# Patient Record
Sex: Female | Born: 1945
Health system: Southern US, Community
[De-identification: ages and names within clinical notes are randomized; demographics above are authoritative.]

## PROBLEM LIST (undated history)

## (undated) DIAGNOSIS — I10 Essential (primary) hypertension: Secondary | ICD-10-CM

## (undated) DIAGNOSIS — K219 Gastro-esophageal reflux disease without esophagitis: Secondary | ICD-10-CM

## (undated) DIAGNOSIS — N289 Disorder of kidney and ureter, unspecified: Secondary | ICD-10-CM

## (undated) DIAGNOSIS — H16229 Keratoconjunctivitis sicca, 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's, unspecified eye: Secondary | ICD-10-CM

## (undated) DIAGNOSIS — M899 Disorder of bone, unspecified: Secondary | ICD-10-CM

## (undated) DIAGNOSIS — Z8719 Personal history of other diseases of the digestive system: Secondary | ICD-10-CM

## (undated) DIAGNOSIS — M542 Cervicalgia: Secondary | ICD-10-CM

## (undated) DIAGNOSIS — E785 Hyperlipidemia, unspecified: Secondary | ICD-10-CM

## (undated) DIAGNOSIS — H409 Unspecified glaucoma: Secondary | ICD-10-CM

## (undated) DIAGNOSIS — Z9289 Personal history of other medical treatment: Secondary | ICD-10-CM

## (undated) DIAGNOSIS — J329 Chronic sinusitis, unspecified: Secondary | ICD-10-CM

## (undated) DIAGNOSIS — Z0000 Encounter for general adult medical examination without abnormal findings: Secondary | ICD-10-CM

## (undated) DIAGNOSIS — M719 Bursopathy, unspecified: Secondary | ICD-10-CM

## (undated) DIAGNOSIS — R739 Hyperglycemia, unspecified: Secondary | ICD-10-CM

## (undated) DIAGNOSIS — R7303 Prediabetes: Secondary | ICD-10-CM

## (undated) DIAGNOSIS — K5730 Diverticulosis of large intestine without perforation or abscess without bleeding: Secondary | ICD-10-CM

## (undated) DIAGNOSIS — J301 Allergic rhinitis due to pollen: Secondary | ICD-10-CM

## (undated) DIAGNOSIS — Z9889 Other specified postprocedural states: Secondary | ICD-10-CM

## (undated) DIAGNOSIS — K449 Diaphragmatic hernia without obstruction or gangrene: Secondary | ICD-10-CM

## (undated) DIAGNOSIS — J069 Acute upper respiratory infection, unspecified: Secondary | ICD-10-CM

## (undated) DIAGNOSIS — Z79899 Other long term (current) drug therapy: Secondary | ICD-10-CM

## (undated) DIAGNOSIS — M2550 Pain in unspecified joint: Secondary | ICD-10-CM

## (undated) DIAGNOSIS — M3500 Sicca syndrome, unspecified: Secondary | ICD-10-CM

## (undated) DIAGNOSIS — M47812 Spondylosis without myelopathy or radiculopathy, cervical region: Secondary | ICD-10-CM

## (undated) DIAGNOSIS — M949 Disorder of cartilage, unspecified: Secondary | ICD-10-CM

## (undated) DIAGNOSIS — M1990 Unspecified osteoarthritis, unspecified site: Secondary | ICD-10-CM

## (undated) DIAGNOSIS — H04129 Dry eye syndrome of unspecified lacrimal gland: Secondary | ICD-10-CM

## (undated) HISTORY — DX: Encounter for general adult medical examination without abnormal findings: Z00.00

## (undated) HISTORY — DX: Other specified postprocedural states: Z98.890

## (undated) HISTORY — DX: Other long term (current) drug therapy: Z79.899

## (undated) HISTORY — DX: Unspecified glaucoma: H40.9

## (undated) HISTORY — DX: Sicca syndrome, unspecified: M35.00

## (undated) HISTORY — PX: BELPHAROPTOSIS REPAIR: SHX369

## (undated) HISTORY — PX: DILATION AND CURETTAGE OF UTERUS: SHX78

## (undated) HISTORY — DX: Hyperglycemia, unspecified: R73.9

## (undated) HISTORY — DX: Personal history of other medical treatment: Z92.89

## (undated) HISTORY — DX: Hyperlipidemia, unspecified: E78.5

## (undated) HISTORY — DX: Bursopathy, unspecified: M71.9

## (undated) HISTORY — PX: BACK SURGERY: SHX140

## (undated) HISTORY — PX: GALLBLADDER SURGERY: SHX652

## (undated) HISTORY — DX: Disorder of bone, unspecified: M94.9

## (undated) HISTORY — DX: Dry eye syndrome of unspecified lacrimal gland: H04.129

## (undated) HISTORY — DX: Keratoconjunctivitis sicca, 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's, unspecified eye: H16.229

## (undated) HISTORY — DX: Disorder of bone, unspecified: M89.9

## (undated) HISTORY — DX: Essential (primary) hypertension: I10

## (undated) HISTORY — DX: Diaphragmatic hernia without obstruction or gangrene: K44.9

## (undated) HISTORY — DX: Chronic sinusitis, unspecified: J32.9

## (undated) HISTORY — DX: Diverticulosis of large intestine without perforation or abscess without bleeding: K57.30

## (undated) HISTORY — DX: Unspecified osteoarthritis, unspecified site: M19.90

## (undated) HISTORY — DX: Acute upper respiratory infection, unspecified: J06.9

## (undated) HISTORY — DX: Cervicalgia: M54.2

## (undated) HISTORY — DX: Spondylosis without myelopathy or radiculopathy, cervical region: M47.812

## (undated) HISTORY — DX: Allergic rhinitis due to pollen: J30.1

## (undated) HISTORY — DX: Disorder of kidney and ureter, unspecified: N28.9

## (undated) HISTORY — DX: Pain in unspecified joint: M25.50

## (undated) HISTORY — PX: NASAL SEPTUM SURGERY: SHX37

---

## 1991-04-13 HISTORY — PX: CHOLECYSTECTOMY: SHX55

## 1997-08-29 ENCOUNTER — Other Ambulatory Visit: Admission: RE | Admit: 1997-08-29 | Discharge: 1997-08-29 | Payer: Self-pay | Admitting: Obstetrics and Gynecology

## 1998-09-01 ENCOUNTER — Other Ambulatory Visit: Admission: RE | Admit: 1998-09-01 | Discharge: 1998-09-01 | Payer: Self-pay | Admitting: Obstetrics and Gynecology

## 1998-09-11 ENCOUNTER — Other Ambulatory Visit: Admission: RE | Admit: 1998-09-11 | Discharge: 1998-09-11 | Payer: Self-pay | Admitting: Obstetrics & Gynecology

## 2000-08-19 ENCOUNTER — Encounter: Admission: RE | Admit: 2000-08-19 | Discharge: 2000-08-19 | Payer: Self-pay | Admitting: Obstetrics & Gynecology

## 2000-08-19 ENCOUNTER — Encounter: Payer: Self-pay | Admitting: Obstetrics & Gynecology

## 2001-09-05 ENCOUNTER — Other Ambulatory Visit: Admission: RE | Admit: 2001-09-05 | Discharge: 2001-09-05 | Payer: Self-pay | Admitting: Obstetrics and Gynecology

## 2002-09-12 ENCOUNTER — Other Ambulatory Visit: Admission: RE | Admit: 2002-09-12 | Discharge: 2002-09-12 | Payer: Self-pay | Admitting: Obstetrics and Gynecology

## 2003-02-26 ENCOUNTER — Encounter: Admission: RE | Admit: 2003-02-26 | Discharge: 2003-02-26 | Payer: Self-pay | Admitting: Obstetrics and Gynecology

## 2004-01-29 ENCOUNTER — Encounter: Admission: RE | Admit: 2004-01-29 | Discharge: 2004-01-29 | Payer: Self-pay | Admitting: Obstetrics & Gynecology

## 2006-07-11 ENCOUNTER — Ambulatory Visit: Payer: Self-pay | Admitting: Gastroenterology

## 2006-07-25 ENCOUNTER — Ambulatory Visit: Payer: Self-pay | Admitting: Gastroenterology

## 2007-05-11 ENCOUNTER — Encounter: Admission: RE | Admit: 2007-05-11 | Discharge: 2007-05-11 | Payer: Self-pay | Admitting: *Deleted

## 2008-10-06 ENCOUNTER — Encounter: Admission: RE | Admit: 2008-10-06 | Discharge: 2008-10-06 | Payer: Self-pay | Admitting: Internal Medicine

## 2010-05-02 ENCOUNTER — Encounter: Payer: Self-pay | Admitting: Obstetrics and Gynecology

## 2011-02-01 ENCOUNTER — Encounter (INDEPENDENT_AMBULATORY_CARE_PROVIDER_SITE_OTHER): Payer: Medicare Other | Admitting: Ophthalmology

## 2011-02-01 DIAGNOSIS — H43819 Vitreous degeneration, unspecified eye: Secondary | ICD-10-CM

## 2011-02-01 DIAGNOSIS — H4420 Degenerative myopia, unspecified eye: Secondary | ICD-10-CM

## 2011-02-01 DIAGNOSIS — H47219 Primary optic atrophy, unspecified eye: Secondary | ICD-10-CM

## 2011-02-01 DIAGNOSIS — Z09 Encounter for follow-up examination after completed treatment for conditions other than malignant neoplasm: Secondary | ICD-10-CM

## 2011-06-15 ENCOUNTER — Encounter: Payer: Self-pay | Admitting: Gastroenterology

## 2012-06-20 ENCOUNTER — Other Ambulatory Visit (HOSPITAL_COMMUNITY): Payer: Self-pay | Admitting: Obstetrics and Gynecology

## 2012-06-20 DIAGNOSIS — Z780 Asymptomatic menopausal state: Secondary | ICD-10-CM

## 2012-06-26 ENCOUNTER — Other Ambulatory Visit (HOSPITAL_COMMUNITY): Payer: Self-pay | Admitting: Obstetrics and Gynecology

## 2012-06-26 ENCOUNTER — Ambulatory Visit (HOSPITAL_COMMUNITY)
Admission: RE | Admit: 2012-06-26 | Discharge: 2012-06-26 | Disposition: A | Discharge status: Home / Self Care | Payer: Medicare Other | Source: Ambulatory Visit | Attending: Obstetrics and Gynecology | Admitting: Obstetrics and Gynecology

## 2012-06-26 DIAGNOSIS — Z13820 Encounter for screening for osteoporosis: Secondary | ICD-10-CM | POA: Insufficient documentation

## 2012-06-26 DIAGNOSIS — Z780 Asymptomatic menopausal state: Secondary | ICD-10-CM | POA: Insufficient documentation

## 2012-07-04 ENCOUNTER — Other Ambulatory Visit: Payer: Self-pay | Admitting: *Deleted

## 2012-07-04 DIAGNOSIS — I10 Essential (primary) hypertension: Secondary | ICD-10-CM

## 2012-07-04 DIAGNOSIS — E785 Hyperlipidemia, unspecified: Secondary | ICD-10-CM

## 2012-07-04 DIAGNOSIS — H04129 Dry eye syndrome of unspecified lacrimal gland: Secondary | ICD-10-CM

## 2012-07-17 ENCOUNTER — Encounter: Payer: Self-pay | Admitting: Gastroenterology

## 2012-07-18 ENCOUNTER — Other Ambulatory Visit: Payer: Self-pay

## 2012-07-18 DIAGNOSIS — I10 Essential (primary) hypertension: Secondary | ICD-10-CM

## 2012-07-18 DIAGNOSIS — E785 Hyperlipidemia, unspecified: Secondary | ICD-10-CM

## 2012-07-18 DIAGNOSIS — H04129 Dry eye syndrome of unspecified lacrimal gland: Secondary | ICD-10-CM

## 2012-07-19 LAB — BASIC METABOLIC PANEL
BUN/Creatinine Ratio: 13 (ref 11–26)
BUN: 11 mg/dL (ref 8–27)
CO2: 25 mmol/L (ref 19–28)
Calcium: 9.8 mg/dL (ref 8.6–10.2)
Chloride: 103 mmol/L (ref 97–108)
Creatinine, Ser: 0.87 mg/dL (ref 0.57–1.00)
GFR calc Af Amer: 80 mL/min/{1.73_m2} (ref >59)
GFR calc non Af Amer: 70 mL/min/{1.73_m2} (ref >59)
Glucose: 89 mg/dL (ref 65–99)
Potassium: 4.2 mmol/L (ref 3.5–5.2)
Sodium: 142 mmol/L (ref 134–144)

## 2012-07-19 LAB — CBC WITH DIFFERENTIAL/PLATELET
Basophils Absolute: 0.1 10*3/uL (ref 0.0–0.2)
Basos: 2 % (ref 0–3)
Eos: 7 % — ABNORMAL HIGH (ref 0–5)
Eosinophils Absolute: 0.5 10*3/uL — ABNORMAL HIGH (ref 0.0–0.4)
HCT: 43.1 % (ref 34.0–46.6)
Hemoglobin: 14.4 g/dL (ref 11.1–15.9)
Immature Grans (Abs): 0 10*3/uL (ref 0.0–0.1)
Immature Granulocytes: 0 % (ref 0–2)
Lymphocytes Absolute: 2 10*3/uL (ref 0.7–3.1)
Lymphs: 28 % (ref 14–46)
MCH: 30.6 pg (ref 26.6–33.0)
MCHC: 33.4 g/dL (ref 31.5–35.7)
MCV: 92 fL (ref 79–97)
Monocytes Absolute: 0.5 10*3/uL (ref 0.1–0.9)
Monocytes: 7 % (ref 4–12)
Neutrophils Absolute: 4.2 10*3/uL (ref 1.4–7.0)
Neutrophils Relative %: 56 % (ref 40–74)
RBC: 4.7 x10E6/uL (ref 3.77–5.28)
RDW: 13.2 % (ref 12.3–15.4)
WBC: 7.4 10*3/uL (ref 3.4–10.8)

## 2012-07-19 LAB — LIPID PANEL
Chol/HDL Ratio: 5.4 ratio units — ABNORMAL HIGH (ref 0.0–4.4)
Cholesterol, Total: 237 mg/dL — ABNORMAL HIGH (ref 100–199)
HDL: 44 mg/dL (ref >39)
LDL Calculated: 158 mg/dL — ABNORMAL HIGH (ref 0–99)
Triglycerides: 177 mg/dL — ABNORMAL HIGH (ref 0–149)
VLDL Cholesterol Cal: 35 mg/dL (ref 5–40)

## 2012-07-20 ENCOUNTER — Encounter: Payer: Self-pay | Admitting: Geriatric Medicine

## 2012-07-20 ENCOUNTER — Encounter (INDEPENDENT_AMBULATORY_CARE_PROVIDER_SITE_OTHER): Payer: Medicare Other | Admitting: Internal Medicine

## 2012-07-20 ENCOUNTER — Encounter: Payer: Self-pay | Admitting: Internal Medicine

## 2012-07-24 ENCOUNTER — Encounter: Payer: Self-pay | Admitting: Internal Medicine

## 2012-07-24 ENCOUNTER — Ambulatory Visit (INDEPENDENT_AMBULATORY_CARE_PROVIDER_SITE_OTHER): Payer: Medicare Other | Admitting: Internal Medicine

## 2012-07-24 VITALS — BP 172/88 | HR 81 | Temp 97.8°F | Resp 16 | Ht 67.0 in | Wt 181.4 lb

## 2012-07-24 DIAGNOSIS — M629 Disorder of muscle, unspecified: Secondary | ICD-10-CM

## 2012-07-24 DIAGNOSIS — M899 Disorder of bone, unspecified: Secondary | ICD-10-CM

## 2012-07-24 DIAGNOSIS — H16223 Keratoconjunctivitis sicca, 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's, bilateral: Secondary | ICD-10-CM

## 2012-07-24 DIAGNOSIS — H409 Unspecified glaucoma: Secondary | ICD-10-CM

## 2012-07-24 DIAGNOSIS — M8580 Other specified disorders of bone density and structure, unspecified site: Secondary | ICD-10-CM

## 2012-07-24 DIAGNOSIS — Z79899 Other long term (current) drug therapy: Secondary | ICD-10-CM

## 2012-07-24 DIAGNOSIS — M949 Disorder of cartilage, unspecified: Secondary | ICD-10-CM

## 2012-07-24 DIAGNOSIS — M7632 Iliotibial band syndrome, left leg: Secondary | ICD-10-CM

## 2012-07-24 DIAGNOSIS — E785 Hyperlipidemia, unspecified: Secondary | ICD-10-CM

## 2012-07-24 DIAGNOSIS — H16229 Keratoconjunctivitis sicca, 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's, unspecified eye: Secondary | ICD-10-CM

## 2012-07-24 DIAGNOSIS — M47812 Spondylosis without myelopathy or radiculopathy, cervical region: Secondary | ICD-10-CM

## 2012-07-24 DIAGNOSIS — J301 Allergic rhinitis due to pollen: Secondary | ICD-10-CM

## 2012-07-24 DIAGNOSIS — M3500 Sicca syndrome, unspecified: Secondary | ICD-10-CM

## 2012-07-24 DIAGNOSIS — I10 Essential (primary) hypertension: Secondary | ICD-10-CM

## 2012-07-24 MED ORDER — LOSARTAN POTASSIUM 50 MG PO TABS: 50.0000 mg | ORAL_TABLET | Freq: Every day | ORAL | Status: DC

## 2012-07-24 NOTE — Patient Instructions (Addendum)
Please write down twice weekly blood pressures after taking medications and bring with you to next visit.    Encourage exercise and healthy diet.    Continue glucosamine.

## 2012-07-24 NOTE — Progress Notes (Signed)
Patient ID: Felicia Hall, female   DOB: Jun 19, 1945, 67 y.o.   MRN: [id number] Code Status: need to discuss next visit  Allergies  Allergen Reactions  . Darvocet (Propoxyphene-Acetaminophen)   . Statins     Chief Complaint  Patient presents with  . Medical Managment of Chronic Issues    HPI: Patient is a 67 y.o. Caucasian female seen in the office today for follow up of high cholesterol.    Stopped statin and fenofibrate because of weakness about 1 month ago.  Also interacted with plaquenil.  Taking cholestoff (sp?) for about a month.  Hips and legs painful.  Dr. Corliss Skains said may be bursitis.  Has been going to neuromuscular massage therapist.  Hip flexors have shortened and working on that has helped so easier and less painful to walk.    Says diet could be better.  Tries to avoid really high risk foods like burgers, fatty, sweet, cholesterol items.  Is officially retired.  Had a very inactive winter.    Does use heat and ice for joints.  Also used a topical gel (suspect voltaren) w/ benefit but irritated skin.  Cannot take steroids with glaucoma.    Review of Systems:  Review of Systems  Constitutional: Positive for malaise/fatigue. Negative for fever and chills.  HENT: Negative for congestion.   Eyes:       Glaucoma  Respiratory: Negative for shortness of breath.   Cardiovascular: Negative for chest pain.  Gastrointestinal: Negative for heartburn and abdominal pain.  Genitourinary: Negative for dysuria.  Musculoskeletal: Positive for myalgias and joint pain. Negative for falls.  Skin: Negative for rash.  Neurological: Positive for weakness. Negative for dizziness and headaches.  Endo/Heme/Allergies: Negative for polydipsia.  Psychiatric/Behavioral: Positive for depression. Negative for memory loss.       Denies, but mood seems worse since retirement    Past Medical History  Diagnosis Date  . 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 Encounter for  long-term (current) use of other medications   . Routine general medical examination at a health care facility   . Tear film insufficiency, unspecified   . Unspecified disorder of kidney and ureter   . Cervical spondylosis without myelopathy   . Cervicalgia   . Disorder of bone and cartilage, unspecified   . Acute upper respiratory infections of unspecified site   . Allergic rhinitis due to pollen   . Other and unspecified hyperlipidemia   . 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 Benign essential hypertension   . Diaphragmatic hernia without mention of obstruction or gangrene   . Diverticulosis of colon (without mention of hemorrhage)   . Pain in joint, site unspecified   . Hyperlipidemia LDL goal < 100    Past Surgical History  Procedure Laterality Date  . Cholecystectomy  1993   Social History:   reports that she has never smoked. She does not have any smokeless tobacco history on file. She reports that she does not drink alcohol or use illicit drugs.  Family History  Problem Relation Age of Onset  . Heart attack Mother   . Pancreatitis Father     Medications: Patient's Medications  New Prescriptions   No medications on file  Previous Medications   ASPIRIN 81 MG TABLET    Take one tablet once daily   CALCIUM-VITAMIN D (OSCAL WITH D) 500-200 MG-UNIT PER TABLET    Take one tablet once daily for bones   CHOLECALCIFEROL (VITAMIN D) 1000 UNITS TABLET    Take  one tablet once daily   CHOLINE FENOFIBRATE (FENOFIBRIC ACID) 135 MG CPDR       CO-ENZYME Q-10 100 MG CAPS    Take by mouth. Take one tablet once daily   HYDROXYCHLOROQUINE (PLAQUENIL) 200 MG TABLET    Take by mouth daily. Take one tablet twice daily   MISC NATURAL PRODUCTS (OSTEO BI-FLEX JOINT SHIELD PO)    Take by mouth. Take one tablet twice daily   MULTIPLE VITAMINS-MINERALS (CENTRUM SILVER PO)    Take by mouth. Take one tablet once daily   PLANT STEROLS AND STANOLS (CHOLEST OFF PO)    Take by mouth. Take one tablet four  times daily for cholesterol   TIMOLOL (BETIMOL) 0.5 % OPHTHALMIC SOLUTION    One drop each eye once daily for glaucoma   TIMOLOL (TIMOPTIC-XR) 0.5 % OPHTHALMIC GEL-FORMING      Modified Medications   Modified Medication Previous Medication   LOSARTAN (COZAAR) 50 MG TABLET losartan (COZAAR) 50 MG tablet      Take 1 tablet (50 mg total) by mouth daily.    Take 50 mg by mouth daily.  Discontinued Medications   No medications on file   Physical Exam: usually bp 120s/60s except 133/78 at home Filed Vitals:   07/24/12 1405  BP: 172/88  Pulse: 81  Temp: 97.8 F (36.6 C)  TempSrc: Oral  Resp: 16  Height: 5\' 7"  (1.702 m)  Weight: 181 lb 6.4 oz (82.283 kg)  Physical Exam  Constitutional: She is oriented to person, place, and time. She appears well-developed and well-nourished. No distress.  Caucasian female  HENT:  Head: Normocephalic and atraumatic.  Eyes: Pupils are equal, round, and reactive to light.  Cardiovascular: Normal rate, regular rhythm, normal heart sounds and intact distal pulses.   Pulmonary/Chest: Effort normal and breath sounds normal. No respiratory distress.  Abdominal: Soft. Bowel sounds are normal. She exhibits no distension and no mass. There is no tenderness.  Musculoskeletal: Normal range of motion. She exhibits no tenderness.  Neurological: She is alert and oriented to person, place, and time.  Skin: Skin is warm and dry.  Psychiatric:  Somewhat flat affect   Labs reviewed: 02/10/2010  CBC: wbc 6.9, rbc 4.42, Hemoglobin 13.5 CMP: glucose 90, BUN 18, Creatinine 1.00 LIpid: Cholesterol 192, Triglycerides 150, HDL 40, LDL 122 07/19/2011 CMP Glucose 87 Bun 16 Creatinine 0.98  Vitamin D 25 Hydroxy 37.3  01/18/2012  BMP: glucose 81, BUN 12, Creatinine 1.00 Lipid: Cholesterol 189, Triglycerides 128, HDL 44, LDL 119 Basic Metabolic Panel:  Recent Labs  91/47/82 0826  NA 142  K 4.2  CL 103  CO2 25  GLUCOSE 89  BUN 11  CREATININE 0.87  CALCIUM 9.8   CBC:  Recent Labs  07/18/12 0826  WBC 7.4  NEUTROABS 4.2  HGB 14.4  HCT 43.1  MCV 92   Lipid Panel:  Recent Labs  07/18/12 0826  HDL 44  LDLCALC 158*  TRIG 177*  CHOLHDL 5.4*   Past Procedures: 2008 Left Breast Ultrasound Normal  03/14/2007 Mammogram Normal  2008 Pap Smear Normal done @ [facility]  05/11/2007 Bone Density Normal  10/06/2008 MRI Cervical spine-degenerative cervical spondylosis with disc disease and facet disease; shallow central disc protrusion and C3-4; disc disease at C4-5 and C5-6 with spinal stenosis, disc protrusion at T1-2 2008 colonoscope[+ve family h/o colon polyps ]--nl 10/2010 Mammogram Normal   colonoscopy   Dr Claudette Head every 5 years- due to family history of colon polyps   04/2011  Pap smear Normal   by  Debbora Dus NP   Assessment/Plan Benign essential hypertension BP high.  Always upset about waiting.  Is not adherent with diet and has been unable to exercise much due to statin-induced myopathy which she also experienced with fenofibrate so this has been stopped.  Advised to monitor bp outpatient, work on diet and exercise.  Cont losartan and baby asa.  Hyperlipidemia LDL goal < 100 LDL very high, TG also very high.  Did not tolerate statin or fenofibrate.  Supplement she is taking seems to be ineffective so she will stop it, as well.  Encouraged diet and exercise.  I can't imagine she will tolerate niacin and she is hesitant to try anything right now.  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  Limits her medication options for arthritis treatment.    Cervical spondylosis without myelopathy Has chronic neck pain.  On plaquenil and gets regular labs through rheumatology, as well.  Due for labs before her next visit with me.  Allergic rhinitis due to pollen Has been avoiding exposure as much as possible.  Stable at this point  Disorder of bone and cartilage, unspecified Gets her bone densities and mammograms through  her gyn.  Last was 3/14.  Is taking calcium with vitamin D  Keratoconjunctivitis sicca, 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's Drinks a lot of water.  Has tried biotene mouthwashes and gels, but they do not offer much help to her dry mouth.  Eyes chronically dry.  Also follows with rheumatology for this.   Labs/tests ordered:  Flp, bmp, vit D, cbc before f/u appt in 3 mos

## 2012-08-13 ENCOUNTER — Encounter: Payer: Self-pay | Admitting: Internal Medicine

## 2012-08-13 DIAGNOSIS — I10 Essential (primary) hypertension: Secondary | ICD-10-CM | POA: Insufficient documentation

## 2012-08-13 DIAGNOSIS — E782 Mixed hyperlipidemia: Secondary | ICD-10-CM | POA: Insufficient documentation

## 2012-08-13 DIAGNOSIS — H16229 Keratoconjunctivitis sicca, 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's, unspecified eye: Secondary | ICD-10-CM | POA: Insufficient documentation

## 2012-08-13 DIAGNOSIS — M899 Disorder of bone, unspecified: Secondary | ICD-10-CM | POA: Insufficient documentation

## 2012-08-13 DIAGNOSIS — H409 Unspecified glaucoma: Secondary | ICD-10-CM | POA: Insufficient documentation

## 2012-08-13 DIAGNOSIS — M949 Disorder of cartilage, unspecified: Secondary | ICD-10-CM | POA: Insufficient documentation

## 2012-08-13 DIAGNOSIS — J301 Allergic rhinitis due to pollen: Secondary | ICD-10-CM | POA: Insufficient documentation

## 2012-08-13 DIAGNOSIS — Z79899 Other long term (current) drug therapy: Secondary | ICD-10-CM | POA: Insufficient documentation

## 2012-08-13 DIAGNOSIS — M47812 Spondylosis without myelopathy or radiculopathy, cervical region: Secondary | ICD-10-CM | POA: Insufficient documentation

## 2012-08-13 NOTE — Assessment & Plan Note (Signed)
Drinks a lot of water.  Has tried biotene mouthwashes and gels, but they do not offer much help to her dry mouth.  Eyes chronically dry.  Also follows with rheumatology for this.

## 2012-08-13 NOTE — Assessment & Plan Note (Signed)
Has chronic neck pain.  On plaquenil and gets regular labs through rheumatology, as well.  Due for labs before her next visit with me.

## 2012-08-13 NOTE — Progress Notes (Signed)
Pt left before being seen

## 2012-08-13 NOTE — Assessment & Plan Note (Signed)
Follows with ophthalmology.  Limits her medication options for arthritis treatment.

## 2012-08-13 NOTE — Assessment & Plan Note (Signed)
LDL very high, TG also very high.  Did not tolerate statin or fenofibrate.  Supplement she is taking seems to be ineffective so she will stop it, as well.  Encouraged diet and exercise.  I can't imagine she will tolerate niacin and she is hesitant to try anything right now.

## 2012-08-13 NOTE — Assessment & Plan Note (Signed)
Gets her bone densities and mammograms through her gyn.  Last was 3/14.  Is taking calcium with vitamin D

## 2012-08-13 NOTE — Assessment & Plan Note (Addendum)
BP high.  Always upset about waiting.  Is not adherent with diet and has been unable to exercise much due to statin-induced myopathy which she also experienced with fenofibrate so this has been stopped.  Advised to monitor bp outpatient, work on diet and exercise.  Cont losartan and baby asa.

## 2012-08-13 NOTE — Assessment & Plan Note (Signed)
Has been avoiding exposure as much as possible.  Stable at this point

## 2012-10-10 ENCOUNTER — Emergency Department (HOSPITAL_COMMUNITY)
Admission: EM | Admit: 2012-10-10 | Discharge: 2012-10-10 | Disposition: A | Discharge status: Home / Self Care | Payer: Medicare Other | Attending: Emergency Medicine | Admitting: Emergency Medicine

## 2012-10-10 ENCOUNTER — Emergency Department (HOSPITAL_COMMUNITY): Payer: Medicare Other

## 2012-10-10 ENCOUNTER — Encounter (HOSPITAL_COMMUNITY): Payer: Self-pay | Admitting: Emergency Medicine

## 2012-10-10 DIAGNOSIS — H409 Unspecified glaucoma: Secondary | ICD-10-CM | POA: Insufficient documentation

## 2012-10-10 DIAGNOSIS — I10 Essential (primary) hypertension: Secondary | ICD-10-CM | POA: Insufficient documentation

## 2012-10-10 DIAGNOSIS — R112 Nausea with vomiting, unspecified: Secondary | ICD-10-CM

## 2012-10-10 DIAGNOSIS — Z8719 Personal history of other diseases of the digestive system: Secondary | ICD-10-CM | POA: Insufficient documentation

## 2012-10-10 DIAGNOSIS — Z8669 Personal history of other diseases of the nervous system and sense organs: Secondary | ICD-10-CM | POA: Insufficient documentation

## 2012-10-10 DIAGNOSIS — M47812 Spondylosis without myelopathy or radiculopathy, cervical region: Secondary | ICD-10-CM | POA: Insufficient documentation

## 2012-10-10 DIAGNOSIS — Z87448 Personal history of other diseases of urinary system: Secondary | ICD-10-CM | POA: Insufficient documentation

## 2012-10-10 DIAGNOSIS — Z79899 Other long term (current) drug therapy: Secondary | ICD-10-CM | POA: Insufficient documentation

## 2012-10-10 DIAGNOSIS — Z8639 Personal history of other endocrine, nutritional and metabolic disease: Secondary | ICD-10-CM | POA: Insufficient documentation

## 2012-10-10 DIAGNOSIS — Z8739 Personal history of other diseases of the musculoskeletal system and connective tissue: Secondary | ICD-10-CM | POA: Insufficient documentation

## 2012-10-10 DIAGNOSIS — R1031 Right lower quadrant pain: Secondary | ICD-10-CM | POA: Insufficient documentation

## 2012-10-10 DIAGNOSIS — Z8709 Personal history of other diseases of the respiratory system: Secondary | ICD-10-CM | POA: Insufficient documentation

## 2012-10-10 DIAGNOSIS — Z862 Personal history of diseases of the blood and blood-forming organs and certain disorders involving the immune mechanism: Secondary | ICD-10-CM | POA: Insufficient documentation

## 2012-10-10 DIAGNOSIS — Z7982 Long term (current) use of aspirin: Secondary | ICD-10-CM | POA: Insufficient documentation

## 2012-10-10 LAB — CBC WITH DIFFERENTIAL/PLATELET
Basophils Absolute: 0 10*3/uL (ref 0.0–0.1)
Basophils Relative: 0 % (ref 0–1)
Eosinophils Absolute: 0 10*3/uL (ref 0.0–0.7)
Eosinophils Relative: 0 % (ref 0–5)
HCT: 45.9 % (ref 36.0–46.0)
Hemoglobin: 16.3 g/dL — ABNORMAL HIGH (ref 12.0–15.0)
Lymphocytes Relative: 2 % — ABNORMAL LOW (ref 12–46)
Lymphs Abs: 0.3 10*3/uL — ABNORMAL LOW (ref 0.7–4.0)
MCH: 31.5 pg (ref 26.0–34.0)
MCHC: 35.5 g/dL (ref 30.0–36.0)
MCV: 88.6 fL (ref 78.0–100.0)
Monocytes Absolute: 0.4 10*3/uL (ref 0.1–1.0)
Monocytes Relative: 3 % (ref 3–12)
Neutro Abs: 13.5 10*3/uL — ABNORMAL HIGH (ref 1.7–7.7)
Neutrophils Relative %: 95 % — ABNORMAL HIGH (ref 43–77)
Platelets: 248 10*3/uL (ref 150–400)
RBC: 5.18 MIL/uL — ABNORMAL HIGH (ref 3.87–5.11)
RDW: 12.6 % (ref 11.5–15.5)
WBC: 14.1 10*3/uL — ABNORMAL HIGH (ref 4.0–10.5)

## 2012-10-10 LAB — COMPREHENSIVE METABOLIC PANEL
ALT: 23 U/L (ref 0–35)
AST: 24 U/L (ref 0–37)
Albumin: 4 g/dL (ref 3.5–5.2)
Alkaline Phosphatase: 63 U/L (ref 39–117)
BUN: 15 mg/dL (ref 6–23)
CO2: 22 mEq/L (ref 19–32)
Calcium: 9.2 mg/dL (ref 8.4–10.5)
Chloride: 100 mEq/L (ref 96–112)
Creatinine, Ser: 0.7 mg/dL (ref 0.50–1.10)
GFR calc Af Amer: >90 mL/min (ref >90)
GFR calc non Af Amer: 88 mL/min — ABNORMAL LOW (ref >90)
Glucose, Bld: 142 mg/dL — ABNORMAL HIGH (ref 70–99)
Potassium: 4 mEq/L (ref 3.5–5.1)
Sodium: 134 mEq/L — ABNORMAL LOW (ref 135–145)
Total Bilirubin: 0.9 mg/dL (ref 0.3–1.2)
Total Protein: 7.3 g/dL (ref 6.0–8.3)

## 2012-10-10 LAB — LIPASE, BLOOD: Lipase: 16 U/L (ref 11–59)

## 2012-10-10 MED ORDER — IOHEXOL 300 MG/ML  SOLN
100.0000 mL | Freq: Once | INTRAMUSCULAR | Status: AC | PRN
  Administered 2012-10-10: 100 mL via INTRAVENOUS

## 2012-10-10 MED ORDER — ONDANSETRON HCL 4 MG/2ML IJ SOLN
4.0000 mg | Freq: Once | INTRAMUSCULAR | Status: AC
  Administered 2012-10-10: 4 mg via INTRAVENOUS
  Filled 2012-10-10: qty 2

## 2012-10-10 MED ORDER — SODIUM CHLORIDE 0.9 % IV BOLUS (SEPSIS)
1000.0000 mL | Freq: Once | INTRAVENOUS | Status: AC
  Administered 2012-10-10: 1000 mL via INTRAVENOUS

## 2012-10-10 MED ORDER — ONDANSETRON HCL 4 MG PO TABS
4.0000 mg | ORAL_TABLET | Freq: Four times a day (QID) | ORAL | Status: DC
Start: 2012-10-10 — End: 2012-10-27

## 2012-10-10 NOTE — ED Notes (Addendum)
Per EMS: pt c/o n/v x12h. RLQ pain earlier this morning. No pain at this time.  Still has appendix. No gall bladder. ems gave 4mg  zofran with complete relief of nausea. Vomiting 7x in last 24 hours. Denies diarrhea, headache. Did not take anything at home to treat sx. Had chills last night. No chills at this time.

## 2012-10-10 NOTE — ED Provider Notes (Signed)
History    CSN: [id number] Arrival date & time 10/10/12  1114  First MD Initiated Contact with Patient 10/10/12 1124     Chief Complaint  Patient presents with  . Nausea  . Emesis    HPI  Per EMS: pt c/o n/v x12h. RLQ pain earlier this morning. No pain at this time. Still has appendix. No gall bladder. ems gave 4mg  zofran with complete relief of nausea. Vomiting 7x in last 24 hours. Denies diarrhea, headache. Did not take anything at home to treat sx. Had chills last night. No chills at this time  Past Medical History  Diagnosis Date  . 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 Encounter for long-term (current) use of other medications   . Routine general medical examination at a health care facility   . Tear film insufficiency, unspecified   . Unspecified disorder of kidney and ureter   . Cervical spondylosis without myelopathy   . Cervicalgia   . Disorder of bone and cartilage, unspecified   . Acute upper respiratory infections of unspecified site   . Allergic rhinitis due to pollen   . Other and unspecified hyperlipidemia   . 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 Benign essential hypertension   . Diaphragmatic hernia without mention of obstruction or gangrene   . Diverticulosis of colon (without mention of hemorrhage)   . Pain in joint, site unspecified   . Hyperlipidemia LDL goal < 100    Past Surgical History  Procedure Laterality Date  . Cholecystectomy  1993   Family History  Problem Relation Age of Onset  . Heart attack Mother   . Pancreatitis Father    History  Substance Use Topics  . Smoking status: Never Smoker   . Smokeless tobacco: Not on file  . Alcohol Use: No   OB History   Grav Para Term Preterm Abortions TAB SAB Ect Mult Living                 Review of Systems All other systems reviewed and are negative Allergies  Statins and Darvocet  Home Medications   Current Outpatient Rx  Name  Route  Sig  Dispense  Refill  . aspirin  81 MG tablet   Oral   Take 81 mg by mouth daily. Take one tablet once daily         . calcium citrate-vitamin D (CITRACAL+D) 315-200 MG-UNIT per tablet   Oral   Take 1 tablet by mouth 4 (four) times daily.         . cholecalciferol (VITAMIN D) 1000 UNITS tablet   Oral   Take 1,000 Units by mouth daily. Take one tablet once daily         . Co-Enzyme Q-10 100 MG CAPS   Oral   Take by mouth. Take one tablet once daily         . hydroxychloroquine (PLAQUENIL) 200 MG tablet   Oral   Take 200 mg by mouth 2 (two) times daily. Take one tablet twice daily         . losartan (COZAAR) 50 MG tablet   Oral   Take 1 tablet (50 mg total) by mouth daily.   30 tablet   3   . Misc Natural Products (OSTEO BI-FLEX JOINT SHIELD PO)   Oral   Take 1 tablet by mouth daily.          . Misc Natural Products (TART CHERRY ADVANCED PO)   Oral  Take 1 tablet by mouth daily.         . Multiple Vitamins-Minerals (CENTRUM SILVER PO)   Oral   Take 0.5 tablets by mouth daily.          Marland Kitchen omeprazole (PRILOSEC) 20 MG capsule   Oral   Take 20 mg by mouth daily.         . Plant Sterols and Stanols (CHOLEST OFF PO)   Oral   Take 1 tablet by mouth 4 (four) times daily.          . timolol (TIMOPTIC-XR) 0.5 % ophthalmic gel-forming   Both Eyes   Place 1 drop into both eyes daily.          . ondansetron (ZOFRAN) 4 MG tablet   Oral   Take 1 tablet (4 mg total) by mouth every 6 (six) hours.   12 tablet   0    BP 138/57  Pulse 95  Temp(Src) 98.4 F (36.9 C) (Oral)  SpO2 93% Physical Exam  Nursing note and vitals reviewed. Constitutional: She is oriented to person, place, and time. She appears well-developed and well-nourished. No distress.  HENT:  Head: Normocephalic and atraumatic.  Eyes: Pupils are equal, round, and reactive to light.  Neck: Normal range of motion.  Cardiovascular: Normal rate and intact distal pulses.   Pulmonary/Chest: No respiratory distress.    Abdominal: Normal appearance. She exhibits no distension. There is tenderness.    Pain to palpation where indicated  Musculoskeletal: Normal range of motion.  Neurological: She is alert and oriented to person, place, and time. She has normal strength. No cranial nerve deficit. GCS eye subscore is 4. GCS verbal subscore is 5. GCS motor subscore is 6.  Skin: Skin is warm and dry. No rash noted.  Psychiatric: She has a normal mood and affect. Her behavior is normal.    ED Course  Procedures (including critical care time)  Medications  ondansetron (ZOFRAN) injection 4 mg (4 mg Intravenous Given 10/10/12 1404)  iohexol (OMNIPAQUE) 300 MG/ML solution 100 mL (100 mLs Intravenous Contrast Given 10/10/12 1413)  sodium chloride 0.9 % bolus 1,000 mL (1,000 mLs Intravenous New Bag/Given 10/10/12 1411)   After treatment in the ED the patient feels back to baseline and wants to go home. Labs Reviewed  COMPREHENSIVE METABOLIC PANEL - Abnormal; Notable for the following:    Sodium 134 (*)    Glucose, Bld 142 (*)    GFR calc non Af Amer 88 (*)    All other components within normal limits  CBC WITH DIFFERENTIAL - Abnormal; Notable for the following:    WBC 14.1 (*)    RBC 5.18 (*)    Hemoglobin 16.3 (*)    Neutrophils Relative % 95 (*)    Neutro Abs 13.5 (*)    Lymphocytes Relative 2 (*)    Lymphs Abs 0.3 (*)    All other components within normal limits  LIPASE, BLOOD   Ct Abdomen Pelvis W Contrast  10/10/2012   *RADIOLOGY REPORT*  Clinical Data: Nausea and vomiting.  Right lower quadrant abdominal pain.  CT ABDOMEN AND PELVIS WITH CONTRAST  Technique:  Multidetector CT imaging of the abdomen and pelvis was performed following the standard protocol during bolus administration of intravenous contrast.  Contrast: OMNIPAQUE IOHEXOL 300 MG/ML  SOLN  Comparison: None.  Findings: Gallbladder has been removed.  Diffuse hepatic steatosis without focal lesions.  Biliary tree is normal.  Spleen, pancreas,  right adrenal gland and kidneys are  normal except for a 9 mm cyst in the lateral aspect of the midportion of the left kidney.  2.6 cm cyst on the left ovary. 11 mm low density lesion in the left adrenal gland consistent with a small benign adenoma.  Normal right ovary uterus.  No free air or free fluid.  The bowel appears normal including the terminal ileum and small appendix.  No acute osseous abnormality.  IMPRESSION: Hepatic steatosis. Tiny left adrenal adenoma.  No acute abnormalities.   Original Report Authenticated By: Francene Boyers, M.D.   1. Nausea & vomiting     MDM    Nelia Shi, MD 10/10/12 1459

## 2012-10-25 ENCOUNTER — Ambulatory Visit: Payer: Medicare Other

## 2012-10-25 DIAGNOSIS — I10 Essential (primary) hypertension: Secondary | ICD-10-CM

## 2012-10-25 DIAGNOSIS — E785 Hyperlipidemia, unspecified: Secondary | ICD-10-CM

## 2012-10-25 DIAGNOSIS — M8580 Other specified disorders of bone density and structure, unspecified site: Secondary | ICD-10-CM

## 2012-10-26 LAB — CBC WITH DIFFERENTIAL/PLATELET
Basophils Absolute: 0.1 10*3/uL (ref 0.0–0.2)
Basos: 2 % (ref 0–3)
Eos: 7 % — ABNORMAL HIGH (ref 0–5)
Eosinophils Absolute: 0.5 10*3/uL — ABNORMAL HIGH (ref 0.0–0.4)
HCT: 45.6 % (ref 34.0–46.6)
Hemoglobin: 14.7 g/dL (ref 11.1–15.9)
Immature Grans (Abs): 0 10*3/uL (ref 0.0–0.1)
Immature Granulocytes: 0 % (ref 0–2)
Lymphocytes Absolute: 1.7 10*3/uL (ref 0.7–3.1)
Lymphs: 23 % (ref 14–46)
MCH: 29.8 pg (ref 26.6–33.0)
MCHC: 32.2 g/dL (ref 31.5–35.7)
MCV: 93 fL (ref 79–97)
Monocytes Absolute: 0.7 10*3/uL (ref 0.1–0.9)
Monocytes: 9 % (ref 4–12)
Neutrophils Absolute: 4.4 10*3/uL (ref 1.4–7.0)
Neutrophils Relative %: 59 % (ref 40–74)
RBC: 4.93 x10E6/uL (ref 3.77–5.28)
RDW: 13 % (ref 12.3–15.4)
WBC: 7.4 10*3/uL (ref 3.4–10.8)

## 2012-10-26 LAB — BASIC METABOLIC PANEL
BUN/Creatinine Ratio: 11 (ref 11–26)
BUN: 10 mg/dL (ref 8–27)
CO2: 22 mmol/L (ref 18–29)
Calcium: 10.3 mg/dL — ABNORMAL HIGH (ref 8.6–10.2)
Chloride: 102 mmol/L (ref 97–108)
Creatinine, Ser: 0.89 mg/dL (ref 0.57–1.00)
GFR calc Af Amer: 78 mL/min/{1.73_m2} (ref >59)
GFR calc non Af Amer: 67 mL/min/{1.73_m2} (ref >59)
Glucose: 94 mg/dL (ref 65–99)
Potassium: 4.2 mmol/L (ref 3.5–5.2)
Sodium: 139 mmol/L (ref 134–144)

## 2012-10-26 LAB — LIPID PANEL
Chol/HDL Ratio: 5.1 ratio units — ABNORMAL HIGH (ref 0.0–4.4)
Cholesterol, Total: 192 mg/dL (ref 100–199)
HDL: 38 mg/dL — ABNORMAL LOW (ref >39)
LDL Calculated: 116 mg/dL — ABNORMAL HIGH (ref 0–99)
Triglycerides: 191 mg/dL — ABNORMAL HIGH (ref 0–149)
VLDL Cholesterol Cal: 38 mg/dL (ref 5–40)

## 2012-10-26 LAB — VITAMIN D 25 HYDROXY (VIT D DEFICIENCY, FRACTURES): Vit D, 25-Hydroxy: 37.2 ng/mL (ref 30.0–100.0)

## 2012-10-27 ENCOUNTER — Encounter: Payer: Self-pay | Admitting: Internal Medicine

## 2012-10-27 ENCOUNTER — Ambulatory Visit (INDEPENDENT_AMBULATORY_CARE_PROVIDER_SITE_OTHER): Payer: Medicare Other | Admitting: Internal Medicine

## 2012-10-27 VITALS — BP 148/88 | HR 78 | Temp 97.7°F | Resp 14 | Ht 67.0 in | Wt 179.4 lb

## 2012-10-27 DIAGNOSIS — E785 Hyperlipidemia, unspecified: Secondary | ICD-10-CM

## 2012-10-27 DIAGNOSIS — I10 Essential (primary) hypertension: Secondary | ICD-10-CM

## 2012-10-27 DIAGNOSIS — M3500 Sicca syndrome, unspecified: Secondary | ICD-10-CM | POA: Insufficient documentation

## 2012-10-27 DIAGNOSIS — H409 Unspecified glaucoma: Secondary | ICD-10-CM

## 2012-10-27 HISTORY — DX: Sjogren syndrome, unspecified: M35.00

## 2012-10-27 NOTE — Assessment & Plan Note (Signed)
Pt has refused medications for her cholesterol.  Did not tolerate statins.  Is working hard to improve her diet especially starchy foods due to elevated triglycerides.  Is trying to exercise more.

## 2012-10-27 NOTE — Assessment & Plan Note (Addendum)
Well controlled at home in the 110s-130s systolic, typically 120s.  Elevated here with white coat syndrome.  Cont cozaar.

## 2012-10-27 NOTE — Progress Notes (Signed)
Patient ID: Felicia Hall, female   DOB: April 15, 1945, 67 y.o.   MRN: [id number] Location:  [facility]  Code Status: need to address, full code at this point  Allergies  Allergen Reactions  . Statins Other (See Comments)    Muscle problems  . Darvocet (Propoxyphene-Acetaminophen) Rash    Chief Complaint  Patient presents with  . Medical Managment of Chronic Issues    HPI: Patient is a 67 y.o. white female seen in the office today for med mgt of chronic diseases.    7/1 in ED with GI bug.  Had vomited so much that she was dehydrated.  Gets easily dehydrated with her Sjogren's.  CT abdomen showed she has a fatty liver.    Going to PT twice a week via rheumatology for left hip and shoulder.  Doing some light workouts for that.  Some days worse and some days better after therapy.  Fluctuates quite a bit.    Taking some extra supplements as natural antiinflammatories (tart cherry, turmeric and eventually ginger).    Has been having more visual problems.  Sees ophtho in 2 wks.  Worried it's related to the plaquenil.    Is doing more exercise on stationary bike since taking osteobiflex.    Starches are some of her favorite foods so she has difficulty cutting back on them and they are the easiest to eat.    Review of Systems:  Review of Systems  Constitutional: Positive for malaise/fatigue. Negative for fever, chills, weight loss and diaphoresis.  HENT: Negative for congestion.        Dry mouth  Eyes: Negative for blurred vision.       Dry eyes, glaucoma, worsening vision lately  Respiratory: Negative for shortness of breath.   Cardiovascular: Negative for chest pain.  Gastrointestinal: Negative for abdominal pain.  Genitourinary: Negative for dysuria.  Musculoskeletal: Positive for myalgias and joint pain. Negative for falls.  Skin: Negative for rash.  Neurological: Positive for weakness. Negative for dizziness and headaches.   Psychiatric/Behavioral: Negative for memory loss.     Past Medical History  Diagnosis Date  . 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 Encounter for long-term (current) use of other medications   . Routine general medical examination at a health care facility   . Tear film insufficiency, unspecified   . Unspecified disorder of kidney and ureter   . Cervical spondylosis without myelopathy   . Cervicalgia   . Disorder of bone and cartilage, unspecified   . Acute upper respiratory infections of unspecified site   . Allergic rhinitis due to pollen   . Other and unspecified hyperlipidemia   . 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 Benign essential hypertension   . Diaphragmatic hernia without mention of obstruction or gangrene   . Diverticulosis of colon (without mention of hemorrhage)   . Pain in joint, site unspecified   . Hyperlipidemia LDL goal < 100   . Sjogren's syndrome 10/27/2012    Past Surgical History  Procedure Laterality Date  . Cholecystectomy  1993    Social History:   reports that she has never smoked. She does not have any smokeless tobacco history on file. She reports that she does not drink alcohol or use illicit drugs.  Family History  Problem Relation Age of Onset  . Heart attack Mother   . Pancreatitis Father     Medications: Patient's Medications  New Prescriptions   No medications on file  Previous  Medications   ASPIRIN 81 MG TABLET    Take 81 mg by mouth daily. Take one tablet once daily   CALCIUM CITRATE-VITAMIN D (CITRACAL+D) 315-200 MG-UNIT PER TABLET    Take 1 tablet by mouth 4 (four) times daily.   CHOLECALCIFEROL (VITAMIN D) 1000 UNITS TABLET    Take 1,000 Units by mouth daily. Take one tablet once daily   CO-ENZYME Q-10 100 MG CAPS    Take by mouth. Take one tablet once daily   HYDROXYCHLOROQUINE (PLAQUENIL) 200 MG TABLET    Take 200 mg by mouth 2 (two) times daily. Take one tablet twice daily   LOSARTAN (COZAAR) 50 MG TABLET     Take 1 tablet (50 mg total) by mouth daily.   MISC NATURAL PRODUCTS (OSTEO BI-FLEX JOINT SHIELD PO)    Take 1 tablet by mouth daily.    MISC NATURAL PRODUCTS (TART CHERRY ADVANCED PO)    Take 1 tablet by mouth daily.   MULTIPLE VITAMINS-MINERALS (CENTRUM SILVER PO)    Take 0.5 tablets by mouth daily.    OMEPRAZOLE (PRILOSEC) 20 MG CAPSULE    Take 20 mg by mouth daily.   PLANT STEROLS AND STANOLS (CHOLEST OFF PO)    Take 1 tablet by mouth 4 (four) times daily.    TIMOLOL (TIMOPTIC-XR) 0.5 % OPHTHALMIC GEL-FORMING    Place 1 drop into both eyes daily.    TURMERIC PO    Take by mouth once.  Modified Medications   No medications on file  Discontinued Medications   ONDANSETRON (ZOFRAN) 4 MG TABLET    Take 1 tablet (4 mg total) by mouth every 6 (six) hours.     Physical Exam: Filed Vitals:   10/27/12 0828  BP: 148/88  Pulse: 78  Temp: 97.7 F (36.5 C)  TempSrc: Oral  Resp: 14  Height: 5\' 7"  (1.702 m)  Weight: 179 lb 6.4 oz (81.375 kg)  Physical Exam  Constitutional: She is oriented to person, place, and time. She appears well-developed and well-nourished.  Central obesity  HENT:  Head: Normocephalic and atraumatic.  Cardiovascular: Normal rate, regular rhythm, normal heart sounds and intact distal pulses.   Pulmonary/Chest: Effort normal and breath sounds normal.  Abdominal: Soft. Bowel sounds are normal. She exhibits no distension. There is no tenderness.  Musculoskeletal: Normal range of motion.  Neurological: She is alert and oriented to person, place, and time.  Skin: Skin is warm and dry. There is pallor.   Labs reviewed: Basic Metabolic Panel:  Recent Labs  47/82/95 0826 10/10/12 1143 10/25/12 0819  NA 142 134* 139  K 4.2 4.0 4.2  CL 103 100 102  CO2 25 22 22   GLUCOSE 89 142* 94  BUN 11 15 10   CREATININE 0.87 0.70 0.89  CALCIUM 9.8 9.2 10.3*   Liver Function Tests:  Recent Labs  10/10/12 1143  AST 24  ALT 23  ALKPHOS 63  BILITOT 0.9  PROT 7.3  ALBUMIN  4.0    Recent Labs  10/10/12 1143  LIPASE 16  CBC:  Recent Labs  07/18/12 0826 10/10/12 1143 10/25/12 0819  WBC 7.4 14.1* 7.4  NEUTROABS 4.2 13.5* 4.4  HGB 14.4 16.3* 14.7  HCT 43.1 45.9 45.6  MCV 92 88.6 93  PLT  --  248  --    Lipid Panel:  Recent Labs  07/18/12 0826 10/25/12 0819  HDL 44 38*  LDLCALC 158* 116*  TRIG 177* 191*  CHOLHDL 5.4* 5.1*   Assessment/Plan Benign essential hypertension Well  controlled at home in the 110s-130s systolic, typically 120s.  Elevated here with white coat syndrome.  Cont cozaar.  Hyperlipidemia LDL goal < 100 Pt has refused medications for her cholesterol.  Did not tolerate statins.  Is working hard to improve her diet especially starchy foods due to elevated triglycerides.  Is trying to exercise more.    Sjogren's syndrome Remains on plaquenil.  Vision is declining especially left eye.  She has been reading more recently, but she is concerned it is a medication effect.  Has a f/u in two weeks with ophthalmology.    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  Advised to keep her appt with her ophthalmologist for 2 wks.  She does not have pain or erythema of her eyes.   Labs/tests ordered:  Cbc, cmp, flp Next appt: 6 mos

## 2012-10-27 NOTE — Assessment & Plan Note (Signed)
Remains on plaquenil.  Vision is declining especially left eye.  She has been reading more recently, but she is concerned it is a medication effect.  Has a f/u in two weeks with ophthalmology.

## 2012-10-27 NOTE — Assessment & Plan Note (Addendum)
May also be progressing as cause of decreased vision in left eye.  Advised to keep her appt with her ophthalmologist for 2 wks.  She does not have pain or erythema of her eyes.

## 2012-11-09 NOTE — Progress Notes (Signed)
This encounter was created in error - please disregard.

## 2012-11-15 ENCOUNTER — Other Ambulatory Visit: Payer: Self-pay

## 2012-11-28 ENCOUNTER — Encounter: Payer: Self-pay | Admitting: Internal Medicine

## 2012-12-27 ENCOUNTER — Other Ambulatory Visit: Payer: Self-pay | Admitting: Internal Medicine

## 2013-02-15 ENCOUNTER — Other Ambulatory Visit: Payer: Self-pay

## 2013-02-26 ENCOUNTER — Other Ambulatory Visit: Payer: Self-pay | Admitting: *Deleted

## 2013-02-26 MED ORDER — LOSARTAN POTASSIUM 50 MG PO TABS: ORAL_TABLET | ORAL | Status: DC

## 2013-03-05 ENCOUNTER — Telehealth: Payer: Self-pay

## 2013-03-05 ENCOUNTER — Other Ambulatory Visit: Payer: Self-pay | Admitting: Internal Medicine

## 2013-03-05 DIAGNOSIS — M62830 Muscle spasm of back: Secondary | ICD-10-CM

## 2013-03-05 MED ORDER — METAXALONE 800 MG PO TABS: 800.0000 mg | ORAL_TABLET | Freq: Three times a day (TID) | ORAL | Status: DC

## 2013-03-05 NOTE — Telephone Encounter (Signed)
Patient with back injury @ Gym, patient is taking aleve OTC and its not helping. Patient seen Dr.Spell (Chiropractor)this am. Patient would like to know if Dr.Reed would rx her something for back pain since there is no available appointments today.   Dr.Reed please advise  Pharmacy- [facility]

## 2013-03-05 NOTE — Telephone Encounter (Signed)
Will send a prescription for a muscle relaxant.  She should not drive when taking it.

## 2013-03-05 NOTE — Telephone Encounter (Signed)
Patient aware rx sent in. Patient to schedule office visit if no better

## 2013-03-07 ENCOUNTER — Ambulatory Visit: Payer: Medicare Other | Admitting: Nurse Practitioner

## 2013-04-26 ENCOUNTER — Other Ambulatory Visit: Payer: Medicare Other

## 2013-04-26 ENCOUNTER — Other Ambulatory Visit: Payer: Self-pay | Admitting: *Deleted

## 2013-04-26 DIAGNOSIS — G894 Chronic pain syndrome: Secondary | ICD-10-CM

## 2013-04-26 DIAGNOSIS — M2550 Pain in unspecified joint: Secondary | ICD-10-CM

## 2013-04-26 DIAGNOSIS — R5381 Other malaise: Secondary | ICD-10-CM

## 2013-04-26 DIAGNOSIS — R5383 Other fatigue: Principal | ICD-10-CM

## 2013-04-26 DIAGNOSIS — M3500 Sicca syndrome, unspecified: Secondary | ICD-10-CM

## 2013-04-26 DIAGNOSIS — R300 Dysuria: Secondary | ICD-10-CM

## 2013-04-26 DIAGNOSIS — I10 Essential (primary) hypertension: Secondary | ICD-10-CM

## 2013-04-26 DIAGNOSIS — E785 Hyperlipidemia, unspecified: Secondary | ICD-10-CM

## 2013-04-27 LAB — URINALYSIS
Bilirubin, UA: NEGATIVE
Glucose, UA: NEGATIVE
Ketones, UA: NEGATIVE
Leukocytes, UA: NEGATIVE
Nitrite, UA: NEGATIVE
Protein, UA: NEGATIVE
RBC, UA: NEGATIVE
Specific Gravity, UA: 1.013 (ref 1.005–1.030)
Urobilinogen, Ur: 0.2 mg/dL (ref 0.0–1.9)
pH, UA: 7.5 (ref 5.0–7.5)

## 2013-04-27 LAB — CBC WITH DIFFERENTIAL/PLATELET
Basophils Absolute: 0.1 10*3/uL (ref 0.0–0.2)
Basos: 2 %
Eos: 7 %
Eosinophils Absolute: 0.6 10*3/uL — ABNORMAL HIGH (ref 0.0–0.4)
HCT: 45.5 % (ref 34.0–46.6)
Hemoglobin: 15.3 g/dL (ref 11.1–15.9)
Immature Grans (Abs): 0 10*3/uL (ref 0.0–0.1)
Immature Granulocytes: 0 %
Lymphocytes Absolute: 1.8 10*3/uL (ref 0.7–3.1)
Lymphs: 23 %
MCH: 30.5 pg (ref 26.6–33.0)
MCHC: 33.6 g/dL (ref 31.5–35.7)
MCV: 91 fL (ref 79–97)
Monocytes Absolute: 0.6 10*3/uL (ref 0.1–0.9)
Monocytes: 8 %
Neutrophils Absolute: 4.7 10*3/uL (ref 1.4–7.0)
Neutrophils Relative %: 60 %
RBC: 5.01 x10E6/uL (ref 3.77–5.28)
RDW: 13.4 % (ref 12.3–15.4)
WBC: 7.8 10*3/uL (ref 3.4–10.8)

## 2013-04-27 LAB — LIPID PANEL
Chol/HDL Ratio: 6.1 ratio units — ABNORMAL HIGH (ref 0.0–4.4)
Cholesterol, Total: 218 mg/dL — ABNORMAL HIGH (ref 100–199)
HDL: 36 mg/dL — ABNORMAL LOW (ref >39)
LDL Calculated: 125 mg/dL — ABNORMAL HIGH (ref 0–99)
Triglycerides: 283 mg/dL — ABNORMAL HIGH (ref 0–149)
VLDL Cholesterol Cal: 57 mg/dL — ABNORMAL HIGH (ref 5–40)

## 2013-04-27 LAB — COMPREHENSIVE METABOLIC PANEL
ALT: 23 IU/L (ref 0–32)
AST: 19 IU/L (ref 0–40)
Albumin/Globulin Ratio: 1.8 (ref 1.1–2.5)
Albumin: 4.3 g/dL (ref 3.6–4.8)
Alkaline Phosphatase: 69 IU/L (ref 39–117)
BUN/Creatinine Ratio: 16 (ref 11–26)
BUN: 12 mg/dL (ref 8–27)
CO2: 23 mmol/L (ref 18–29)
Calcium: 9.7 mg/dL (ref 8.6–10.2)
Chloride: 101 mmol/L (ref 97–108)
Creatinine, Ser: 0.77 mg/dL (ref 0.57–1.00)
GFR calc Af Amer: 92 mL/min/{1.73_m2} (ref >59)
GFR calc non Af Amer: 80 mL/min/{1.73_m2} (ref >59)
Globulin, Total: 2.4 g/dL (ref 1.5–4.5)
Glucose: 104 mg/dL — ABNORMAL HIGH (ref 65–99)
Potassium: 4.4 mmol/L (ref 3.5–5.2)
Sodium: 142 mmol/L (ref 134–144)
Total Bilirubin: 0.6 mg/dL (ref 0.0–1.2)
Total Protein: 6.7 g/dL (ref 6.0–8.5)

## 2013-04-27 LAB — ANA: Anti Nuclear Antibody(ANA): POSITIVE — AB

## 2013-04-27 LAB — C3 AND C4
C3 Complement: 135 mg/dL Adult (ref 90–180)
C4 Complement: 36 mg/dL Adult (ref 9–36)

## 2013-04-27 LAB — VITAMIN D 25 HYDROXY (VIT D DEFICIENCY, FRACTURES): Vit D, 25-Hydroxy: 36.2 ng/mL (ref 30.0–100.0)

## 2013-04-30 ENCOUNTER — Encounter: Payer: Self-pay | Admitting: Internal Medicine

## 2013-04-30 ENCOUNTER — Ambulatory Visit (INDEPENDENT_AMBULATORY_CARE_PROVIDER_SITE_OTHER): Payer: Medicare Other | Admitting: Internal Medicine

## 2013-04-30 VITALS — BP 148/86 | HR 95 | Temp 97.6°F | Wt 178.4 lb

## 2013-04-30 DIAGNOSIS — R739 Hyperglycemia, unspecified: Secondary | ICD-10-CM | POA: Insufficient documentation

## 2013-04-30 DIAGNOSIS — R7309 Other abnormal glucose: Secondary | ICD-10-CM

## 2013-04-30 DIAGNOSIS — Z83719 Family history of colon polyps, unspecified: Secondary | ICD-10-CM

## 2013-04-30 DIAGNOSIS — E785 Hyperlipidemia, unspecified: Secondary | ICD-10-CM

## 2013-04-30 DIAGNOSIS — Z8371 Family history of colonic polyps: Secondary | ICD-10-CM

## 2013-04-30 DIAGNOSIS — M949 Disorder of cartilage, unspecified: Secondary | ICD-10-CM

## 2013-04-30 DIAGNOSIS — M8580 Other specified disorders of bone density and structure, unspecified site: Secondary | ICD-10-CM

## 2013-04-30 DIAGNOSIS — M899 Disorder of bone, unspecified: Secondary | ICD-10-CM

## 2013-04-30 DIAGNOSIS — I10 Essential (primary) hypertension: Secondary | ICD-10-CM

## 2013-04-30 DIAGNOSIS — M5441 Lumbago with sciatica, right side: Secondary | ICD-10-CM

## 2013-04-30 DIAGNOSIS — M5430 Sciatica, unspecified side: Secondary | ICD-10-CM

## 2013-04-30 DIAGNOSIS — G894 Chronic pain syndrome: Secondary | ICD-10-CM | POA: Insufficient documentation

## 2013-04-30 NOTE — Progress Notes (Signed)
Patient ID: Felicia Hall, female   DOB: 1946/01/24, 68 y.o.   MRN: [id number]   Location:  [facility]   Allergies  Allergen Reactions  . Statins Other (See Comments)    Muscle problems  . Darvocet [Propoxyphene N-Acetaminophen] Rash    Chief Complaint  Patient presents with  . Medical Managment of Chronic Issues    6 month follow-up, discuss labs (copy of labs printed)  . Hip Pain    Right hip pain, onset from injury 1 week prior to Thanksgiving 2014     HPI: Patient is a 68 y.o. white female seen in the office today for medical mgt of chronic diseases and ongoing right hip pain since before thanksgiving of last year.  Says she overdid it on a treadmill--15 mins and made twinge she had beforehand worse.  Had sciatica down right leg.  Saw chiropractor.  Did not respond to drugs, therapy.  Is better now.  Had to temporarily use cane.  Now feels like muscle strain in her hip.  At least is now going back to gym.  Had started in July and felt like she was making progress.  Then the sciatica happened and was terrible for a whole month.  So now it is like starting fresh again.  Found a place where she does not feel like she doesn't belong.  Using recumbent bike and will go back to walking when hip feels better.    Otherwise, things unchanged.  Aches and sinus stuff and dry eyes and mouth.    Vision is about the same.  Glaucoma specialist retired.  Now seeing general ophthalmologist.   Review of Systems:  ROS  Past Medical History  Diagnosis Date  . 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 Encounter for long-term (current) use of other medications   . Routine general medical examination at a health care facility   . Tear film insufficiency, unspecified   . Unspecified disorder of kidney and ureter   . Cervical spondylosis without myelopathy   . Cervicalgia   . Disorder of bone and cartilage, unspecified   . Acute upper  respiratory infections of unspecified site   . Allergic rhinitis due to pollen   . Other and unspecified hyperlipidemia   . Unspecified glaucoma   . Benign essential hypertension   . Diaphragmatic hernia without mention of obstruction or gangrene   . Diverticulosis of colon (without mention of hemorrhage)   . Pain in joint, site unspecified   . Hyperlipidemia LDL goal < 100   . Sjogren's syndrome 10/27/2012    Past Surgical History  Procedure Laterality Date  . Cholecystectomy  1993    Dr.Lindsey     Social History:   reports that she has never smoked. She does not have any smokeless tobacco history on file. She reports that she does not drink alcohol or use illicit drugs.  Family History  Problem Relation Age of Onset  . Heart attack Mother   . Pancreatitis Father   . Breast cancer Mother   . Lupus Cousin     Medications: Patient's Medications  New Prescriptions   No medications on file  Previous Medications   ASPIRIN 81 MG TABLET    Take 81 mg by mouth daily. Take one tablet once daily   CALCIUM CITRATE-VITAMIN D (CITRACAL+D) 315-200 MG-UNIT PER TABLET    Take 1 tablet by mouth 4 (four) times daily.   CHOLECALCIFEROL (VITAMIN D) 1000 UNITS TABLET  Take 1,000 Units by mouth daily. Take one tablet once daily   CO-ENZYME Q-10 100 MG CAPS    Take by mouth. Take one tablet once daily   LOSARTAN (COZAAR) 50 MG TABLET    Take one tablet by mouth once daily   MULTIPLE VITAMINS-MINERALS (CENTRUM SILVER PO)    Take 0.5 tablets by mouth daily.    OMEPRAZOLE (PRILOSEC) 20 MG CAPSULE    Take 20 mg by mouth daily.   PLANT STEROLS AND STANOLS (CHOLEST OFF PO)    Take 1 tablet by mouth 4 (four) times daily.    TIMOLOL (TIMOPTIC-XR) 0.5 % OPHTHALMIC GEL-FORMING    Place 1 drop into both eyes daily.   Modified Medications   No medications on file  Discontinued Medications   HYDROXYCHLOROQUINE (PLAQUENIL) 200 MG TABLET    Take 200 mg by mouth 2 (two) times daily. Take one tablet twice  daily   METAXALONE (SKELAXIN) 800 MG TABLET    Take 1 tablet (800 mg total) by mouth 3 (three) times daily.   MISC NATURAL PRODUCTS (OSTEO BI-FLEX JOINT SHIELD PO)    Take 1 tablet by mouth daily.    MISC NATURAL PRODUCTS (TART CHERRY ADVANCED PO)    Take 1 tablet by mouth daily.   TURMERIC PO    Take by mouth once.     Physical Exam: Filed Vitals:   04/30/13 0831  BP: 148/86  Pulse: 95  Temp: 97.6 F (36.4 C)  TempSrc: Oral  Weight: 178 lb 6.4 oz (80.922 kg)  SpO2: 98%  Physical Exam  Labs reviewed: Basic Metabolic Panel:  Recent Labs  10/10/12 1143 10/25/12 0819 04/26/13 0839  NA 134* 139 142  K 4.0 4.2 4.4  CL 100 102 101  CO2 22 22 23   GLUCOSE 142* 94 104*  BUN 15 10 12   CREATININE 0.70 0.89 0.77  CALCIUM 9.2 10.3* 9.7   Liver Function Tests:  Recent Labs  10/10/12 1143 04/26/13 0839  AST 24 19  ALT 23 23  ALKPHOS 63 69  BILITOT 0.9 0.6  PROT 7.3 6.7  ALBUMIN 4.0  --     Recent Labs  10/10/12 1143  LIPASE 16  CBC:  Recent Labs  07/18/12 0826 10/10/12 1143 10/25/12 0819 04/26/13 0839  WBC 7.4 14.1* 7.4 7.8  NEUTROABS 4.2 13.5* 4.4 4.7  HGB 14.4 16.3* 14.7 15.3  HCT 43.1 45.9 45.6 45.5  MCV 92 88.6 93 91  PLT  --  248  --   --    Lipid Panel:  Recent Labs  07/18/12 0826 10/25/12 0819 04/26/13 0839  HDL 44 38* 36*  LDLCALC 158* 116* 125*  TRIG 177* 191* 283*  CHOLHDL 5.4* 5.1* 6.1*   Assessment/Plan 1. Chronic pain syndrome -unchanged lately--continues with chronic neck and back pain  2. Hyperlipidemia LDL goal < 100 -remains elevated and has actually gotten worse -cannot tolerate statins -had not been able to exercise due to her sciatica episode and is just getting back to her routine at the gym--encouraged this -also encouraged cutting back on starchy foods  3. Hyperglycemia -check hba1c before next visit -encouraged diet and exercise as above  4. Low back pain with right-sided sciatica -is getting better  now -gradually returning to exercise  5. Essential hypertension, benign -bp slightly above goal  6. Osteopenia, senile -advised to increase vitamin D capsule to 2000 units daily from 1000 units daily and continue ca with D  7.  Fh/o colon polyps:  Will return to  [facility] for colonoscopy--says it has been 8 years  Preventative care: Up to date on mammogram and bone density both 2014 Had pneumococcal at 84 Had flu shot Will be seeing gyn in Mar  Labs/tests ordered: Orders Placed This Encounter  Procedures  . CBC with Differential    Standing Status: Future     Number of Occurrences:      Standing Expiration Date: 04/30/2014  . Comprehensive metabolic panel    Standing Status: Future     Number of Occurrences:      Standing Expiration Date: 04/30/2014  . Hemoglobin A1c    Standing Status: Future     Number of Occurrences:      Standing Expiration Date: 04/30/2014  . Lipid panel    Standing Status: Future     Number of Occurrences:      Standing Expiration Date: 04/30/2014   Next appt:  6 mos labs before

## 2013-08-25 ENCOUNTER — Other Ambulatory Visit: Payer: Self-pay | Admitting: Adult Health

## 2013-10-30 ENCOUNTER — Other Ambulatory Visit: Payer: Medicare Other

## 2013-10-30 DIAGNOSIS — E785 Hyperlipidemia, unspecified: Secondary | ICD-10-CM

## 2013-10-30 DIAGNOSIS — R739 Hyperglycemia, unspecified: Secondary | ICD-10-CM

## 2013-10-30 DIAGNOSIS — G894 Chronic pain syndrome: Secondary | ICD-10-CM

## 2013-10-31 LAB — COMPREHENSIVE METABOLIC PANEL
ALT: 23 IU/L (ref 0–32)
AST: 21 IU/L (ref 0–40)
Albumin/Globulin Ratio: 1.7 (ref 1.1–2.5)
Albumin: 4.2 g/dL (ref 3.6–4.8)
Alkaline Phosphatase: 74 IU/L (ref 39–117)
BUN/Creatinine Ratio: 12 (ref 11–26)
BUN: 10 mg/dL (ref 8–27)
CO2: 23 mmol/L (ref 18–29)
Calcium: 9.7 mg/dL (ref 8.7–10.3)
Chloride: 100 mmol/L (ref 97–108)
Creatinine, Ser: 0.85 mg/dL (ref 0.57–1.00)
GFR calc Af Amer: 81 mL/min/{1.73_m2} (ref >59)
GFR calc non Af Amer: 71 mL/min/{1.73_m2} (ref >59)
Globulin, Total: 2.5 g/dL (ref 1.5–4.5)
Glucose: 104 mg/dL — ABNORMAL HIGH (ref 65–99)
Potassium: 4.3 mmol/L (ref 3.5–5.2)
Sodium: 139 mmol/L (ref 134–144)
Total Bilirubin: 0.6 mg/dL (ref 0.0–1.2)
Total Protein: 6.7 g/dL (ref 6.0–8.5)

## 2013-10-31 LAB — CBC WITH DIFFERENTIAL/PLATELET
Basophils Absolute: 0.1 10*3/uL (ref 0.0–0.2)
Basos: 2 %
Eos: 8 %
Eosinophils Absolute: 0.7 10*3/uL — ABNORMAL HIGH (ref 0.0–0.4)
HCT: 43.5 % (ref 34.0–46.6)
Hemoglobin: 14.5 g/dL (ref 11.1–15.9)
Immature Grans (Abs): 0 10*3/uL (ref 0.0–0.1)
Immature Granulocytes: 0 %
Lymphocytes Absolute: 2.2 10*3/uL (ref 0.7–3.1)
Lymphs: 26 %
MCH: 29.6 pg (ref 26.6–33.0)
MCHC: 33.3 g/dL (ref 31.5–35.7)
MCV: 89 fL (ref 79–97)
Monocytes Absolute: 0.8 10*3/uL (ref 0.1–0.9)
Monocytes: 9 %
Neutrophils Absolute: 4.8 10*3/uL (ref 1.4–7.0)
Neutrophils Relative %: 55 %
RBC: 4.9 x10E6/uL (ref 3.77–5.28)
RDW: 13.9 % (ref 12.3–15.4)
WBC: 8.7 10*3/uL (ref 3.4–10.8)

## 2013-10-31 LAB — LIPID PANEL
Chol/HDL Ratio: 5.9 ratio units — ABNORMAL HIGH (ref 0.0–4.4)
Cholesterol, Total: 205 mg/dL — ABNORMAL HIGH (ref 100–199)
HDL: 35 mg/dL — ABNORMAL LOW (ref >39)
LDL Calculated: 120 mg/dL — ABNORMAL HIGH (ref 0–99)
Triglycerides: 252 mg/dL — ABNORMAL HIGH (ref 0–149)
VLDL Cholesterol Cal: 50 mg/dL — ABNORMAL HIGH (ref 5–40)

## 2013-10-31 LAB — HEMOGLOBIN A1C
Est. average glucose Bld gHb Est-mCnc: 137 mg/dL
Hgb A1c MFr Bld: 6.4 % — ABNORMAL HIGH (ref 4.8–5.6)

## 2013-11-01 ENCOUNTER — Ambulatory Visit (INDEPENDENT_AMBULATORY_CARE_PROVIDER_SITE_OTHER): Payer: Medicare Other | Admitting: Internal Medicine

## 2013-11-01 ENCOUNTER — Encounter: Payer: Self-pay | Admitting: Internal Medicine

## 2013-11-01 VITALS — BP 130/88 | HR 86 | Temp 98.2°F | Resp 10 | Ht 66.5 in | Wt 180.0 lb

## 2013-11-01 DIAGNOSIS — Z1211 Encounter for screening for malignant neoplasm of colon: Secondary | ICD-10-CM

## 2013-11-01 DIAGNOSIS — M5441 Lumbago with sciatica, right side: Secondary | ICD-10-CM

## 2013-11-01 DIAGNOSIS — M3500 Sicca syndrome, unspecified: Secondary | ICD-10-CM

## 2013-11-01 DIAGNOSIS — E782 Mixed hyperlipidemia: Secondary | ICD-10-CM

## 2013-11-01 DIAGNOSIS — M5430 Sciatica, unspecified side: Secondary | ICD-10-CM

## 2013-11-01 DIAGNOSIS — R7309 Other abnormal glucose: Secondary | ICD-10-CM

## 2013-11-01 DIAGNOSIS — I10 Essential (primary) hypertension: Secondary | ICD-10-CM

## 2013-11-01 DIAGNOSIS — G894 Chronic pain syndrome: Secondary | ICD-10-CM

## 2013-11-01 DIAGNOSIS — Z23 Encounter for immunization: Secondary | ICD-10-CM

## 2013-11-01 DIAGNOSIS — R739 Hyperglycemia, unspecified: Secondary | ICD-10-CM

## 2013-11-01 MED ORDER — LOSARTAN POTASSIUM 50 MG PO TABS: ORAL_TABLET | ORAL | Status: DC

## 2013-11-01 MED ORDER — EZETIMIBE 10 MG PO TABS: 10.0000 mg | ORAL_TABLET | Freq: Every day | ORAL | Status: DC

## 2013-11-01 NOTE — Progress Notes (Signed)
Patient ID: Felicia Hall, female   DOB: [birth year], 68 y.o.   MRN: [id number]   Location:  [facility]   Allergies  Allergen Reactions  . Statins Other (See Comments)    Muscle problems  . Darvocet [Propoxyphene N-Acetaminophen] Rash    Chief Complaint  Patient presents with  . Medical Management of Chronic Issues    6 month follow-up and discuss labs (copy printed)  . Screening Test    Discuss Cologaurd   . Results    Discuss genetic test results     HPI: Patient is a 68 y.o. white female seen in the office today for med mgt of chronic diseases  Overdue for cscope, has been 7 years.  Discontinuing liquids is really hard for her during the prep.    Things are about the same.  Feels like she is getting stiffer and dryer and needs to get back on some medication via rheumatology.  Continues to go to the gym.  Using recumbent bike and doing some weights.  Cannot walk a lot.  Takes her three days to recover from each workout.  Going 2x per week.  Continues to do too much eating.  Says she eats a lot of chicken, but has cut back on fried and starchy foods.  Admits to bad days where she eats wrong.      Still has occasional bouts of pain down her hamstring on the right.  Is very slowly getting better.  Less painful to sit and to walk now.  Had pap in June which was ok.  Has mammogram scheduled next month.  Review of Systems:  Review of Systems  Constitutional: Positive for malaise/fatigue. Negative for fever.  HENT: Negative for congestion and hearing loss.   Eyes: Positive for blurred vision.  Respiratory: Negative for shortness of breath.   Cardiovascular: Negative for chest pain and leg swelling.  Gastrointestinal: Negative for abdominal pain, constipation, blood in stool and melena.  Genitourinary: Negative for dysuria.  Musculoskeletal: Positive for myalgias, back pain and joint pain. Negative for falls.  Skin: Negative for rash.    Neurological: Negative for dizziness, weakness and headaches.  Psychiatric/Behavioral: Negative for depression and memory loss.       Always denies depression, but seems depressed    Past Medical History  Diagnosis Date  . 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 Encounter for long-term (current) use of other medications   . Routine general medical examination at a health care facility   . Tear film insufficiency, unspecified   . Unspecified disorder of kidney and ureter   . Cervical spondylosis without myelopathy   . Cervicalgia   . Disorder of bone and cartilage, unspecified   . Acute upper respiratory infections of unspecified site   . Allergic rhinitis due to pollen   . Other and unspecified hyperlipidemia   . Unspecified glaucoma   . Benign essential hypertension   . Diaphragmatic hernia without mention of obstruction or gangrene   . Diverticulosis of colon (without mention of hemorrhage)   . Pain in joint, site unspecified   . Hyperlipidemia LDL goal < 100   . Sjogren's syndrome 10/27/2012    Past Surgical History  Procedure Laterality Date  . Cholecystectomy  1993    Dr.Lindsey     Social History:   reports that she has never smoked. She does not have any smokeless tobacco history on file. She reports that she does not drink alcohol  or use illicit drugs.  Family History  Problem Relation Age of Onset  . Heart attack Mother   . Pancreatitis Father   . Breast cancer Mother   . Lupus Cousin     Medications: Patient's Medications  New Prescriptions   No medications on file  Previous Medications   ASPIRIN 81 MG TABLET    Take 81 mg by mouth daily. Take one tablet once daily   CALCIUM CITRATE-VITAMIN D (CITRACAL+D) 315-200 MG-UNIT PER TABLET    Take 1 tablet by mouth 4 (four) times daily.   CHOLECALCIFEROL (VITAMIN D) 1000 UNITS TABLET    Take 1,000 Units by mouth daily. Take one tablet once daily   MAGNESIUM 250 MG TABS    Take by mouth daily.    MULTIPLE VITAMINS-MINERALS (CENTRUM SILVER PO)    Take 0.5 tablets by mouth daily.    OMEPRAZOLE (PRILOSEC) 20 MG CAPSULE    Take 20 mg by mouth daily. For acid reflux   PLANT STEROLS AND STANOLS (CHOLEST OFF PO)    Take 1 tablet by mouth 4 (four) times daily.    TIMOLOL (TIMOPTIC-XR) 0.5 % OPHTHALMIC GEL-FORMING    Place 1 drop into both eyes daily.   Modified Medications   Modified Medication Previous Medication   LOSARTAN (COZAAR) 50 MG TABLET losartan (COZAAR) 50 MG tablet      TAKE 1 TABLET BY MOUTH EVERY DAY for blood pressure    TAKE 1 TABLET BY MOUTH EVERY DAY  Discontinued Medications   CO-ENZYME Q-10 100 MG CAPS    Take by mouth. Take one tablet once daily     Physical Exam: Filed Vitals:   11/01/13 1007  BP: 130/88  Pulse: 86  Temp: 98.2 F (36.8 C)  TempSrc: Oral  Resp: 10  Height: 5' 6.5" (1.689 m)  Weight: 180 lb (81.647 kg)  SpO2: 96%  Physical Exam  Constitutional: She is oriented to person, place, and time. She appears well-developed and well-nourished. No distress.  Cardiovascular: Normal rate, regular rhythm, normal heart sounds and intact distal pulses.   Pulmonary/Chest: Effort normal and breath sounds normal. No respiratory distress.  Abdominal: Soft. Bowel sounds are normal. She exhibits no distension and no mass. There is no tenderness.  Musculoskeletal: Normal range of motion.  Neurological: She is alert and oriented to person, place, and time.  Skin: Skin is warm and dry. There is pallor.  Psychiatric:  Flat affect    Labs reviewed: Basic Metabolic Panel:  Recent Labs  04/26/13 0839 10/30/13 0805  NA 142 139  K 4.4 4.3  CL 101 100  CO2 23 23  GLUCOSE 104* 104*  BUN 12 10  CREATININE 0.77 0.85  CALCIUM 9.7 9.7   Liver Function Tests:  Recent Labs  04/26/13 0839 10/30/13 0805  AST 19 21  ALT 23 23  ALKPHOS 69 74  BILITOT 0.6 0.6  PROT 6.7 6.7   No results found for this basename: LIPASE, AMYLASE,  in the last 8760 hours No  results found for this basename: AMMONIA,  in the last 8760 hours CBC:  Recent Labs  04/26/13 0839 10/30/13 0805  WBC 7.8 8.7  NEUTROABS 4.7 4.8  HGB 15.3 14.5  HCT 45.5 43.5  MCV 91 89   Lipid Panel:  Recent Labs  04/26/13 0839 10/30/13 0805  HDL 36* 35*  LDLCALC 125* 120*  TRIG 283* 252*  CHOLHDL 6.1* 5.9*   Lab Results  Component Value Date   HGBA1C 6.4* 10/30/2013  Assessment/Plan 1. Mixed hyperlipidemia - cont her cholest off that she is taking, f/u lipids--may consider zetia and if that's ineffective, consider praluent  - Lipid panel; Future  2. Hyperglycemia - discussed dietary changes--she loves starchy snacks - Lipid panel; Future  3. Chronic pain syndrome -with RA, back pain, myalgias, not currently on a pain regimen  4. Sjogren's syndrome -cont to follow with rheum  5. Essential hypertension, benign -bp at goal, tolerating losartan though genetic testing did not recommend it   6. Right-sided low back pain with right-sided sciatica -ongoing; has had another episode that is gradually improving  7. Special screening for malignant neoplasms, colon - agrees to cologuard testing  8. Need for vaccination with 13-polyvalent pneumococcal conjugate vaccine - Pneumococcal conjugate vaccine 13-valent given  Labs/tests ordered:   Orders Placed This Encounter  Procedures  . Pneumococcal conjugate vaccine 13-valent  . Lipid panel    Standing Status: Future     Number of Occurrences: 1     Standing Expiration Date: 05/04/2014    Order Specific Question:  Has the patient fasted?    Answer:  Yes    Next appt:  3 mos with labs before

## 2013-11-24 ENCOUNTER — Other Ambulatory Visit: Payer: Self-pay | Admitting: Internal Medicine

## 2013-11-28 LAB — COLOGUARD: Cologuard: NEGATIVE

## 2013-12-04 LAB — HM MAMMOGRAPHY: HM Mammogram: NEGATIVE

## 2013-12-05 ENCOUNTER — Encounter: Payer: Self-pay | Admitting: *Deleted

## 2013-12-06 ENCOUNTER — Telehealth: Payer: Self-pay

## 2013-12-06 NOTE — Telephone Encounter (Signed)
Spoke with patient, patient verbalized understanding of results  

## 2013-12-06 NOTE — Telephone Encounter (Signed)
Left message on voicemail for patient to return call when available   Reason for call- Per Dr.Reed cologuard ( colorectal cancer screening test) was Negative.

## 2014-01-29 ENCOUNTER — Other Ambulatory Visit: Payer: Medicare Other

## 2014-01-29 DIAGNOSIS — E782 Mixed hyperlipidemia: Secondary | ICD-10-CM

## 2014-01-29 DIAGNOSIS — R739 Hyperglycemia, unspecified: Secondary | ICD-10-CM

## 2014-01-30 LAB — LIPID PANEL
Chol/HDL Ratio: 4.9 ratio units — ABNORMAL HIGH (ref 0.0–4.4)
Cholesterol, Total: 190 mg/dL (ref 100–199)
HDL: 39 mg/dL — ABNORMAL LOW (ref >39)
LDL Calculated: 110 mg/dL — ABNORMAL HIGH (ref 0–99)
Triglycerides: 204 mg/dL — ABNORMAL HIGH (ref 0–149)
VLDL Cholesterol Cal: 41 mg/dL — ABNORMAL HIGH (ref 5–40)

## 2014-01-31 ENCOUNTER — Ambulatory Visit (INDEPENDENT_AMBULATORY_CARE_PROVIDER_SITE_OTHER): Payer: Medicare Other | Admitting: Internal Medicine

## 2014-01-31 ENCOUNTER — Encounter: Payer: Self-pay | Admitting: Internal Medicine

## 2014-01-31 VITALS — BP 156/100 | HR 84 | Temp 98.0°F | Resp 18 | Ht 66.5 in | Wt 180.2 lb

## 2014-01-31 DIAGNOSIS — E782 Mixed hyperlipidemia: Secondary | ICD-10-CM

## 2014-01-31 DIAGNOSIS — Z1211 Encounter for screening for malignant neoplasm of colon: Secondary | ICD-10-CM

## 2014-01-31 DIAGNOSIS — I10 Essential (primary) hypertension: Secondary | ICD-10-CM

## 2014-01-31 DIAGNOSIS — M5441 Lumbago with sciatica, right side: Secondary | ICD-10-CM

## 2014-01-31 DIAGNOSIS — R739 Hyperglycemia, unspecified: Secondary | ICD-10-CM

## 2014-01-31 MED ORDER — EZETIMIBE 10 MG PO TABS: 10.0000 mg | ORAL_TABLET | Freq: Every day | ORAL | Status: DC

## 2014-01-31 NOTE — Progress Notes (Signed)
Patient ID: Felicia Hall, female   DOB: 02-Apr-1946, 68 y.o.   MRN: [id number]   Location:  [facility]  Code Status: previously given advance directive information, has not yet completed  Allergies  Allergen Reactions  . Statins Other (See Comments)    Muscle problems  . Darvocet [Propoxyphene N-Acetaminophen] Rash    Chief Complaint  Patient presents with  . Medical Management of Chronic Issues    HPI: Patient is a 68 y.o. white female seen in the office today for med mgt chronic diseases.    Gives her gas and makes her stomach feel bad in the mornings.  Thinks it is causing muscle aches also.  Says if it gets worse, she'll stop, but cholesterol has improved.    Has had flare up of her sciatica also.  Is finally improving.  Hasn't been exercising in past few weeks as a result.  Chiropractor has been helping with it.    No changes from specialists she can remember.  Saw ophtho in August and was stable.  Dermatology unchanged, too--sees again in December.    Had mammo in August which was normal.  Had colon screening test at home.  It was normal.    Has been cutting down on sugars.    BP up but has white coat and gets annoyed waiting.  120s/70s at home.  Review of Systems:  Review of Systems  Constitutional: Negative for fever.  Eyes: Positive for blurred vision.  Respiratory: Negative for shortness of breath.   Cardiovascular: Negative for chest pain.  Gastrointestinal: Negative for abdominal pain, constipation, blood in stool and melena.  Genitourinary: Negative for dysuria, urgency and frequency.  Musculoskeletal: Positive for back pain and myalgias. Negative for falls.       Right sciatica  Skin: Negative for rash.  Neurological: Negative for dizziness and headaches.  Psychiatric/Behavioral: Positive for depression. Negative for memory loss.    Past Medical History  Diagnosis Date  . Keratoconjunctivitis sicca, not  specified as Sjogren's   . Encounter for long-term (current) use of other medications   . Routine general medical examination at a health care facility   . Tear film insufficiency, unspecified   . Unspecified disorder of kidney and ureter   . Cervical spondylosis without myelopathy   . Cervicalgia   . Disorder of bone and cartilage, unspecified   . Acute upper respiratory infections of unspecified site   . Allergic rhinitis due to pollen   . Other and unspecified hyperlipidemia   . Unspecified glaucoma   . Benign essential hypertension   . Diaphragmatic hernia without mention of obstruction or gangrene   . Diverticulosis of colon (without mention of hemorrhage)   . Pain in joint, site unspecified   . Hyperlipidemia LDL goal < 100   . Sjogren's syndrome 10/27/2012    Past Surgical History  Procedure Laterality Date  . Cholecystectomy  1993    Dr.Lindsey     Social History:   reports that she has never smoked. She does not have any smokeless tobacco history on file. She reports that she does not drink alcohol or use illicit drugs.  Family History  Problem Relation Age of Onset  . Heart attack Mother   . Pancreatitis Father   . Breast cancer Mother   . Lupus Cousin     Medications: Patient's Medications  New Prescriptions   No medications on file  Previous Medications   ASPIRIN 81 MG TABLET  Take 81 mg by mouth daily. Take one tablet once daily   CALCIUM CITRATE-VITAMIN D (CITRACAL+D) 315-200 MG-UNIT PER TABLET    Take 1 tablet by mouth 4 (four) times daily.   CHOLECALCIFEROL (VITAMIN D) 1000 UNITS TABLET    Take 1,000 Units by mouth daily. Take one tablet once daily   EZETIMIBE (ZETIA) 10 MG TABLET    Take 1 tablet (10 mg total) by mouth daily.   LOSARTAN (COZAAR) 50 MG TABLET    TAKE 1 TABLET BY MOUTH EVERY DAY for blood pressure   MULTIPLE VITAMINS-MINERALS (CENTRUM SILVER PO)    Take 0.5 tablets by mouth daily.    OMEPRAZOLE (PRILOSEC) 20 MG CAPSULE    Take 20 mg by  mouth daily. For acid reflux   PLANT STEROLS AND STANOLS (CHOLEST OFF PO)    Take 1 tablet by mouth 4 (four) times daily.    TIMOLOL (TIMOPTIC-XR) 0.5 % OPHTHALMIC GEL-FORMING    Place 1 drop into both eyes daily.   Modified Medications   No medications on file  Discontinued Medications   LOSARTAN (COZAAR) 50 MG TABLET    TAKE 1 TABLET BY MOUTH EVERY DAY   MAGNESIUM 250 MG TABS    Take by mouth daily.     Physical Exam: Filed Vitals:   01/31/14 1048  BP: 156/100  Pulse: 84  Temp: 98 F (36.7 C)  TempSrc: Oral  Resp: 18  Height: 5' 6.5" (1.689 m)  Weight: 180 lb 3.2 oz (81.738 kg)  SpO2: 98%  Physical Exam  Constitutional: She is oriented to person, place, and time. She appears well-developed and well-nourished.  Cardiovascular: Normal rate, regular rhythm, normal heart sounds and intact distal pulses.   Pulmonary/Chest: Effort normal and breath sounds normal. No respiratory distress.  Abdominal: Bowel sounds are normal.  Musculoskeletal: Normal range of motion. She exhibits tenderness.  Over right sacroiliac joint  Neurological: She is alert and oriented to person, place, and time.  Skin: Skin is warm and dry. There is pallor.  Psychiatric: She has a normal mood and affect.    Labs reviewed: Basic Metabolic Panel:  Recent Labs  04/26/13 0839 10/30/13 0805  NA 142 139  K 4.4 4.3  CL 101 100  CO2 23 23  GLUCOSE 104* 104*  BUN 12 10  CREATININE 0.77 0.85  CALCIUM 9.7 9.7   Liver Function Tests:  Recent Labs  04/26/13 0839 10/30/13 0805  AST 19 21  ALT 23 23  ALKPHOS 69 74  BILITOT 0.6 0.6  PROT 6.7 6.7   No results found for this basename: LIPASE, AMYLASE,  in the last 8760 hours No results found for this basename: AMMONIA,  in the last 8760 hours CBC:  Recent Labs  04/26/13 0839 10/30/13 0805  WBC 7.8 8.7  NEUTROABS 4.7 4.8  HGB 15.3 14.5  HCT 45.5 43.5  MCV 91 89   Lipid Panel:  Recent Labs  04/26/13 0839 10/30/13 0805 01/29/14 0829    HDL 36* 35* 39*  LDLCALC 125* 120* 110*  TRIG 283* 252* 204*  CHOLHDL 6.1* 5.9* 4.9*   Lab Results  Component Value Date   HGBA1C 6.4* 10/30/2013   Assessment/Plan 1. Mixed hyperlipidemia - will continue with zetia, but to let me know if she decides to stop it;  Has had an improvement -I don't think her muscle cramps are coming from this -will read about praluent also as an alternative - ezetimibe (ZETIA) 10 MG tablet; Take 1 tablet (10 mg total)  by mouth daily.  Dispense: 30 tablet; Refill: 3 - Hemoglobin A1c; Future - Lipid panel; Future - Basic metabolic panel; Future  2. Special screening for malignant neoplasms, colon -cologuard was negative this year (2015)  3. Right-sided low back pain with right-sided sciatica -is seeing her chiropractor about this  4. Essential hypertension, benign -bp high here, but is well controlled at home -gets upset waiting  5. Hyperglycemia - cont to watch sweets, starches that cause this and elevated her triglycerides - Hemoglobin A1c; Future - Basic metabolic panel; Future  Labs/tests ordered:   Orders Placed This Encounter  Procedures  . Hemoglobin A1c    Standing Status: Future     Number of Occurrences:      Standing Expiration Date: 10/02/2014  . Lipid panel    Standing Status: Future     Number of Occurrences:      Standing Expiration Date: 10/02/2014    Order Specific Question:  Has the patient fasted?    Answer:  Yes  . Basic metabolic panel    Standing Status: Future     Number of Occurrences:      Standing Expiration Date: 10/02/2014    Order Specific Question:  Has the patient fasted?    Answer:  Yes    Next appt:  4 mos  Tyus Kallam L. Nicosha Struve, D.O. [facility] [address], [place] [address] Cell Phone (Mon-Fri 8am-5pm):  [phone] On Call:  [phone] & follow prompts after 5pm & weekends Office Phone:  [phone] Office Fax:  [phone]

## 2014-01-31 NOTE — Patient Instructions (Signed)
Praluent is injectable cholesterol medicine.

## 2014-02-23 ENCOUNTER — Other Ambulatory Visit: Payer: Self-pay | Admitting: Internal Medicine

## 2014-05-23 ENCOUNTER — Other Ambulatory Visit: Payer: Medicare Other

## 2014-05-23 DIAGNOSIS — R739 Hyperglycemia, unspecified: Secondary | ICD-10-CM

## 2014-05-23 DIAGNOSIS — E782 Mixed hyperlipidemia: Secondary | ICD-10-CM

## 2014-05-24 LAB — BASIC METABOLIC PANEL
BUN/Creatinine Ratio: 12 (ref 11–26)
BUN: 10 mg/dL (ref 8–27)
CO2: 22 mmol/L (ref 18–29)
Calcium: 9.6 mg/dL (ref 8.7–10.3)
Chloride: 102 mmol/L (ref 97–108)
Creatinine, Ser: 0.81 mg/dL (ref 0.57–1.00)
GFR calc Af Amer: 86 mL/min/{1.73_m2} (ref >59)
GFR calc non Af Amer: 75 mL/min/{1.73_m2} (ref >59)
Glucose: 115 mg/dL — ABNORMAL HIGH (ref 65–99)
Potassium: 4.2 mmol/L (ref 3.5–5.2)
Sodium: 142 mmol/L (ref 134–144)

## 2014-05-24 LAB — HEMOGLOBIN A1C
Est. average glucose Bld gHb Est-mCnc: 131 mg/dL
Hgb A1c MFr Bld: 6.2 % — ABNORMAL HIGH (ref 4.8–5.6)

## 2014-05-24 LAB — LIPID PANEL
Chol/HDL Ratio: 5.4 ratio units — ABNORMAL HIGH (ref 0.0–4.4)
Cholesterol, Total: 190 mg/dL (ref 100–199)
HDL: 35 mg/dL — ABNORMAL LOW (ref >39)
LDL Calculated: 112 mg/dL — ABNORMAL HIGH (ref 0–99)
Triglycerides: 217 mg/dL — ABNORMAL HIGH (ref 0–149)
VLDL Cholesterol Cal: 43 mg/dL — ABNORMAL HIGH (ref 5–40)

## 2014-05-27 ENCOUNTER — Ambulatory Visit (INDEPENDENT_AMBULATORY_CARE_PROVIDER_SITE_OTHER): Payer: Medicare Other | Admitting: Internal Medicine

## 2014-05-27 ENCOUNTER — Encounter: Payer: Self-pay | Admitting: Internal Medicine

## 2014-05-27 VITALS — BP 132/82 | HR 92 | Temp 98.1°F | Resp 18 | Ht 65.6 in | Wt 180.2 lb

## 2014-05-27 DIAGNOSIS — M3500 Sicca syndrome, unspecified: Secondary | ICD-10-CM

## 2014-05-27 DIAGNOSIS — G894 Chronic pain syndrome: Secondary | ICD-10-CM

## 2014-05-27 DIAGNOSIS — M5441 Lumbago with sciatica, right side: Secondary | ICD-10-CM

## 2014-05-27 DIAGNOSIS — I10 Essential (primary) hypertension: Secondary | ICD-10-CM

## 2014-05-27 DIAGNOSIS — E782 Mixed hyperlipidemia: Secondary | ICD-10-CM

## 2014-05-27 DIAGNOSIS — R739 Hyperglycemia, unspecified: Secondary | ICD-10-CM

## 2014-05-27 MED ORDER — LOSARTAN POTASSIUM 50 MG PO TABS: 50.0000 mg | ORAL_TABLET | Freq: Every day | ORAL | Status: DC

## 2014-05-27 NOTE — Progress Notes (Signed)
Patient ID: Felicia Hall, female   DOB: 09-06-1945, 69 y.o.   MRN: [id number]   Location:  [facility]  Allergies  Allergen Reactions  . Statins Other (See Comments)    Muscle problems  . Darvocet [Propoxyphene N-Acetaminophen] Rash    Chief Complaint  Patient presents with  . Medical Management of Chronic Issues    sinus headaches    HPI: Patient is a 69 y.o. seen in the office today for med mgt chronic diseases.    Sinus headaches are killing her.  Dry air makes it bad.  Lots of postnasal drip.  No fever, chills.  Discussed warm humidity.    Sciatica not bad right now.  Taking the tart cherry supplement--shoulders and hips are not as sore and achey.  Stopped going to gym, but attending senior exercise programs--step aerobics, stretching, weights.  Says it's not bad.    Sjogren's also worse these days.    Vision about the same.  Saw ophtho last week.  No worsening of glaucoma.  May need cataracts done after next visit in August--says world is getting fuzzier.  Looked up the injection praluent--zetia has not adequately brought down lipids and has not tolerated statins or fibrates.  Michela Pitcher giving it to herself would be difficult.     Review of Systems:  Review of Systems  Constitutional: Negative for fever and chills.  HENT: Positive for congestion.   Eyes: Positive for blurred vision.  Respiratory: Negative for shortness of breath.   Cardiovascular: Negative for chest pain.  Gastrointestinal: Negative for abdominal pain.  Genitourinary: Negative for dysuria.  Musculoskeletal: Positive for myalgias, back pain, joint pain and neck pain. Negative for falls.  Skin: Negative for rash.  Neurological: Positive for headaches. Negative for dizziness.  Psychiatric/Behavioral: Negative for memory loss.     Past Medical History  Diagnosis Date  . 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 Encounter for long-term (current)  use of other medications   . Routine general medical examination at a health care facility   . Tear film insufficiency, unspecified   . Unspecified disorder of kidney and ureter   . Cervical spondylosis without myelopathy   . Cervicalgia   . Disorder of bone and cartilage, unspecified   . Acute upper respiratory infections of unspecified site   . Allergic rhinitis due to pollen   . Other and unspecified hyperlipidemia   . Unspecified glaucoma   . Benign essential hypertension   . Diaphragmatic hernia without mention of obstruction or gangrene   . Diverticulosis of colon (without mention of hemorrhage)   . Pain in joint, site unspecified   . Hyperlipidemia LDL goal < 100   . Sjogren's syndrome 10/27/2012    Past Surgical History  Procedure Laterality Date  . Cholecystectomy  1993    Dr.Lindsey     Social History:   reports that she has never smoked. She does not have any smokeless tobacco history on file. She reports that she does not drink alcohol or use illicit drugs.  Family History  Problem Relation Age of Onset  . Heart attack Mother   . Pancreatitis Father   . Breast cancer Mother   . Lupus Cousin     Medications: Patient's Medications  New Prescriptions   No medications on file  Previous Medications   ASPIRIN 81 MG TABLET    Take 81 mg by mouth daily. Take one tablet once daily   CALCIUM CITRATE-VITAMIN D (CITRACAL+D)  315-200 MG-UNIT PER TABLET    Take 1 tablet by mouth 4 (four) times daily.   CHOLECALCIFEROL (VITAMIN D) 1000 UNITS TABLET    Take 1,000 Units by mouth daily. Take one tablet once daily   EZETIMIBE (ZETIA) 10 MG TABLET    Take 1 tablet (10 mg total) by mouth daily.   LOSARTAN (COZAAR) 50 MG TABLET    TAKE 1 TABLET BY MOUTH EVERY DAY for blood pressure   MISC NATURAL PRODUCTS (TART CHERRY ADVANCED) CAPS    Take 1,200 capsules by mouth daily.   MULTIPLE VITAMINS-MINERALS (CENTRUM SILVER PO)    Take 0.5 tablets by mouth daily.    OMEPRAZOLE (PRILOSEC)  20 MG CAPSULE    Take 20 mg by mouth daily. For acid reflux   PLANT STEROLS AND STANOLS (CHOLEST OFF PO)    Take 1 tablet by mouth 4 (four) times daily.    TIMOLOL (TIMOPTIC-XR) 0.5 % OPHTHALMIC GEL-FORMING    Place 1 drop into both eyes daily.   Modified Medications   No medications on file  Discontinued Medications   LOSARTAN (COZAAR) 50 MG TABLET    TAKE 1 TABLET BY MOUTH EVERY DAY     Physical Exam: Filed Vitals:   05/27/14 1216  BP: 132/82  Pulse: 92  Temp: 98.1 F (36.7 C)  TempSrc: Oral  Resp: 18  Height: 5' 5.6" (1.666 m)  Weight: 180 lb 3.2 oz (81.738 kg)  SpO2: 96%  Physical Exam  Constitutional: She is oriented to person, place, and time. She appears well-developed and well-nourished. No distress.  Cardiovascular: Normal rate, regular rhythm, normal heart sounds and intact distal pulses.   Pulmonary/Chest: Effort normal and breath sounds normal. No respiratory distress.  Abdominal: Soft. Bowel sounds are normal. She exhibits no distension and no mass. There is no tenderness.  Musculoskeletal: Normal range of motion.  Neurological: She is alert and oriented to person, place, and time. No cranial nerve deficit.  Skin: Skin is warm and dry. There is pallor.  Psychiatric: She has a normal mood and affect.     Labs reviewed: Basic Metabolic Panel:  Recent Labs  10/30/13 0805 05/23/14 0813  NA 139 142  K 4.3 4.2  CL 100 102  CO2 23 22  GLUCOSE 104* 115*  BUN 10 10  CREATININE 0.85 0.81  CALCIUM 9.7 9.6   Liver Function Tests:  Recent Labs  10/30/13 0805  AST 21  ALT 23  ALKPHOS 74  BILITOT 0.6  PROT 6.7   No results for input(s): LIPASE, AMYLASE in the last 8760 hours. No results for input(s): AMMONIA in the last 8760 hours. CBC:  Recent Labs  10/30/13 0805  WBC 8.7  NEUTROABS 4.8  HGB 14.5  HCT 43.5  MCV 89   Lipid Panel:  Recent Labs  10/30/13 0805 01/29/14 0829 05/23/14 0813  HDL 35* 39* 35*  LDLCALC 120* 110* 112*  TRIG 252*  204* 217*  CHOLHDL 5.9* 4.9* 5.4*   Lab Results  Component Value Date   HGBA1C 6.2* 05/23/2014    Assessment/Plan 1. Mixed hyperlipidemia -cont zetia until we an get form completed for praluent -cont diet and exercise -has tried fenofibrate, pravachol and zetia plus another statin--did not adequately low lipids and also developed myalgias with fibrate and statins -also taking cholest off herbal without benefit 2. Sjogren's syndrome -cont to follow with rheumatology -worse this season -encouraged use of humidifier  3. Chronic pain syndrome -cont tart cherry that she feels is helping, tylenol not  helpful, does not like narcotics   4. Essential hypertension, benign -bp at goal here with losartan--says high at home, but thinks she needs to change the battery in her machine - losartan (COZAAR) 50 MG tablet; Take 1 tablet (50 mg total) by mouth daily. TAKE 1 TABLET BY MOUTH EVERY DAY for blood pressure  Dispense: 90 tablet; Refill: 1  5. Right-sided low back pain with right-sided sciatica -better recently -cont exercise regimen   6. Hyperglycemia -cont diet and exercise  Labs/tests ordered:   Orders Placed This Encounter  Procedures  . Comprehensive metabolic panel    Standing Status: Future     Number of Occurrences:      Standing Expiration Date: 11/25/2014    Order Specific Question:  Has the patient fasted?    Answer:  Yes  . Hemoglobin A1c    Standing Status: Future     Number of Occurrences:      Standing Expiration Date: 11/25/2014  . Lipid panel    Standing Status: Future     Number of Occurrences:      Standing Expiration Date: 11/25/2014    Order Specific Question:  Has the patient fasted?    Answer:  Yes    Next appt:  3 mos with labs before  Cherokee Boccio L. Stasia Somero, D.O. [facility] [address], [place] [address] Cell Phone (Mon-Fri 8am-5pm):  [phone] On Call:  [phone] & follow prompts after 5pm &  weekends Office Phone:  [phone] Office Fax:  [phone]

## 2014-05-28 ENCOUNTER — Encounter: Payer: Self-pay | Admitting: Internal Medicine

## 2014-06-04 ENCOUNTER — Other Ambulatory Visit: Payer: Self-pay | Admitting: Internal Medicine

## 2014-06-05 ENCOUNTER — Encounter (HOSPITAL_COMMUNITY): Payer: Self-pay | Admitting: Emergency Medicine

## 2014-06-05 ENCOUNTER — Emergency Department (HOSPITAL_COMMUNITY)
Admission: EM | Admit: 2014-06-05 | Discharge: 2014-06-05 | Disposition: A | Discharge status: Home / Self Care | Payer: Medicare Other | Attending: Emergency Medicine | Admitting: Emergency Medicine

## 2014-06-05 ENCOUNTER — Emergency Department (HOSPITAL_COMMUNITY): Payer: Medicare Other

## 2014-06-05 DIAGNOSIS — I10 Essential (primary) hypertension: Secondary | ICD-10-CM | POA: Diagnosis not present

## 2014-06-05 DIAGNOSIS — Z87448 Personal history of other diseases of urinary system: Secondary | ICD-10-CM | POA: Diagnosis not present

## 2014-06-05 DIAGNOSIS — Z8709 Personal history of other diseases of the respiratory system: Secondary | ICD-10-CM | POA: Insufficient documentation

## 2014-06-05 DIAGNOSIS — H409 Unspecified glaucoma: Secondary | ICD-10-CM | POA: Diagnosis not present

## 2014-06-05 DIAGNOSIS — Z7982 Long term (current) use of aspirin: Secondary | ICD-10-CM | POA: Insufficient documentation

## 2014-06-05 DIAGNOSIS — Z9049 Acquired absence of other specified parts of digestive tract: Secondary | ICD-10-CM | POA: Diagnosis not present

## 2014-06-05 DIAGNOSIS — Z8639 Personal history of other endocrine, nutritional and metabolic disease: Secondary | ICD-10-CM | POA: Diagnosis not present

## 2014-06-05 DIAGNOSIS — R079 Chest pain, unspecified: Secondary | ICD-10-CM

## 2014-06-05 DIAGNOSIS — Z8739 Personal history of other diseases of the musculoskeletal system and connective tissue: Secondary | ICD-10-CM | POA: Insufficient documentation

## 2014-06-05 DIAGNOSIS — Z79899 Other long term (current) drug therapy: Secondary | ICD-10-CM | POA: Insufficient documentation

## 2014-06-05 DIAGNOSIS — K2970 Gastritis, unspecified, without bleeding: Secondary | ICD-10-CM

## 2014-06-05 DIAGNOSIS — R1013 Epigastric pain: Secondary | ICD-10-CM | POA: Diagnosis present

## 2014-06-05 LAB — URINALYSIS, ROUTINE W REFLEX MICROSCOPIC
Bilirubin Urine: NEGATIVE
Glucose, UA: NEGATIVE mg/dL
Ketones, ur: NEGATIVE mg/dL
Leukocytes, UA: NEGATIVE
Nitrite: NEGATIVE
Protein, ur: NEGATIVE mg/dL
Specific Gravity, Urine: 1.01 (ref 1.005–1.030)
Urobilinogen, UA: 0.2 mg/dL (ref 0.0–1.0)
pH: 7 (ref 5.0–8.0)

## 2014-06-05 LAB — COMPREHENSIVE METABOLIC PANEL
ALT: 28 U/L (ref 0–35)
AST: 27 U/L (ref 0–37)
Albumin: 4.1 g/dL (ref 3.5–5.2)
Alkaline Phosphatase: 68 U/L (ref 39–117)
Anion gap: 9 (ref 5–15)
BUN: 12 mg/dL (ref 6–23)
CO2: 22 mmol/L (ref 19–32)
Calcium: 8.9 mg/dL (ref 8.4–10.5)
Chloride: 106 mmol/L (ref 96–112)
Creatinine, Ser: 0.82 mg/dL (ref 0.50–1.10)
GFR calc Af Amer: 83 mL/min — ABNORMAL LOW (ref >90)
GFR calc non Af Amer: 72 mL/min — ABNORMAL LOW (ref >90)
Glucose, Bld: 172 mg/dL — ABNORMAL HIGH (ref 70–99)
Potassium: 3.5 mmol/L (ref 3.5–5.1)
Sodium: 137 mmol/L (ref 135–145)
Total Bilirubin: 1 mg/dL (ref 0.3–1.2)
Total Protein: 7.1 g/dL (ref 6.0–8.3)

## 2014-06-05 LAB — CBC WITH DIFFERENTIAL/PLATELET
Basophils Absolute: 0.1 10*3/uL (ref 0.0–0.1)
Basophils Relative: 1 % (ref 0–1)
Eosinophils Absolute: 0.2 10*3/uL (ref 0.0–0.7)
Eosinophils Relative: 2 % (ref 0–5)
HCT: 44.8 % (ref 36.0–46.0)
Hemoglobin: 14.8 g/dL (ref 12.0–15.0)
Lymphocytes Relative: 8 % — ABNORMAL LOW (ref 12–46)
Lymphs Abs: 0.8 10*3/uL (ref 0.7–4.0)
MCH: 30.2 pg (ref 26.0–34.0)
MCHC: 33 g/dL (ref 30.0–36.0)
MCV: 91.4 fL (ref 78.0–100.0)
Monocytes Absolute: 0.6 10*3/uL (ref 0.1–1.0)
Monocytes Relative: 6 % (ref 3–12)
Neutro Abs: 7.9 10*3/uL — ABNORMAL HIGH (ref 1.7–7.7)
Neutrophils Relative %: 83 % — ABNORMAL HIGH (ref 43–77)
Platelets: 232 10*3/uL (ref 150–400)
RBC: 4.9 MIL/uL (ref 3.87–5.11)
RDW: 12.9 % (ref 11.5–15.5)
WBC: 9.5 10*3/uL (ref 4.0–10.5)

## 2014-06-05 LAB — URINE MICROSCOPIC-ADD ON

## 2014-06-05 LAB — LIPASE, BLOOD: Lipase: 18 U/L (ref 11–59)

## 2014-06-05 LAB — I-STAT TROPONIN, ED: Troponin i, poc: 0 ng/mL (ref 0.00–0.08)

## 2014-06-05 MED ORDER — GI COCKTAIL ~~LOC~~
30.0000 mL | Freq: Once | ORAL | Status: AC
  Administered 2014-06-05: 30 mL via ORAL
  Filled 2014-06-05: qty 30

## 2014-06-05 MED ORDER — SUCRALFATE 1 G PO TABS: 1.0000 g | ORAL_TABLET | Freq: Three times a day (TID) | ORAL | Status: DC

## 2014-06-05 MED ORDER — IOHEXOL 300 MG/ML  SOLN
100.0000 mL | Freq: Once | INTRAMUSCULAR | Status: AC | PRN
  Administered 2014-06-05: 100 mL via INTRAVENOUS

## 2014-06-05 NOTE — ED Notes (Signed)
Bed: WA05 Expected date:  Expected time:  Means of arrival:  Comments: EMS 

## 2014-06-05 NOTE — Discharge Instructions (Signed)

## 2014-06-05 NOTE — ED Provider Notes (Signed)
CSN: [id number]     Arrival date & time 06/05/14  2038 History   First MD Initiated Contact with Patient 06/05/14 2044     Chief Complaint  Patient presents with  . Abdominal Pain     (Consider location/radiation/quality/duration/timing/severity/associated sxs/prior Treatment) Patient is a 69 y.o. female presenting with abdominal pain. The history is provided by the patient.  Abdominal Pain Pain location:  Epigastric Pain quality: burning and pressure   Relieved by:  Nothing Worsened by:  Nothing tried Associated symptoms: chills and fever (subjective)   Associated symptoms: no chest pain, no cough, no nausea, no shortness of breath and no vomiting     Past Medical History  Diagnosis Date  . 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 Encounter for long-term (current) use of other medications   . Routine general medical examination at a health care facility   . Tear film insufficiency, unspecified   . Unspecified disorder of kidney and ureter   . Cervical spondylosis without myelopathy   . Cervicalgia   . Disorder of bone and cartilage, unspecified   . Acute upper respiratory infections of unspecified site   . Allergic rhinitis due to pollen   . Other and unspecified hyperlipidemia   . Unspecified glaucoma   . Benign essential hypertension   . Diaphragmatic hernia without mention of obstruction or gangrene   . Diverticulosis of colon (without mention of hemorrhage)   . Pain in joint, site unspecified   . Hyperlipidemia LDL goal < 100   . Sjogren's syndrome 10/27/2012   Past Surgical History  Procedure Laterality Date  . Cholecystectomy  1993    Dr.Lindsey    Family History  Problem Relation Age of Onset  . Heart attack Mother   . Pancreatitis Father   . Breast cancer Mother   . Lupus Cousin    History  Substance Use Topics  . Smoking status: Never Smoker   . Smokeless tobacco: Not on file  . Alcohol Use: No   OB History    No data available      Review of Systems  Constitutional: Positive for fever (subjective) and chills.  Respiratory: Negative for cough and shortness of breath.   Cardiovascular: Negative for chest pain and leg swelling.  Gastrointestinal: Positive for abdominal pain. Negative for nausea and vomiting.  All other systems reviewed and are negative.     Allergies  Statins and Darvocet  Home Medications   Prior to Admission medications   Medication Sig Start Date End Date Taking? Authorizing Provider  aspirin 81 MG tablet Take 81 mg by mouth daily. Take one tablet once daily   Yes Historical Provider, MD  calcium citrate-vitamin D (CITRACAL+D) 315-200 MG-UNIT per tablet Take 1 tablet by mouth 4 (four) times daily.   Yes Historical Provider, MD  cholecalciferol (VITAMIN D) 1000 UNITS tablet Take 1,000 Units by mouth daily. Take one tablet once daily   Yes Historical Provider, MD  losartan (COZAAR) 50 MG tablet Take 1 tablet (50 mg total) by mouth daily. TAKE 1 TABLET BY MOUTH EVERY DAY for blood pressure 05/27/14  Yes Tiffany L Reed, DO  Misc Natural Products (TART CHERRY ADVANCED) CAPS Take 1,200 capsules by mouth daily.   Yes Historical Provider, MD  Multiple Vitamins-Minerals (CENTRUM SILVER PO) Take 0.5 tablets by mouth daily.    Yes Historical Provider, MD  omeprazole (PRILOSEC) 20 MG capsule Take 20 mg by mouth daily. For acid reflux   Yes Historical Provider, MD  Plant  Sterols and Stanols (CHOLEST OFF PO) Take 1 tablet by mouth 4 (four) times daily.    Yes Historical Provider, MD  timolol (TIMOPTIC-XR) 0.5 % ophthalmic gel-forming Place 1 drop into both eyes daily.  06/08/12  Yes Historical Provider, MD  ZETIA 10 MG tablet TAKE 1 TABLET (10 MG TOTAL) BY MOUTH DAILY. 06/04/14  Yes Tiffany L Reed, DO   BP 150/65 mmHg  Pulse 98  Temp(Src) 98.6 F (37 C) (Oral)  Resp 16  Ht 5' 6.5" (1.689 m)  Wt 180 lb (81.647 kg)  BMI 28.62 kg/m2  SpO2 95% Physical Exam  Constitutional: She is oriented to person,  place, and time. She appears well-developed and well-nourished. No distress.  HENT:  Head: Normocephalic and atraumatic.  Mouth/Throat: Oropharynx is clear and moist.  Eyes: EOM are normal. Pupils are equal, round, and reactive to light.  Neck: Normal range of motion. Neck supple.  Cardiovascular: Normal rate and regular rhythm.  Exam reveals no friction rub.   No murmur heard. Pulmonary/Chest: Effort normal and breath sounds normal. No respiratory distress. She has no wheezes. She has no rales.  Abdominal: Soft. She exhibits no distension. There is tenderness (mild, epigastric, RUQ). There is no rebound.  Musculoskeletal: Normal range of motion. She exhibits no edema.  Neurological: She is alert and oriented to person, place, and time.  Skin: No rash noted. She is not diaphoretic.  Nursing note and vitals reviewed.   ED Course  Procedures (including critical care time) Labs Review Labs Reviewed  CBC WITH DIFFERENTIAL/PLATELET - Abnormal; Notable for the following:    Neutrophils Relative % 83 (*)    Neutro Abs 7.9 (*)    Lymphocytes Relative 8 (*)    All other components within normal limits  COMPREHENSIVE METABOLIC PANEL  LIPASE, BLOOD  URINALYSIS, ROUTINE W REFLEX MICROSCOPIC  I-STAT TROPOININ, ED    Imaging Review Dg Chest 2 View  06/05/2014   CLINICAL DATA:  Chest pain and upper abdominal pain for 24 hr. Fever and chills this afternoon. Nausea last night.  EXAM: CHEST  2 VIEW  COMPARISON:  None.  FINDINGS: The heart size and mediastinal contours are within normal limits. Both lungs are clear. The visualized skeletal structures are unremarkable.  IMPRESSION: No active cardiopulmonary disease.   Electronically Signed   By: Lucienne Capers M.D.   On: 06/05/2014 22:07   Ct Abdomen Pelvis W Contrast  06/05/2014   CLINICAL DATA:  Chest and upper abdominal pain. Fever and chills. Nausea.  EXAM: CT ABDOMEN AND PELVIS WITH CONTRAST  TECHNIQUE: Multidetector CT imaging of the abdomen  and pelvis was performed using the standard protocol following bolus administration of intravenous contrast.  CONTRAST:  136mL OMNIPAQUE IOHEXOL 300 MG/ML  SOLN  COMPARISON:  CT 10/10/2012  FINDINGS: The included lung bases are clear.  Small hiatal hernia.  The liver demonstrates diffusely decreased density consistent with hepatic steatosis. No focal hepatic lesion. Clips in the gallbladder fossa from cholecystectomy. There is no biliary dilatation.  The spleen, pancreas, and right adrenal gland are normal. Unchanged 11 mm left adrenal nodule.  The kidneys demonstrate symmetric enhancement and excretion. There is no hydronephrosis or perinephric stranding. Unchanged 9 mm cyst in the interpolar left kidney.  The stomach is nondistended. Equivocal gastric thickening versus nondistention. Duodenum is normal. There are no dilated or thickened bowel loops. The colon is redundant with small to moderate volume of stool. A single diverticula seen in the sigmoid colon. The appendix is normal. No free air,  free fluid, or intra-abdominal fluid collection.  The abdominal aorta is normal in caliber with moderate atherosclerosis. There is no retroperitoneal adenopathy. Small fat containing umbilical hernia.  Within the pelvis the bladder is physiologically distended. The uterus is normal for age. Left ovarian cyst measures 2.8 cm, previously 2.6 cm. No pelvic free fluid.  Multilevel degenerative disc disease and facet arthropathy throughout the lumbar spine, no acute or suspicious osseous abnormality.  IMPRESSION: 1. Equivocal gastric wall thickening, may reflect gastritis, versus nondistention. Small hiatal hernia. 2. Stable chronic findings include hepatic steatosis, left adrenal adenoma, small fat containing umbilical hernia, and left ovarian cyst.   Electronically Signed   By: Jeb Levering M.D.   On: 06/05/2014 23:26     EKG Interpretation   Date/Time:  Wednesday June 05 2014 23:17:31 EST Ventricular Rate:   88 PR Interval:  235 QRS Duration: 98 QT Interval:  413 QTC Calculation: 500 R Axis:   -5 Text Interpretation:  Sinus rhythm Prolonged PR interval Low voltage,  precordial leads Borderline prolonged QT interval No prior for comparison  Confirmed by Mingo Amber  MD, Bland ([id number]) on 06/05/2014 11:23:30 PM      MDM   Final diagnoses:  Chest pain  Gastritis    62F here with abdominal pain. Epigastric, described as pressure and burning. No alleviating/exacerbating factors. Subjective fevers and chills. AFVSS here. On exam, mild epigastric and RUQ tenderness. Hx of cholecystectomy. Hx of similar pain 1 year ago with normal workup. Will give Gi cocktail and check labs and EKG. Initial troponin from triage normal, since constant, at this point doubt ACS, plus located in epigastrum not substernal.  CT shows thickened stomach c/w gastritis. Pain relieved with GI cocktail. Will give carafate, already on PPI. Stable for discharge.  Evelina Bucy, MD 06/05/14 [phone]

## 2014-06-05 NOTE — ED Notes (Signed)
Pt out of room for CT 

## 2014-06-05 NOTE — ED Notes (Signed)
Per EMS: Pt c/o upper abdominal pain x 24 hours, reports fever and chills this afternoon. Some nausea last night, none at this time. Denies diarrhea.

## 2014-06-21 ENCOUNTER — Telehealth: Payer: Self-pay

## 2014-06-21 NOTE — Telephone Encounter (Signed)
Letter received indication Felicia Hall has submitted a prior authorization request to patients insurance company and are waiting for determination for Praluent 75 mg (injectable cholesterol medication)

## 2014-06-23 NOTE — Telephone Encounter (Signed)
Ok thanks 

## 2014-07-09 ENCOUNTER — Telehealth: Payer: Self-pay

## 2014-07-09 NOTE — Telephone Encounter (Signed)
Fax received from [employer], [employer] has denied Praluent. Letter explaining denial was mailed to our office.  Per Dr.Reed "I have not gotten letter yet."  I called [facility] @ [phone] to request that denial letter be re-sent to the office.

## 2014-08-26 ENCOUNTER — Ambulatory Visit: Payer: Medicare Other | Admitting: Internal Medicine

## 2014-09-23 ENCOUNTER — Encounter: Payer: Self-pay | Admitting: Internal Medicine

## 2014-09-23 ENCOUNTER — Ambulatory Visit (INDEPENDENT_AMBULATORY_CARE_PROVIDER_SITE_OTHER): Payer: Medicare Other | Admitting: Internal Medicine

## 2014-09-23 VITALS — BP 128/88 | HR 84 | Temp 97.8°F | Resp 20 | Ht 67.0 in | Wt 181.8 lb

## 2014-09-23 DIAGNOSIS — E782 Mixed hyperlipidemia: Secondary | ICD-10-CM

## 2014-09-23 DIAGNOSIS — G8929 Other chronic pain: Secondary | ICD-10-CM | POA: Diagnosis not present

## 2014-09-23 DIAGNOSIS — G894 Chronic pain syndrome: Secondary | ICD-10-CM | POA: Diagnosis not present

## 2014-09-23 DIAGNOSIS — R739 Hyperglycemia, unspecified: Secondary | ICD-10-CM

## 2014-09-23 DIAGNOSIS — M542 Cervicalgia: Secondary | ICD-10-CM

## 2014-09-23 DIAGNOSIS — M4802 Spinal stenosis, cervical region: Secondary | ICD-10-CM

## 2014-09-23 DIAGNOSIS — I10 Essential (primary) hypertension: Secondary | ICD-10-CM

## 2014-09-23 NOTE — Progress Notes (Signed)
Felicia Hall ID: Felicia Hall, Hall   DOB: November 10, 1945, 69 y.o.   MRN: [id number]   Location:  [facility]  Goals of Care: Advanced Directive information Does Felicia Hall have an advance directive?: No, Would Felicia Hall like information on creating an advanced directive?: Yes - Educational materials given   Chief Complaint  Felicia Hall presents with  . Medical Management of Chronic Issues    3 month follow-up  . OTHER    Discuss pain in back of neck    HPI: Felicia Hall is a 69 y.o. white Hall seen in the office today for med mgt of chronic diseases.  In Feb, went to ED--had $6000 worth of testing and it was "gastritis".    Had an MRI of Felicia Hall neck in 2010.  Back then Felicia Hall couldn't turn Felicia Hall head due to severe pain.  Has had stiffness and pain on right side of Felicia Hall neck.  Feels like Felicia Hall has hypermobility in that area.  Cracks, pops, crunches and very uncomfortable.  Didn't bother Felicia Hall much until the end of 2015.  Not bothered by pain as much as the movement in the neck.  Cannot side bend or rotate well to the left.  Bothers Felicia Hall most when driving.    Lower back pain comes and goes.  Can tell if Felicia Hall does something to it.  Had a near fall on Felicia Hall laminate floor.  Someone achy since.  Takes aleve on bad days, but does not want anything stronger.  Has appt in August about whether Felicia Hall should have cataract surgery--vision is worsening.    Review of Systems:  Review of Systems  Constitutional: Negative for fever and chills.  HENT: Negative for congestion and hearing loss.   Eyes: Positive for blurred vision.  Cardiovascular: Negative for chest pain.  Gastrointestinal: Negative for abdominal pain, constipation, blood in stool and melena.  Genitourinary: Negative for dysuria, urgency and frequency.  Musculoskeletal: Positive for myalgias, back pain, joint pain and neck pain. Negative for falls.  Neurological: Negative for dizziness.  Psychiatric/Behavioral: Negative for  depression and memory loss.    Past Medical History  Diagnosis Date  . 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 Encounter for long-term (current) use of other medications   . Routine general medical examination at a health care facility   . Tear film insufficiency, unspecified   . Unspecified disorder of kidney and ureter   . Cervical spondylosis without myelopathy   . Cervicalgia   . Disorder of bone and cartilage, unspecified   . Acute upper respiratory infections of unspecified site   . Allergic rhinitis due to pollen   . Other and unspecified hyperlipidemia   . Unspecified glaucoma   . Benign essential hypertension   . Diaphragmatic hernia without mention of obstruction or gangrene   . Diverticulosis of colon (without mention of hemorrhage)   . Pain in joint, site unspecified   . Hyperlipidemia LDL goal < 100   . Sjogren's syndrome 10/27/2012    Past Surgical History  Procedure Laterality Date  . Cholecystectomy  1993    Dr.Lindsey     Allergies  Allergen Reactions  . Statins Other (See Comments)    Muscle problems  . Darvocet [Propoxyphene N-Acetaminophen] Rash   Medications: Felicia Hall's Medications  New Prescriptions   No medications on file  Previous Medications   ASPIRIN 81 MG TABLET    Take 81 mg by mouth daily. Take one tablet once daily   CALCIUM CITRATE-VITAMIN D (  CITRACAL+D) 315-200 MG-UNIT PER TABLET    Take 1 tablet by mouth 4 (four) times daily.   CHOLECALCIFEROL (VITAMIN D) 1000 UNITS TABLET    Take 1,000 Units by mouth daily. Take one tablet once daily   LOSARTAN (COZAAR) 50 MG TABLET    Take 1 tablet (50 mg total) by mouth daily. TAKE 1 TABLET BY MOUTH EVERY DAY for blood pressure   MISC NATURAL PRODUCTS (TART CHERRY ADVANCED) CAPS    Take 1,200 capsules by mouth daily.   MULTIPLE VITAMINS-MINERALS (CENTRUM SILVER PO)    Take 0.5 tablets by mouth daily.    OMEPRAZOLE (PRILOSEC) 20 MG CAPSULE    Take 20 mg by mouth daily. For acid  reflux   PLANT STEROLS AND STANOLS (CHOLEST OFF PO)    Take 1 tablet by mouth 4 (four) times daily.    TIMOLOL (TIMOPTIC-XR) 0.5 % OPHTHALMIC GEL-FORMING    Place 1 drop into both eyes daily.    ZETIA 10 MG TABLET    TAKE 1 TABLET (10 MG TOTAL) BY MOUTH DAILY.  Modified Medications   No medications on file  Discontinued Medications   SUCRALFATE (CARAFATE) 1 G TABLET    Take 1 tablet (1 g total) by mouth 4 (four) times daily -  with meals and at bedtime.    Physical Exam: Filed Vitals:   09/23/14 1453  BP: 128/88  Pulse: 84  Temp: 97.8 F (36.6 C)  TempSrc: Oral  Resp: 20  Height: 5\' 7"  (1.702 m)  Weight: 181 lb 12.8 oz (82.464 kg)  SpO2: 97%   Physical Exam  Constitutional: Felicia Hall is oriented to person, place, and time. Felicia Hall appears well-developed and well-nourished. No distress.  HENT:  Head: Normocephalic and atraumatic.  Cardiovascular: Normal rate, regular rhythm, normal heart sounds and intact distal pulses.   Pulmonary/Chest: Effort normal and breath sounds normal. No respiratory distress.  Abdominal: Soft. Bowel sounds are normal. Felicia Hall exhibits no distension. There is no tenderness.  Musculoskeletal: Felicia Hall exhibits tenderness.  Of right C3-5 area; decreased sidebending left and rotation left  Neurological: Felicia Hall is alert and oriented to person, place, and time.  Skin: Skin is warm and dry.  Psychiatric: Felicia Hall has a normal mood and affect.    Labs reviewed: Basic Metabolic Panel:  Recent Labs  10/30/13 0805 05/23/14 0813 06/05/14 2053  NA 139 142 137  K 4.3 4.2 3.5  CL 100 102 106  CO2 23 22 22   GLUCOSE 104* 115* 172*  BUN 10 10 12   CREATININE 0.85 0.81 0.82  CALCIUM 9.7 9.6 8.9   Liver Function Tests:  Recent Labs  10/30/13 0805 06/05/14 2053  AST 21 27  ALT 23 28  ALKPHOS 74 68  BILITOT 0.6 1.0  PROT 6.7 7.1  ALBUMIN  --  4.1    Recent Labs  06/05/14 2053  LIPASE 18   No results for input(s): AMMONIA in the last 8760 hours. CBC:  Recent Labs   10/30/13 0805 06/05/14 2053  WBC 8.7 9.5  NEUTROABS 4.8 7.9*  HGB 14.5 14.8  HCT 43.5 44.8  MCV 89 91.4  PLT  --  232   Lipid Panel:  Recent Labs  10/30/13 0805 01/29/14 0829 05/23/14 0813  CHOL 205* 190 190  HDL 35* Felicia* 35*  LDLCALC 120* 110* 112*  TRIG 252* 204* 217*  CHOLHDL 5.9* 4.9* 5.4*   Lab Results  Component Value Date   HGBA1C 6.2* 05/23/2014    Procedures reviewed: 10/06/2008:  MRI cervical spine w/o  contrast:  1. Degenerative cervical spondylosis with disc disease and facet disease. 2. Shallow central disc protrusion and C3-4. 3. Advanced disc disease at C4-5 and C5-6 with spinal and foraminal stenosis as described above. 4. Broad-based central and left paracentral disc protrusion at T1-2.  Assessment/Plan 1. Neck pain, chronic - is not very painful, but Felicia Hall's more bothered lack of ROM to left and hypermobility to right with a lot of crepitus -uses aleve occasionally for pain and has been seeing a chiropractor regularly for years and no longer getting relief -MRI from 2010 was reviewed and shows #2 - Ambulatory referral to Neurosurgery  2. Spinal stenosis in cervical region - requests neurosurgical opinion by Dr. Ellene Route - Ambulatory referral to Neurosurgery  3. Mixed hyperlipidemia -cont zetia and diet and exercise  4. Chronic pain syndrome -takes aleve occasionally; is not a big "pill taker"  5. Essential hypertension, benign -bp at goal today with losartan therapy  6. Hyperglycemia -watching diet and trying to exercise--goes to gym  Labs/tests ordered:  Will check labs at next appt Next appt:  3 mos  Everley Evora L. Jillyn Stacey, D.O. [facility] [address], [place] [address] Cell Phone (Mon-Fri 8am-5pm):  [phone] On Call:  [phone] & follow prompts after 5pm & weekends Office Phone:  [phone] Office Fax:  [phone]

## 2014-11-17 ENCOUNTER — Other Ambulatory Visit: Payer: Self-pay | Admitting: Internal Medicine

## 2014-12-12 HISTORY — PX: CATARACT EXTRACTION W/ INTRAOCULAR LENS  IMPLANT, BILATERAL: SHX1307

## 2014-12-24 ENCOUNTER — Other Ambulatory Visit: Payer: Medicare Other

## 2014-12-24 DIAGNOSIS — E782 Mixed hyperlipidemia: Secondary | ICD-10-CM

## 2014-12-24 DIAGNOSIS — R739 Hyperglycemia, unspecified: Secondary | ICD-10-CM

## 2014-12-25 LAB — LIPID PANEL
Chol/HDL Ratio: 5.4 ratio units — ABNORMAL HIGH (ref 0.0–4.4)
Cholesterol, Total: 182 mg/dL (ref 100–199)
HDL: 34 mg/dL — ABNORMAL LOW (ref >39)
LDL Calculated: 101 mg/dL — ABNORMAL HIGH (ref 0–99)
Triglycerides: 233 mg/dL — ABNORMAL HIGH (ref 0–149)
VLDL Cholesterol Cal: 47 mg/dL — ABNORMAL HIGH (ref 5–40)

## 2014-12-25 LAB — COMPREHENSIVE METABOLIC PANEL
ALT: 30 IU/L (ref 0–32)
AST: 26 IU/L (ref 0–40)
Albumin/Globulin Ratio: 1.7 (ref 1.1–2.5)
Albumin: 4.3 g/dL (ref 3.6–4.8)
Alkaline Phosphatase: 71 IU/L (ref 39–117)
BUN/Creatinine Ratio: 16 (ref 11–26)
BUN: 12 mg/dL (ref 8–27)
Bilirubin Total: 0.6 mg/dL (ref 0.0–1.2)
CO2: 23 mmol/L (ref 18–29)
Calcium: 9.6 mg/dL (ref 8.7–10.3)
Chloride: 100 mmol/L (ref 97–108)
Creatinine, Ser: 0.75 mg/dL (ref 0.57–1.00)
GFR calc Af Amer: 94 mL/min/{1.73_m2} (ref >59)
GFR calc non Af Amer: 82 mL/min/{1.73_m2} (ref >59)
Globulin, Total: 2.5 g/dL (ref 1.5–4.5)
Glucose: 105 mg/dL — ABNORMAL HIGH (ref 65–99)
Potassium: 4.3 mmol/L (ref 3.5–5.2)
Sodium: 140 mmol/L (ref 134–144)
Total Protein: 6.8 g/dL (ref 6.0–8.5)

## 2014-12-25 LAB — HEMOGLOBIN A1C
Est. average glucose Bld gHb Est-mCnc: 140 mg/dL
Hgb A1c MFr Bld: 6.5 % — ABNORMAL HIGH (ref 4.8–5.6)

## 2014-12-27 ENCOUNTER — Encounter: Payer: Self-pay | Admitting: Internal Medicine

## 2014-12-27 ENCOUNTER — Ambulatory Visit (INDEPENDENT_AMBULATORY_CARE_PROVIDER_SITE_OTHER): Payer: Medicare Other | Admitting: Internal Medicine

## 2014-12-27 VITALS — BP 130/98 | HR 82 | Temp 98.3°F | Resp 20 | Ht 67.0 in | Wt 182.2 lb

## 2014-12-27 DIAGNOSIS — M8580 Other specified disorders of bone density and structure, unspecified site: Secondary | ICD-10-CM

## 2014-12-27 DIAGNOSIS — E782 Mixed hyperlipidemia: Secondary | ICD-10-CM | POA: Diagnosis not present

## 2014-12-27 DIAGNOSIS — M899 Disorder of bone, unspecified: Secondary | ICD-10-CM | POA: Diagnosis not present

## 2014-12-27 DIAGNOSIS — R739 Hyperglycemia, unspecified: Secondary | ICD-10-CM

## 2014-12-27 DIAGNOSIS — M542 Cervicalgia: Secondary | ICD-10-CM

## 2014-12-27 DIAGNOSIS — G894 Chronic pain syndrome: Secondary | ICD-10-CM

## 2014-12-27 DIAGNOSIS — M3500 Sicca syndrome, unspecified: Secondary | ICD-10-CM | POA: Diagnosis not present

## 2014-12-27 DIAGNOSIS — Z23 Encounter for immunization: Secondary | ICD-10-CM

## 2014-12-27 DIAGNOSIS — G8929 Other chronic pain: Secondary | ICD-10-CM | POA: Diagnosis not present

## 2014-12-27 DIAGNOSIS — R195 Other fecal abnormalities: Secondary | ICD-10-CM

## 2014-12-27 DIAGNOSIS — I10 Essential (primary) hypertension: Secondary | ICD-10-CM

## 2014-12-27 MED ORDER — EZETIMIBE 10 MG PO TABS: ORAL_TABLET | ORAL | Status: DC

## 2014-12-27 NOTE — Patient Instructions (Addendum)
Please call [facility] to add bone density to your mammogram and ask them to send me both reports.    Try citrucel or benefiber to bulk up your stools daily.  Continue to drink plenty of water.

## 2014-12-27 NOTE — Progress Notes (Signed)
Patient ID: Felicia Hall, female   DOB: May 17, 1945, 69 y.o.   MRN: [id number]   Location:  [facility] / [facility]  Goals of Care: Advanced Directive information Does patient have an advance directive?: No, Would patient like information on creating an advanced directive?: Yes - Educational materials given   Chief Complaint  Patient presents with  . Medical Management of Chronic Issues    HPI: Patient is a 69 y.o. white female seen in the office today for med mgt of chronic diseases.   Same problems, different day.    Stomach has been irregular.  Some days diarrhea, some days constipation.  Tried probiotics w/o benefit.  Drinks plenty of water.  Saw Dr. Ellene Route about her neck and spine and he felt she did not need surgery for her problem. Has not been back to Dr. Estanislado Pandy b/c stopped plaquenil from rheumatology.  Had a full workup of her arthritis when she first went there.    Has been much better with eating less cheese.  Is taking zetia.  LDL is better at 101 from 112.  Says she may eat too many crackers.  Discussed changes in diet to lower triglycerides and glucose.  Quit going to the gym again.  Is doing weights and stretches and using exercise bike at home though.  Bone density 06/26/12 showed osteopenia by pt report.  Says she will get the bone density added to her mammogram  Has cataract surgery on her right eye upcoming next week.  Expects left is coming soon.  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  Is putting in toric lense to help with astigmatism.  Things are fuzzy so does not like to drive and hasn't driven at night in years.  Review of Systems:  Review of Systems  Constitutional: Positive for malaise/fatigue. Negative for fever and chills.  HENT: Negative for congestion and hearing loss.   Eyes: Positive for blurred vision.       Cataracts  Respiratory: Negative for cough and shortness of breath.   Cardiovascular: Negative  for chest pain and palpitations.  Gastrointestinal: Positive for diarrhea. Negative for heartburn, nausea, vomiting, abdominal pain, constipation, blood in stool and melena.  Genitourinary: Negative for dysuria, urgency and frequency.  Musculoskeletal: Positive for myalgias, back pain, joint pain and neck pain. Negative for falls.  Skin: Negative for itching and rash.  Neurological: Negative for dizziness, loss of consciousness and weakness.  Endo/Heme/Allergies: Does not bruise/bleed easily.  Psychiatric/Behavioral: Negative for depression and memory loss. The patient is not nervous/anxious and does not have insomnia.     Past Medical History  Diagnosis Date  . 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 Encounter for long-term (current) use of other medications   . Routine general medical examination at a health care facility   . Tear film insufficiency, unspecified   . Unspecified disorder of kidney and ureter   . Cervical spondylosis without myelopathy   . Cervicalgia   . Disorder of bone and cartilage, unspecified   . Acute upper respiratory infections of unspecified site   . Allergic rhinitis due to pollen   . Other and unspecified hyperlipidemia   . Unspecified glaucoma   . Benign essential hypertension   . Diaphragmatic hernia without mention of obstruction or gangrene   . Diverticulosis of colon (without mention of hemorrhage)   . Pain in joint, site unspecified   . Hyperlipidemia LDL goal < 100   . Sjogren's  syndrome 10/27/2012    Past Surgical History  Procedure Laterality Date  . Cholecystectomy  1993    Dr.Lindsey     Allergies  Allergen Reactions  . Statins Other (See Comments)    Muscle problems  . Darvocet [Propoxyphene N-Acetaminophen] Rash   Medications: Patient's Medications  New Prescriptions   No medications on file  Previous Medications   ASPIRIN 81 MG TABLET    Take 81 mg by mouth daily. Take one tablet once daily   CALCIUM  CITRATE-VITAMIN D (CITRACAL+D) 315-200 MG-UNIT PER TABLET    Take 1 tablet by mouth 4 (four) times daily.   CHOLECALCIFEROL (VITAMIN D) 1000 UNITS TABLET    Take 1,000 Units by mouth daily. Take one tablet once daily   LOSARTAN (COZAAR) 50 MG TABLET    TAKE 1 TABLET BY MOUTH EVERY DAY FOR BLOOD PRESSURE   MULTIPLE VITAMINS-MINERALS (CENTRUM SILVER PO)    Take 0.5 tablets by mouth daily.    OMEPRAZOLE (PRILOSEC) 20 MG CAPSULE    Take 20 mg by mouth daily. For acid reflux   PLANT STEROLS AND STANOLS (CHOLEST OFF PO)    Take 1 tablet by mouth 4 (four) times daily.    TIMOLOL (TIMOPTIC-XR) 0.5 % OPHTHALMIC GEL-FORMING    Place 1 drop into both eyes daily.   Modified Medications   Modified Medication Previous Medication   EZETIMIBE (ZETIA) 10 MG TABLET ZETIA 10 MG tablet      TAKE 1 TABLET (10 MG TOTAL) BY MOUTH DAILY.    TAKE 1 TABLET (10 MG TOTAL) BY MOUTH DAILY.  Discontinued Medications   MISC NATURAL PRODUCTS (TART CHERRY ADVANCED) CAPS    Take 1,200 capsules by mouth daily.    Physical Exam: Filed Vitals:   12/27/14 1033  BP: 130/98  Pulse: 82  Temp: 98.3 F (36.8 C)  TempSrc: Oral  Resp: 20  Height: 5\' 7"  (1.702 m)  Weight: 182 lb 3.2 oz (82.645 kg)  SpO2: 97%   Physical Exam  Constitutional: She is oriented to person, place, and time. She appears well-developed and well-nourished. No distress.  Cardiovascular: Normal rate, regular rhythm, normal heart sounds and intact distal pulses.   Pulmonary/Chest: Effort normal and breath sounds normal.  Abdominal: Soft. Bowel sounds are normal. She exhibits no distension and no mass. There is no tenderness.  Musculoskeletal: Normal range of motion. She exhibits tenderness.  Of neck muscles  Neurological: She is alert and oriented to person, place, and time.  Skin: Skin is warm and dry. There is pallor.  Psychiatric:  Flat affect    Labs reviewed: Basic Metabolic Panel:  Recent Labs  05/23/14 0813 06/05/14 2053 12/24/14 0808    NA 142 137 140  K 4.2 3.5 4.3  CL 102 106 100  CO2 22 22 23   GLUCOSE 115* 172* 105*  BUN 10 12 12   CREATININE 0.81 0.82 0.75  CALCIUM 9.6 8.9 9.6   Liver Function Tests:  Recent Labs  06/05/14 2053 12/24/14 0808  AST 27 26  ALT 28 30  ALKPHOS 68 71  BILITOT 1.0 0.6  PROT 7.1 6.8  ALBUMIN 4.1  --     Recent Labs  06/05/14 2053  LIPASE 18   No results for input(s): AMMONIA in the last 8760 hours. CBC:  Recent Labs  06/05/14 2053  WBC 9.5  NEUTROABS 7.9*  HGB 14.8  HCT 44.8  MCV 91.4  PLT 232   Lipid Panel:  Recent Labs  01/29/14 0829 05/23/14 0813 12/24/14 4196  CHOL 190 190 182  HDL 39* 35* 34*  LDLCALC 110* 112* 101*  TRIG 204* 217* 233*  CHOLHDL 4.9* 5.4* 5.4*   Lab Results  Component Value Date   HGBA1C 6.5* 12/24/2014   Assessment/Plan 1. Loose stools -suspect irritable bowel syndrome due to her recent anxiety about cataract surgery -she agrees this may be the cause b/c she knows she's a worrier -advised to try using a fiber supplement to bulk stools and continue good hydration -if this is ineffective once her cataract surgery is done, would consider viberzi for her  2. Neck pain, chronic -ongoing, continue f/u with chiropractor, did not have a surgical neck problem when seen by Dr. Ellene Route -cont tylenol for pain and exercises  3. Mixed hyperlipidemia - cont zetia--did make some LDL improvements, but TG remain high due to her love of carb snacks - Lipid panel; Future  4. 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 Essential hypertension, benign -cont cozaar  6. Hyperglycemia - counseled on diet and exercise and then reassess for progress - Hemoglobin A1c; Future  7. Sjogren's syndrome -cont to drink plenty of fluids, use biotene as needed, lemon lozenges  8. Osteopenia, senile -will f/u bone density when she has her mammo coming up  9. Need for influenza vaccination - Flu  Vaccine QUAD 36+ mos PF IM (Fluarix & Fluzone Quad PF) was given today (took some significant encouragement to get her to accept it)  Labs/tests ordered:  Orders Placed This Encounter  Procedures  . Flu Vaccine QUAD 36+ mos PF IM (Fluarix & Fluzone Quad PF)  . Hemoglobin A1c    Standing Status: Future     Number of Occurrences:      Standing Expiration Date: 06/26/2015  . Lipid panel    Standing Status: Future     Number of Occurrences:      Standing Expiration Date: 06/26/2015    Order Specific Question:  Has the patient fasted?    Answer:  Yes    Next appt:  3 mos for annual exam with labs before  [place]. Stephonie Wilcoxen, D.O. [facility] [address], [place] [address] Cell Phone (Mon-Fri 8am-5pm):  [phone] On Call:  [phone] & follow prompts after 5pm & weekends Office Phone:  [phone] Office Fax:  [phone]

## 2015-01-01 ENCOUNTER — Other Ambulatory Visit: Payer: Self-pay | Admitting: *Deleted

## 2015-01-01 DIAGNOSIS — M810 Age-related osteoporosis without current pathological fracture: Secondary | ICD-10-CM

## 2015-01-23 LAB — HM MAMMOGRAPHY

## 2015-01-23 LAB — HM DEXA SCAN

## 2015-01-24 ENCOUNTER — Encounter: Payer: Self-pay | Admitting: *Deleted

## 2015-01-27 ENCOUNTER — Encounter: Payer: Self-pay | Admitting: *Deleted

## 2015-02-27 ENCOUNTER — Telehealth: Payer: Self-pay | Admitting: *Deleted

## 2015-02-27 NOTE — Telephone Encounter (Signed)
Patient called and stated that she is going to have to have her Zeti changed to something different. Her copay went from $40 to $300 on this medication and she cannot afford this. Would like it changed.Patient wants to know if she can go back on the Fenofibrate instead.  Please Advise.

## 2015-02-28 NOTE — Telephone Encounter (Signed)
When does this take effect?  We could consider welchol which works similarly to zetia if it is covered, but no point in changing unless we know what is on the formulary.

## 2015-03-14 NOTE — Telephone Encounter (Signed)
Changing insurance companies in January so she will let us know then.

## 2015-04-17 DIAGNOSIS — M9905 Segmental and somatic dysfunction of pelvic region: Secondary | ICD-10-CM | POA: Diagnosis not present

## 2015-04-17 DIAGNOSIS — M9904 Segmental and somatic dysfunction of sacral region: Secondary | ICD-10-CM | POA: Diagnosis not present

## 2015-04-17 DIAGNOSIS — M5136 Other intervertebral disc degeneration, lumbar region: Secondary | ICD-10-CM | POA: Diagnosis not present

## 2015-04-17 DIAGNOSIS — M9903 Segmental and somatic dysfunction of lumbar region: Secondary | ICD-10-CM | POA: Diagnosis not present

## 2015-04-29 DIAGNOSIS — M9905 Segmental and somatic dysfunction of pelvic region: Secondary | ICD-10-CM | POA: Diagnosis not present

## 2015-04-29 DIAGNOSIS — M9903 Segmental and somatic dysfunction of lumbar region: Secondary | ICD-10-CM | POA: Diagnosis not present

## 2015-04-29 DIAGNOSIS — M9904 Segmental and somatic dysfunction of sacral region: Secondary | ICD-10-CM | POA: Diagnosis not present

## 2015-04-29 DIAGNOSIS — M5136 Other intervertebral disc degeneration, lumbar region: Secondary | ICD-10-CM | POA: Diagnosis not present

## 2015-05-05 ENCOUNTER — Other Ambulatory Visit: Payer: Medicare Other

## 2015-05-05 DIAGNOSIS — R739 Hyperglycemia, unspecified: Secondary | ICD-10-CM

## 2015-05-05 DIAGNOSIS — E782 Mixed hyperlipidemia: Secondary | ICD-10-CM

## 2015-05-06 LAB — HEMOGLOBIN A1C
Est. average glucose Bld gHb Est-mCnc: 143 mg/dL
Hgb A1c MFr Bld: 6.6 % — ABNORMAL HIGH (ref 4.8–5.6)

## 2015-05-06 LAB — LIPID PANEL
Chol/HDL Ratio: 6.3 ratio units — ABNORMAL HIGH (ref 0.0–4.4)
Cholesterol, Total: 203 mg/dL — ABNORMAL HIGH (ref 100–199)
HDL: 32 mg/dL — ABNORMAL LOW (ref >39)
LDL Calculated: 117 mg/dL — ABNORMAL HIGH (ref 0–99)
Triglycerides: 269 mg/dL — ABNORMAL HIGH (ref 0–149)
VLDL Cholesterol Cal: 54 mg/dL — ABNORMAL HIGH (ref 5–40)

## 2015-05-09 ENCOUNTER — Encounter: Payer: Self-pay | Admitting: Internal Medicine

## 2015-05-09 ENCOUNTER — Ambulatory Visit (INDEPENDENT_AMBULATORY_CARE_PROVIDER_SITE_OTHER): Payer: Medicare Other | Admitting: Internal Medicine

## 2015-05-09 VITALS — BP 158/90 | HR 79 | Temp 98.0°F | Ht 66.5 in | Wt 182.0 lb

## 2015-05-09 DIAGNOSIS — Z0000 Encounter for general adult medical examination without abnormal findings: Secondary | ICD-10-CM | POA: Diagnosis not present

## 2015-05-09 DIAGNOSIS — M9903 Segmental and somatic dysfunction of lumbar region: Secondary | ICD-10-CM | POA: Diagnosis not present

## 2015-05-09 DIAGNOSIS — E782 Mixed hyperlipidemia: Secondary | ICD-10-CM

## 2015-05-09 DIAGNOSIS — M4802 Spinal stenosis, cervical region: Secondary | ICD-10-CM

## 2015-05-09 DIAGNOSIS — M9904 Segmental and somatic dysfunction of sacral region: Secondary | ICD-10-CM | POA: Diagnosis not present

## 2015-05-09 DIAGNOSIS — M9905 Segmental and somatic dysfunction of pelvic region: Secondary | ICD-10-CM | POA: Diagnosis not present

## 2015-05-09 DIAGNOSIS — J321 Chronic frontal sinusitis: Secondary | ICD-10-CM

## 2015-05-09 DIAGNOSIS — G894 Chronic pain syndrome: Secondary | ICD-10-CM | POA: Diagnosis not present

## 2015-05-09 DIAGNOSIS — R739 Hyperglycemia, unspecified: Secondary | ICD-10-CM | POA: Diagnosis not present

## 2015-05-09 DIAGNOSIS — M5136 Other intervertebral disc degeneration, lumbar region: Secondary | ICD-10-CM | POA: Diagnosis not present

## 2015-05-09 DIAGNOSIS — M5441 Lumbago with sciatica, right side: Secondary | ICD-10-CM

## 2015-05-09 DIAGNOSIS — I10 Essential (primary) hypertension: Secondary | ICD-10-CM | POA: Diagnosis not present

## 2015-05-09 MED ORDER — LOSARTAN POTASSIUM 50 MG PO TABS: ORAL_TABLET | ORAL | Status: DC

## 2015-05-09 MED ORDER — FENOFIBRATE 145 MG PO TABS: 145.0000 mg | ORAL_TABLET | Freq: Every day | ORAL | Status: DC

## 2015-05-09 NOTE — Progress Notes (Signed)
Patient ID: Felicia Hall, female   DOB: 28-Sep-1945, 70 y.o.   MRN: [id number]   Location: [facility]  Provider: Rexene Edison. Mariea Clonts, D.O., C.M.D.  Goals of Care: Advanced Directive information Does patient have an advance directive?: No  Chief Complaint  Patient presents with  . Annual Exam    Wellness exam  . Medical Management of Chronic Issues    blood pressure, cholesterol, lab  . MMSE    30/30 passed clock drawing    HPI: Patient is a 70 y.o. female seen in the office today for an annual wellness exam and med mgt of her chronic diseases.    Depression screen [facility] 2/9 05/09/2015 05/27/2014 01/31/2014 11/01/2013 07/20/2012  Decreased Interest 0 0 1 0 1  Down, Depressed, Hopeless 0 0 1 0 0  PHQ - 2 Score 0 0 2 0 1  Altered sleeping - - 0 - -  Tired, decreased energy - - 2 - -  Change in appetite - - 0 - -  Feeling bad or failure about yourself  - - 0 - -  Trouble concentrating - - 0 - -  Moving slowly or fidgety/restless - - 0 - -  Suicidal thoughts - - 0 - -  PHQ-9 Score - - 4 - -    Fall Risk  05/11/2015 05/09/2015 09/23/2014 05/27/2014 01/31/2014  Falls in the past year? Yes No No No No  Number falls in past yr: 1 - - - -  Injury with Fall? No - - - -  Risk for fall due to : History of fall(s) - - - -  Follow up Falls evaluation completed;Falls prevention discussed;Education provided - - - -   MMSE - Mini Mental State Exam 05/09/2015  Orientation to time 5  Orientation to Place 5  Registration 3  Attention/ Calculation 5  Recall 3  Language- name 2 objects 2  Language- repeat 1  Language- follow 3 step command 3  Language- read & follow direction 1  Write a sentence 1  Copy design 1  Total score 30  passed clock   Health Maintenance  Topic Date Due  . Hepatitis C Screening  Oct 31, 1945  . TETANUS/TDAP  04/13/2015  . PNA vac Low Risk Adult (2 of 2 - PPSV23) 09/14/2015  . INFLUENZA VACCINE  11/11/2015  . MAMMOGRAM  01/22/2017  . COLONOSCOPY  12/07/2023    . DEXA SCAN  Completed  . ZOSTAVAX  Completed   Urinary incontinence?  Goes frequently, a little bit of leakage with sneezing if bladder is full.  Has been longstanding.  No urge incontinence.  Functional status?  Independent. Exercise?  Has continued her stretches, weights 2-3x per week and stationary bike daily Diet?  Trying to cut back on starchy snacks, but very hard Vision:  Did not get results from her cataract surgery she'd hoped for.  Had lens in right eye but still blurry.  Sees Dr. Prudencio Burly in March.  Right is causing some double vision.  Left is very clear.   Hearing:  No difficulty here. Dentition:  No dental problems or difficulty with chewing or swalling. Pain:  Pain is about the same.  Neck and back with degenereative discs painful.  Continues to see chiropractor, do stretches and takes aleve if really bad.  Takes sinus headaches.  Wonders about balloon sinuplasty.    Her brother wound up having quadruple bypass and he's been generally healthy.  He'd been feeling poorly when exercising and got sent directly  to hospital.  He also has diabetes and had side effects.    Stopped her zetia due to cost up to $217 this year even.  Tried the fenofibrate at the same time as her plaquenil and she had stopped b/c of the mix of the two increasing the statin effect.  She is now off plaquenil and wants to try fenofibrate again.    Chronic sinusitis and frontal headaches worse in the winter.  Wonders about balloon sinuplasty and who does it around here.  Review of Systems:  Review of Systems  Constitutional: Positive for malaise/fatigue. Negative for fever and chills.  HENT: Positive for congestion and tinnitus. Negative for ear pain, hearing loss and sore throat.   Eyes: Positive for blurred vision.       Dry eyes, glaucoma  Respiratory: Negative for cough and shortness of breath.   Cardiovascular: Negative for chest pain, palpitations and leg swelling.  Gastrointestinal: Negative for  heartburn, abdominal pain, constipation, blood in stool and melena.  Genitourinary: Negative for dysuria, urgency and frequency.       Some stress incontinence only  Musculoskeletal: Positive for back pain and joint pain. Negative for falls.  Skin: Negative for rash.  Neurological: Positive for headaches. Negative for dizziness, loss of consciousness and weakness.  Endo/Heme/Allergies: Bruises/bleeds easily.       Hyperglycemia  Psychiatric/Behavioral: Negative for depression and memory loss. The patient is not nervous/anxious and does not have insomnia.     Past Medical History  Diagnosis Date  . 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 Encounter for long-term (current) use of other medications   . Routine general medical examination at a health care facility   . Tear film insufficiency, unspecified   . Unspecified disorder of kidney and ureter   . Cervical spondylosis without myelopathy   . Cervicalgia   . Disorder of bone and cartilage, unspecified   . Acute upper respiratory infections of unspecified site   . Allergic rhinitis due to pollen   . Other and unspecified hyperlipidemia   . Unspecified glaucoma   . Benign essential hypertension   . Diaphragmatic hernia without mention of obstruction or gangrene   . Diverticulosis of colon (without mention of hemorrhage)   . Pain in joint, site unspecified   . Hyperlipidemia LDL goal < 100   . Sjogren's syndrome ([place]) 10/27/2012    Past Surgical History  Procedure Laterality Date  . Cholecystectomy  1993    Dr.Lindsey   . Cataract extraction w/ intraocular lens  implant, bilateral Bilateral 12/2014    Dr. Prudencio Burly     Allergies  Allergen Reactions  . Statins Other (See Comments)    Muscle problems  . Darvocet [Propoxyphene N-Acetaminophen] Rash      Medication List       This list is accurate as of: 05/09/15 11:59 PM.  Always use your most recent med list.               aspirin 81 MG tablet  Take  81 mg by mouth daily. Take one tablet once daily     calcium citrate-vitamin D 315-200 MG-UNIT tablet  Commonly known as:  CITRACAL+D  Take 1 tablet by mouth 4 (four) times daily.     CENTRUM SILVER PO  Take 0.5 tablets by mouth daily.     cholecalciferol 1000 units tablet  Commonly known as:  VITAMIN D  Take 1,000 Units by mouth daily. Take one tablet once daily     CHOLEST  OFF PO  Take 1 tablet by mouth 4 (four) times daily.     fenofibrate 145 MG tablet  Commonly known as:  TRICOR  Take 1 tablet (145 mg total) by mouth daily.     losartan 50 MG tablet  Commonly known as:  COZAAR  TAKE 1 TABLET BY MOUTH EVERY DAY FOR BLOOD PRESSURE     omeprazole 20 MG capsule  Commonly known as:  PRILOSEC  Take 20 mg by mouth daily. For acid reflux     timolol 0.5 % ophthalmic gel-forming  Commonly known as:  TIMOPTIC-XR  Place 1 drop into both eyes daily.        Physical Exam: Filed Vitals:   05/09/15 0917  BP: 158/90  Pulse: 79  Temp: 98 F (36.7 C)  TempSrc: Oral  Height: 5' 6.5" (1.689 m)  Weight: 182 lb (82.555 kg)  SpO2: 97%   Body mass index is 28.94 kg/(m^2). Physical Exam  Constitutional: She is oriented to person, place, and time. She appears well-developed and well-nourished. No distress.  HENT:  Head: Normocephalic and atraumatic.  Right Ear: External ear normal.  Left Ear: External ear normal.  Nose: Nose normal.  Mouth/Throat: Oropharynx is clear and moist. No oropharyngeal exudate.  Eyes: Conjunctivae and EOM are normal. Pupils are equal, round, and reactive to light. Right eye exhibits no discharge. Left eye exhibits no discharge. No scleral icterus.  Neck: Normal range of motion. Neck supple. No JVD present. No thyromegaly present.  Cardiovascular: Normal rate, regular rhythm, normal heart sounds and intact distal pulses.   Pulmonary/Chest: Effort normal and breath sounds normal. No respiratory distress.  Abdominal: Soft. Bowel sounds are normal. She  exhibits distension. She exhibits no mass. There is no tenderness. There is no rebound and no guarding.  Genitourinary:  Just had pap, pelvic and breast exam at gyn  Musculoskeletal: Normal range of motion. She exhibits no edema or tenderness.  Lymphadenopathy:    She has no cervical adenopathy.  Neurological: She is alert and oriented to person, place, and time. She has normal reflexes. No cranial nerve deficit. She exhibits normal muscle tone.  Skin: Skin is warm and dry.  Psychiatric: Her behavior is normal. Judgment and thought content normal.    Labs reviewed: Basic Metabolic Panel:  Recent Labs  05/23/14 0813 06/05/14 2053 12/24/14 0808  NA 142 137 140  K 4.2 3.5 4.3  CL 102 106 100  CO2 22 22 23   GLUCOSE 115* 172* 105*  BUN 10 12 12   CREATININE 0.81 0.82 0.75  CALCIUM 9.6 8.9 9.6   Liver Function Tests:  Recent Labs  06/05/14 2053 12/24/14 0808  AST 27 26  ALT 28 30  ALKPHOS 68 71  BILITOT 1.0 0.6  PROT 7.1 6.8  ALBUMIN 4.1 4.3    Recent Labs  06/05/14 2053  LIPASE 18   No results for input(s): AMMONIA in the last 8760 hours. CBC:  Recent Labs  06/05/14 2053  WBC 9.5  NEUTROABS 7.9*  HGB 14.8  HCT 44.8  MCV 91.4  PLT 232   Lipid Panel:  Recent Labs  05/23/14 0813 12/24/14 0808 05/05/15 0841  CHOL 190 182 203*  HDL 35* 34* 32*  LDLCALC 112* 101* 117*  TRIG 217* 233* 269*  CHOLHDL 5.4* 5.4* 6.3*   Lab Results  Component Value Date   HGBA1C 6.6* 05/05/2015    Assessment/Plan Annual exam: -just had pap, breast exam, mammogram through gyn -see hpi for other details  1. Essential  hypertension, benign - bp at goal with cozaar - EKG [id number] - Basic metabolic panel; Future  2. Mixed hyperlipidemia - panel has trended upward off all lipid-lowering meds -cost of zetia prohibitive, has not tolerated any of the statins due to myalgias -is willing to try fenofibrate again when not on plaquenil  -monitor lipids and liver and for s/s  of myalgias -if she cannot tolerate the fenofibrate, would refer to cardiology's lipid clinic due to her family history for possible injectable agent  - fenofibrate (TRICOR) 145 MG tablet; Take 1 tablet (145 mg total) by mouth daily.  Dispense: 30 tablet; Refill: 3 - Lipid panel; Future  3. Hyperglycemia -counseled on her changing diet--decreased starchy snacks and continuing her exercise regimen -may be helped a bit by fenofibrate - Basic metabolic panel; Future - Hemoglobin A1c; Future  4. Chronic frontal sinusitis -checked with [facility] ([facility]) and seems balloon sinuplasty is new procedure and should not be used in place of the old procedure unless there is true obstructive disease seen on CT -will ask CMAs to call her to find out if she desires a referral for further eval  5. Chronic pain syndrome -continues occasional nsaids for this, exercise program, chiropractic visits  6. Spinal stenosis in cervical region -is one of the sources of her pain and I suspect a contributor to her headaches, as well -continues to follow with chiropractor  7. Right-sided low back pain with right-sided sciatica -also has spurts of increased pain in her back relieved with short courses of manipulation, conservative treatments and some tramadol (but causes constipation)  Labs/tests ordered:   Orders Placed This Encounter  Procedures  . Basic metabolic panel    Standing Status: Future     Number of Occurrences:      Standing Expiration Date: 01/07/2016    Order Specific Question:  Has the patient fasted?    Answer:  Yes  . Lipid panel    Standing Status: Future     Number of Occurrences:      Standing Expiration Date: 01/07/2016    Order Specific Question:  Has the patient fasted?    Answer:  Yes  . Hemoglobin A1c    Standing Status: Future     Number of Occurrences:      Standing Expiration Date: 01/07/2016  . Hepatic Function Panel    Standing Status: Future     Number of  Occurrences:      Standing Expiration Date: 05/10/2016  . EKG 12-Lead    Next appt:  09/12/2015 for med mgt with labs before   [place]. Hendryx Ricke, D.O. [facility] [address], [place] [address] Cell Phone (Mon-Fri 8am-5pm):  [phone] On Call:  [phone] & follow prompts after 5pm & weekends Office Phone:  [phone] Office Fax:  [phone]

## 2015-05-12 ENCOUNTER — Telehealth: Payer: Self-pay

## 2015-05-12 ENCOUNTER — Telehealth: Payer: Self-pay | Admitting: *Deleted

## 2015-05-12 NOTE — Telephone Encounter (Signed)
Called pharmacy and Fenofibrate is the generic and stated that patient probably has a medication deductible she hasn't met yet. Called patient back and informed her and she stated that she is going to call insurance company.

## 2015-05-12 NOTE — Telephone Encounter (Signed)
-----   Message from Gayland Curry, DO sent at 05/11/2015  9:50 AM EST ----- Please call patient and let her know that I spoke with Dr. Erik Obey at [facility].  One of his colleagues does the balloon sinuplasty she asked about, but his impression is that it is a new procedure and it is getting overused in cases that really don't require surgical intervention.  There needs to be clear obstruction on a CT of the sinuses for it to be indicated.  Does she want a referral to ENT?

## 2015-05-12 NOTE — Telephone Encounter (Signed)
Patient called and stated that the Finofibrate is too expensive and it needs to be changed to generic Finofibrate. Please Advise.

## 2015-05-12 NOTE — Addendum Note (Signed)
Addended by: Ripley Fraise on: 05/12/2015 01:38 PM   Modules accepted: Orders

## 2015-05-12 NOTE — Telephone Encounter (Signed)
Called spoke with patient about Dr. Magdalene Molly notes about her seeing an ENT doctor she said if she wanted to see a ENT doctor  she did not need a referral and she would make the appointment herself.

## 2015-05-12 NOTE — Telephone Encounter (Signed)
Wasn't it ordered as generic?  If not, please reorder that way.  Thanks.

## 2015-05-20 DIAGNOSIS — M9901 Segmental and somatic dysfunction of cervical region: Secondary | ICD-10-CM | POA: Diagnosis not present

## 2015-05-20 DIAGNOSIS — M5033 Other cervical disc degeneration, cervicothoracic region: Secondary | ICD-10-CM | POA: Diagnosis not present

## 2015-05-20 DIAGNOSIS — M9902 Segmental and somatic dysfunction of thoracic region: Secondary | ICD-10-CM | POA: Diagnosis not present

## 2015-06-03 DIAGNOSIS — M5033 Other cervical disc degeneration, cervicothoracic region: Secondary | ICD-10-CM | POA: Diagnosis not present

## 2015-06-03 DIAGNOSIS — M9901 Segmental and somatic dysfunction of cervical region: Secondary | ICD-10-CM | POA: Diagnosis not present

## 2015-06-03 DIAGNOSIS — M9902 Segmental and somatic dysfunction of thoracic region: Secondary | ICD-10-CM | POA: Diagnosis not present

## 2015-06-10 DIAGNOSIS — M9901 Segmental and somatic dysfunction of cervical region: Secondary | ICD-10-CM | POA: Diagnosis not present

## 2015-06-10 DIAGNOSIS — M9902 Segmental and somatic dysfunction of thoracic region: Secondary | ICD-10-CM | POA: Diagnosis not present

## 2015-06-10 DIAGNOSIS — M5033 Other cervical disc degeneration, cervicothoracic region: Secondary | ICD-10-CM | POA: Diagnosis not present

## 2015-06-24 DIAGNOSIS — M9904 Segmental and somatic dysfunction of sacral region: Secondary | ICD-10-CM | POA: Diagnosis not present

## 2015-06-24 DIAGNOSIS — M5136 Other intervertebral disc degeneration, lumbar region: Secondary | ICD-10-CM | POA: Diagnosis not present

## 2015-06-24 DIAGNOSIS — M9903 Segmental and somatic dysfunction of lumbar region: Secondary | ICD-10-CM | POA: Diagnosis not present

## 2015-06-24 DIAGNOSIS — M9905 Segmental and somatic dysfunction of pelvic region: Secondary | ICD-10-CM | POA: Diagnosis not present

## 2015-07-03 DIAGNOSIS — H401122 Primary open-angle glaucoma, left eye, moderate stage: Secondary | ICD-10-CM | POA: Diagnosis not present

## 2015-07-03 DIAGNOSIS — H401112 Primary open-angle glaucoma, right eye, moderate stage: Secondary | ICD-10-CM | POA: Diagnosis not present

## 2015-07-03 DIAGNOSIS — M9903 Segmental and somatic dysfunction of lumbar region: Secondary | ICD-10-CM | POA: Diagnosis not present

## 2015-07-03 DIAGNOSIS — M9905 Segmental and somatic dysfunction of pelvic region: Secondary | ICD-10-CM | POA: Diagnosis not present

## 2015-07-03 DIAGNOSIS — M9904 Segmental and somatic dysfunction of sacral region: Secondary | ICD-10-CM | POA: Diagnosis not present

## 2015-07-03 DIAGNOSIS — M5136 Other intervertebral disc degeneration, lumbar region: Secondary | ICD-10-CM | POA: Diagnosis not present

## 2015-07-16 DIAGNOSIS — M9904 Segmental and somatic dysfunction of sacral region: Secondary | ICD-10-CM | POA: Diagnosis not present

## 2015-07-16 DIAGNOSIS — M5136 Other intervertebral disc degeneration, lumbar region: Secondary | ICD-10-CM | POA: Diagnosis not present

## 2015-07-16 DIAGNOSIS — M9903 Segmental and somatic dysfunction of lumbar region: Secondary | ICD-10-CM | POA: Diagnosis not present

## 2015-07-16 DIAGNOSIS — M9905 Segmental and somatic dysfunction of pelvic region: Secondary | ICD-10-CM | POA: Diagnosis not present

## 2015-07-30 DIAGNOSIS — M5136 Other intervertebral disc degeneration, lumbar region: Secondary | ICD-10-CM | POA: Diagnosis not present

## 2015-07-30 DIAGNOSIS — M9904 Segmental and somatic dysfunction of sacral region: Secondary | ICD-10-CM | POA: Diagnosis not present

## 2015-07-30 DIAGNOSIS — M9903 Segmental and somatic dysfunction of lumbar region: Secondary | ICD-10-CM | POA: Diagnosis not present

## 2015-07-30 DIAGNOSIS — M9905 Segmental and somatic dysfunction of pelvic region: Secondary | ICD-10-CM | POA: Diagnosis not present

## 2015-08-12 DIAGNOSIS — M9902 Segmental and somatic dysfunction of thoracic region: Secondary | ICD-10-CM | POA: Diagnosis not present

## 2015-08-12 DIAGNOSIS — M5134 Other intervertebral disc degeneration, thoracic region: Secondary | ICD-10-CM | POA: Diagnosis not present

## 2015-08-12 DIAGNOSIS — M546 Pain in thoracic spine: Secondary | ICD-10-CM | POA: Diagnosis not present

## 2015-08-28 DIAGNOSIS — M9904 Segmental and somatic dysfunction of sacral region: Secondary | ICD-10-CM | POA: Diagnosis not present

## 2015-08-28 DIAGNOSIS — M9903 Segmental and somatic dysfunction of lumbar region: Secondary | ICD-10-CM | POA: Diagnosis not present

## 2015-08-28 DIAGNOSIS — M9905 Segmental and somatic dysfunction of pelvic region: Secondary | ICD-10-CM | POA: Diagnosis not present

## 2015-08-28 DIAGNOSIS — M5136 Other intervertebral disc degeneration, lumbar region: Secondary | ICD-10-CM | POA: Diagnosis not present

## 2015-09-10 ENCOUNTER — Other Ambulatory Visit: Payer: Medicare Other

## 2015-09-10 DIAGNOSIS — E782 Mixed hyperlipidemia: Secondary | ICD-10-CM | POA: Diagnosis not present

## 2015-09-10 DIAGNOSIS — M9904 Segmental and somatic dysfunction of sacral region: Secondary | ICD-10-CM | POA: Diagnosis not present

## 2015-09-10 DIAGNOSIS — R739 Hyperglycemia, unspecified: Secondary | ICD-10-CM

## 2015-09-10 DIAGNOSIS — I10 Essential (primary) hypertension: Secondary | ICD-10-CM | POA: Diagnosis not present

## 2015-09-10 DIAGNOSIS — M9903 Segmental and somatic dysfunction of lumbar region: Secondary | ICD-10-CM | POA: Diagnosis not present

## 2015-09-10 DIAGNOSIS — M9905 Segmental and somatic dysfunction of pelvic region: Secondary | ICD-10-CM | POA: Diagnosis not present

## 2015-09-10 DIAGNOSIS — M5136 Other intervertebral disc degeneration, lumbar region: Secondary | ICD-10-CM | POA: Diagnosis not present

## 2015-09-11 LAB — HEPATIC FUNCTION PANEL
ALT: 25 IU/L (ref 0–32)
AST: 23 IU/L (ref 0–40)
Albumin: 4.4 g/dL (ref 3.6–4.8)
Alkaline Phosphatase: 53 IU/L (ref 39–117)
Bilirubin Total: 0.4 mg/dL (ref 0.0–1.2)
Bilirubin, Direct: 0.14 mg/dL (ref 0.00–0.40)
Total Protein: 6.9 g/dL (ref 6.0–8.5)

## 2015-09-11 LAB — BASIC METABOLIC PANEL
BUN/Creatinine Ratio: 16 (ref 12–28)
BUN: 16 mg/dL (ref 8–27)
CO2: 25 mmol/L (ref 18–29)
Calcium: 9.9 mg/dL (ref 8.7–10.3)
Chloride: 101 mmol/L (ref 96–106)
Creatinine, Ser: 0.99 mg/dL (ref 0.57–1.00)
GFR calc Af Amer: 67 mL/min/{1.73_m2} (ref >59)
GFR calc non Af Amer: 58 mL/min/{1.73_m2} — ABNORMAL LOW (ref >59)
Glucose: 110 mg/dL — ABNORMAL HIGH (ref 65–99)
Potassium: 4.4 mmol/L (ref 3.5–5.2)
Sodium: 142 mmol/L (ref 134–144)

## 2015-09-11 LAB — LIPID PANEL
Chol/HDL Ratio: 4.8 ratio units — ABNORMAL HIGH (ref 0.0–4.4)
Cholesterol, Total: 189 mg/dL (ref 100–199)
HDL: 39 mg/dL — ABNORMAL LOW (ref >39)
LDL Calculated: 123 mg/dL — ABNORMAL HIGH (ref 0–99)
Triglycerides: 134 mg/dL (ref 0–149)
VLDL Cholesterol Cal: 27 mg/dL (ref 5–40)

## 2015-09-11 LAB — HEMOGLOBIN A1C
Est. average glucose Bld gHb Est-mCnc: 137 mg/dL
Hgb A1c MFr Bld: 6.4 % — ABNORMAL HIGH (ref 4.8–5.6)

## 2015-09-12 ENCOUNTER — Ambulatory Visit (INDEPENDENT_AMBULATORY_CARE_PROVIDER_SITE_OTHER): Payer: Medicare Other | Admitting: Internal Medicine

## 2015-09-12 ENCOUNTER — Encounter: Payer: Self-pay | Admitting: Internal Medicine

## 2015-09-12 VITALS — BP 138/80 | HR 79 | Temp 98.2°F | Ht 67.0 in | Wt 178.0 lb

## 2015-09-12 DIAGNOSIS — G894 Chronic pain syndrome: Secondary | ICD-10-CM | POA: Diagnosis not present

## 2015-09-12 DIAGNOSIS — J320 Chronic maxillary sinusitis: Secondary | ICD-10-CM

## 2015-09-12 DIAGNOSIS — E782 Mixed hyperlipidemia: Secondary | ICD-10-CM | POA: Diagnosis not present

## 2015-09-12 DIAGNOSIS — R739 Hyperglycemia, unspecified: Secondary | ICD-10-CM | POA: Diagnosis not present

## 2015-09-12 DIAGNOSIS — M4802 Spinal stenosis, cervical region: Secondary | ICD-10-CM

## 2015-09-12 DIAGNOSIS — M8580 Other specified disorders of bone density and structure, unspecified site: Secondary | ICD-10-CM

## 2015-09-12 DIAGNOSIS — I10 Essential (primary) hypertension: Secondary | ICD-10-CM | POA: Diagnosis not present

## 2015-09-12 DIAGNOSIS — M899 Disorder of bone, unspecified: Secondary | ICD-10-CM

## 2015-09-12 NOTE — Progress Notes (Signed)
Location:  [facility] clinic Provider:  Auryn Paige L. Mariea Clonts, D.O., C.M.D.  Code Status: DNR Goals of Care:  Advanced Directives 09/12/2015  Does patient have an advance directive? No  Would patient like information on creating an advanced directive? -  educational materials were given last September, not yet done   Chief Complaint  Patient presents with  . Medical Management of Chronic Issues    91mth follow-up    HPI: Patient is a 70 y.o. female seen today for medical management of chronic diseases.    Has cut down on carbs and sweets--lost a few lbs and hasn't lost more since.  TG and hba1c improved.  LDL a little worse for some reason.   Fenofibrate makes her a little nauseated--has experimented not taking it and does not feel as nauseated the next day.  Has a lot of gas which may or may not be related.    Is doing more exercise on the bike and lifting weights more often.  Does not like the gym.    Aches and pains are about the same with neck a little better recently.  Feet hurt more and thinks arthritis in feet is getting worse.    BP was good.    Sjogren's and recent cataract surgery--still with a little after effects from that but can take a year to clear.  Sinus congestion is a persistent problem.  Previously CT did not show any obstructive disease, but problems worse and persistent so wants reassessment..    Asks about moving mammograms to every other year.  Does have family history in her mother of breast cancer--she had a mastectomy and no other necessary treatment.  I recommended she continue with annual due to family hx.  Past Medical History  Diagnosis Date  . 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 Encounter for long-term (current) use of other medications   . Routine general medical examination at a health care facility   . Tear film insufficiency, unspecified   . Unspecified disorder of kidney and ureter   . Cervical spondylosis without myelopathy     . Cervicalgia   . Disorder of bone and cartilage, unspecified   . Acute upper respiratory infections of unspecified site   . Allergic rhinitis due to pollen   . Other and unspecified hyperlipidemia   . Unspecified glaucoma   . Benign essential hypertension   . Diaphragmatic hernia without mention of obstruction or gangrene   . Diverticulosis of colon (without mention of hemorrhage)   . Pain in joint, site unspecified   . Hyperlipidemia LDL goal < 100   . Sjogren's syndrome ([place]) 10/27/2012    Past Surgical History  Procedure Laterality Date  . Cholecystectomy  1993    Dr.Lindsey   . Cataract extraction w/ intraocular lens  implant, bilateral Bilateral 12/2014    Dr. Prudencio Burly    Allergies  Allergen Reactions  . Statins Other (See Comments)    Muscle problems  . Darvocet [Propoxyphene N-Acetaminophen] Rash      Medication List       This list is accurate as of: 09/12/15 10:28 AM.  Always use your most recent med list.               aspirin 81 MG tablet  Take 81 mg by mouth daily. Take one tablet once daily     calcium citrate-vitamin D 315-200 MG-UNIT tablet  Commonly known as:  CITRACAL+D  Take 1 tablet by mouth 4 (four) times daily.  CENTRUM SILVER PO  Take 0.5 tablets by mouth daily.     cholecalciferol 1000 units tablet  Commonly known as:  VITAMIN D  Take 1,000 Units by mouth daily. Take one tablet once daily     CHOLEST OFF PO  Take 1 tablet by mouth 4 (four) times daily.     fenofibrate 145 MG tablet  Commonly known as:  TRICOR  Take 1 tablet (145 mg total) by mouth daily.     losartan 50 MG tablet  Commonly known as:  COZAAR  TAKE 1 TABLET BY MOUTH EVERY DAY FOR BLOOD PRESSURE     omeprazole 20 MG capsule  Commonly known as:  PRILOSEC  Take 20 mg by mouth daily. For acid reflux     timolol 0.5 % ophthalmic gel-forming  Commonly known as:  TIMOPTIC-XR  Place 1 drop into both eyes daily.        Review of Systems:  Review of Systems   Constitutional: Negative for fever, chills and malaise/fatigue.  HENT: Positive for congestion. Negative for hearing loss.        Dry mouth  Eyes: Positive for blurred vision.       Dry eyes and glaucoma  Cardiovascular: Negative for chest pain, palpitations and leg swelling.  Gastrointestinal: Positive for heartburn. Negative for abdominal pain, constipation, blood in stool and melena.  Genitourinary: Negative for dysuria, urgency and frequency.  Musculoskeletal: Positive for myalgias, joint pain and neck pain.  Skin: Negative for rash.  Neurological: Negative for dizziness, loss of consciousness, weakness and headaches.  Endo/Heme/Allergies: Does not bruise/bleed easily.  Psychiatric/Behavioral: Negative for depression and memory loss.    Health Maintenance  Topic Date Due  . Hepatitis C Screening  May 30, 1945  . TETANUS/TDAP  04/13/2015  . PNA vac Low Risk Adult (2 of 2 - PPSV23) 09/14/2015  . INFLUENZA VACCINE  11/11/2015  . MAMMOGRAM  01/22/2017  . COLONOSCOPY  12/07/2023  . DEXA SCAN  Completed  . ZOSTAVAX  Completed    Physical Exam: Filed Vitals:   09/12/15 1010  BP: 138/80  Pulse: 79  Temp: 98.2 F (36.8 C)  TempSrc: Oral  Height: 5\' 7"  (1.702 m)  Weight: 178 lb (80.74 kg)  SpO2: 96%   Body mass index is 27.87 kg/(m^2). Physical Exam  Constitutional: She is oriented to person, place, and time. She appears well-developed and well-nourished. No distress.  HENT:  Head: Normocephalic and atraumatic.  Right Ear: External ear normal.  Left Ear: External ear normal.  Mouth/Throat: Oropharynx is clear and moist.  Nasal congestion,sinus tenderness over maxillary sinuses  Eyes: Conjunctivae are normal.  Neck: Neck supple. No JVD present.  Cardiovascular: Normal rate, regular rhythm, normal heart sounds and intact distal pulses.   Pulmonary/Chest: Effort normal and breath sounds normal. No respiratory distress.  Abdominal: Soft. Bowel sounds are normal. She exhibits  no distension and no mass. There is no tenderness.  Musculoskeletal: Normal range of motion. She exhibits no edema or tenderness.  Lymphadenopathy:    She has no cervical adenopathy.  Neurological: She is alert and oriented to person, place, and time.  Skin: Skin is warm and dry.  Psychiatric: She has a normal mood and affect.    Labs reviewed: Basic Metabolic Panel:  Recent Labs  12/24/14 0808 09/10/15 0848  NA 140 142  K 4.3 4.4  CL 100 101  CO2 23 25  GLUCOSE 105* 110*  BUN 12 16  CREATININE 0.75 0.99  CALCIUM 9.6 9.9   Liver Function  Tests:  Recent Labs  12/24/14 0808 09/10/15 0848  AST 26 23  ALT 30 25  ALKPHOS 71 53  BILITOT 0.6 0.4  PROT 6.8 6.9  ALBUMIN 4.3 4.4   No results for input(s): LIPASE, AMYLASE in the last 8760 hours. No results for input(s): AMMONIA in the last 8760 hours. CBC: No results for input(s): WBC, NEUTROABS, HGB, HCT, MCV, PLT in the last 8760 hours. Lipid Panel:  Recent Labs  12/24/14 0808 05/05/15 0841 09/10/15 0848  CHOL 182 203* 189  HDL 34* 32* 39*  LDLCALC 101* 117* 123*  TRIG 233* 269* 134  CHOLHDL 5.4* 6.3* 4.8*   Lab Results  Component Value Date   HGBA1C 6.4* 09/10/2015    Assessment/Plan 1. Chronic maxillary sinusitis - requests a referral due to ongoing congestion and irritation, wonders if she needs a procedure to help with this chronic problem - Ambulatory referral to ENT  2. Mixed hyperlipidemia - cont fenofibrate which has helped her triglycerides along with some dietary changes and attempts at more exercise - Lipid panel; Future  3. Essential hypertension, benign - bp is controlled with cozaar - Basic metabolic panel; Future  4. Hyperglycemia - f/u labs, cont to work on avoiding too many starchy snacks - Hemoglobin A1c; Future  5. Chronic pain syndrome -unchanged, continues to use exercise for some benefit, off rheumatic drugs, has baby asa daily for heart protection  6. Spinal stenosis in  cervical region -has chronic neck pain and saw neurosurgery who did not feel she required intervention, follows with chiropractor also  7. Osteopenia, senile - cont ca with D and additional D daily, weightbearing exercises, q 2 yr bone densities  Labs/tests ordered:   Orders Placed This Encounter  Procedures  . Lipid panel    Standing Status: Future     Number of Occurrences:      Standing Expiration Date: 05/14/2016    Order Specific Question:  Has the patient fasted?    Answer:  Yes  . Hemoglobin A1c    Standing Status: Future     Number of Occurrences:      Standing Expiration Date: 05/14/2016  . Basic metabolic panel    Standing Status: Future     Number of Occurrences:      Standing Expiration Date: 05/14/2016    Order Specific Question:  Has the patient fasted?    Answer:  Yes  . Ambulatory referral to ENT    Referral Priority:  Routine    Referral Type:  Consultation    Referral Reason:  Specialty Services Required    Requested Specialty:  Otolaryngology    Number of Visits Requested:  1   Next appt:  01/15/2016    Laken Rog L. Tysheem Accardo, D.O. [facility] [address], [place] [address] Cell Phone (Mon-Fri 8am-5pm):  [phone] On Call:  [phone] & follow prompts after 5pm & weekends Office Phone:  [phone] Office Fax:  [phone]

## 2015-09-20 ENCOUNTER — Other Ambulatory Visit: Payer: Self-pay | Admitting: Internal Medicine

## 2015-09-23 DIAGNOSIS — M9904 Segmental and somatic dysfunction of sacral region: Secondary | ICD-10-CM | POA: Diagnosis not present

## 2015-09-23 DIAGNOSIS — M9903 Segmental and somatic dysfunction of lumbar region: Secondary | ICD-10-CM | POA: Diagnosis not present

## 2015-09-23 DIAGNOSIS — M5136 Other intervertebral disc degeneration, lumbar region: Secondary | ICD-10-CM | POA: Diagnosis not present

## 2015-09-23 DIAGNOSIS — M9905 Segmental and somatic dysfunction of pelvic region: Secondary | ICD-10-CM | POA: Diagnosis not present

## 2015-09-26 DIAGNOSIS — M9903 Segmental and somatic dysfunction of lumbar region: Secondary | ICD-10-CM | POA: Diagnosis not present

## 2015-09-26 DIAGNOSIS — M5136 Other intervertebral disc degeneration, lumbar region: Secondary | ICD-10-CM | POA: Diagnosis not present

## 2015-09-26 DIAGNOSIS — M9904 Segmental and somatic dysfunction of sacral region: Secondary | ICD-10-CM | POA: Diagnosis not present

## 2015-09-26 DIAGNOSIS — M9905 Segmental and somatic dysfunction of pelvic region: Secondary | ICD-10-CM | POA: Diagnosis not present

## 2015-10-06 DIAGNOSIS — M9905 Segmental and somatic dysfunction of pelvic region: Secondary | ICD-10-CM | POA: Diagnosis not present

## 2015-10-06 DIAGNOSIS — M5136 Other intervertebral disc degeneration, lumbar region: Secondary | ICD-10-CM | POA: Diagnosis not present

## 2015-10-06 DIAGNOSIS — M9904 Segmental and somatic dysfunction of sacral region: Secondary | ICD-10-CM | POA: Diagnosis not present

## 2015-10-06 DIAGNOSIS — M9903 Segmental and somatic dysfunction of lumbar region: Secondary | ICD-10-CM | POA: Diagnosis not present

## 2015-10-09 DIAGNOSIS — M9905 Segmental and somatic dysfunction of pelvic region: Secondary | ICD-10-CM | POA: Diagnosis not present

## 2015-10-09 DIAGNOSIS — M9904 Segmental and somatic dysfunction of sacral region: Secondary | ICD-10-CM | POA: Diagnosis not present

## 2015-10-09 DIAGNOSIS — M5136 Other intervertebral disc degeneration, lumbar region: Secondary | ICD-10-CM | POA: Diagnosis not present

## 2015-10-09 DIAGNOSIS — M9903 Segmental and somatic dysfunction of lumbar region: Secondary | ICD-10-CM | POA: Diagnosis not present

## 2015-10-17 DIAGNOSIS — J324 Chronic pansinusitis: Secondary | ICD-10-CM | POA: Diagnosis not present

## 2015-10-17 DIAGNOSIS — J300 Vasomotor rhinitis: Secondary | ICD-10-CM | POA: Diagnosis not present

## 2015-10-17 DIAGNOSIS — J3089 Other allergic rhinitis: Secondary | ICD-10-CM | POA: Insufficient documentation

## 2015-10-17 DIAGNOSIS — J309 Allergic rhinitis, unspecified: Secondary | ICD-10-CM | POA: Diagnosis not present

## 2015-10-22 DIAGNOSIS — M5136 Other intervertebral disc degeneration, lumbar region: Secondary | ICD-10-CM | POA: Diagnosis not present

## 2015-10-22 DIAGNOSIS — M9903 Segmental and somatic dysfunction of lumbar region: Secondary | ICD-10-CM | POA: Diagnosis not present

## 2015-10-22 DIAGNOSIS — M9905 Segmental and somatic dysfunction of pelvic region: Secondary | ICD-10-CM | POA: Diagnosis not present

## 2015-10-22 DIAGNOSIS — M9904 Segmental and somatic dysfunction of sacral region: Secondary | ICD-10-CM | POA: Diagnosis not present

## 2015-10-27 DIAGNOSIS — L820 Inflamed seborrheic keratosis: Secondary | ICD-10-CM | POA: Diagnosis not present

## 2015-10-27 DIAGNOSIS — X32XXXD Exposure to sunlight, subsequent encounter: Secondary | ICD-10-CM | POA: Diagnosis not present

## 2015-10-27 DIAGNOSIS — L821 Other seborrheic keratosis: Secondary | ICD-10-CM | POA: Diagnosis not present

## 2015-10-27 DIAGNOSIS — L570 Actinic keratosis: Secondary | ICD-10-CM | POA: Diagnosis not present

## 2015-10-27 DIAGNOSIS — D225 Melanocytic nevi of trunk: Secondary | ICD-10-CM | POA: Diagnosis not present

## 2015-11-05 DIAGNOSIS — M9904 Segmental and somatic dysfunction of sacral region: Secondary | ICD-10-CM | POA: Diagnosis not present

## 2015-11-05 DIAGNOSIS — M9905 Segmental and somatic dysfunction of pelvic region: Secondary | ICD-10-CM | POA: Diagnosis not present

## 2015-11-05 DIAGNOSIS — M9903 Segmental and somatic dysfunction of lumbar region: Secondary | ICD-10-CM | POA: Diagnosis not present

## 2015-11-05 DIAGNOSIS — M5136 Other intervertebral disc degeneration, lumbar region: Secondary | ICD-10-CM | POA: Diagnosis not present

## 2015-11-19 DIAGNOSIS — M9903 Segmental and somatic dysfunction of lumbar region: Secondary | ICD-10-CM | POA: Diagnosis not present

## 2015-11-19 DIAGNOSIS — M5136 Other intervertebral disc degeneration, lumbar region: Secondary | ICD-10-CM | POA: Diagnosis not present

## 2015-11-19 DIAGNOSIS — M9904 Segmental and somatic dysfunction of sacral region: Secondary | ICD-10-CM | POA: Diagnosis not present

## 2015-11-19 DIAGNOSIS — M9905 Segmental and somatic dysfunction of pelvic region: Secondary | ICD-10-CM | POA: Diagnosis not present

## 2015-11-28 NOTE — Addendum Note (Signed)
Addended by: Moshe Cipro MESHELL A on: 11/28/2015 04:11 PM   Modules accepted: Orders

## 2015-11-28 NOTE — Addendum Note (Signed)
Addended by: Moshe Cipro Josslynn Mentzer A on: 11/28/2015 04:11 PM   Modules accepted: Orders

## 2015-11-28 NOTE — Addendum Note (Signed)
Addended by: Moshe Cipro Lashonta Pilling A on: 11/28/2015 04:11 PM   Modules accepted: Orders

## 2015-12-02 DIAGNOSIS — M5136 Other intervertebral disc degeneration, lumbar region: Secondary | ICD-10-CM | POA: Diagnosis not present

## 2015-12-02 DIAGNOSIS — M9904 Segmental and somatic dysfunction of sacral region: Secondary | ICD-10-CM | POA: Diagnosis not present

## 2015-12-02 DIAGNOSIS — M9903 Segmental and somatic dysfunction of lumbar region: Secondary | ICD-10-CM | POA: Diagnosis not present

## 2015-12-02 DIAGNOSIS — M9905 Segmental and somatic dysfunction of pelvic region: Secondary | ICD-10-CM | POA: Diagnosis not present

## 2015-12-04 DIAGNOSIS — Z961 Presence of intraocular lens: Secondary | ICD-10-CM | POA: Diagnosis not present

## 2015-12-04 DIAGNOSIS — H04123 Dry eye syndrome of bilateral lacrimal glands: Secondary | ICD-10-CM | POA: Diagnosis not present

## 2015-12-04 DIAGNOSIS — H401113 Primary open-angle glaucoma, right eye, severe stage: Secondary | ICD-10-CM | POA: Diagnosis not present

## 2015-12-04 DIAGNOSIS — H401123 Primary open-angle glaucoma, left eye, severe stage: Secondary | ICD-10-CM | POA: Diagnosis not present

## 2015-12-11 DIAGNOSIS — M9902 Segmental and somatic dysfunction of thoracic region: Secondary | ICD-10-CM | POA: Diagnosis not present

## 2015-12-11 DIAGNOSIS — M9901 Segmental and somatic dysfunction of cervical region: Secondary | ICD-10-CM | POA: Diagnosis not present

## 2015-12-11 DIAGNOSIS — M5031 Other cervical disc degeneration,  high cervical region: Secondary | ICD-10-CM | POA: Diagnosis not present

## 2015-12-11 DIAGNOSIS — M5134 Other intervertebral disc degeneration, thoracic region: Secondary | ICD-10-CM | POA: Diagnosis not present

## 2015-12-18 DIAGNOSIS — H04123 Dry eye syndrome of bilateral lacrimal glands: Secondary | ICD-10-CM | POA: Diagnosis not present

## 2015-12-25 DIAGNOSIS — M5134 Other intervertebral disc degeneration, thoracic region: Secondary | ICD-10-CM | POA: Diagnosis not present

## 2015-12-25 DIAGNOSIS — M9902 Segmental and somatic dysfunction of thoracic region: Secondary | ICD-10-CM | POA: Diagnosis not present

## 2015-12-25 DIAGNOSIS — M9901 Segmental and somatic dysfunction of cervical region: Secondary | ICD-10-CM | POA: Diagnosis not present

## 2015-12-25 DIAGNOSIS — M5031 Other cervical disc degeneration,  high cervical region: Secondary | ICD-10-CM | POA: Diagnosis not present

## 2016-01-06 DIAGNOSIS — M5134 Other intervertebral disc degeneration, thoracic region: Secondary | ICD-10-CM | POA: Diagnosis not present

## 2016-01-06 DIAGNOSIS — M9902 Segmental and somatic dysfunction of thoracic region: Secondary | ICD-10-CM | POA: Diagnosis not present

## 2016-01-06 DIAGNOSIS — M9901 Segmental and somatic dysfunction of cervical region: Secondary | ICD-10-CM | POA: Diagnosis not present

## 2016-01-12 ENCOUNTER — Other Ambulatory Visit: Payer: Medicare Other

## 2016-01-12 DIAGNOSIS — I10 Essential (primary) hypertension: Secondary | ICD-10-CM | POA: Diagnosis not present

## 2016-01-12 DIAGNOSIS — E782 Mixed hyperlipidemia: Secondary | ICD-10-CM | POA: Diagnosis not present

## 2016-01-12 DIAGNOSIS — R739 Hyperglycemia, unspecified: Secondary | ICD-10-CM | POA: Diagnosis not present

## 2016-01-12 LAB — BASIC METABOLIC PANEL
BUN: 12 mg/dL (ref 7–25)
CO2: 26 mmol/L (ref 20–31)
Calcium: 9.6 mg/dL (ref 8.6–10.4)
Chloride: 103 mmol/L (ref 98–110)
Creat: 0.86 mg/dL (ref 0.60–0.93)
Glucose, Bld: 105 mg/dL — ABNORMAL HIGH (ref 65–99)
Potassium: 4.7 mmol/L (ref 3.5–5.3)
Sodium: 139 mmol/L (ref 135–146)

## 2016-01-12 LAB — LIPID PANEL
Cholesterol: 168 mg/dL (ref 125–200)
HDL: 31 mg/dL — ABNORMAL LOW (ref >=46)
LDL Cholesterol: 104 mg/dL (ref <130)
Total CHOL/HDL Ratio: 5.4 Ratio — ABNORMAL HIGH (ref <=5.0)
Triglycerides: 164 mg/dL — ABNORMAL HIGH (ref <150)
VLDL: 33 mg/dL — ABNORMAL HIGH (ref <30)

## 2016-01-13 LAB — HEMOGLOBIN A1C
Hgb A1c MFr Bld: 6.3 % — ABNORMAL HIGH (ref <5.7)
Mean Plasma Glucose: 134 mg/dL

## 2016-01-15 ENCOUNTER — Encounter: Payer: Self-pay | Admitting: Internal Medicine

## 2016-01-15 ENCOUNTER — Ambulatory Visit (INDEPENDENT_AMBULATORY_CARE_PROVIDER_SITE_OTHER): Payer: Medicare Other | Admitting: Internal Medicine

## 2016-01-15 VITALS — BP 140/90 | HR 83 | Temp 98.2°F | Wt 175.0 lb

## 2016-01-15 DIAGNOSIS — S0300XA Dislocation of jaw, unspecified side, initial encounter: Secondary | ICD-10-CM | POA: Diagnosis not present

## 2016-01-15 DIAGNOSIS — J320 Chronic maxillary sinusitis: Secondary | ICD-10-CM | POA: Diagnosis not present

## 2016-01-15 DIAGNOSIS — Z23 Encounter for immunization: Secondary | ICD-10-CM | POA: Diagnosis not present

## 2016-01-15 DIAGNOSIS — M4802 Spinal stenosis, cervical region: Secondary | ICD-10-CM

## 2016-01-15 DIAGNOSIS — E782 Mixed hyperlipidemia: Secondary | ICD-10-CM

## 2016-01-15 DIAGNOSIS — I10 Essential (primary) hypertension: Secondary | ICD-10-CM | POA: Diagnosis not present

## 2016-01-15 DIAGNOSIS — R739 Hyperglycemia, unspecified: Secondary | ICD-10-CM | POA: Diagnosis not present

## 2016-01-15 NOTE — Patient Instructions (Signed)
Keep up the good work on your cholesterol and sugar.

## 2016-01-15 NOTE — Progress Notes (Signed)
Location:  [facility] clinic Provider:  Jamin Humphries L. Mariea Clonts, D.O., C.M.D.  Code Status: DNR Goals of Care:  Advanced Directives 01/15/2016  Does patient have an advance directive? Yes  Copy of advanced directive(s) in chart? No - copy requested  Would patient like information on creating an advanced directive? -     Chief Complaint  Patient presents with  . Medical Management of Chronic Issues    4 mth follow-up    HPI: Patient is a 70 y.o. female seen today for medical management of chronic diseases.    Same old same old.    She is now having facial pain in her TMJ and jaw itself.  Bite guard recommended.  Thinks she bites her tongue and lips at times--will notice swelling in there.  Getting worse lately.  Jaw pops.  Feels her jaw is tilted when she opens her mouth.  Ears may hurt also when she has the pain.   Went to ENT.  Has gotten nasal spray, but it irritated her throat and made it very sore so she opted to keep with a runny nose.  CT of sinuses was recommended next.    Reviewed labs with her.    Flu shot ok.  Her neck is doing a little better with the exercises since she saw the neurosurgeon last year.   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    Past Medical History:  Diagnosis Date  . Acute upper respiratory infections of unspecified site   . Allergic rhinitis due to pollen   . Benign essential hypertension   . Cervical spondylosis without myelopathy   . Cervicalgia   . Diaphragmatic hernia without mention of obstruction or gangrene   . Disorder of bone and cartilage, unspecified   . Diverticulosis of colon (without mention of hemorrhage)   . Encounter for long-term (current) use of other medications   . Hyperlipidemia LDL goal < 100   . 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 Other and unspecified hyperlipidemia   . Pain in joint, site unspecified   . Routine general medical examination at a health care facility   . Sjogren's syndrome ([place])  10/27/2012  . Tear film insufficiency, unspecified   . Unspecified disorder of kidney and ureter   . Unspecified glaucoma(365.9)     Past Surgical History:  Procedure Laterality Date  . CATARACT EXTRACTION W/ INTRAOCULAR LENS  IMPLANT, BILATERAL Bilateral 12/2014   Dr. Prudencio Burly  . CHOLECYSTECTOMY  1993   Dr.Lindsey     Allergies  Allergen Reactions  . Statins Other (See Comments)    Muscle problems  . Darvocet [Propoxyphene N-Acetaminophen] Rash      Medication List       Accurate as of 01/15/16 10:15 AM. Always use your most recent med list.          aspirin 81 MG tablet Take 81 mg by mouth daily. Take one tablet once daily   calcium citrate-vitamin D 315-200 MG-UNIT tablet Commonly known as:  CITRACAL+D Take 1 tablet by mouth 4 (four) times daily.   CENTRUM SILVER PO Take 0.5 tablets by mouth daily.   cholecalciferol 1000 units tablet Commonly known as:  VITAMIN D Take 1,000 Units by mouth daily. Take one tablet once daily   CHOLEST OFF PO Take 1 tablet by mouth 4 (four) times daily.   fenofibrate 145 MG tablet Commonly known as:  TRICOR TAKE 1 TABLET (145 MG TOTAL) BY MOUTH DAILY.   losartan 50 MG  tablet Commonly known as:  COZAAR TAKE 1 TABLET BY MOUTH EVERY DAY FOR BLOOD PRESSURE   omeprazole 20 MG capsule Commonly known as:  PRILOSEC Take 20 mg by mouth daily. For acid reflux   RESTASIS MULTIDOSE 0.05 % ophthalmic emulsion Generic drug:  cycloSPORINE Place 1 drop into both eyes 2 (two) times daily.   timolol 0.5 % ophthalmic gel-forming Commonly known as:  TIMOPTIC-XR Place 1 drop into both eyes daily.       Review of Systems:  Review of Systems  Constitutional: Negative for chills, fever and malaise/fatigue.  HENT: Positive for congestion. Negative for hearing loss.        Dry eyes, dry mouth  Eyes: Positive for blurred vision.  Respiratory: Negative for cough, shortness of breath and wheezing.   Cardiovascular: Negative for chest pain,  palpitations and leg swelling.  Gastrointestinal: Negative for abdominal pain, blood in stool, constipation and melena.  Genitourinary: Negative for dysuria.  Musculoskeletal: Positive for back pain, joint pain, myalgias and neck pain. Negative for falls.  Skin: Negative for itching and rash.  Neurological: Positive for headaches. Negative for dizziness, loss of consciousness and weakness.  Psychiatric/Behavioral: Negative for depression and memory loss.    Health Maintenance  Topic Date Due  . Hepatitis C Screening  1945/05/13  . TETANUS/TDAP  04/13/2015  . PNA vac Low Risk Adult (2 of 2 - PPSV23) 09/14/2015  . INFLUENZA VACCINE  11/11/2015  . MAMMOGRAM  01/22/2017  . COLONOSCOPY  12/07/2023  . DEXA SCAN  Completed  . ZOSTAVAX  Completed    Physical Exam: Vitals:   01/15/16 1003  BP: 140/90  Pulse: 83  Temp: 98.2 F (36.8 C)  TempSrc: Oral  SpO2: 97%  Weight: 175 lb (79.4 kg)   Body mass index is 27.41 kg/m. Physical Exam  Constitutional: She is oriented to person, place, and time. She appears well-developed and well-nourished. No distress.  Cardiovascular: Normal rate, regular rhythm, normal heart sounds and intact distal pulses.   Pulmonary/Chest: Effort normal and breath sounds normal.  Abdominal: Soft. Bowel sounds are normal.  Musculoskeletal: Normal range of motion. She exhibits tenderness.  Bilateral TMJs  Neurological: She is alert and oriented to person, place, and time.  Skin: Skin is warm and dry. There is pallor.    Labs reviewed: Basic Metabolic Panel:  Recent Labs  09/10/15 0848 01/12/16 1017  NA 142 139  K 4.4 4.7  CL 101 103  CO2 25 26  GLUCOSE 110* 105*  BUN 16 12  CREATININE 0.99 0.86  CALCIUM 9.9 9.6   Liver Function Tests:  Recent Labs  09/10/15 0848  AST 23  ALT 25  ALKPHOS 53  BILITOT 0.4  PROT 6.9  ALBUMIN 4.4   No results for input(s): LIPASE, AMYLASE in the last 8760 hours. No results for input(s): AMMONIA in the last  8760 hours. CBC: No results for input(s): WBC, NEUTROABS, HGB, HCT, MCV, PLT in the last 8760 hours. Lipid Panel:  Recent Labs  05/05/15 0841 09/10/15 0848 01/12/16 1017  CHOL 203* 189 168  HDL 32* 39* 31*  LDLCALC 117* 123* 104  TRIG 269* 134 164*  CHOLHDL 6.3* 4.8* 5.4*   Lab Results  Component Value Date   HGBA1C 6.3 (H) 01/12/2016   Assessment/Plan 1. Benign essential hypertension -bp slightly up, but has white coat htn so continue same regimen--controlled outside of here - Basic metabolic panel; Future  2. TMJ (dislocation of temporomandibular joint), initial encounter -newly worse -agree with bite  guard as next step to try to help with pain and prevent misalignment overnight  3. Chronic maxillary sinusitis -is ongoing, did use nasal spray short term when saw Dr. Janace Hoard at ENT, but it made her throat burn so she stopped it -has not yet followed up about CT of sinuses (she's concerned that they won't be able to help her anyway)  4. Mixed hyperlipidemia - cont tricor and cholest-off which have helped to lower her values - Lipid panel; Future  5. Hyperglycemia - cont to work on decreasing snacks that brought this up, plus use tricor - Hemoglobin A1c; Future  6. Spinal stenosis in cervical region -is better recently when doing exercises from PT after neurosurgery appt  7. Need for prophylactic vaccination and inoculation against influenza -flu shot given  Labs/tests ordered:   Orders Placed This Encounter  Procedures  . Lipid panel    Standing Status:   Future    Standing Expiration Date:   01/14/2017  . Hemoglobin A1c    Standing Status:   Future    Standing Expiration Date:   01/14/2017  . Basic metabolic panel    Standing Status:   Future    Standing Expiration Date:   01/14/2017   Next appt:  4 mos for AWV/CPE, labs before   Kimari Coudriet L. Gracia Saggese, D.O. [facility] [address], [place] [address] Cell  Phone (Mon-Fri 8am-5pm):  [phone] On Call:  [phone] & follow prompts after 5pm & weekends Office Phone:  [phone] Office Fax:  [phone]

## 2016-01-20 DIAGNOSIS — M9904 Segmental and somatic dysfunction of sacral region: Secondary | ICD-10-CM | POA: Diagnosis not present

## 2016-01-20 DIAGNOSIS — M9903 Segmental and somatic dysfunction of lumbar region: Secondary | ICD-10-CM | POA: Diagnosis not present

## 2016-01-20 DIAGNOSIS — M9902 Segmental and somatic dysfunction of thoracic region: Secondary | ICD-10-CM | POA: Diagnosis not present

## 2016-01-20 DIAGNOSIS — M5136 Other intervertebral disc degeneration, lumbar region: Secondary | ICD-10-CM | POA: Diagnosis not present

## 2016-01-27 ENCOUNTER — Other Ambulatory Visit: Payer: Self-pay | Admitting: *Deleted

## 2016-01-27 DIAGNOSIS — M9902 Segmental and somatic dysfunction of thoracic region: Secondary | ICD-10-CM | POA: Diagnosis not present

## 2016-01-27 DIAGNOSIS — M9904 Segmental and somatic dysfunction of sacral region: Secondary | ICD-10-CM | POA: Diagnosis not present

## 2016-01-27 DIAGNOSIS — M5136 Other intervertebral disc degeneration, lumbar region: Secondary | ICD-10-CM | POA: Diagnosis not present

## 2016-01-27 DIAGNOSIS — M9903 Segmental and somatic dysfunction of lumbar region: Secondary | ICD-10-CM | POA: Diagnosis not present

## 2016-01-27 MED ORDER — LOSARTAN POTASSIUM 50 MG PO TABS: ORAL_TABLET | ORAL | 3 refills | Status: DC

## 2016-01-27 NOTE — Telephone Encounter (Signed)
[facility] 

## 2016-02-10 DIAGNOSIS — M9902 Segmental and somatic dysfunction of thoracic region: Secondary | ICD-10-CM | POA: Diagnosis not present

## 2016-02-10 DIAGNOSIS — M5134 Other intervertebral disc degeneration, thoracic region: Secondary | ICD-10-CM | POA: Diagnosis not present

## 2016-02-10 DIAGNOSIS — J3089 Other allergic rhinitis: Secondary | ICD-10-CM | POA: Diagnosis not present

## 2016-02-10 DIAGNOSIS — J329 Chronic sinusitis, unspecified: Secondary | ICD-10-CM | POA: Diagnosis not present

## 2016-02-18 DIAGNOSIS — M5134 Other intervertebral disc degeneration, thoracic region: Secondary | ICD-10-CM | POA: Diagnosis not present

## 2016-02-18 DIAGNOSIS — M9902 Segmental and somatic dysfunction of thoracic region: Secondary | ICD-10-CM | POA: Diagnosis not present

## 2016-03-02 DIAGNOSIS — M9902 Segmental and somatic dysfunction of thoracic region: Secondary | ICD-10-CM | POA: Diagnosis not present

## 2016-03-02 DIAGNOSIS — M5134 Other intervertebral disc degeneration, thoracic region: Secondary | ICD-10-CM | POA: Diagnosis not present

## 2016-03-17 DIAGNOSIS — M5134 Other intervertebral disc degeneration, thoracic region: Secondary | ICD-10-CM | POA: Diagnosis not present

## 2016-03-17 DIAGNOSIS — M9902 Segmental and somatic dysfunction of thoracic region: Secondary | ICD-10-CM | POA: Diagnosis not present

## 2016-03-17 DIAGNOSIS — H401123 Primary open-angle glaucoma, left eye, severe stage: Secondary | ICD-10-CM | POA: Diagnosis not present

## 2016-03-17 DIAGNOSIS — H401113 Primary open-angle glaucoma, right eye, severe stage: Secondary | ICD-10-CM | POA: Diagnosis not present

## 2016-03-23 ENCOUNTER — Other Ambulatory Visit: Payer: Self-pay | Admitting: Internal Medicine

## 2016-03-24 DIAGNOSIS — M9902 Segmental and somatic dysfunction of thoracic region: Secondary | ICD-10-CM | POA: Diagnosis not present

## 2016-03-24 DIAGNOSIS — M5134 Other intervertebral disc degeneration, thoracic region: Secondary | ICD-10-CM | POA: Diagnosis not present

## 2016-03-31 DIAGNOSIS — M13171 Monoarthritis, not elsewhere classified, right ankle and foot: Secondary | ICD-10-CM | POA: Diagnosis not present

## 2016-03-31 DIAGNOSIS — J029 Acute pharyngitis, unspecified: Secondary | ICD-10-CM | POA: Diagnosis not present

## 2016-04-01 DIAGNOSIS — M9905 Segmental and somatic dysfunction of pelvic region: Secondary | ICD-10-CM | POA: Diagnosis not present

## 2016-04-01 DIAGNOSIS — M13171 Monoarthritis, not elsewhere classified, right ankle and foot: Secondary | ICD-10-CM | POA: Diagnosis not present

## 2016-04-01 DIAGNOSIS — M5136 Other intervertebral disc degeneration, lumbar region: Secondary | ICD-10-CM | POA: Diagnosis not present

## 2016-04-01 DIAGNOSIS — M9903 Segmental and somatic dysfunction of lumbar region: Secondary | ICD-10-CM | POA: Diagnosis not present

## 2016-04-01 DIAGNOSIS — R55 Syncope and collapse: Secondary | ICD-10-CM | POA: Diagnosis not present

## 2016-04-01 DIAGNOSIS — M9904 Segmental and somatic dysfunction of sacral region: Secondary | ICD-10-CM | POA: Diagnosis not present

## 2016-04-07 DIAGNOSIS — M9905 Segmental and somatic dysfunction of pelvic region: Secondary | ICD-10-CM | POA: Diagnosis not present

## 2016-04-07 DIAGNOSIS — M9903 Segmental and somatic dysfunction of lumbar region: Secondary | ICD-10-CM | POA: Diagnosis not present

## 2016-04-07 DIAGNOSIS — M5136 Other intervertebral disc degeneration, lumbar region: Secondary | ICD-10-CM | POA: Diagnosis not present

## 2016-04-07 DIAGNOSIS — M9904 Segmental and somatic dysfunction of sacral region: Secondary | ICD-10-CM | POA: Diagnosis not present

## 2016-04-09 ENCOUNTER — Ambulatory Visit (INDEPENDENT_AMBULATORY_CARE_PROVIDER_SITE_OTHER): Payer: Medicare Other | Admitting: Internal Medicine

## 2016-04-09 ENCOUNTER — Encounter: Payer: Self-pay | Admitting: Internal Medicine

## 2016-04-09 VITALS — BP 128/80 | HR 74 | Temp 97.5°F | Wt 176.0 lb

## 2016-04-09 DIAGNOSIS — M9903 Segmental and somatic dysfunction of lumbar region: Secondary | ICD-10-CM | POA: Diagnosis not present

## 2016-04-09 DIAGNOSIS — R55 Syncope and collapse: Secondary | ICD-10-CM

## 2016-04-09 DIAGNOSIS — M5134 Other intervertebral disc degeneration, thoracic region: Secondary | ICD-10-CM | POA: Diagnosis not present

## 2016-04-09 DIAGNOSIS — M9902 Segmental and somatic dysfunction of thoracic region: Secondary | ICD-10-CM | POA: Diagnosis not present

## 2016-04-09 DIAGNOSIS — J069 Acute upper respiratory infection, unspecified: Secondary | ICD-10-CM

## 2016-04-09 DIAGNOSIS — M5136 Other intervertebral disc degeneration, lumbar region: Secondary | ICD-10-CM | POA: Diagnosis not present

## 2016-04-09 DIAGNOSIS — M10072 Idiopathic gout, left ankle and foot: Secondary | ICD-10-CM

## 2016-04-09 NOTE — Progress Notes (Signed)
Location:  [facility] clinic Provider: Angelamarie Avakian L. Mariea Clonts, D.O., C.M.D.  Code Status: DNR Goals of Care:  Advanced Directives 01/15/2016  Does Patient Have a Medical Advance Directive? Yes  Copy of [place] in Chart? No - copy requested  Would patient like information on creating a medical advance directive? -     Chief Complaint  Patient presents with  . Acute Visit    sinus infection, right big toe swollen, red    HPI: Patient is a 70 y.o. female seen today for an acute visit for three concerns:  Right great toe, sinuses, syncope.  All started with her left great toe.  Mildly red over MTP, last week was swollen tender and could hardly walk--went to urgent care and has been taking indomethacin 4x per day but it upsets her stomach, but now she's only taking it three times per day.  Still swollen and painful just above her toes.  Has had some discomofort in the great toe for a few months, but no redness, warmth swelling and had resolved with ice.  sometimes will hurt like nail is pushing on the toe.    She had a bad cold last week when she went to the urgent care--was advised to take chlortrimetron--took that night before bed and took another indomethacin--felt faint when she was out in the kitchen planning to eat something--was lightheaded, then passed out on the floor.  She suspected it was blood pressure issue.  She had just taken her bp meds at dinner also.   She then went back that following morning and bp was 104/68 which is quite low.  When she passed out, she landed on her butt, hit her left elbow and grazed her head on something in her left temple area.  Woke up and still felt faint-feeling so sat there a while.  Thinks she only out a short time.  She had also taken her timolol.  Appetite had not been affected so eating and drinking ok.  Stomach was empty at that time.      Past Medical History:  Diagnosis Date  . Acute upper respiratory infections of unspecified site    . Allergic rhinitis due to pollen   . Benign essential hypertension   . Cervical spondylosis without myelopathy   . Cervicalgia   . Diaphragmatic hernia without mention of obstruction or gangrene   . Disorder of bone and cartilage, unspecified   . Diverticulosis of colon (without mention of hemorrhage)   . Encounter for long-term (current) use of other medications   . Hyperlipidemia LDL goal < 100   . 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 Other and unspecified hyperlipidemia   . Pain in joint, site unspecified   . Routine general medical examination at a health care facility   . Sjogren's syndrome ([place]) 10/27/2012  . Tear film insufficiency, unspecified   . Unspecified disorder of kidney and ureter   . Unspecified glaucoma(365.9)     Past Surgical History:  Procedure Laterality Date  . CATARACT EXTRACTION W/ INTRAOCULAR LENS  IMPLANT, BILATERAL Bilateral 12/2014   Dr. Prudencio Burly  . CHOLECYSTECTOMY  1993   Dr.Lindsey     Allergies  Allergen Reactions  . Statins Other (See Comments)    Muscle problems  . Darvocet [Propoxyphene N-Acetaminophen] Rash    Allergies as of 04/09/2016      Reactions   Statins Other (See Comments)   Muscle problems   Darvocet [propoxyphene N-acetaminophen] Rash  Medication List       Accurate as of 04/09/16  9:10 AM. Always use your most recent med list.          aspirin 81 MG tablet Take 81 mg by mouth daily. Take one tablet once daily   calcium citrate-vitamin D 315-200 MG-UNIT tablet Commonly known as:  CITRACAL+D Take 1 tablet by mouth 4 (four) times daily.   CENTRUM SILVER PO Take 0.5 tablets by mouth daily.   cholecalciferol 1000 units tablet Commonly known as:  VITAMIN D Take 1,000 Units by mouth daily. Take one tablet once daily   CHOLEST OFF PO Take 1 tablet by mouth 4 (four) times daily.   fenofibrate 145 MG tablet Commonly known as:  TRICOR TAKE 1 TABLET (145 MG TOTAL) BY MOUTH DAILY.     losartan 50 MG tablet Commonly known as:  COZAAR Take one tablet by mouth once daily for blood pressure   omeprazole 20 MG capsule Commonly known as:  PRILOSEC Take 20 mg by mouth daily. For acid reflux   RESTASIS MULTIDOSE 0.05 % ophthalmic emulsion Generic drug:  cycloSPORINE Place 1 drop into both eyes 2 (two) times daily.   timolol 0.5 % ophthalmic gel-forming Commonly known as:  TIMOPTIC-XR Place 1 drop into both eyes daily.       Review of Systems:  Review of Systems  Constitutional: Negative for chills, fever and malaise/fatigue.  Eyes:       Glaucoma  Respiratory: Negative for shortness of breath.   Cardiovascular: Negative for chest pain, palpitations and leg swelling.  Musculoskeletal: Positive for joint pain.       Left great toe metatarsal joint erythema and mild swelling of metatarsal region of entire foot, very mild warmth of first MTP only  Neurological: Positive for dizziness and loss of consciousness. Negative for tingling, tremors, sensory change, speech change, focal weakness, seizures, weakness and headaches.  Psychiatric/Behavioral: Negative for depression and memory loss.    Health Maintenance  Topic Date Due  . Hepatitis C Screening  1945/06/02  . TETANUS/TDAP  04/13/2015  . PNA vac Low Risk Adult (2 of 2 - PPSV23) 09/14/2015  . MAMMOGRAM  01/22/2017  . COLONOSCOPY  12/07/2023  . INFLUENZA VACCINE  Completed  . DEXA SCAN  Completed  . ZOSTAVAX  Completed    Physical Exam: Vitals:   04/09/16 0842  BP: 128/80  Pulse: 74  Temp: 97.5 F (36.4 C)  TempSrc: Oral  SpO2: 98%  Weight: 176 lb (79.8 kg)   Body mass index is 27.57 kg/m. Physical Exam  Constitutional: She is oriented to person, place, and time. She appears well-developed and well-nourished. No distress.  HENT:  Sinus congestion  Cardiovascular: Normal rate, regular rhythm, normal heart sounds and intact distal pulses.   Pulmonary/Chest: Effort normal and breath sounds normal.  No respiratory distress.  Musculoskeletal: Normal range of motion. She exhibits no tenderness.  Neurological: She is alert and oriented to person, place, and time.  Skin: Skin is warm and dry. Capillary refill takes less than 2 seconds. There is erythema. There is pallor.  Psychiatric: She has a normal mood and affect.    Labs reviewed: Basic Metabolic Panel:  Recent Labs  09/10/15 0848 01/12/16 1017  NA 142 139  K 4.4 4.7  CL 101 103  CO2 25 26  GLUCOSE 110* 105*  BUN 16 12  CREATININE 0.99 0.86  CALCIUM 9.9 9.6   Liver Function Tests:  Recent Labs  09/10/15 0848  AST 23  ALT 25  ALKPHOS 53  BILITOT 0.4  PROT 6.9  ALBUMIN 4.4   No results for input(s): LIPASE, AMYLASE in the last 8760 hours. No results for input(s): AMMONIA in the last 8760 hours. CBC: No results for input(s): WBC, NEUTROABS, HGB, HCT, MCV, PLT in the last 8760 hours. Lipid Panel:  Recent Labs  05/05/15 0841 09/10/15 0848 01/12/16 1017  CHOL 203* 189 168  HDL 32* 39* 31*  LDLCALC 117* 123* 104  TRIG 269* 134 164*  CHOLHDL 6.3* 4.8* 5.4*   Lab Results  Component Value Date   HGBA1C 6.3 (H) 01/12/2016   EKG today:    Assessment/Plan 1. Acute idiopathic gout involving toe of left foot -we discussed acute treatment options -she has been taking nsaid but has gi upset from this including nausea and diarrhea -she cannot take prednisone due to her glaucoma -we discussed colcrys, but since that may also cause diarrhea, we opted to cont the indomethacin for a few more days by which point I expect her flare to resolve -we also discussed chronic therapy to prevent flares, but she wants to hold off on that to see if she gets another flare in the future -she is not on any culprit meds, but does have other autoimmune disease  2. Acute upper respiratory infection -ongoing, did not tolerate chlortimetron -reviewed other options including nettipot, warm humidity -does not have much cough, mostly  sinus/head congestion  3. Vasovagal syncope - suspect this is cause due to not having eaten or drunk anything and taking several new meds together interacting with her bp meds and glaucoma drops (hypotension) - EKG 12-Lead was unremarkable and unchanged from last--NSR with first degree AV block, rate 77bpm  Labs/tests ordered:   Orders Placed This Encounter  Procedures  . EKG 12-Lead    Order Specific Question:   Where should this test be performed    Answer:   Other   Next appt:  05/25/2016  Staley Lunz L. Deasia Chiu, D.O. [facility] [address], [place] [address] Cell Phone (Mon-Fri 8am-5pm):  [phone] On Call:  [phone] & follow prompts after 5pm & weekends Office Phone:  [phone] Office Fax:  [phone]

## 2016-04-16 DIAGNOSIS — Z01419 Encounter for gynecological examination (general) (routine) without abnormal findings: Secondary | ICD-10-CM | POA: Diagnosis not present

## 2016-04-16 DIAGNOSIS — M8580 Other specified disorders of bone density and structure, unspecified site: Secondary | ICD-10-CM | POA: Diagnosis not present

## 2016-04-16 DIAGNOSIS — Z1231 Encounter for screening mammogram for malignant neoplasm of breast: Secondary | ICD-10-CM | POA: Diagnosis not present

## 2016-04-16 LAB — HM MAMMOGRAPHY: HM Mammogram: NORMAL (ref 0–4)

## 2016-04-20 DIAGNOSIS — M9905 Segmental and somatic dysfunction of pelvic region: Secondary | ICD-10-CM | POA: Diagnosis not present

## 2016-04-20 DIAGNOSIS — M9904 Segmental and somatic dysfunction of sacral region: Secondary | ICD-10-CM | POA: Diagnosis not present

## 2016-04-20 DIAGNOSIS — M9903 Segmental and somatic dysfunction of lumbar region: Secondary | ICD-10-CM | POA: Diagnosis not present

## 2016-04-20 DIAGNOSIS — M5136 Other intervertebral disc degeneration, lumbar region: Secondary | ICD-10-CM | POA: Diagnosis not present

## 2016-04-21 ENCOUNTER — Telehealth: Payer: Self-pay | Admitting: *Deleted

## 2016-04-21 NOTE — Telephone Encounter (Signed)
Patient called and stated that she saw you last week regarding gout. Stated she took the medication and it cleared up but when she woke this morning her Right Big Toe was red, Painful and swollen again. Wants a Rx before it gets worse. Please Advise.

## 2016-04-21 NOTE — Telephone Encounter (Signed)
Let's start her on colcrys 0.6mg --take two tabs when she gets it, then one daily until resolved. After the flare is over, start allopurinol 100mg  po daily to prevent flares. When she is seen next, I'll check her uric acid.

## 2016-04-22 MED ORDER — COLCHICINE 0.6 MG PO TABS: ORAL_TABLET | ORAL | 0 refills | Status: DC

## 2016-04-22 MED ORDER — ALLOPURINOL 100 MG PO TABS: ORAL_TABLET | ORAL | 5 refills | Status: DC

## 2016-04-22 NOTE — Telephone Encounter (Signed)
Patient notified and agreed. Faxed Rx's to pharmacy.

## 2016-05-06 DIAGNOSIS — M5136 Other intervertebral disc degeneration, lumbar region: Secondary | ICD-10-CM | POA: Diagnosis not present

## 2016-05-06 DIAGNOSIS — M9905 Segmental and somatic dysfunction of pelvic region: Secondary | ICD-10-CM | POA: Diagnosis not present

## 2016-05-06 DIAGNOSIS — M9903 Segmental and somatic dysfunction of lumbar region: Secondary | ICD-10-CM | POA: Diagnosis not present

## 2016-05-06 DIAGNOSIS — M9904 Segmental and somatic dysfunction of sacral region: Secondary | ICD-10-CM | POA: Diagnosis not present

## 2016-05-18 DIAGNOSIS — M5136 Other intervertebral disc degeneration, lumbar region: Secondary | ICD-10-CM | POA: Diagnosis not present

## 2016-05-18 DIAGNOSIS — M9905 Segmental and somatic dysfunction of pelvic region: Secondary | ICD-10-CM | POA: Diagnosis not present

## 2016-05-18 DIAGNOSIS — M9904 Segmental and somatic dysfunction of sacral region: Secondary | ICD-10-CM | POA: Diagnosis not present

## 2016-05-18 DIAGNOSIS — M9903 Segmental and somatic dysfunction of lumbar region: Secondary | ICD-10-CM | POA: Diagnosis not present

## 2016-05-23 ENCOUNTER — Encounter (HOSPITAL_COMMUNITY): Payer: Self-pay | Admitting: *Deleted

## 2016-05-23 ENCOUNTER — Emergency Department (HOSPITAL_COMMUNITY)
Admission: EM | Admit: 2016-05-23 | Discharge: 2016-05-23 | Disposition: A | Discharge status: Home / Self Care | Payer: Medicare Other | Attending: Emergency Medicine | Admitting: Emergency Medicine

## 2016-05-23 ENCOUNTER — Emergency Department (HOSPITAL_COMMUNITY): Payer: Medicare Other

## 2016-05-23 DIAGNOSIS — R112 Nausea with vomiting, unspecified: Secondary | ICD-10-CM | POA: Insufficient documentation

## 2016-05-23 DIAGNOSIS — R1010 Upper abdominal pain, unspecified: Secondary | ICD-10-CM | POA: Diagnosis not present

## 2016-05-23 DIAGNOSIS — K2970 Gastritis, unspecified, without bleeding: Secondary | ICD-10-CM | POA: Diagnosis not present

## 2016-05-23 DIAGNOSIS — R1111 Vomiting without nausea: Secondary | ICD-10-CM | POA: Diagnosis not present

## 2016-05-23 DIAGNOSIS — Z79899 Other long term (current) drug therapy: Secondary | ICD-10-CM | POA: Diagnosis not present

## 2016-05-23 DIAGNOSIS — Z7982 Long term (current) use of aspirin: Secondary | ICD-10-CM | POA: Diagnosis not present

## 2016-05-23 DIAGNOSIS — R1110 Vomiting, unspecified: Secondary | ICD-10-CM | POA: Diagnosis not present

## 2016-05-23 DIAGNOSIS — R52 Pain, unspecified: Secondary | ICD-10-CM

## 2016-05-23 LAB — COMPREHENSIVE METABOLIC PANEL
ALT: 26 U/L (ref 14–54)
AST: 29 U/L (ref 15–41)
Albumin: 4.5 g/dL (ref 3.5–5.0)
Alkaline Phosphatase: 48 U/L (ref 38–126)
Anion gap: 9 (ref 5–15)
BUN: 16 mg/dL (ref 6–20)
CO2: 22 mmol/L (ref 22–32)
Calcium: 9.3 mg/dL (ref 8.9–10.3)
Chloride: 104 mmol/L (ref 101–111)
Creatinine, Ser: 0.97 mg/dL (ref 0.44–1.00)
GFR calc Af Amer: >60 mL/min (ref >60)
GFR calc non Af Amer: 58 mL/min — ABNORMAL LOW (ref >60)
Glucose, Bld: 169 mg/dL — ABNORMAL HIGH (ref 65–99)
Potassium: 3.6 mmol/L (ref 3.5–5.1)
Sodium: 135 mmol/L (ref 135–145)
Total Bilirubin: 0.8 mg/dL (ref 0.3–1.2)
Total Protein: 7.6 g/dL (ref 6.5–8.1)

## 2016-05-23 LAB — CBC
HCT: 43.7 % (ref 36.0–46.0)
Hemoglobin: 14.6 g/dL (ref 12.0–15.0)
MCH: 30.4 pg (ref 26.0–34.0)
MCHC: 33.4 g/dL (ref 30.0–36.0)
MCV: 91 fL (ref 78.0–100.0)
Platelets: 277 10*3/uL (ref 150–400)
RBC: 4.8 MIL/uL (ref 3.87–5.11)
RDW: 13.3 % (ref 11.5–15.5)
WBC: 16.3 10*3/uL — ABNORMAL HIGH (ref 4.0–10.5)

## 2016-05-23 LAB — URINALYSIS, ROUTINE W REFLEX MICROSCOPIC
Bilirubin Urine: NEGATIVE
Glucose, UA: NEGATIVE mg/dL
Hgb urine dipstick: NEGATIVE
Ketones, ur: NEGATIVE mg/dL
Leukocytes, UA: NEGATIVE
Nitrite: NEGATIVE
Protein, ur: NEGATIVE mg/dL
Specific Gravity, Urine: 1.014 (ref 1.005–1.030)
pH: 8 (ref 5.0–8.0)

## 2016-05-23 LAB — I-STAT TROPONIN, ED: Troponin i, poc: 0 ng/mL (ref 0.00–0.08)

## 2016-05-23 LAB — LIPASE, BLOOD: Lipase: 18 U/L (ref 11–51)

## 2016-05-23 MED ORDER — DICYCLOMINE HCL 10 MG/ML IM SOLN
20.0000 mg | Freq: Once | INTRAMUSCULAR | Status: AC
  Administered 2016-05-23: 20 mg via INTRAMUSCULAR
  Filled 2016-05-23: qty 2

## 2016-05-23 MED ORDER — ACETAMINOPHEN 500 MG PO TABS
1000.0000 mg | ORAL_TABLET | Freq: Once | ORAL | Status: AC
  Administered 2016-05-23: 1000 mg via ORAL
  Filled 2016-05-23: qty 2

## 2016-05-23 MED ORDER — SUCRALFATE 1 GM/10ML PO SUSP: 1.0000 g | Freq: Three times a day (TID) | ORAL | 0 refills | Status: DC

## 2016-05-23 MED ORDER — ONDANSETRON 8 MG PO TBDP
8.0000 mg | ORAL_TABLET | Freq: Once | ORAL | Status: AC
  Administered 2016-05-23: 8 mg via ORAL
  Filled 2016-05-23: qty 1

## 2016-05-23 MED ORDER — ONDANSETRON 8 MG PO TBDP: ORAL_TABLET | ORAL | 0 refills | Status: DC

## 2016-05-23 NOTE — ED Notes (Signed)
Pt states that her nausea has resolved at this time. Resting comfortably.

## 2016-05-23 NOTE — ED Notes (Signed)
No vomiting or nausea with PO trial.

## 2016-05-23 NOTE — ED Triage Notes (Signed)
Pt arrives from home via EMS. Pt c/o upper abdominal pain, n/v onset around 2030. Hx of GERD and gastritis. cbg 169, 12 lead WNL, 148/90, hr 90.

## 2016-05-23 NOTE — ED Notes (Signed)
Pt requesting not to have an IV unless unavoidable.

## 2016-05-23 NOTE — ED Notes (Signed)
Pt ambulated to the restroom with a steady gait with standby assistance.

## 2016-05-23 NOTE — ED Notes (Signed)
Pt given water for PO challenge 

## 2016-05-23 NOTE — ED Notes (Signed)
Pt transported to CT ?

## 2016-05-23 NOTE — ED Provider Notes (Signed)
[facility] DEPT Provider Note   CSN: [id number] Arrival date & time: 05/23/16  0108 By signing my name below, I, Dyke Brackett, attest that this documentation has been prepared under the direction and in the presence of Shaeleigh Graw, MD . Electronically Signed: Dyke Brackett, Scribe. 05/23/2016. 3:26 AM.   History   Chief Complaint Chief Complaint  Patient presents with  . Abdominal Pain    HPI Felicia Hall is a 71 y.o. female, brought in by ambulance, who presents to the Emergency Department complaining of gradually improving, intermittent, moderate nausea which began about three hours ago. She notes associated upper abdominal pain which has since resolved, chills, vomiting which began today at midnight. Pt describes her abdominal pain as pressure-like, mild pain which is exacerbated by vomiting. No medications taken PTA; per pt, she denied intervention en route to the ED because she did not want an IV. No alleviating factors noted. No recent sick contact. She denies diarrhea, dysuria, malodorous urine, or hematuria. Pt is afebrile during exam. Pt has no other complaints or symptoms at this time.    The history is provided by the patient. No language interpreter was used.  Emesis   This is a new problem. The current episode started less than 1 hour ago. The problem occurs 2 to 4 times per day. The problem has not changed since onset.The emesis has an appearance of stomach contents. There has been no fever. Associated symptoms include chills and myalgias. Pertinent negatives include no fever and no URI.   Past Medical History:  Diagnosis Date  . Acute upper respiratory infections of unspecified site   . Allergic rhinitis due to pollen   . Benign essential hypertension   . Cervical spondylosis without myelopathy   . Cervicalgia   . Diaphragmatic hernia without mention of obstruction or gangrene   . Disorder of bone and cartilage, unspecified   . Diverticulosis of colon (without  mention of hemorrhage)   . Encounter for long-term (current) use of other medications   . Hyperlipidemia LDL goal < 100   . 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 Other and unspecified hyperlipidemia   . Pain in joint, site unspecified   . Routine general medical examination at a health care facility   . Sjogren's syndrome ([place]) 10/27/2012  . Tear film insufficiency, unspecified   . Unspecified disorder of kidney and ureter   . Unspecified glaucoma(365.9)     Patient Active Problem List   Diagnosis Date Noted  . 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 Allergic rhinitis 10/17/2015  . Chronic pain syndrome 04/30/2013  . Hyperglycemia 04/30/2013  . Low back pain with right-sided sciatica 04/30/2013  . 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 Family history of colonic polyps 04/30/2013  . Sjogren's syndrome ([place]) 10/27/2012  . 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 Encounter for long-term (current) use of other medications   . Disorder of bone and cartilage, unspecified   . Allergic rhinitis due to pollen   . Cervical spondylosis without myelopathy   . 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 Benign essential hypertension   . Hyperlipidemia LDL goal < 100     Past Surgical History:  Procedure Laterality Date  . CATARACT EXTRACTION W/ INTRAOCULAR LENS  IMPLANT, BILATERAL Bilateral 12/2014   Dr. Prudencio Burly  . CHOLECYSTECTOMY  1993   Dr.Lindsey     OB History    No data available      Home Medications  Prior to Admission medications   Medication Sig Start Date End Date Taking? Authorizing Provider  allopurinol (ZYLOPRIM) 100 MG tablet Take one tablet by mouth daily to prevent flares 04/22/16   Tiffany L Reed, DO  aspirin 81 MG tablet Take 81 mg by mouth daily. Take one tablet once daily    Historical Provider, MD  calcium citrate-vitamin D (CITRACAL+D) 315-200 MG-UNIT per tablet Take 1 tablet by mouth 4 (four) times  daily.    Historical Provider, MD  cholecalciferol (VITAMIN D) 1000 UNITS tablet Take 1,000 Units by mouth daily. Take one tablet once daily    Historical Provider, MD  colchicine 0.6 MG tablet Take two tablets by mouth today, then one daily until flare resolved. After Flare is over start Allopuinol 04/22/16   Tiffany L Reed, DO  fenofibrate (TRICOR) 145 MG tablet TAKE 1 TABLET (145 MG TOTAL) BY MOUTH DAILY. 03/23/16   Tiffany L Reed, DO  losartan (COZAAR) 50 MG tablet Take one tablet by mouth once daily for blood pressure 01/27/16   Tiffany L Reed, DO  Multiple Vitamins-Minerals (CENTRUM SILVER PO) Take 0.5 tablets by mouth daily.     Historical Provider, MD  omeprazole (PRILOSEC) 20 MG capsule Take 20 mg by mouth daily. For acid reflux    Historical Provider, MD  Plant Sterols and Stanols (CHOLEST OFF PO) Take 1 tablet by mouth 4 (four) times daily.     Historical Provider, MD  RESTASIS MULTIDOSE 0.05 % ophthalmic emulsion Place 1 drop into both eyes 2 (two) times daily.  01/12/16   Historical Provider, MD  timolol (TIMOPTIC-XR) 0.5 % ophthalmic gel-forming Place 1 drop into both eyes daily.  06/08/12   Historical Provider, MD    Family History Family History  Problem Relation Age of Onset  . Heart attack Mother   . Breast cancer Mother   . Pancreatitis Father   . Heart disease Brother     By-pass surgery  . Lupus Cousin     Social History Social History  Substance Use Topics  . Smoking status: Never Smoker  . Smokeless tobacco: Never Used  . Alcohol use No     Allergies   Statins and Darvocet [propoxyphene n-acetaminophen]   Review of Systems Review of Systems  Constitutional: Positive for chills. Negative for fever.  Gastrointestinal: Positive for nausea and vomiting.  Musculoskeletal: Positive for myalgias.  All other systems reviewed and are negative.  Physical Exam Updated Vital Signs BP 151/81 (BP Location: Left Arm)   Pulse 92   Temp 97.8 F (36.6 C) (Oral)    Resp 18   SpO2 98%   Physical Exam  Constitutional: She is oriented to person, place, and time. She appears well-developed and well-nourished. No distress.  HENT:  Head: Normocephalic and atraumatic. Head is without raccoon's eyes and without Battle's sign.  Right Ear: No hemotympanum.  Left Ear: No hemotympanum.  Mouth/Throat: Oropharynx is clear and moist. No oropharyngeal exudate.  Moist mucous membranes   Eyes: Conjunctivae are normal. Pupils are equal, round, and reactive to light.  Neck: Normal range of motion. Neck supple. No JVD present.  Trachea midline No bruit  Cardiovascular: Normal rate, regular rhythm and normal heart sounds.   Pulmonary/Chest: Effort normal and breath sounds normal. No stridor. No respiratory distress. She has no wheezes. She has no rales.  Abdominal: Soft. She exhibits no distension and no mass. There is no tenderness. There is no guarding.  Hyperactive bowel sounds  Musculoskeletal: Normal range of motion.  Neurological:  She is alert and oriented to person, place, and time. She has normal reflexes. She displays normal reflexes. She exhibits normal muscle tone. Coordination normal.  Skin: Skin is warm and dry. Capillary refill takes less than 2 seconds.  Psychiatric: She has a normal mood and affect. Her behavior is normal.  Nursing note and vitals reviewed.   ED Treatments / Results   Vitals:   05/23/16 0117 05/23/16 0538  BP: 151/81 135/62  Pulse: 92 93  Resp: 18 16  Temp: 97.8 F (36.6 C)     DIAGNOSTIC STUDIES: Oxygen Saturation is 98% on RA, normal by my interpretation.    COORDINATION OF CARE:  3:21 AM Discussed treatment plan with pt at bedside and pt agreed to plan.   Results for orders placed or performed during the hospital encounter of 05/23/16  Lipase, blood  Result Value Ref Range   Lipase 18 11 - 51 U/L  Comprehensive metabolic panel  Result Value Ref Range   Sodium 135 135 - 145 mmol/L   Potassium 3.6 3.5 - 5.1 mmol/L     Chloride 104 101 - 111 mmol/L   CO2 22 22 - 32 mmol/L   Glucose, Bld 169 (H) 65 - 99 mg/dL   BUN 16 6 - 20 mg/dL   Creatinine, Ser 0.97 0.44 - 1.00 mg/dL   Calcium 9.3 8.9 - 10.3 mg/dL   Total Protein 7.6 6.5 - 8.1 g/dL   Albumin 4.5 3.5 - 5.0 g/dL   AST 29 15 - 41 U/L   ALT 26 14 - 54 U/L   Alkaline Phosphatase 48 38 - 126 U/L   Total Bilirubin 0.8 0.3 - 1.2 mg/dL   GFR calc non Af Amer 58 (L) >60 mL/min   GFR calc Af Amer >60 >60 mL/min   Anion gap 9 5 - 15  CBC  Result Value Ref Range   WBC 16.3 (H) 4.0 - 10.5 K/uL   RBC 4.80 3.87 - 5.11 MIL/uL   Hemoglobin 14.6 12.0 - 15.0 g/dL   HCT 43.7 36.0 - 46.0 %   MCV 91.0 78.0 - 100.0 fL   MCH 30.4 26.0 - 34.0 pg   MCHC 33.4 30.0 - 36.0 g/dL   RDW 13.3 11.5 - 15.5 %   Platelets 277 150 - 400 K/uL  Urinalysis, Routine w reflex microscopic  Result Value Ref Range   Color, Urine YELLOW YELLOW   APPearance CLOUDY (A) CLEAR   Specific Gravity, Urine 1.014 1.005 - 1.030   pH 8.0 5.0 - 8.0   Glucose, UA NEGATIVE NEGATIVE mg/dL   Hgb urine dipstick NEGATIVE NEGATIVE   Bilirubin Urine NEGATIVE NEGATIVE   Ketones, ur NEGATIVE NEGATIVE mg/dL   Protein, ur NEGATIVE NEGATIVE mg/dL   Nitrite NEGATIVE NEGATIVE   Leukocytes, UA NEGATIVE NEGATIVE  I-Stat Troponin, ED (not at [facility])  Result Value Ref Range   Troponin i, poc 0.00 0.00 - 0.08 ng/mL   Comment 3           Ct Renal Stone Study  Result Date: 05/23/2016 CLINICAL DATA:  Upper abdominal pain, nausea, and vomiting since around 2030 hours. History of gastroesophageal reflux disease and gastritis. EXAM: CT ABDOMEN AND PELVIS WITHOUT CONTRAST TECHNIQUE: Multidetector CT imaging of the abdomen and pelvis was performed following the standard protocol without IV contrast. COMPARISON:  06/05/2014 FINDINGS: Lower chest: The lung bases are clear. Small esophageal hiatal hernia. Hepatobiliary: Mild diffuse fatty infiltration of the liver. No focal lesions demonstrated. Surgical absence of the  gallbladder. No bile duct dilatation. Pancreas: Unremarkable. No pancreatic ductal dilatation or surrounding inflammatory changes. Spleen: Normal in size without focal abnormality. Adrenals/Urinary Tract: Left adrenal gland nodule measuring 1.7 cm diameter. 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 No change since prior study. Kidneys are symmetrical. No hydronephrosis or hydroureter. No renal, ureteral, or bladder stones. Bladder wall is not thickened and no filling defects are demonstrated. Stomach/Bowel: Stomach, small bowel, and colon are mostly decompressed. No wall thickening or inflammatory changes are appreciated. Appendix is not identified. Vascular/Lymphatic: Aortic atherosclerosis. No enlarged abdominal or pelvic lymph nodes. Reproductive: Surgical clips consistent with tubal ligation. Cyst on the left ovary measuring 3.4 cm, likely functional. Minimal enlargement since previous study. Uterus and right ovary are unremarkable otherwise. Other: No free air or free fluid in the abdomen. Small umbilical hernia containing fat. No change since prior study. Musculoskeletal: Degenerative changes in the spine and hips. No destructive bone lesions. IMPRESSION: No acute process demonstrated in the abdomen or pelvis. No evidence of bowel obstruction or inflammation. Diffuse fatty infiltration of the liver. Benign-appearing left adrenal gland nodule. Small umbilical hernia containing fat. Left ovarian cyst. Electronically Signed   By: Lucienne Capers M.D.   On: 05/23/2016 04:39    EKG  EKG Interpretation  Date/Time:  Sunday May 23 2016 01:14:56 EST Ventricular Rate:  85 PR Interval:    QRS Duration: 78 QT Interval:  354 QTC Calculation: 421 R Axis:   -17 Text Interpretation:  Sinus rhythm Prolonged PR interval Borderline left axis deviation Confirmed by [facility]  MD, Karmelo Bass ([id number]) on 05/23/2016 3:15:39 AM      Procedures Procedures (including critical care time)  Medications  Ordered in ED  Medications  ondansetron (ZOFRAN-ODT) disintegrating tablet 8 mg (8 mg Oral Given 05/23/16 0431)      Final Clinical Impressions(s) / ED Diagnoses  Nausea and vomiting and chills, likely viral.  Refused IV.  All questions answered to patient's satisfaction. Based on history and exam patient has been appropriately medically screened and emergency conditions excluded. Patient is stable for discharge at this time. Strict return precautions given for any further episodes, persistent fever, weakness or any concerns. New Prescriptions New Prescriptions   No medications on file   I personally performed the services described in this documentation, which was scribed in my presence. The recorded information has been reviewed and is accurate.      Veatrice Kells, MD 05/23/16 [phone]

## 2016-05-25 ENCOUNTER — Other Ambulatory Visit: Payer: Medicare Other

## 2016-05-27 ENCOUNTER — Ambulatory Visit: Payer: Medicare Other

## 2016-05-27 ENCOUNTER — Encounter: Payer: Medicare Other | Admitting: Internal Medicine

## 2016-06-04 DIAGNOSIS — M5136 Other intervertebral disc degeneration, lumbar region: Secondary | ICD-10-CM | POA: Diagnosis not present

## 2016-06-04 DIAGNOSIS — M9904 Segmental and somatic dysfunction of sacral region: Secondary | ICD-10-CM | POA: Diagnosis not present

## 2016-06-04 DIAGNOSIS — M9905 Segmental and somatic dysfunction of pelvic region: Secondary | ICD-10-CM | POA: Diagnosis not present

## 2016-06-04 DIAGNOSIS — M9903 Segmental and somatic dysfunction of lumbar region: Secondary | ICD-10-CM | POA: Diagnosis not present

## 2016-06-12 ENCOUNTER — Other Ambulatory Visit: Payer: Self-pay | Admitting: Internal Medicine

## 2016-06-15 DIAGNOSIS — H02831 Dermatochalasis of right upper eyelid: Secondary | ICD-10-CM | POA: Diagnosis not present

## 2016-06-15 DIAGNOSIS — H0279 Other degenerative disorders of eyelid and periocular area: Secondary | ICD-10-CM | POA: Diagnosis not present

## 2016-06-15 DIAGNOSIS — H02413 Mechanical ptosis of bilateral eyelids: Secondary | ICD-10-CM | POA: Diagnosis not present

## 2016-06-15 DIAGNOSIS — H02834 Dermatochalasis of left upper eyelid: Secondary | ICD-10-CM | POA: Diagnosis not present

## 2016-06-15 DIAGNOSIS — H02423 Myogenic ptosis of bilateral eyelids: Secondary | ICD-10-CM | POA: Diagnosis not present

## 2016-06-17 DIAGNOSIS — M5136 Other intervertebral disc degeneration, lumbar region: Secondary | ICD-10-CM | POA: Diagnosis not present

## 2016-06-17 DIAGNOSIS — M9904 Segmental and somatic dysfunction of sacral region: Secondary | ICD-10-CM | POA: Diagnosis not present

## 2016-06-17 DIAGNOSIS — M9905 Segmental and somatic dysfunction of pelvic region: Secondary | ICD-10-CM | POA: Diagnosis not present

## 2016-06-17 DIAGNOSIS — M9903 Segmental and somatic dysfunction of lumbar region: Secondary | ICD-10-CM | POA: Diagnosis not present

## 2016-06-22 DIAGNOSIS — H53489 Generalized contraction of visual field, unspecified eye: Secondary | ICD-10-CM | POA: Insufficient documentation

## 2016-06-22 DIAGNOSIS — H53483 Generalized contraction of visual field, bilateral: Secondary | ICD-10-CM | POA: Diagnosis not present

## 2016-06-29 DIAGNOSIS — M5136 Other intervertebral disc degeneration, lumbar region: Secondary | ICD-10-CM | POA: Diagnosis not present

## 2016-06-29 DIAGNOSIS — M9904 Segmental and somatic dysfunction of sacral region: Secondary | ICD-10-CM | POA: Diagnosis not present

## 2016-06-29 DIAGNOSIS — M9905 Segmental and somatic dysfunction of pelvic region: Secondary | ICD-10-CM | POA: Diagnosis not present

## 2016-06-29 DIAGNOSIS — M9903 Segmental and somatic dysfunction of lumbar region: Secondary | ICD-10-CM | POA: Diagnosis not present

## 2016-07-15 DIAGNOSIS — M9903 Segmental and somatic dysfunction of lumbar region: Secondary | ICD-10-CM | POA: Diagnosis not present

## 2016-07-15 DIAGNOSIS — M9905 Segmental and somatic dysfunction of pelvic region: Secondary | ICD-10-CM | POA: Diagnosis not present

## 2016-07-15 DIAGNOSIS — M9904 Segmental and somatic dysfunction of sacral region: Secondary | ICD-10-CM | POA: Diagnosis not present

## 2016-07-15 DIAGNOSIS — M5136 Other intervertebral disc degeneration, lumbar region: Secondary | ICD-10-CM | POA: Diagnosis not present

## 2016-07-27 DIAGNOSIS — M9904 Segmental and somatic dysfunction of sacral region: Secondary | ICD-10-CM | POA: Diagnosis not present

## 2016-07-27 DIAGNOSIS — M9903 Segmental and somatic dysfunction of lumbar region: Secondary | ICD-10-CM | POA: Diagnosis not present

## 2016-07-27 DIAGNOSIS — M5136 Other intervertebral disc degeneration, lumbar region: Secondary | ICD-10-CM | POA: Diagnosis not present

## 2016-07-27 DIAGNOSIS — M9905 Segmental and somatic dysfunction of pelvic region: Secondary | ICD-10-CM | POA: Diagnosis not present

## 2016-08-05 ENCOUNTER — Other Ambulatory Visit: Payer: Medicare Other

## 2016-08-06 ENCOUNTER — Ambulatory Visit (INDEPENDENT_AMBULATORY_CARE_PROVIDER_SITE_OTHER): Payer: Medicare Other

## 2016-08-06 ENCOUNTER — Other Ambulatory Visit: Payer: Medicare Other

## 2016-08-06 VITALS — BP 124/78 | HR 84 | Temp 97.9°F | Ht 67.0 in | Wt 176.6 lb

## 2016-08-06 DIAGNOSIS — Z0000 Encounter for general adult medical examination without abnormal findings: Secondary | ICD-10-CM

## 2016-08-06 DIAGNOSIS — M9905 Segmental and somatic dysfunction of pelvic region: Secondary | ICD-10-CM | POA: Diagnosis not present

## 2016-08-06 DIAGNOSIS — I10 Essential (primary) hypertension: Secondary | ICD-10-CM

## 2016-08-06 DIAGNOSIS — R739 Hyperglycemia, unspecified: Secondary | ICD-10-CM | POA: Diagnosis not present

## 2016-08-06 DIAGNOSIS — M9904 Segmental and somatic dysfunction of sacral region: Secondary | ICD-10-CM | POA: Diagnosis not present

## 2016-08-06 DIAGNOSIS — E782 Mixed hyperlipidemia: Secondary | ICD-10-CM | POA: Diagnosis not present

## 2016-08-06 DIAGNOSIS — M5136 Other intervertebral disc degeneration, lumbar region: Secondary | ICD-10-CM | POA: Diagnosis not present

## 2016-08-06 DIAGNOSIS — M9903 Segmental and somatic dysfunction of lumbar region: Secondary | ICD-10-CM | POA: Diagnosis not present

## 2016-08-06 LAB — BASIC METABOLIC PANEL
BUN: 15 mg/dL (ref 7–25)
CO2: 27 mmol/L (ref 20–31)
Calcium: 9.7 mg/dL (ref 8.6–10.4)
Chloride: 106 mmol/L (ref 98–110)
Creat: 1.02 mg/dL — ABNORMAL HIGH (ref 0.60–0.93)
Glucose, Bld: 127 mg/dL — ABNORMAL HIGH (ref 65–99)
Potassium: 4.5 mmol/L (ref 3.5–5.3)
Sodium: 140 mmol/L (ref 135–146)

## 2016-08-06 LAB — LIPID PANEL
Cholesterol: 177 mg/dL (ref <200)
HDL: 30 mg/dL — ABNORMAL LOW (ref >50)
LDL Cholesterol: 111 mg/dL — ABNORMAL HIGH (ref <100)
Total CHOL/HDL Ratio: 5.9 Ratio — ABNORMAL HIGH (ref <5.0)
Triglycerides: 178 mg/dL — ABNORMAL HIGH (ref <150)
VLDL: 36 mg/dL — ABNORMAL HIGH (ref <30)

## 2016-08-06 NOTE — Progress Notes (Signed)
   I reviewed health advisor's note, was available for consultation and agree with the assessment and plan as written.  Previously discussed hep c and pt was low risk and did not want to incur costs.  Will attempt tdap at next visit.  Marajade Lei L. Marsi Turvey, D.O. [facility] [address], [place] [address] Cell Phone (Mon-Fri 8am-5pm):  [phone] On Call:  [phone] & follow prompts after 5pm & weekends Office Phone:  [phone] Office Fax:  [phone]   Quick Notes   Health Maintenance: TDAP due. Hep C due.     Abnormal Screen: MMSE 29/30. Passed clock drawing. PHQ-2:3. PHQ-9:12     Patient Concerns: None     Nurse Concerns: None

## 2016-08-06 NOTE — Patient Instructions (Signed)
[name] , Thank you for taking time to come for your Medicare Wellness Visit. I appreciate your ongoing commitment to your health goals. Please review the following plan we discussed and let me know if I can assist you in the future.   Screening recommendations/referrals: Colonoscopy up to date.  Mammogram up to date. Due 01/22/2017 Bone Density up to date Recommended yearly ophthalmology/optometry visit for glaucoma screening and checkup Recommended yearly dental visit for hygiene and checkup  Vaccinations: Influenza vaccine up to date Pneumococcal vaccine up to date. Tdap vaccine due. Shingles vaccine up to date. If you want the new one let us know and we will order it for you.  Advanced directives: please bring [place] in a copy of the one you have at home.  Conditions/risks identified: None  Next appointment: Dr. Mariea Clonts 4/30 @ 9am   Preventive Care 65 Years and Older, Female Preventive care refers to lifestyle choices and visits with your health care provider that can promote health and wellness. What does preventive care include?  A yearly physical exam. This is also called an annual well check.  Dental exams once or twice a year.  Routine eye exams. Ask your health care provider how often you should have your eyes checked.  Personal lifestyle choices, including:  Daily care of your teeth and gums.  Regular physical activity.  Eating a healthy diet.  Avoiding tobacco and drug use.  Limiting alcohol use.  Practicing safe sex.  Taking low-dose aspirin every day.  Taking vitamin and mineral supplements as recommended by your health care provider. What happens during an annual well check? The services and screenings done by your health care provider during your annual well check will depend on your age, overall health, lifestyle risk factors, and family history of disease. Counseling  Your health care provider may ask you questions about your:  Alcohol  use.  Tobacco use.  Drug use.  Emotional well-being.  Home and relationship well-being.  Sexual activity.  Eating habits.  History of falls.  Memory and ability to understand (cognition).  Work and work Statistician.  Reproductive health. Screening  You may have the following tests or measurements:  Height, weight, and BMI.  Blood pressure.  Lipid and cholesterol levels. These may be checked every 5 years, or more frequently if you are over 22 years old.  Skin check.  Lung cancer screening. You may have this screening every year starting at age 85 if you have a 30-pack-year history of smoking and currently smoke or have quit within the past 15 years.  Fecal occult blood test (FOBT) of the stool. You may have this test every year starting at age 71.  Flexible sigmoidoscopy or colonoscopy. You may have a sigmoidoscopy every 5 years or a colonoscopy every 10 years starting at age 37.  Hepatitis C blood test.  Hepatitis B blood test.  Sexually transmitted disease (STD) testing.  Diabetes screening. This is done by checking your blood sugar (glucose) after you have not eaten for a while (fasting). You may have this done every 1-3 years.  Bone density scan. This is done to screen for osteoporosis. You may have this done starting at age 74.  Mammogram. This may be done every 1-2 years. Talk to your health care provider about how often you should have regular mammograms. Talk with your health care provider about your test results, treatment options, and if necessary, the need for more tests. Vaccines  Your health care provider may recommend certain vaccines,  such as:  Influenza vaccine. This is recommended every year.  Tetanus, diphtheria, and acellular pertussis (Tdap, Td) vaccine. You may need a Td booster every 10 years.  Zoster vaccine. You may need this after age 54.  Pneumococcal 13-valent conjugate (PCV13) vaccine. One dose is recommended after age  58.  Pneumococcal polysaccharide (PPSV23) vaccine. One dose is recommended after age 57. Talk to your health care provider about which screenings and vaccines you need and how often you need them. This information is not intended to replace advice given to you by your health care provider. Make sure you discuss any questions you have with your health care provider. Document Released: 04/25/2015 Document Revised: 12/17/2015 Document Reviewed: 01/28/2015 Elsevier Interactive Patient Education  2017 [employer] Prevention in the Home Falls can cause injuries. They can happen to people of all ages. There are many things you can do to make your home safe and to help prevent falls. What can I do on the outside of my home?  Regularly fix the edges of walkways and driveways and fix any cracks.  Remove anything that might make you trip as you walk through a door, such as a raised step or threshold.  Trim any bushes or trees on the path to your home.  Use bright outdoor lighting.  Clear any walking paths of anything that might make someone trip, such as rocks or tools.  Regularly check to see if handrails are loose or broken. Make sure that both sides of any steps have handrails.  Any raised decks and porches should have guardrails on the edges.  Have any leaves, snow, or ice cleared regularly.  Use sand or salt on walking paths during winter.  Clean up any spills in your garage right away. This includes oil or grease spills. What can I do in the bathroom?  Use night lights.  Install grab bars by the toilet and in the tub and shower. Do not use towel bars as grab bars.  Use non-skid mats or decals in the tub or shower.  If you need to sit down in the shower, use a plastic, non-slip stool.  Keep the floor dry. Clean up any water that spills on the floor as soon as it happens.  Remove soap buildup in the tub or shower regularly.  Attach bath mats securely with double-sided  non-slip rug tape.  Do not have throw rugs and other things on the floor that can make you trip. What can I do in the bedroom?  Use night lights.  Make sure that you have a light by your bed that is easy to reach.  Do not use any sheets or blankets that are too big for your bed. They should not hang down onto the floor.  Have a firm chair that has side arms. You can use this for support while you get dressed.  Do not have throw rugs and other things on the floor that can make you trip. What can I do in the kitchen?  Clean up any spills right away.  Avoid walking on wet floors.  Keep items that you use a lot in easy-to-reach places.  If you need to reach something above you, use a strong step stool that has a grab bar.  Keep electrical cords out of the way.  Do not use floor polish or wax that makes floors slippery. If you must use wax, use non-skid floor wax.  Do not have throw rugs and other things on  the floor that can make you trip. What can I do with my stairs?  Do not leave any items on the stairs.  Make sure that there are handrails on both sides of the stairs and use them. Fix handrails that are broken or loose. Make sure that handrails are as long as the stairways.  Check any carpeting to make sure that it is firmly attached to the stairs. Fix any carpet that is loose or worn.  Avoid having throw rugs at the top or bottom of the stairs. If you do have throw rugs, attach them to the floor with carpet tape.  Make sure that you have a light switch at the top of the stairs and the bottom of the stairs. If you do not have them, ask someone to add them for you. What else can I do to help prevent falls?  Wear shoes that:  Do not have high heels.  Have rubber bottoms.  Are comfortable and fit you well.  Are closed at the toe. Do not wear sandals.  If you use a stepladder:  Make sure that it is fully opened. Do not climb a closed stepladder.  Make sure that both  sides of the stepladder are locked into place.  Ask someone to hold it for you, if possible.  Clearly mark and make sure that you can see:  Any grab bars or handrails.  First and last steps.  Where the edge of each step is.  Use tools that help you move around (mobility aids) if they are needed. These include:  Canes.  Walkers.  Scooters.  Crutches.  Turn on the lights when you go into a dark area. Replace any light bulbs as soon as they burn out.  Set up your furniture so you have a clear path. Avoid moving your furniture around.  If any of your floors are uneven, fix them.  If there are any pets around you, be aware of where they are.  Review your medicines with your doctor. Some medicines can make you feel dizzy. This can increase your chance of falling. Ask your doctor what other things that you can do to help prevent falls. This information is not intended to replace advice given to you by your health care provider. Make sure you discuss any questions you have with your health care provider. Document Released: 01/23/2009 Document Revised: 09/04/2015 Document Reviewed: 05/03/2014 Elsevier Interactive Patient Education  2017 [employer].

## 2016-08-06 NOTE — Progress Notes (Signed)
Subjective:   Felicia Hall is a 71 y.o. female who presents for an Initial Medicare Annual Wellness Visit. Cardiac Risk Factors include: dyslipidemia;advanced age (>38men, >52 women);hypertension;family history of premature cardiovascular disease     Objective:    Today's Vitals   08/06/16 0919  BP: 124/78  Pulse: 84  Temp: 97.9 F (36.6 C)  TempSrc: Oral  Weight: 176 lb 9.6 oz (80.1 kg)  Height: 5\' 7"  (1.702 m)  PainSc: 2    Body mass index is 27.66 kg/m.   Current Medications (verified) Outpatient Encounter Prescriptions as of 08/06/2016  Medication Sig  . aspirin 81 MG tablet Take 81 mg by mouth daily. Take one tablet once daily  . calcium citrate-vitamin D (CITRACAL+D) 315-200 MG-UNIT per tablet Take 1 tablet by mouth 4 (four) times daily.  . cholecalciferol (VITAMIN D) 1000 UNITS tablet Take 1,000 Units by mouth daily. Take one tablet once daily  . fenofibrate (TRICOR) 145 MG tablet TAKE 1 TABLET (145 MG TOTAL) BY MOUTH DAILY.  Marland Kitchen latanoprost (XALATAN) 0.005 % ophthalmic solution Place 1 drop into both eyes at bedtime.  Marland Kitchen losartan (COZAAR) 50 MG tablet Take one tablet by mouth once daily for blood pressure  . Multiple Vitamins-Minerals (CENTRUM SILVER PO) Take 0.5 tablets by mouth daily.   Marland Kitchen omeprazole (PRILOSEC) 20 MG capsule Take 20 mg by mouth daily. For acid reflux  . Plant Sterols and Stanols (CHOLEST OFF PO) Take 1 tablet by mouth 4 (four) times daily.   . RESTASIS MULTIDOSE 0.05 % ophthalmic emulsion Place 1 drop into both eyes 2 (two) times daily.   . sucralfate (CARAFATE) 1 GM/10ML suspension Take 10 mLs (1 g total) by mouth 4 (four) times daily -  with meals and at bedtime.  . timolol (TIMOPTIC-XR) 0.5 % ophthalmic gel-forming Place 1 drop into both eyes daily.   . ondansetron (ZOFRAN ODT) 8 MG disintegrating tablet 8mg  ODT q8 hours prn nausea (Patient not taking: Reported on 08/06/2016)  . [DISCONTINUED] allopurinol (ZYLOPRIM) 100 MG tablet Take one tablet by  mouth daily to prevent flares  . [DISCONTINUED] colchicine 0.6 MG tablet Take two tablets by mouth today, then one daily until flare resolved. After Flare is over start Allopuinol   No facility-administered encounter medications on file as of 08/06/2016.     Allergies (verified) Statins and Darvocet [propoxyphene n-acetaminophen]   History: Past Medical History:  Diagnosis Date  . Acute upper respiratory infections of unspecified site   . Allergic rhinitis due to pollen   . Benign essential hypertension   . Cervical spondylosis without myelopathy   . Cervicalgia   . Diaphragmatic hernia without mention of obstruction or gangrene   . Disorder of bone and cartilage, unspecified   . Diverticulosis of colon (without mention of hemorrhage)   . Encounter for long-term (current) use of other medications   . Hyperlipidemia LDL goal < 100   . 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 Other and unspecified hyperlipidemia   . Pain in joint, site unspecified   . Routine general medical examination at a health care facility   . Sjogren's syndrome ([place]) 10/27/2012  . Tear film insufficiency, unspecified   . Unspecified disorder of kidney and ureter   . Unspecified glaucoma(365.9)    Past Surgical History:  Procedure Laterality Date  . CATARACT EXTRACTION W/ INTRAOCULAR LENS  IMPLANT, BILATERAL Bilateral 12/2014   Dr. Prudencio Burly  . [place]    Family History  Problem  Relation Age of Onset  . Heart attack Mother   . Breast cancer Mother   . Pancreatitis Father   . Heart disease Brother     By-pass surgery  . Lupus Cousin    Social History   Occupational History  . Legal assistant    Social History Main Topics  . Smoking status: Never Smoker  . Smokeless tobacco: Never Used  . Alcohol use No  . Drug use: No  . Sexual activity: Not on file    Tobacco Counseling Counseling given: Not Answered   Activities of Daily Living In your present  state of health, do you have any difficulty performing the following activities: 08/06/2016  Hearing? N  Vision? Y  Difficulty concentrating or making decisions? N  Walking or climbing stairs? Y  Dressing or bathing? N  Doing errands, shopping? N  Preparing Food and eating ? N  Using the Toilet? N  In the past six months, have you accidently leaked urine? Y  Do you have problems with loss of bowel control? N  Managing your Medications? N  Managing your Finances? N  Housekeeping or managing your Housekeeping? N  Some recent data might be hidden    Immunizations and Health Maintenance Immunization History  Administered Date(s) Administered  . Influenza,inj,Quad PF,36+ Mos 12/27/2014, 01/15/2016  . Influenza-Unspecified 01/10/2008, 02/15/2011, 01/20/2012  . Pneumococcal Conjugate-13 11/01/2013  . Pneumococcal Polysaccharide-23 09/14/2010  . Tdap 04/12/2005  . Zoster 04/12/2005   Health Maintenance Due  Topic Date Due  . Hepatitis C Screening  1945/11/24  . TETANUS/TDAP  04/13/2015  . PNA vac Low Risk Adult (2 of 2 - PPSV23) 09/14/2015    Patient Care Team: Gayland Curry, DO as PCP - General (Geriatric Medicine)  Indicate any recent Medical Services you may have received from other than [facility] providers in the past year (date may be approximate).     Assessment:   This is a routine wellness examination for Felicia Hall.   Hearing/Vision screen No exam data present  Dietary issues and exercise activities discussed: Current Exercise Habits: Home exercise routine, Type of exercise: strength training/weights;Other - see comments (stationary bike), Time (Minutes): 30, Frequency (Times/Week): 7, Weekly Exercise (Minutes/Week): 210, Intensity: Moderate, Exercise limited by: None identified  Goals    . Weight (lb) < 160 lb (72.6 kg)          Starting 08/07/2016 I would like to lose weight by snacking less.      Depression Screen PHQ 2/9 Scores 08/06/2016 09/12/2015 05/09/2015 05/27/2014  01/31/2014 11/01/2013 07/20/2012  PHQ - 2 Score 3 0 0 0 2 0 1  PHQ- 9 Score 12 - - - 4 - -    Fall Risk Fall Risk  08/06/2016 09/12/2015 05/11/2015 05/09/2015 09/23/2014  Falls in the past year? Yes No Yes No No  Number falls in past yr: 1 - 1 - -  Injury with Fall? No - No - -  Risk for fall due to : - - History of fall(s) - -  Follow up - - Falls evaluation completed;Falls prevention discussed;Education provided - -    Cognitive Function: MMSE - Mini Mental State Exam 08/06/2016 05/09/2015  Orientation to time 5 5  Orientation to Place 5 5  Registration 3 3  Attention/ Calculation 5 5  Recall 2 3  Language- name 2 objects 2 2  Language- repeat 1 1  Language- follow 3 step command 3 3  Language- read & follow direction 1 1  Write a sentence  1 1  Copy design 1 1  Total score 29 30        Screening Tests Health Maintenance  Topic Date Due  . Hepatitis C Screening  [birth year]  . TETANUS/TDAP  04/13/2015  . PNA vac Low Risk Adult (2 of 2 - PPSV23) 09/14/2015  . INFLUENZA VACCINE  11/10/2016  . MAMMOGRAM  01/22/2017  . COLONOSCOPY  12/07/2023  . DEXA SCAN  Completed      Plan:  I have personally reviewed and addressed the Medicare Annual Wellness questionnaire and have noted the following in the patient's chart:  A. Medical and social history B. Use of alcohol, tobacco or illicit drugs  C. Current medications and supplements D. Functional ability and status E.  Nutritional status F.  Physical activity G. Advance directives H. List of other physicians I.  Hospitalizations, surgeries, and ER visits in previous 12 months J.  [place] to include hearing, vision, cognitive, depression L. Referrals and appointments - none  In addition, I have reviewed and discussed with patient certain preventive protocols, quality metrics, and best practice recommendations. A written personalized care plan for preventive services as well as general preventive health recommendations  were provided to patient.  See attached scanned questionnaire for additional information.   Signed,   Rich Reining, RN Nurse Health Advisor

## 2016-08-07 LAB — HEMOGLOBIN A1C
Hgb A1c MFr Bld: 6.3 % — ABNORMAL HIGH (ref <5.7)
Mean Plasma Glucose: 134 mg/dL

## 2016-08-09 ENCOUNTER — Ambulatory Visit (INDEPENDENT_AMBULATORY_CARE_PROVIDER_SITE_OTHER): Payer: Medicare Other | Admitting: Internal Medicine

## 2016-08-09 ENCOUNTER — Encounter: Payer: Self-pay | Admitting: Internal Medicine

## 2016-08-09 VITALS — BP 132/88 | HR 84 | Temp 98.2°F | Ht 67.0 in | Wt 175.0 lb

## 2016-08-09 DIAGNOSIS — M4802 Spinal stenosis, cervical region: Secondary | ICD-10-CM | POA: Diagnosis not present

## 2016-08-09 DIAGNOSIS — Z0000 Encounter for general adult medical examination without abnormal findings: Secondary | ICD-10-CM

## 2016-08-09 DIAGNOSIS — K581 Irritable bowel syndrome with constipation: Secondary | ICD-10-CM | POA: Diagnosis not present

## 2016-08-09 DIAGNOSIS — F321 Major depressive disorder, single episode, moderate: Secondary | ICD-10-CM

## 2016-08-09 DIAGNOSIS — M3501 Sicca syndrome with keratoconjunctivitis: Secondary | ICD-10-CM

## 2016-08-09 DIAGNOSIS — E782 Mixed hyperlipidemia: Secondary | ICD-10-CM | POA: Diagnosis not present

## 2016-08-09 DIAGNOSIS — M542 Cervicalgia: Secondary | ICD-10-CM

## 2016-08-09 DIAGNOSIS — G8929 Other chronic pain: Secondary | ICD-10-CM | POA: Insufficient documentation

## 2016-08-09 DIAGNOSIS — R739 Hyperglycemia, unspecified: Secondary | ICD-10-CM

## 2016-08-09 DIAGNOSIS — I10 Essential (primary) hypertension: Secondary | ICD-10-CM

## 2016-08-09 NOTE — Progress Notes (Signed)
Provider:  Rexene Edison. Mariea Clonts, D.O., C.M.D. Location:   [facility]   Place of Service:   clinic  Previous PCP: Hollace Kinnier, DO Patient Care Team: Gayland Curry, DO as PCP - General (Geriatric Medicine)  Extended Emergency Contact Information Primary Emergency Contact: Heino,Thomas Address: [place], [place] Phone: [phone] Relation: Brother  Code Status: DNR Goals of Care: Advanced Directive information Advanced Directives 08/06/2016  Does Patient Have a Medical Advance Directive? Yes  Type of Paramedic of [place];Living will  Does patient want to make changes to medical advance directive? No - Patient declined  Copy of [facility] in Chart? No - copy requested  Would patient like information on creating a medical advance directive? -   Chief Complaint  Patient presents with  . Annual Exam    CPE    HPI: Patient is a 71 y.o. Hall seen today for an annual physical exam.    Scored 12 on PHQ-9.  Doesn't like being old or living in the 21st century.  Says there's nothing she can do about those two things.  Has not been to a counselor and refuses to go.  Also refuses any medication.  No suicidal ideation.    Tdap and hep c due, but she refused the hep c due to low risk and cost.    Reviewed elevated cholesterol with her.  We had previously tried to get her qualified for an injectable w/o success.    Also hba1c staying stable. 6.3  Switched to [facility] for her mammogram where she gets her pap/pelvic exam.  Had normal mammo on 04/16/16.    Possible gout in right toe.  Says she was allergic to it.  Got red, itchy rash within a week and it went away.  No further episodes of redness/warmth/swelling of toe.    Refuses tdap for now due to painful nature of vaccine.    Had stomach bug in feb.  Cannot drive at night.  Has to go by ambulance.  Reports they told her she had "heart  stoppage", but review of EKG and ED notes indicates sinus rhythm with slightly prolonged P-R interval but otherwise unremarkable.   Past Medical History:  Diagnosis Date  . Acute upper respiratory infections of unspecified site   . Allergic rhinitis due to pollen   . Benign essential hypertension   . Cervical spondylosis without myelopathy   . Cervicalgia   . Diaphragmatic hernia without mention of obstruction or gangrene   . Disorder of bone and cartilage, unspecified   . Diverticulosis of colon (without mention of hemorrhage)   . Encounter for long-term (current) use of other medications   . Hyperlipidemia LDL goal < 100   . 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 Other and unspecified hyperlipidemia   . Pain in joint, site unspecified   . Routine general medical examination at a health care facility   . Sjogren's syndrome ([place]) 10/27/2012  . Tear film insufficiency, unspecified   . Unspecified disorder of kidney and ureter   . Unspecified glaucoma(365.9)    Past Surgical History:  Procedure Laterality Date  . CATARACT EXTRACTION W/ INTRAOCULAR LENS  IMPLANT, BILATERAL Bilateral 12/2014   Dr. Prudencio Burly  . CHOLECYSTECTOMY  1993   Dr.Lindsey     reports that she has never smoked. She has never used smokeless tobacco. She reports that she does not  drink alcohol or use drugs.  Functional Status Survey:    Family History  Problem Relation Age of Onset  . Heart attack Mother   . Breast cancer Mother   . Pancreatitis Father   . Heart disease Brother     By-pass surgery  . Lupus Cousin     Health Maintenance  Topic Date Due  . Hepatitis C Screening  [birth year]  . TETANUS/TDAP  04/13/2015  . PNA vac Low Risk Adult (2 of 2 - PPSV23) 09/14/2015  . INFLUENZA VACCINE  11/10/2016  . MAMMOGRAM  01/22/2017  . COLONOSCOPY  12/07/2023  . DEXA SCAN  Completed    Allergies  Allergen Reactions  . Statins Other (See Comments)    Muscle problems  . Darvocet  [Propoxyphene N-Acetaminophen] Rash    Allergies as of 08/09/2016      Reactions   Statins Other (See Comments)   Muscle problems   Darvocet [propoxyphene N-acetaminophen] Rash      Medication List       Accurate as of 08/09/16  9:08 AM. Always use your most recent med list.          aspirin 81 MG tablet Take 81 mg by mouth daily. Take one tablet once daily   calcium citrate-vitamin D 315-200 MG-UNIT tablet Commonly known as:  CITRACAL+D Take 1 tablet by mouth 4 (four) times daily.   CENTRUM SILVER PO Take 0.5 tablets by mouth daily.   cholecalciferol 1000 units tablet Commonly known as:  VITAMIN D Take 1,000 Units by mouth daily. Take one tablet once daily   CHOLEST OFF PO Take 1 tablet by mouth 4 (four) times daily.   fenofibrate 145 MG tablet Commonly known as:  TRICOR TAKE 1 TABLET (145 MG TOTAL) BY MOUTH DAILY.   latanoprost 0.005 % ophthalmic solution Commonly known as:  XALATAN Place 1 drop into both eyes at bedtime.   losartan 50 MG tablet Commonly known as:  COZAAR Take one tablet by mouth once daily for blood pressure   omeprazole 20 MG capsule Commonly known as:  PRILOSEC Take 20 mg by mouth daily. For acid reflux   RESTASIS MULTIDOSE 0.05 % ophthalmic emulsion Generic drug:  cycloSPORINE Place 1 drop into both eyes 2 (two) times daily.       Review of Systems  Constitutional: Positive for malaise/fatigue. Negative for chills, fever and weight loss.  HENT: Positive for congestion and sinus pain. Negative for hearing loss and sore throat.        Dry mouth, one bad tooth, some dysphagia related to dry mouth with dry foods  Eyes: Negative for blurred vision.       Dry eyes; glaucoma, eye drops were changed and follows up this week, doing better   Respiratory: Negative for cough and shortness of breath.   Cardiovascular: Negative for chest pain, palpitations and leg swelling.  Gastrointestinal: Negative for abdominal pain, blood in stool,  constipation, diarrhea and melena.  Genitourinary: Negative for dysuria.  Musculoskeletal: Positive for back pain, joint pain and myalgias. Negative for falls and neck pain.  Skin: Negative for itching and rash.       Did have rash with allopurinol--added to allergies  Neurological: Negative for dizziness, loss of consciousness and weakness.  Endo/Heme/Allergies: Does not bruise/bleed easily.  Psychiatric/Behavioral: Positive for depression. Negative for hallucinations, memory loss, substance abuse and suicidal ideas. The patient is not nervous/anxious and does not have insomnia.     Vitals:   08/09/16 0859  BP: 140/90  Pulse: 84  Temp: 98.2 F (36.8 C)  TempSrc: Oral  SpO2: 96%  Weight: 175 lb (79.4 kg)  Height: 5\' 7"  (1.702 m)   Body mass index is 27.41 kg/m. Physical Exam  Constitutional: She is oriented to person, place, and time. She appears well-developed and well-nourished. No distress.  HENT:  Head: Normocephalic and atraumatic.  Right Ear: External ear normal.  Left Ear: External ear normal.  Nose: Nose normal.  Mouth/Throat: Oropharynx is clear and moist. No oropharyngeal exudate.  Eyes: Conjunctivae and EOM are normal. Pupils are equal, round, and reactive to light.  Neck: Normal range of motion. Neck supple. No JVD present.  Cardiovascular: Normal rate, regular rhythm, normal heart sounds and intact distal pulses.   Midsystolic click  Pulmonary/Chest: Effort normal and breath sounds normal. No respiratory distress.  Abdominal: Soft. Bowel sounds are normal. She exhibits no distension. There is no tenderness.  Genitourinary:  Genitourinary Comments: Sees gyn for breast exam and pelvic  Musculoskeletal: Normal range of motion. She exhibits tenderness.  Right hip and lumbar spine  Lymphadenopathy:    She has no cervical adenopathy.  Neurological: She is alert and oriented to person, place, and time. She displays normal reflexes. No cranial nerve deficit.  Coordination normal.  Skin: Skin is warm and dry. Capillary refill takes less than 2 seconds. There is pallor.  Psychiatric: Her behavior is normal. Judgment and thought content normal.  Flat affect    Labs reviewed: Basic Metabolic Panel:  Recent Labs  01/12/16 1017 05/23/16 0118 08/06/16 0825  NA 139 135 140  K 4.7 3.6 4.5  CL 103 104 106  CO2 26 22 27   GLUCOSE 105* 169* 127*  BUN 12 16 15   CREATININE 0.86 0.97 1.02*  CALCIUM 9.6 9.3 9.7   Liver Function Tests:  Recent Labs  09/10/15 0848 05/23/16 0118  AST 23 29  ALT 25 26  ALKPHOS 53 48  BILITOT 0.4 0.8  PROT 6.9 7.6  ALBUMIN 4.4 4.5    Recent Labs  05/23/16 0118  LIPASE 18   No results for input(s): AMMONIA in the last 8760 hours. CBC:  Recent Labs  05/23/16 0118  WBC 16.3*  HGB 14.6  HCT 43.7  MCV 91.0  PLT 277   Cardiac Enzymes: No results for input(s): CKTOTAL, CKMB, CKMBINDEX, TROPONINI in the last 8760 hours. BNP: Invalid input(s): POCBNP Lab Results  Component Value Date   HGBA1C 6.3 (H) 08/06/2016   No results found for: TSH No results found for: VITAMINB12 No results found for: FOLATE No results found for: IRON, TIBC, FERRITIN  Imaging and Procedures Recently: mammo normal in Jan  Assessment/Plan 1. Annual physical exam -performed today, gets gyn exam and breast exam at [facility]  2. Benign essential hypertension -bp better on recheck, cont current therapy, no changes  3. Mixed hyperlipidemia -refuses statin therapy, cont fenofibrate and her cholestoff, could not get her injectable when we tried in the past -has difficulty getting this down due to love of starchy snacks  4. Sjogren's syndrome with keratoconjunctivitis sicca (HCC) -ongoing, eyes doing a bit better with latanoprost for her glaucoma instead of timolol  5. Hyperglycemia -cont to work on diet, exercise, monitor   6. Spinal stenosis in cervical region -pain in lower back a bit worse since her  fall/syncopal episode I'd seen her for -uses some tylenol, stretches, does not tolerate meds well  7. Irritable bowel syndrome with constipation -does not know what she's going to get each day, but  not having diarrhea, only more loose stool at times and constipation other times  8. Depression, major, single episode, moderate (HCC) -no SI -refuses medications and counseling  Labs/tests ordered:   Orders Placed This Encounter  Procedures  . [facility] MAMMOGRAPHY    This external order was created through the Results Console.    Azha Constantin L. Jerold Yoss, D.O. [facility] [address], [place] [address] Cell Phone (Mon-Fri 8am-5pm):  [phone] On Call:  [phone] & follow prompts after 5pm & weekends Office Phone:  [phone] Office Fax:  [phone]

## 2016-08-12 DIAGNOSIS — H401113 Primary open-angle glaucoma, right eye, severe stage: Secondary | ICD-10-CM | POA: Diagnosis not present

## 2016-08-19 DIAGNOSIS — M5136 Other intervertebral disc degeneration, lumbar region: Secondary | ICD-10-CM | POA: Diagnosis not present

## 2016-08-19 DIAGNOSIS — M9904 Segmental and somatic dysfunction of sacral region: Secondary | ICD-10-CM | POA: Diagnosis not present

## 2016-08-19 DIAGNOSIS — M9905 Segmental and somatic dysfunction of pelvic region: Secondary | ICD-10-CM | POA: Diagnosis not present

## 2016-08-19 DIAGNOSIS — M9903 Segmental and somatic dysfunction of lumbar region: Secondary | ICD-10-CM | POA: Diagnosis not present

## 2016-09-02 DIAGNOSIS — M9904 Segmental and somatic dysfunction of sacral region: Secondary | ICD-10-CM | POA: Diagnosis not present

## 2016-09-02 DIAGNOSIS — M9905 Segmental and somatic dysfunction of pelvic region: Secondary | ICD-10-CM | POA: Diagnosis not present

## 2016-09-02 DIAGNOSIS — M5136 Other intervertebral disc degeneration, lumbar region: Secondary | ICD-10-CM | POA: Diagnosis not present

## 2016-09-02 DIAGNOSIS — M9903 Segmental and somatic dysfunction of lumbar region: Secondary | ICD-10-CM | POA: Diagnosis not present

## 2016-09-09 DIAGNOSIS — M9905 Segmental and somatic dysfunction of pelvic region: Secondary | ICD-10-CM | POA: Diagnosis not present

## 2016-09-09 DIAGNOSIS — M9904 Segmental and somatic dysfunction of sacral region: Secondary | ICD-10-CM | POA: Diagnosis not present

## 2016-09-09 DIAGNOSIS — M9903 Segmental and somatic dysfunction of lumbar region: Secondary | ICD-10-CM | POA: Diagnosis not present

## 2016-09-09 DIAGNOSIS — M5136 Other intervertebral disc degeneration, lumbar region: Secondary | ICD-10-CM | POA: Diagnosis not present

## 2016-09-14 ENCOUNTER — Encounter: Payer: Self-pay | Admitting: Internal Medicine

## 2016-09-14 DIAGNOSIS — H02423 Myogenic ptosis of bilateral eyelids: Secondary | ICD-10-CM | POA: Diagnosis not present

## 2016-09-14 DIAGNOSIS — H02413 Mechanical ptosis of bilateral eyelids: Secondary | ICD-10-CM | POA: Diagnosis not present

## 2016-09-19 ENCOUNTER — Other Ambulatory Visit: Payer: Self-pay | Admitting: Internal Medicine

## 2016-09-22 DIAGNOSIS — M9903 Segmental and somatic dysfunction of lumbar region: Secondary | ICD-10-CM | POA: Diagnosis not present

## 2016-09-22 DIAGNOSIS — M5136 Other intervertebral disc degeneration, lumbar region: Secondary | ICD-10-CM | POA: Diagnosis not present

## 2016-09-22 DIAGNOSIS — M9905 Segmental and somatic dysfunction of pelvic region: Secondary | ICD-10-CM | POA: Diagnosis not present

## 2016-09-22 DIAGNOSIS — M9904 Segmental and somatic dysfunction of sacral region: Secondary | ICD-10-CM | POA: Diagnosis not present

## 2016-10-05 DIAGNOSIS — M9903 Segmental and somatic dysfunction of lumbar region: Secondary | ICD-10-CM | POA: Diagnosis not present

## 2016-10-05 DIAGNOSIS — M5136 Other intervertebral disc degeneration, lumbar region: Secondary | ICD-10-CM | POA: Diagnosis not present

## 2016-10-05 DIAGNOSIS — M9904 Segmental and somatic dysfunction of sacral region: Secondary | ICD-10-CM | POA: Diagnosis not present

## 2016-10-05 DIAGNOSIS — M9905 Segmental and somatic dysfunction of pelvic region: Secondary | ICD-10-CM | POA: Diagnosis not present

## 2016-10-11 DIAGNOSIS — M9902 Segmental and somatic dysfunction of thoracic region: Secondary | ICD-10-CM | POA: Diagnosis not present

## 2016-10-11 DIAGNOSIS — M5134 Other intervertebral disc degeneration, thoracic region: Secondary | ICD-10-CM | POA: Diagnosis not present

## 2016-10-18 DIAGNOSIS — H04123 Dry eye syndrome of bilateral lacrimal glands: Secondary | ICD-10-CM | POA: Diagnosis not present

## 2016-10-18 DIAGNOSIS — H401113 Primary open-angle glaucoma, right eye, severe stage: Secondary | ICD-10-CM | POA: Diagnosis not present

## 2016-10-19 DIAGNOSIS — M5134 Other intervertebral disc degeneration, thoracic region: Secondary | ICD-10-CM | POA: Diagnosis not present

## 2016-10-19 DIAGNOSIS — M9902 Segmental and somatic dysfunction of thoracic region: Secondary | ICD-10-CM | POA: Diagnosis not present

## 2016-10-26 DIAGNOSIS — M9902 Segmental and somatic dysfunction of thoracic region: Secondary | ICD-10-CM | POA: Diagnosis not present

## 2016-10-26 DIAGNOSIS — M5134 Other intervertebral disc degeneration, thoracic region: Secondary | ICD-10-CM | POA: Diagnosis not present

## 2016-10-29 DIAGNOSIS — M9902 Segmental and somatic dysfunction of thoracic region: Secondary | ICD-10-CM | POA: Diagnosis not present

## 2016-10-29 DIAGNOSIS — M5134 Other intervertebral disc degeneration, thoracic region: Secondary | ICD-10-CM | POA: Diagnosis not present

## 2016-11-11 DIAGNOSIS — M9903 Segmental and somatic dysfunction of lumbar region: Secondary | ICD-10-CM | POA: Diagnosis not present

## 2016-11-11 DIAGNOSIS — M9904 Segmental and somatic dysfunction of sacral region: Secondary | ICD-10-CM | POA: Diagnosis not present

## 2016-11-11 DIAGNOSIS — M9905 Segmental and somatic dysfunction of pelvic region: Secondary | ICD-10-CM | POA: Diagnosis not present

## 2016-11-11 DIAGNOSIS — M5136 Other intervertebral disc degeneration, lumbar region: Secondary | ICD-10-CM | POA: Diagnosis not present

## 2016-11-15 DIAGNOSIS — H04123 Dry eye syndrome of bilateral lacrimal glands: Secondary | ICD-10-CM | POA: Diagnosis not present

## 2016-11-24 DIAGNOSIS — H04122 Dry eye syndrome of left lacrimal gland: Secondary | ICD-10-CM | POA: Diagnosis not present

## 2016-11-24 DIAGNOSIS — H401113 Primary open-angle glaucoma, right eye, severe stage: Secondary | ICD-10-CM | POA: Diagnosis not present

## 2016-11-24 DIAGNOSIS — H04121 Dry eye syndrome of right lacrimal gland: Secondary | ICD-10-CM | POA: Diagnosis not present

## 2016-11-25 DIAGNOSIS — M9903 Segmental and somatic dysfunction of lumbar region: Secondary | ICD-10-CM | POA: Diagnosis not present

## 2016-11-25 DIAGNOSIS — M5136 Other intervertebral disc degeneration, lumbar region: Secondary | ICD-10-CM | POA: Diagnosis not present

## 2016-11-25 DIAGNOSIS — M9905 Segmental and somatic dysfunction of pelvic region: Secondary | ICD-10-CM | POA: Diagnosis not present

## 2016-11-25 DIAGNOSIS — M9904 Segmental and somatic dysfunction of sacral region: Secondary | ICD-10-CM | POA: Diagnosis not present

## 2016-12-03 ENCOUNTER — Ambulatory Visit: Payer: Medicare Other | Admitting: Internal Medicine

## 2016-12-08 DIAGNOSIS — M9903 Segmental and somatic dysfunction of lumbar region: Secondary | ICD-10-CM | POA: Diagnosis not present

## 2016-12-08 DIAGNOSIS — M9902 Segmental and somatic dysfunction of thoracic region: Secondary | ICD-10-CM | POA: Diagnosis not present

## 2016-12-08 DIAGNOSIS — M9905 Segmental and somatic dysfunction of pelvic region: Secondary | ICD-10-CM | POA: Diagnosis not present

## 2016-12-08 DIAGNOSIS — M9904 Segmental and somatic dysfunction of sacral region: Secondary | ICD-10-CM | POA: Diagnosis not present

## 2016-12-08 DIAGNOSIS — M5136 Other intervertebral disc degeneration, lumbar region: Secondary | ICD-10-CM | POA: Diagnosis not present

## 2016-12-08 DIAGNOSIS — M5134 Other intervertebral disc degeneration, thoracic region: Secondary | ICD-10-CM | POA: Diagnosis not present

## 2016-12-22 ENCOUNTER — Other Ambulatory Visit: Payer: Self-pay | Admitting: Internal Medicine

## 2016-12-22 DIAGNOSIS — M5136 Other intervertebral disc degeneration, lumbar region: Secondary | ICD-10-CM | POA: Diagnosis not present

## 2016-12-22 DIAGNOSIS — M9905 Segmental and somatic dysfunction of pelvic region: Secondary | ICD-10-CM | POA: Diagnosis not present

## 2016-12-22 DIAGNOSIS — M9904 Segmental and somatic dysfunction of sacral region: Secondary | ICD-10-CM | POA: Diagnosis not present

## 2016-12-22 DIAGNOSIS — M9903 Segmental and somatic dysfunction of lumbar region: Secondary | ICD-10-CM | POA: Diagnosis not present

## 2017-01-03 ENCOUNTER — Ambulatory Visit (INDEPENDENT_AMBULATORY_CARE_PROVIDER_SITE_OTHER): Payer: Medicare Other | Admitting: Internal Medicine

## 2017-01-03 ENCOUNTER — Encounter: Payer: Self-pay | Admitting: Internal Medicine

## 2017-01-03 VITALS — BP 118/70 | HR 84 | Temp 98.6°F | Wt 176.0 lb

## 2017-01-03 DIAGNOSIS — G250 Essential tremor: Secondary | ICD-10-CM | POA: Insufficient documentation

## 2017-01-03 DIAGNOSIS — Z23 Encounter for immunization: Secondary | ICD-10-CM | POA: Diagnosis not present

## 2017-01-03 DIAGNOSIS — G8929 Other chronic pain: Secondary | ICD-10-CM | POA: Diagnosis not present

## 2017-01-03 DIAGNOSIS — E782 Mixed hyperlipidemia: Secondary | ICD-10-CM

## 2017-01-03 DIAGNOSIS — M5441 Lumbago with sciatica, right side: Secondary | ICD-10-CM | POA: Diagnosis not present

## 2017-01-03 DIAGNOSIS — M3501 Sicca syndrome with keratoconjunctivitis: Secondary | ICD-10-CM | POA: Diagnosis not present

## 2017-01-03 DIAGNOSIS — I10 Essential (primary) hypertension: Secondary | ICD-10-CM

## 2017-01-03 DIAGNOSIS — M4802 Spinal stenosis, cervical region: Secondary | ICD-10-CM | POA: Diagnosis not present

## 2017-01-03 DIAGNOSIS — G729 Myopathy, unspecified: Secondary | ICD-10-CM

## 2017-01-03 NOTE — Patient Instructions (Signed)
We'll rule out a side effect of your fenofibrate first, but if we don't find anything wrong, then we will check a MRI of your lumbar spine.

## 2017-01-03 NOTE — Progress Notes (Signed)
Location:  [facility] clinic Provider:  Naomi Castrogiovanni L. Mariea Clonts, D.O., C.M.D.  Code Status: DNR Goals of Care:  Advanced Directives 08/06/2016  Does Patient Have a Medical Advance Directive? Yes  Type of Paramedic of [place];Living will  Does patient want to make changes to medical advance directive? No - Patient declined  Copy of [place] in Chart? No - copy requested  Would patient like information on creating a medical advance directive? -   Chief Complaint  Patient presents with  . Medical Management of Chronic Issues    67mth follow-up    HPI: Patient is a 71 y.o. female seen today for medical management of chronic diseases.    Bilateral hips and legs are getting weaker and she's having more pain and cramps in her right leg in the path where she had sciatica.  Has had her right leg turn in and toes cramp up.  Happens when she turns in bed first thing in the am.  Pain is straight across sacroiliac region.  She thinks this could be due to fenofibrate.  Had her eyelid surgery.  Left did not come up enough.  Needs a repeat procedure.    Hands have been shaking for the past year when she holds something.  Notices the right more, but she is righthanded.  Only lightweight items like a piece of paper but heavy items.   Her brother has been having the same problem.  Not sure anyone else had it in the family.  Neck is about the same.  Still sees Dr. Teryl Lucy which helps her a lot.    Vision ok.  Glaucoma stable.  Saw Dr. Prudencio Burly last month.  Had a laser procedure to lower the pressure to get her off the drops since they worsen her dry eyes.  Goes back this week to have a pressure recheck.  If it's dropped significantly, she can stop the drops and reassess.  If it worked, she'll get the left eye done next.    Had been on plaquenil at one point for the sjogren's and arthritis, but taken off due to her eye problems.  Not on anything for a while and stopped seeing  rheum.  Past Medical History:  Diagnosis Date  . Acute upper respiratory infections of unspecified site   . Allergic rhinitis due to pollen   . Benign essential hypertension   . Cervical spondylosis without myelopathy   . Cervicalgia   . Diaphragmatic hernia without mention of obstruction or gangrene   . Disorder of bone and cartilage, unspecified   . Diverticulosis of colon (without mention of hemorrhage)   . Encounter for long-term (current) use of other medications   . Hyperlipidemia LDL goal < 100   . 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 Other and unspecified hyperlipidemia   . Pain in joint, site unspecified   . Routine general medical examination at a health care facility   . Sjogren's syndrome ([place]) 10/27/2012  . Tear film insufficiency, unspecified   . Unspecified disorder of kidney and ureter   . Unspecified glaucoma(365.9)     Past Surgical History:  Procedure Laterality Date  . CATARACT EXTRACTION W/ INTRAOCULAR LENS  IMPLANT, BILATERAL Bilateral 12/2014   Dr. Prudencio Burly  . CHOLECYSTECTOMY  1993   Dr.Lindsey     Allergies  Allergen Reactions  . Statins Other (See Comments)    Muscle problems  . Allopurinol Rash  . Darvocet [Propoxyphene N-Acetaminophen] Rash  Outpatient Encounter Prescriptions as of 01/03/2017  Medication Sig  . aspirin 81 MG tablet Take 81 mg by mouth daily. Take one tablet once daily  . calcium citrate-vitamin D (CITRACAL+D) 315-200 MG-UNIT per tablet Take 1 tablet by mouth 4 (four) times daily.  . cholecalciferol (VITAMIN D) 1000 UNITS tablet Take 1,000 Units by mouth daily. Take one tablet once daily  . fenofibrate (TRICOR) 145 MG tablet TAKE 1 TABLET (145 MG TOTAL) BY MOUTH DAILY.  Marland Kitchen latanoprost (XALATAN) 0.005 % ophthalmic solution Place 1 drop into both eyes at bedtime.  Marland Kitchen losartan (COZAAR) 50 MG tablet TAKE 1 TABLET BY MOUTH ONCE DAILY FOR BLOOD PRESSURE  . Multiple Vitamins-Minerals (CENTRUM SILVER PO) Take 0.5  tablets by mouth daily.   Marland Kitchen omeprazole (PRILOSEC) 20 MG capsule Take 20 mg by mouth daily. For acid reflux  . Plant Sterols and Stanols (CHOLEST OFF PO) Take 1 tablet by mouth 4 (four) times daily.   . RESTASIS MULTIDOSE 0.05 % ophthalmic emulsion Place 1 drop into both eyes 2 (two) times daily.    No facility-administered encounter medications on file as of 01/03/2017.     Review of Systems:  Review of Systems  Constitutional: Positive for malaise/fatigue. Negative for chills and fever.  HENT: Positive for congestion.        Postnasal drip, dry mouth, dry eyes  Eyes: Negative for blurred vision.  Respiratory: Negative for cough and shortness of breath.   Cardiovascular: Negative for chest pain, palpitations and leg swelling.  Gastrointestinal: Negative for abdominal pain, blood in stool, constipation and melena.  Genitourinary: Negative for dysuria.  Musculoskeletal: Positive for back pain and joint pain. Negative for falls.  Skin: Negative for itching and rash.  Neurological: Positive for weakness. Negative for dizziness and loss of consciousness.  Psychiatric/Behavioral: Negative for depression and memory loss. The patient does not have insomnia.     Health Maintenance  Topic Date Due  . Hepatitis C Screening  [birth year]  . TETANUS/TDAP  04/13/2015  . PNA vac Low Risk Adult (2 of 2 - PPSV23) 09/14/2015  . INFLUENZA VACCINE  11/10/2016  . MAMMOGRAM  04/16/2018  . COLONOSCOPY  12/07/2023  . DEXA SCAN  Completed    Physical Exam: Vitals:   01/03/17 1122  BP: 118/70  Pulse: 84  Temp: 98.6 F (37 C)  TempSrc: Oral  SpO2: 97%  Weight: 176 lb (79.8 kg)   Body mass index is 27.57 kg/m. Physical Exam  Constitutional: She is oriented to person, place, and time. She appears well-developed and well-nourished. No distress.  HENT:  Head: Normocephalic and atraumatic.  Cardiovascular: Normal rate, regular rhythm, normal heart sounds and intact distal pulses.   Pulmonary/Chest:  Effort normal and breath sounds normal. No respiratory distress.  Abdominal: Soft. Bowel sounds are normal. She exhibits no distension. There is no tenderness.  Musculoskeletal: Normal range of motion.  Tender over sacroiliac joints bilaterally  Neurological: She is alert and oriented to person, place, and time.  Skin: Skin is warm and dry. Capillary refill takes less than 2 seconds. There is pallor.  Psychiatric: She has a normal mood and affect. Her behavior is normal. Judgment and thought content normal.    Labs reviewed: Basic Metabolic Panel:  Recent Labs  01/12/16 1017 05/23/16 0118 08/06/16 0825  NA 139 135 140  K 4.7 3.6 4.5  CL 103 104 106  CO2 26 22 27   GLUCOSE 105* 169* 127*  BUN 12 16 15   CREATININE 0.86 0.97 1.02*  CALCIUM  9.6 9.3 9.7   Liver Function Tests:  Recent Labs  05/23/16 0118  AST 29  ALT 26  ALKPHOS 48  BILITOT 0.8  PROT 7.6  ALBUMIN 4.5    Recent Labs  05/23/16 0118  LIPASE 18   No results for input(s): AMMONIA in the last 8760 hours. CBC:  Recent Labs  05/23/16 0118  WBC 16.3*  HGB 14.6  HCT 43.7  MCV 91.0  PLT 277   Lipid Panel:  Recent Labs  01/12/16 1017 08/06/16 0825  CHOL 168 177  HDL 31* 30*  LDLCALC 104 111*  TRIG 164* 178*  CHOLHDL 5.4* 5.9*   Lab Results  Component Value Date   HGBA1C 6.3 (H) 08/06/2016    Assessment/Plan 1. Myopathy - per pt, but does not appear overtly weak--suspect more from her lumbar spine, but she's concerned it could be due to her fenofibrate b/c of the myopathy she had from statins (does not seem typical for this to me) - CK - CBC with Differential/Platelet - COMPLETE METABOLIC PANEL WITH GFR - Sedimentation rate  2. Sjogren's syndrome with keratoconjunctivitis sicca ([place]) -ongoing, hopefully eye treatment to lower pressure as a success so she'll be able to come off glaucoma drops that worsen the dry eyes - Sedimentation rate  3. Chronic right-sided low back pain with  right-sided sciatica -cont tylenol when needed for pain and, if pain persists and no signs of myopathy due to medication, will need MRI to further evaluate this  4. Benign essential hypertension -bp at goal with current therapy so cont same regimen and monitor  5. Mixed hyperlipidemia -ongoing, cont fenofibrate--if she developed myopathy from this, as well, will need to try her on repatha -had little success with zetia and myopathy with 2-3 of the statins by her report (noted in prior records)  6. Spinal stenosis in cervical region -stable lately, could be contributory to new essential tremor  7. Benign essential tremor -observe, no signs of parkinsonism  8. Need for influenza vaccination - Flu vaccine HIGH DOSE PF (Fluzone High dose) given with some convincing after she felt bad for 24 h after last year's vaccine  Labs/tests ordered:   Orders Placed This Encounter  Procedures  . Flu vaccine HIGH DOSE PF (Fluzone High dose)  . CK  . CBC with Differential/Platelet  . COMPLETE METABOLIC PANEL WITH GFR  . Sedimentation rate    Next appt:  05/05/2017 med mgt, labs before   Jarryd Gratz L. Arch Methot, D.O. [facility] [address], [place] [address] Cell Phone (Mon-Fri 8am-5pm):  [phone] On Call:  [phone] & follow prompts after 5pm & weekends Office Phone:  [phone] Office Fax:  [phone]

## 2017-01-04 ENCOUNTER — Other Ambulatory Visit: Payer: Self-pay | Admitting: Internal Medicine

## 2017-01-04 DIAGNOSIS — G8929 Other chronic pain: Secondary | ICD-10-CM

## 2017-01-04 DIAGNOSIS — M5441 Lumbago with sciatica, right side: Principal | ICD-10-CM

## 2017-01-04 DIAGNOSIS — G729 Myopathy, unspecified: Secondary | ICD-10-CM

## 2017-01-04 LAB — CBC WITH DIFFERENTIAL/PLATELET
Basophils Absolute: 126 cells/uL (ref 0–200)
Basophils Relative: 1.4 %
Eosinophils Absolute: 360 cells/uL (ref 15–500)
Eosinophils Relative: 4 %
HCT: 42.8 % (ref 35.0–45.0)
Hemoglobin: 14.4 g/dL (ref 11.7–15.5)
Lymphs Abs: 2079 cells/uL (ref 850–3900)
MCH: 30.2 pg (ref 27.0–33.0)
MCHC: 33.6 g/dL (ref 32.0–36.0)
MCV: 89.7 fL (ref 80.0–100.0)
MPV: 10.4 fL (ref 7.5–12.5)
Monocytes Relative: 8 %
Neutro Abs: 5715 cells/uL (ref 1500–7800)
Neutrophils Relative %: 63.5 %
Platelets: 325 10*3/uL (ref 140–400)
RBC: 4.77 10*6/uL (ref 3.80–5.10)
RDW: 12.3 % (ref 11.0–15.0)
Total Lymphocyte: 23.1 %
WBC mixed population: 720 cells/uL (ref 200–950)
WBC: 9 10*3/uL (ref 3.8–10.8)

## 2017-01-04 LAB — COMPLETE METABOLIC PANEL WITH GFR
AG Ratio: 1.8 (calc) (ref 1.0–2.5)
ALT: 27 U/L (ref 6–29)
AST: 24 U/L (ref 10–35)
Albumin: 4.4 g/dL (ref 3.6–5.1)
Alkaline phosphatase (APISO): 49 U/L (ref 33–130)
BUN: 13 mg/dL (ref 7–25)
CO2: 23 mmol/L (ref 20–32)
Calcium: 10 mg/dL (ref 8.6–10.4)
Chloride: 104 mmol/L (ref 98–110)
Creat: 0.87 mg/dL (ref 0.60–0.93)
GFR, Est African American: 78 mL/min/{1.73_m2} (ref >=60)
GFR, Est Non African American: 67 mL/min/{1.73_m2} (ref >=60)
Globulin: 2.5 g/dL (calc) (ref 1.9–3.7)
Glucose, Bld: 114 mg/dL (ref 65–139)
Potassium: 4.3 mmol/L (ref 3.5–5.3)
Sodium: 136 mmol/L (ref 135–146)
Total Bilirubin: 0.4 mg/dL (ref 0.2–1.2)
Total Protein: 6.9 g/dL (ref 6.1–8.1)

## 2017-01-04 LAB — SEDIMENTATION RATE: Sed Rate: 2 mm/h (ref 0–30)

## 2017-01-04 LAB — CK: Total CK: 117 U/L (ref 29–143)

## 2017-01-05 DIAGNOSIS — M5136 Other intervertebral disc degeneration, lumbar region: Secondary | ICD-10-CM | POA: Diagnosis not present

## 2017-01-05 DIAGNOSIS — M9905 Segmental and somatic dysfunction of pelvic region: Secondary | ICD-10-CM | POA: Diagnosis not present

## 2017-01-05 DIAGNOSIS — M9903 Segmental and somatic dysfunction of lumbar region: Secondary | ICD-10-CM | POA: Diagnosis not present

## 2017-01-05 DIAGNOSIS — M9904 Segmental and somatic dysfunction of sacral region: Secondary | ICD-10-CM | POA: Diagnosis not present

## 2017-01-19 DIAGNOSIS — M9904 Segmental and somatic dysfunction of sacral region: Secondary | ICD-10-CM | POA: Diagnosis not present

## 2017-01-19 DIAGNOSIS — M5136 Other intervertebral disc degeneration, lumbar region: Secondary | ICD-10-CM | POA: Diagnosis not present

## 2017-01-19 DIAGNOSIS — M9905 Segmental and somatic dysfunction of pelvic region: Secondary | ICD-10-CM | POA: Diagnosis not present

## 2017-01-19 DIAGNOSIS — M9903 Segmental and somatic dysfunction of lumbar region: Secondary | ICD-10-CM | POA: Diagnosis not present

## 2017-01-25 ENCOUNTER — Ambulatory Visit
Admission: RE | Admit: 2017-01-25 | Discharge: 2017-01-25 | Disposition: A | Discharge status: Home / Self Care | Payer: Medicare Other | Source: Ambulatory Visit | Attending: Internal Medicine | Admitting: Internal Medicine

## 2017-01-25 DIAGNOSIS — M5441 Lumbago with sciatica, right side: Principal | ICD-10-CM

## 2017-01-25 DIAGNOSIS — G729 Myopathy, unspecified: Secondary | ICD-10-CM

## 2017-01-25 DIAGNOSIS — G8929 Other chronic pain: Secondary | ICD-10-CM

## 2017-01-25 DIAGNOSIS — M48061 Spinal stenosis, lumbar region without neurogenic claudication: Secondary | ICD-10-CM | POA: Diagnosis not present

## 2017-02-01 ENCOUNTER — Telehealth: Payer: Self-pay

## 2017-02-01 DIAGNOSIS — M9905 Segmental and somatic dysfunction of pelvic region: Secondary | ICD-10-CM | POA: Diagnosis not present

## 2017-02-01 DIAGNOSIS — M9904 Segmental and somatic dysfunction of sacral region: Secondary | ICD-10-CM | POA: Diagnosis not present

## 2017-02-01 DIAGNOSIS — M9903 Segmental and somatic dysfunction of lumbar region: Secondary | ICD-10-CM | POA: Diagnosis not present

## 2017-02-01 DIAGNOSIS — M5136 Other intervertebral disc degeneration, lumbar region: Secondary | ICD-10-CM | POA: Diagnosis not present

## 2017-02-01 NOTE — Telephone Encounter (Signed)
Patient called to schedule an appointment with Dr.Reed, first available is 02/13/17.  Patient refused appointment with Sherrie Mustache, NP or Dr.Carter prior. Patient would like to see/consult with Dr.Reed only.  1.) Patient would like to discuss MRI completed on 01/25/17  2.)Patient would also like to know if Dr.Reed would be willing to write her a letter to be excused from [employer] due to decreased mobility as a result of leg pain. (Patient aware she will need to send in original [employer], If Dr.Reed is willing to write letter)   Felicia Hall and Dr.Reed please advise if patient can be worked in or if [facility] will call patient directly to address MRI.

## 2017-02-01 NOTE — Telephone Encounter (Signed)
I just finally did the result note about the lumbar MRI after I was at CME last week and covering provider did not address.  Please call her with the results and my suggestions.  I'm happy to do the jury duty letter.

## 2017-02-02 NOTE — Telephone Encounter (Signed)
Spoke with patient and advised results, appt scheduled 02/14/17 @ 11:30am.

## 2017-02-03 ENCOUNTER — Ambulatory Visit: Payer: Self-pay | Admitting: Internal Medicine

## 2017-02-03 DIAGNOSIS — H401113 Primary open-angle glaucoma, right eye, severe stage: Secondary | ICD-10-CM | POA: Diagnosis not present

## 2017-02-03 DIAGNOSIS — H04122 Dry eye syndrome of left lacrimal gland: Secondary | ICD-10-CM | POA: Diagnosis not present

## 2017-02-14 ENCOUNTER — Encounter: Payer: Self-pay | Admitting: Internal Medicine

## 2017-02-14 ENCOUNTER — Ambulatory Visit: Payer: Medicare Other | Admitting: Internal Medicine

## 2017-02-14 VITALS — BP 160/98 | HR 76 | Temp 98.3°F | Wt 176.0 lb

## 2017-02-14 DIAGNOSIS — M5441 Lumbago with sciatica, right side: Secondary | ICD-10-CM | POA: Diagnosis not present

## 2017-02-14 DIAGNOSIS — Z1211 Encounter for screening for malignant neoplasm of colon: Secondary | ICD-10-CM

## 2017-02-14 DIAGNOSIS — G8929 Other chronic pain: Secondary | ICD-10-CM | POA: Diagnosis not present

## 2017-02-14 DIAGNOSIS — M159 Polyosteoarthritis, unspecified: Secondary | ICD-10-CM

## 2017-02-14 NOTE — Progress Notes (Signed)
Location:  [facility] clinic Provider:  Akeel Reffner L. Mariea Clonts, D.O., C.M.D.  Code Status: DNR Goals of Care:  Advanced Directives 08/06/2016  Does Patient Have a Medical Advance Directive? Yes  Type of Paramedic of [place];Living will  Does patient want to make changes to medical advance directive? No - Patient declined  Copy of [place] in Chart? No - copy requested  Would patient like information on creating a medical advance directive? -     Chief Complaint  Patient presents with  . Follow-up    discuss MRI results    HPI: Patient is a 71 y.o. female with h/o sjogren's, generalized OA, chronic sinusitis, glaucoma seen today for follow up on MRI of her lumbar spine due to her progressive radicular symptoms.  Right hip hurts more when walking and left hurts more when sitting.  She is not excited about having an injection.  She also spoke with Dr. Teryl Lucy, her chiropractor, and he also recommended seeing the neurosurgeon for their opinion.  Also has a long way to walk for jury duty and cannot imagine making it from the parking garage to the courtyard.  Also sitting on the hard benches all day would make her pain worse.  Jury duty dismissal letter completed.    Is due again for cologuard.  Had a negative screen 3 years ago.  She is only 71.  She should have this again once more.    Past Medical History:  Diagnosis Date  . Acute upper respiratory infections of unspecified site   . Allergic rhinitis due to pollen   . Benign essential hypertension   . Cervical spondylosis without myelopathy   . Cervicalgia   . Diaphragmatic hernia without mention of obstruction or gangrene   . Disorder of bone and cartilage, unspecified   . Diverticulosis of colon (without mention of hemorrhage)   . Encounter for long-term (current) use of other medications   . Hyperlipidemia LDL goal < 100   . 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 Other and  unspecified hyperlipidemia   . Pain in joint, site unspecified   . Routine general medical examination at a health care facility   . Sjogren's syndrome ([place]) 10/27/2012  . Tear film insufficiency, unspecified   . Unspecified disorder of kidney and ureter   . Unspecified glaucoma(365.9)     Past Surgical History:  Procedure Laterality Date  . CATARACT EXTRACTION W/ INTRAOCULAR LENS  IMPLANT, BILATERAL Bilateral 12/2014   Dr. Prudencio Burly  . CHOLECYSTECTOMY  1993   Dr.Lindsey     Allergies  Allergen Reactions  . Statins Other (See Comments)    Muscle problems  . Allopurinol Rash  . Darvocet [Propoxyphene N-Acetaminophen] Rash    Outpatient Encounter Medications as of 02/14/2017  Medication Sig  . aspirin 81 MG tablet Take 81 mg by mouth daily. Take one tablet once daily  . calcium citrate-vitamin D (CITRACAL+D) 315-200 MG-UNIT per tablet Take 1 tablet by mouth 4 (four) times daily.  . cholecalciferol (VITAMIN D) 1000 UNITS tablet Take 1,000 Units by mouth daily. Take one tablet once daily  . fenofibrate (TRICOR) 145 MG tablet TAKE 1 TABLET (145 MG TOTAL) BY MOUTH DAILY.  Marland Kitchen losartan (COZAAR) 50 MG tablet TAKE 1 TABLET BY MOUTH ONCE DAILY FOR BLOOD PRESSURE  . Multiple Vitamins-Minerals (CENTRUM SILVER PO) Take 0.5 tablets by mouth daily.   Marland Kitchen omeprazole (PRILOSEC) 20 MG capsule Take 20 mg by mouth daily. For acid reflux  .  Plant Sterols and Stanols (CHOLEST OFF PO) Take 1 tablet by mouth 4 (four) times daily.   . timolol (TIMOPTIC) 0.5 % ophthalmic solution Place 1 drop daily into both eyes.  . [DISCONTINUED] latanoprost (XALATAN) 0.005 % ophthalmic solution Place 1 drop into both eyes at bedtime.  . [DISCONTINUED] RESTASIS MULTIDOSE 0.05 % ophthalmic emulsion Place 1 drop into both eyes 2 (two) times daily.    No facility-administered encounter medications on file as of 02/14/2017.     Review of Systems:  Review of Systems  Constitutional: Positive for malaise/fatigue. Negative for  chills and fever.  HENT:       Sjogren's  Eyes:       Sjogren's  Respiratory: Negative for shortness of breath.   Cardiovascular: Negative for chest pain, palpitations and leg swelling.  Gastrointestinal: Negative for abdominal pain.  Genitourinary: Negative for dysuria.  Musculoskeletal: Positive for back pain, joint pain and neck pain. Negative for falls.  Neurological: Positive for tingling and sensory change. Negative for weakness.  Psychiatric/Behavioral: Negative for memory loss.    Health Maintenance  Topic Date Due  . Hepatitis C Screening  05-16-1945  . TETANUS/TDAP  04/13/2015  . PNA vac Low Risk Adult (2 of 2 - PPSV23) 09/14/2015  . MAMMOGRAM  04/16/2018  . COLONOSCOPY  12/07/2023  . INFLUENZA VACCINE  Completed  . DEXA SCAN  Completed    Physical Exam: Vitals:   02/14/17 1137  BP: (!) 160/98  Pulse: 76  Temp: 98.3 F (36.8 C)  TempSrc: Oral  SpO2: 95%  Weight: 176 lb (79.8 kg)   Body mass index is 27.57 kg/m. Physical Exam  Constitutional: She appears well-developed and well-nourished. No distress.  Cardiovascular: Normal rate, regular rhythm, normal heart sounds and intact distal pulses.  Pulmonary/Chest: Effort normal and breath sounds normal. No respiratory distress.  Abdominal: Bowel sounds are normal.  Musculoskeletal: Normal range of motion. She exhibits tenderness.  Over SI joints and into buttocks bilaterally  Skin: Skin is warm and dry. There is pallor.  Psychiatric: She has a normal mood and affect.    Labs reviewed: Basic Metabolic Panel: Recent Labs    05/23/16 0118 08/06/16 0825 01/03/17 1228  NA 135 140 136  K 3.6 4.5 4.3  CL 104 106 104  CO2 22 27 23   GLUCOSE 169* 127* 114  BUN 16 15 13   CREATININE 0.97 1.02* 0.87  CALCIUM 9.3 9.7 10.0   Liver Function Tests: Recent Labs    05/23/16 0118 01/03/17 1228  AST 29 24  ALT 26 27  ALKPHOS 48  --   BILITOT 0.8 0.4  PROT 7.6 6.9  ALBUMIN 4.5  --    Recent Labs     05/23/16 0118  LIPASE 18   No results for input(s): AMMONIA in the last 8760 hours. CBC: Recent Labs    05/23/16 0118 01/03/17 1228  WBC 16.3* 9.0  NEUTROABS  --  5,715  HGB 14.6 14.4  HCT 43.7 42.8  MCV 91.0 89.7  PLT 277 325   Lipid Panel: Recent Labs    08/06/16 0825  CHOL 177  HDL 30*  LDLCALC 111*  TRIG 178*  CHOLHDL 5.9*   Lab Results  Component Value Date   HGBA1C 6.3 (H) 08/06/2016    Procedures since last visit: Mr Lumbar Spine Wo Contrast  Result Date: 01/25/2017 CLINICAL DATA:  Chronic low back pain radiating into the buttocks and posterior aspect of the upper legs bilaterally, worse on the right. Symptoms for 5  years. EXAM: MRI LUMBAR SPINE WITHOUT CONTRAST TECHNIQUE: Multiplanar, multisequence MR imaging of the lumbar spine was performed. No intravenous contrast was administered. COMPARISON:  CT abdomen and pelvis 05/23/2016. FINDINGS: Segmentation:  Standard. Alignment:  Maintained. Vertebrae: No fracture or worrisome lesion. Scattered degenerative endplate signal change and a few hemangiomas noted. 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 Conus medullaris: Extends to the T12-L1 level and appears normal. Paraspinal and other soft tissues: Negative. Disc levels: T11-12 is imaged in the sagittal plane only. There is a small central protrusion but the central canal and foramina are open. T12-L1:  Shallow central protrusion without stenosis. L1-2:  Minimal disc bulge without stenosis. L2-3: Shallow disc bulge and mild facet degenerative disease. There is mild narrowing in the subarticular recesses. The foramina are open. L3-4: Shallow broad-based disc bulge and mild facet degenerative change. The central canal is mildly narrowed. The foramina are open. L4-5: Ligamentum flavum thickening and a shallow broad-based disc bulge cause moderate central canal stenosis with some narrowing in the subarticular recesses. Neural foramina are widely patent. L5-S1: There is advanced bilateral facet  arthropathy. Minimal disc bulge is present. Facet degenerative disease on the left results in narrowing in the subarticular recess which could impact the descending left S1 root. The thecal sac and foramina are widely patent. IMPRESSION: Moderate central canal stenosis with some narrowing in the subarticular recesses at L4-5 due to a shallow disc bulge and ligamentum flavum thickening. Facet arthropathy on the left at L5-S1 results in narrowing in the subarticular recess which could impact the descending left S1 root Shallow disc bulge and mild facet degenerative disease at L2-3 cause mild narrowing in the subarticular recesses. Electronically Signed   By: Inge Rise M.D.   On: 01/25/2017 10:57    Assessment/Plan 1. Chronic right-sided low back pain with right-sided sciatica -noted to have some spinal stenosis -chiropractor and I agree she should get neurosurgery opinion - Ambulatory referral to Neurosurgery Dr. Sherwood Gambler  2. Generalized osteoarthritis of multiple sites - ongoing, uses tylenol for pain, longstanding  - Ambulatory referral to Neurosurgery  3. Colon cancer screening -due for cologuard recheck (3 yr mark)--referral placed   Labs/tests ordered:  Orders Placed This Encounter  Procedures  . Ambulatory referral to Neurosurgery    Referral Priority:   Routine    Referral Type:   Surgical    Referral Reason:   Specialty Services Required    Requested Specialty:   Neurosurgery    Number of Visits Requested:   1    Next appt:  05/05/2017--keep as scheduled   Alyana Kreiter L. Keaghan Bowens, D.O. [facility] [address], [place] [address] Cell Phone (Mon-Fri 8am-5pm):  [phone] On Call:  [phone] & follow prompts after 5pm & weekends Office Phone:  [phone] Office Fax:  [phone]

## 2017-02-16 DIAGNOSIS — M9904 Segmental and somatic dysfunction of sacral region: Secondary | ICD-10-CM | POA: Diagnosis not present

## 2017-02-16 DIAGNOSIS — M9905 Segmental and somatic dysfunction of pelvic region: Secondary | ICD-10-CM | POA: Diagnosis not present

## 2017-02-16 DIAGNOSIS — M9903 Segmental and somatic dysfunction of lumbar region: Secondary | ICD-10-CM | POA: Diagnosis not present

## 2017-02-16 DIAGNOSIS — M5136 Other intervertebral disc degeneration, lumbar region: Secondary | ICD-10-CM | POA: Diagnosis not present

## 2017-02-22 DIAGNOSIS — H02412 Mechanical ptosis of left eyelid: Secondary | ICD-10-CM | POA: Diagnosis not present

## 2017-02-28 DIAGNOSIS — Z1212 Encounter for screening for malignant neoplasm of rectum: Secondary | ICD-10-CM | POA: Diagnosis not present

## 2017-02-28 DIAGNOSIS — Z1211 Encounter for screening for malignant neoplasm of colon: Secondary | ICD-10-CM | POA: Diagnosis not present

## 2017-02-28 LAB — COLOGUARD: Cologuard: NEGATIVE

## 2017-03-01 DIAGNOSIS — M9901 Segmental and somatic dysfunction of cervical region: Secondary | ICD-10-CM | POA: Diagnosis not present

## 2017-03-01 DIAGNOSIS — M9902 Segmental and somatic dysfunction of thoracic region: Secondary | ICD-10-CM | POA: Diagnosis not present

## 2017-03-01 DIAGNOSIS — M5136 Other intervertebral disc degeneration, lumbar region: Secondary | ICD-10-CM | POA: Diagnosis not present

## 2017-03-01 DIAGNOSIS — M50322 Other cervical disc degeneration at C5-C6 level: Secondary | ICD-10-CM | POA: Diagnosis not present

## 2017-03-03 DIAGNOSIS — M9902 Segmental and somatic dysfunction of thoracic region: Secondary | ICD-10-CM | POA: Diagnosis not present

## 2017-03-03 DIAGNOSIS — M50322 Other cervical disc degeneration at C5-C6 level: Secondary | ICD-10-CM | POA: Diagnosis not present

## 2017-03-03 DIAGNOSIS — M9901 Segmental and somatic dysfunction of cervical region: Secondary | ICD-10-CM | POA: Diagnosis not present

## 2017-03-03 DIAGNOSIS — M5136 Other intervertebral disc degeneration, lumbar region: Secondary | ICD-10-CM | POA: Diagnosis not present

## 2017-03-10 ENCOUNTER — Encounter: Payer: Self-pay | Admitting: *Deleted

## 2017-03-11 DIAGNOSIS — M5136 Other intervertebral disc degeneration, lumbar region: Secondary | ICD-10-CM | POA: Diagnosis not present

## 2017-03-11 DIAGNOSIS — M9902 Segmental and somatic dysfunction of thoracic region: Secondary | ICD-10-CM | POA: Diagnosis not present

## 2017-03-11 DIAGNOSIS — M50322 Other cervical disc degeneration at C5-C6 level: Secondary | ICD-10-CM | POA: Diagnosis not present

## 2017-03-11 DIAGNOSIS — M9901 Segmental and somatic dysfunction of cervical region: Secondary | ICD-10-CM | POA: Diagnosis not present

## 2017-03-15 ENCOUNTER — Other Ambulatory Visit: Payer: Self-pay | Admitting: Neurosurgery

## 2017-03-15 DIAGNOSIS — M549 Dorsalgia, unspecified: Secondary | ICD-10-CM | POA: Diagnosis not present

## 2017-03-15 DIAGNOSIS — M5136 Other intervertebral disc degeneration, lumbar region: Secondary | ICD-10-CM | POA: Diagnosis not present

## 2017-03-15 DIAGNOSIS — M48062 Spinal stenosis, lumbar region with neurogenic claudication: Secondary | ICD-10-CM | POA: Diagnosis not present

## 2017-03-15 DIAGNOSIS — M546 Pain in thoracic spine: Secondary | ICD-10-CM | POA: Diagnosis not present

## 2017-03-15 DIAGNOSIS — M47816 Spondylosis without myelopathy or radiculopathy, lumbar region: Secondary | ICD-10-CM | POA: Diagnosis not present

## 2017-03-23 ENCOUNTER — Other Ambulatory Visit: Payer: Self-pay | Admitting: Internal Medicine

## 2017-03-25 DIAGNOSIS — M9902 Segmental and somatic dysfunction of thoracic region: Secondary | ICD-10-CM | POA: Diagnosis not present

## 2017-03-25 DIAGNOSIS — M5136 Other intervertebral disc degeneration, lumbar region: Secondary | ICD-10-CM | POA: Diagnosis not present

## 2017-03-25 DIAGNOSIS — M9901 Segmental and somatic dysfunction of cervical region: Secondary | ICD-10-CM | POA: Diagnosis not present

## 2017-03-25 DIAGNOSIS — M50322 Other cervical disc degeneration at C5-C6 level: Secondary | ICD-10-CM | POA: Diagnosis not present

## 2017-04-01 DIAGNOSIS — M50322 Other cervical disc degeneration at C5-C6 level: Secondary | ICD-10-CM | POA: Diagnosis not present

## 2017-04-01 DIAGNOSIS — M9901 Segmental and somatic dysfunction of cervical region: Secondary | ICD-10-CM | POA: Diagnosis not present

## 2017-04-01 DIAGNOSIS — M5136 Other intervertebral disc degeneration, lumbar region: Secondary | ICD-10-CM | POA: Diagnosis not present

## 2017-04-01 DIAGNOSIS — M9902 Segmental and somatic dysfunction of thoracic region: Secondary | ICD-10-CM | POA: Diagnosis not present

## 2017-04-08 DIAGNOSIS — M50322 Other cervical disc degeneration at C5-C6 level: Secondary | ICD-10-CM | POA: Diagnosis not present

## 2017-04-08 DIAGNOSIS — M9901 Segmental and somatic dysfunction of cervical region: Secondary | ICD-10-CM | POA: Diagnosis not present

## 2017-04-08 DIAGNOSIS — M9902 Segmental and somatic dysfunction of thoracic region: Secondary | ICD-10-CM | POA: Diagnosis not present

## 2017-04-08 DIAGNOSIS — M5136 Other intervertebral disc degeneration, lumbar region: Secondary | ICD-10-CM | POA: Diagnosis not present

## 2017-04-18 NOTE — Pre-Procedure Instructions (Signed)
Felicia Hall  04/18/2017      [id number] [place], [place] [address] [place] [address] Phone: [phone] Fax: [phone]  [place], [place] [facility] [address] [place] [address] Phone: [phone] Fax: [phone]    Your procedure is scheduled on April 21, 2017 .  Report to [facility] Admitting at 530 AM.  Call this number if you have problems the morning of surgery:  [phone]   Remember:  Do not eat food or drink liquids after midnight.  Take these medicines the morning of surgery with A SIP OF WATER Azopt eyedrops.  7 days prior to surgery STOP taking any Aspirin (unless otherwise instructed by your surgeon), Aleve, Naproxen, Ibuprofen, Motrin, Advil, Goody's, BC's, all herbal medications, fish oil, and all vitamins  Continue all other medications as instructed by your physician except follow the above medication instructions before surgery   Do not wear jewelry, make-up or nail polish.  Do not wear lotions, powders, or perfumes, or deodorant.  Do not shave 48 hours prior to surgery.    Do not bring valuables to the hospital.  [facility] is not responsible for any belongings or valuables.  Contacts, dentures or bridgework may not be worn into surgery.  Leave your suitcase in the car.  After surgery it may be brought to your room.  For patients admitted to the hospital, discharge time will be determined by your treatment team.  Patients discharged the day of surgery will not be allowed to drive home.   Special instructions:   [place]- Preparing For Surgery  Before surgery, you can play an important role. Because skin is not sterile, your skin needs to be as free of germs as possible. You can reduce the number of germs on your skin by washing with CHG (chlorahexidine gluconate) Soap before surgery.  CHG is an antiseptic cleaner which kills germs and  bonds with the skin to continue killing germs even after washing.  Please do not use if you have an allergy to CHG or antibacterial soaps. If your skin becomes reddened/irritated stop using the CHG.  Do not shave (including legs and underarms) for at least 48 hours prior to first CHG shower. It is OK to shave your face.  Please follow these instructions carefully.   1. Shower the NIGHT BEFORE SURGERY and the MORNING OF SURGERY with CHG.   2. If you chose to wash your hair, wash your hair first as usual with your normal shampoo.  3. After you shampoo, rinse your hair and body thoroughly to remove the shampoo.  4. Use CHG as you would any other liquid soap. You can apply CHG directly to the skin and wash gently with a scrungie or a clean washcloth.   5. Apply the CHG Soap to your body ONLY FROM THE NECK DOWN.  Do not use on open wounds or open sores. Avoid contact with your eyes, ears, mouth and genitals (private parts). Wash Face and genitals (private parts)  with your normal soap.  6. Wash thoroughly, paying special attention to the area where your surgery will be performed.  7. Thoroughly rinse your body with warm water from the neck down.  8. DO NOT shower/wash with your normal soap after using and rinsing off the CHG Soap.  9. Pat yourself dry with a CLEAN TOWEL.  10. Wear CLEAN PAJAMAS to bed the night  before surgery, wear comfortable clothes the morning of surgery  11. Place CLEAN SHEETS on your bed the night of your first shower and DO NOT SLEEP WITH PETS.   Day of Surgery: Do not apply any deodorants/lotions. Please wear clean clothes to the hospital/surgery center.    Please read over the following fact sheets that you were given. Pain Booklet, Coughing and Deep Breathing and Surgical Site Infection Prevention

## 2017-04-19 ENCOUNTER — Encounter (HOSPITAL_COMMUNITY): Payer: Self-pay

## 2017-04-19 ENCOUNTER — Other Ambulatory Visit: Payer: Self-pay

## 2017-04-19 ENCOUNTER — Encounter (HOSPITAL_COMMUNITY)
Admission: RE | Admit: 2017-04-19 | Discharge: 2017-04-19 | Disposition: A | Discharge status: Home / Self Care | Payer: Medicare Other | Source: Ambulatory Visit | Attending: Neurosurgery | Admitting: Neurosurgery

## 2017-04-19 DIAGNOSIS — M47812 Spondylosis without myelopathy or radiculopathy, cervical region: Secondary | ICD-10-CM | POA: Diagnosis not present

## 2017-04-19 DIAGNOSIS — M9902 Segmental and somatic dysfunction of thoracic region: Secondary | ICD-10-CM | POA: Diagnosis not present

## 2017-04-19 DIAGNOSIS — H409 Unspecified glaucoma: Secondary | ICD-10-CM | POA: Diagnosis not present

## 2017-04-19 DIAGNOSIS — Z79899 Other long term (current) drug therapy: Secondary | ICD-10-CM | POA: Diagnosis not present

## 2017-04-19 DIAGNOSIS — M3501 Sicca syndrome with keratoconjunctivitis: Secondary | ICD-10-CM | POA: Diagnosis not present

## 2017-04-19 DIAGNOSIS — K219 Gastro-esophageal reflux disease without esophagitis: Secondary | ICD-10-CM | POA: Diagnosis not present

## 2017-04-19 DIAGNOSIS — G250 Essential tremor: Secondary | ICD-10-CM | POA: Diagnosis not present

## 2017-04-19 DIAGNOSIS — M50322 Other cervical disc degeneration at C5-C6 level: Secondary | ICD-10-CM | POA: Diagnosis not present

## 2017-04-19 DIAGNOSIS — G894 Chronic pain syndrome: Secondary | ICD-10-CM | POA: Diagnosis not present

## 2017-04-19 DIAGNOSIS — K581 Irritable bowel syndrome with constipation: Secondary | ICD-10-CM | POA: Diagnosis not present

## 2017-04-19 DIAGNOSIS — Z885 Allergy status to narcotic agent status: Secondary | ICD-10-CM | POA: Diagnosis not present

## 2017-04-19 DIAGNOSIS — M4802 Spinal stenosis, cervical region: Secondary | ICD-10-CM | POA: Diagnosis not present

## 2017-04-19 DIAGNOSIS — I10 Essential (primary) hypertension: Secondary | ICD-10-CM | POA: Diagnosis not present

## 2017-04-19 DIAGNOSIS — M1990 Unspecified osteoarthritis, unspecified site: Secondary | ICD-10-CM | POA: Diagnosis not present

## 2017-04-19 DIAGNOSIS — M5136 Other intervertebral disc degeneration, lumbar region: Secondary | ICD-10-CM | POA: Diagnosis not present

## 2017-04-19 DIAGNOSIS — M5441 Lumbago with sciatica, right side: Secondary | ICD-10-CM | POA: Diagnosis not present

## 2017-04-19 DIAGNOSIS — M47896 Other spondylosis, lumbar region: Secondary | ICD-10-CM | POA: Diagnosis not present

## 2017-04-19 DIAGNOSIS — E782 Mixed hyperlipidemia: Secondary | ICD-10-CM | POA: Diagnosis not present

## 2017-04-19 DIAGNOSIS — K449 Diaphragmatic hernia without obstruction or gangrene: Secondary | ICD-10-CM | POA: Diagnosis not present

## 2017-04-19 DIAGNOSIS — Z888 Allergy status to other drugs, medicaments and biological substances status: Secondary | ICD-10-CM | POA: Diagnosis not present

## 2017-04-19 DIAGNOSIS — M48062 Spinal stenosis, lumbar region with neurogenic claudication: Secondary | ICD-10-CM | POA: Diagnosis not present

## 2017-04-19 DIAGNOSIS — M9901 Segmental and somatic dysfunction of cervical region: Secondary | ICD-10-CM | POA: Diagnosis not present

## 2017-04-19 DIAGNOSIS — F329 Major depressive disorder, single episode, unspecified: Secondary | ICD-10-CM | POA: Diagnosis not present

## 2017-04-19 HISTORY — DX: Prediabetes: R73.03

## 2017-04-19 HISTORY — DX: Gastro-esophageal reflux disease without esophagitis: K21.9

## 2017-04-19 HISTORY — DX: Personal history of other diseases of the digestive system: Z87.19

## 2017-04-19 LAB — BASIC METABOLIC PANEL
Anion gap: 9 (ref 5–15)
BUN: 15 mg/dL (ref 6–20)
CO2: 25 mmol/L (ref 22–32)
Calcium: 10.1 mg/dL (ref 8.9–10.3)
Chloride: 103 mmol/L (ref 101–111)
Creatinine, Ser: 0.96 mg/dL (ref 0.44–1.00)
GFR calc Af Amer: >60 mL/min (ref >60)
GFR calc non Af Amer: 58 mL/min — ABNORMAL LOW (ref >60)
Glucose, Bld: 103 mg/dL — ABNORMAL HIGH (ref 65–99)
Potassium: 3.7 mmol/L (ref 3.5–5.1)
Sodium: 137 mmol/L (ref 135–145)

## 2017-04-19 LAB — CBC
HCT: 45.6 % (ref 36.0–46.0)
Hemoglobin: 14.4 g/dL (ref 12.0–15.0)
MCH: 29.4 pg (ref 26.0–34.0)
MCHC: 31.6 g/dL (ref 30.0–36.0)
MCV: 93.3 fL (ref 78.0–100.0)
Platelets: 326 10*3/uL (ref 150–400)
RBC: 4.89 MIL/uL (ref 3.87–5.11)
RDW: 13.1 % (ref 11.5–15.5)
WBC: 9.2 10*3/uL (ref 4.0–10.5)

## 2017-04-19 LAB — TYPE AND SCREEN
ABO/RH(D): A NEG
Antibody Screen: NEGATIVE

## 2017-04-19 LAB — SURGICAL PCR SCREEN
MRSA, PCR: NEGATIVE
Staphylococcus aureus: NEGATIVE

## 2017-04-19 LAB — ABO/RH: ABO/RH(D): A NEG

## 2017-04-20 NOTE — Anesthesia Preprocedure Evaluation (Addendum)
Anesthesia Evaluation  Patient identified by MRN, date of birth, ID band Patient awake    Reviewed: Allergy & Precautions, NPO status , Patient's Chart, lab work & pertinent test results  Airway Mallampati: III   Neck ROM: Full    Dental  (+) Dental Advisory Given, Teeth Intact   Pulmonary neg pulmonary ROS, neg COPD,    Pulmonary exam normal breath sounds clear to auscultation       Cardiovascular hypertension, Pt. on medications (-) Past MI Normal cardiovascular exam Rhythm:Regular Rate:Normal     Neuro/Psych neg Seizures Depression Cerviclagia, Neurogenic claudication    GI/Hepatic Neg liver ROS, hiatal hernia, GERD  Medicated and Controlled,  Endo/Other  negative endocrine ROS  Renal/GU negative Renal ROS  negative genitourinary   Musculoskeletal  (+) Arthritis ,   Abdominal   Peds  Hematology negative hematology ROS (+)   Anesthesia Other Findings Glaucoma, Keratoconjunctivitis, Sjogren's syndrome  Reproductive/Obstetrics                            Anesthesia Physical Anesthesia Plan  ASA: III  Anesthesia Plan: General   Post-op Pain Management:    Induction: Intravenous  PONV Risk Score and Plan: 4 or greater and Treatment may vary due to age or medical condition, Dexamethasone and Ondansetron  Airway Management Planned: Oral ETT  Additional Equipment: None  Intra-op Plan:   Post-operative Plan: Extubation in OR  Informed Consent: I have reviewed the patients History and Physical, chart, labs and discussed the procedure including the risks, benefits and alternatives for the proposed anesthesia with the patient or authorized representative who has indicated his/her understanding and acceptance.   Dental advisory given  Plan Discussed with: CRNA  Anesthesia Plan Comments: (Eye lube prior to taping given hx of keratoconjunctivitis. OGT to suction stomach prior to  flipping)       Anesthesia Quick Evaluation

## 2017-04-21 ENCOUNTER — Ambulatory Visit (HOSPITAL_COMMUNITY): Payer: Medicare Other

## 2017-04-21 ENCOUNTER — Ambulatory Visit (HOSPITAL_COMMUNITY): Payer: Medicare Other | Admitting: Anesthesiology

## 2017-04-21 ENCOUNTER — Encounter (HOSPITAL_COMMUNITY)
Admission: RE | Disposition: A | Discharge status: Home / Self Care | Payer: Self-pay | Source: Ambulatory Visit | Attending: Neurosurgery

## 2017-04-21 ENCOUNTER — Observation Stay (HOSPITAL_COMMUNITY)
Admission: RE | Admit: 2017-04-21 | Discharge: 2017-04-22 | Disposition: A | Discharge status: Home / Self Care | Payer: Medicare Other | Source: Ambulatory Visit | Attending: Neurosurgery | Admitting: Neurosurgery

## 2017-04-21 DIAGNOSIS — G894 Chronic pain syndrome: Secondary | ICD-10-CM | POA: Insufficient documentation

## 2017-04-21 DIAGNOSIS — F329 Major depressive disorder, single episode, unspecified: Secondary | ICD-10-CM | POA: Insufficient documentation

## 2017-04-21 DIAGNOSIS — M47896 Other spondylosis, lumbar region: Secondary | ICD-10-CM | POA: Diagnosis not present

## 2017-04-21 DIAGNOSIS — K219 Gastro-esophageal reflux disease without esophagitis: Secondary | ICD-10-CM | POA: Insufficient documentation

## 2017-04-21 DIAGNOSIS — K581 Irritable bowel syndrome with constipation: Secondary | ICD-10-CM | POA: Diagnosis not present

## 2017-04-21 DIAGNOSIS — M48062 Spinal stenosis, lumbar region with neurogenic claudication: Secondary | ICD-10-CM | POA: Diagnosis not present

## 2017-04-21 DIAGNOSIS — E782 Mixed hyperlipidemia: Secondary | ICD-10-CM | POA: Diagnosis not present

## 2017-04-21 DIAGNOSIS — Z888 Allergy status to other drugs, medicaments and biological substances status: Secondary | ICD-10-CM | POA: Diagnosis not present

## 2017-04-21 DIAGNOSIS — M47816 Spondylosis without myelopathy or radiculopathy, lumbar region: Secondary | ICD-10-CM | POA: Diagnosis not present

## 2017-04-21 DIAGNOSIS — K449 Diaphragmatic hernia without obstruction or gangrene: Secondary | ICD-10-CM | POA: Insufficient documentation

## 2017-04-21 DIAGNOSIS — M5441 Lumbago with sciatica, right side: Secondary | ICD-10-CM | POA: Diagnosis not present

## 2017-04-21 DIAGNOSIS — G250 Essential tremor: Secondary | ICD-10-CM | POA: Diagnosis not present

## 2017-04-21 DIAGNOSIS — M4802 Spinal stenosis, cervical region: Secondary | ICD-10-CM | POA: Diagnosis not present

## 2017-04-21 DIAGNOSIS — Z79899 Other long term (current) drug therapy: Secondary | ICD-10-CM | POA: Diagnosis not present

## 2017-04-21 DIAGNOSIS — Z885 Allergy status to narcotic agent status: Secondary | ICD-10-CM | POA: Diagnosis not present

## 2017-04-21 DIAGNOSIS — M5136 Other intervertebral disc degeneration, lumbar region: Secondary | ICD-10-CM | POA: Insufficient documentation

## 2017-04-21 DIAGNOSIS — Z419 Encounter for procedure for purposes other than remedying health state, unspecified: Secondary | ICD-10-CM

## 2017-04-21 DIAGNOSIS — M3501 Sicca syndrome with keratoconjunctivitis: Secondary | ICD-10-CM | POA: Diagnosis not present

## 2017-04-21 DIAGNOSIS — M47812 Spondylosis without myelopathy or radiculopathy, cervical region: Secondary | ICD-10-CM | POA: Diagnosis not present

## 2017-04-21 DIAGNOSIS — I10 Essential (primary) hypertension: Secondary | ICD-10-CM | POA: Diagnosis not present

## 2017-04-21 DIAGNOSIS — M1990 Unspecified osteoarthritis, unspecified site: Secondary | ICD-10-CM | POA: Diagnosis not present

## 2017-04-21 DIAGNOSIS — M4306 Spondylolysis, lumbar region: Secondary | ICD-10-CM | POA: Diagnosis not present

## 2017-04-21 DIAGNOSIS — Z981 Arthrodesis status: Secondary | ICD-10-CM | POA: Diagnosis not present

## 2017-04-21 DIAGNOSIS — H409 Unspecified glaucoma: Secondary | ICD-10-CM | POA: Diagnosis not present

## 2017-04-21 HISTORY — PX: LUMBAR LAMINECTOMY/DECOMPRESSION MICRODISCECTOMY: SHX5026

## 2017-04-21 SURGERY — LUMBAR LAMINECTOMY/DECOMPRESSION MICRODISCECTOMY 2 LEVELS
Anesthesia: General | Site: Back

## 2017-04-21 MED ORDER — DEXAMETHASONE SODIUM PHOSPHATE 10 MG/ML IJ SOLN
INTRAMUSCULAR | Status: DC | PRN
  Administered 2017-04-21: 10 mg via INTRAVENOUS

## 2017-04-21 MED ORDER — METHYLPREDNISOLONE ACETATE 80 MG/ML IJ SUSP
INTRAMUSCULAR | Status: DC | PRN
  Administered 2017-04-21: 80 mg

## 2017-04-21 MED ORDER — CYCLOBENZAPRINE HCL 5 MG PO TABS
5.0000 mg | ORAL_TABLET | Freq: Three times a day (TID) | ORAL | Status: DC | PRN
  Administered 2017-04-21: 10 mg via ORAL
  Filled 2017-04-21: qty 1

## 2017-04-21 MED ORDER — BUPIVACAINE HCL (PF) 0.5 % IJ SOLN
INTRAMUSCULAR | Status: DC | PRN
  Administered 2017-04-21: 10 mL

## 2017-04-21 MED ORDER — OXYCODONE HCL 5 MG PO TABS
ORAL_TABLET | ORAL | Status: AC
  Administered 2017-04-21: 5 mg via ORAL
  Filled 2017-04-21: qty 1

## 2017-04-21 MED ORDER — LIDOCAINE-EPINEPHRINE 1 %-1:100000 IJ SOLN
INTRAMUSCULAR | Status: DC | PRN
  Administered 2017-04-21: 10 mL

## 2017-04-21 MED ORDER — THROMBIN (RECOMBINANT) 20000 UNITS EX SOLR
CUTANEOUS | Status: DC | PRN
  Administered 2017-04-21: 20000 [IU] via TOPICAL

## 2017-04-21 MED ORDER — ONDANSETRON HCL 4 MG/2ML IJ SOLN: 4.0000 mg | Freq: Four times a day (QID) | INTRAMUSCULAR | Status: DC | PRN

## 2017-04-21 MED ORDER — ARTIFICIAL TEARS OPHTHALMIC OINT
TOPICAL_OINTMENT | OPHTHALMIC | Status: DC | PRN
  Administered 2017-04-21: 1 via OPHTHALMIC

## 2017-04-21 MED ORDER — SODIUM CHLORIDE 0.9 % IV SOLN: 250.0000 mL | INTRAVENOUS | Status: DC

## 2017-04-21 MED ORDER — THROMBIN (RECOMBINANT) 20000 UNITS EX SOLR
CUTANEOUS | Status: AC
  Filled 2017-04-21: qty 20000

## 2017-04-21 MED ORDER — HYDROXYZINE HCL 50 MG/ML IM SOLN: 50.0000 mg | INTRAMUSCULAR | Status: DC | PRN

## 2017-04-21 MED ORDER — HYDROXYZINE HCL 25 MG PO TABS: 50.0000 mg | ORAL_TABLET | ORAL | Status: DC | PRN

## 2017-04-21 MED ORDER — FLEET ENEMA 7-19 GM/118ML RE ENEM: 1.0000 | ENEMA | Freq: Once | RECTAL | Status: DC | PRN

## 2017-04-21 MED ORDER — FENTANYL CITRATE (PF) 250 MCG/5ML IJ SOLN
INTRAMUSCULAR | Status: AC
  Filled 2017-04-21: qty 5

## 2017-04-21 MED ORDER — FENTANYL CITRATE (PF) 100 MCG/2ML IJ SOLN
INTRAMUSCULAR | Status: AC
  Filled 2017-04-21: qty 2

## 2017-04-21 MED ORDER — BUPIVACAINE HCL (PF) 0.5 % IJ SOLN
INTRAMUSCULAR | Status: AC
  Filled 2017-04-21: qty 30

## 2017-04-21 MED ORDER — ONDANSETRON HCL 4 MG PO TABS: 4.0000 mg | ORAL_TABLET | Freq: Four times a day (QID) | ORAL | Status: DC | PRN

## 2017-04-21 MED ORDER — SODIUM CHLORIDE 0.9% FLUSH
3.0000 mL | Freq: Two times a day (BID) | INTRAVENOUS | Status: DC
  Administered 2017-04-21: 3 mL via INTRAVENOUS

## 2017-04-21 MED ORDER — OXYCODONE HCL 5 MG/5ML PO SOLN: 5.0000 mg | Freq: Once | ORAL | Status: AC | PRN

## 2017-04-21 MED ORDER — ONDANSETRON HCL 4 MG/2ML IJ SOLN: 4.0000 mg | Freq: Once | INTRAMUSCULAR | Status: DC | PRN

## 2017-04-21 MED ORDER — FENTANYL CITRATE (PF) 100 MCG/2ML IJ SOLN
INTRAMUSCULAR | Status: DC | PRN
  Administered 2017-04-21 (×2): 50 ug via INTRAVENOUS
  Administered 2017-04-21: 100 ug via INTRAVENOUS

## 2017-04-21 MED ORDER — HEMOSTATIC AGENTS (NO CHARGE) OPTIME
TOPICAL | Status: DC | PRN
  Administered 2017-04-21: 1 via TOPICAL

## 2017-04-21 MED ORDER — PROPOFOL 10 MG/ML IV BOLUS
INTRAVENOUS | Status: AC
  Filled 2017-04-21: qty 20

## 2017-04-21 MED ORDER — FENTANYL CITRATE (PF) 100 MCG/2ML IJ SOLN
25.0000 ug | INTRAMUSCULAR | Status: DC | PRN
  Administered 2017-04-21: 50 ug via INTRAVENOUS

## 2017-04-21 MED ORDER — LACTATED RINGERS IV SOLN
INTRAVENOUS | Status: DC | PRN
  Administered 2017-04-21: 07:00:00 via INTRAVENOUS

## 2017-04-21 MED ORDER — LOSARTAN POTASSIUM 50 MG PO TABS
50.0000 mg | ORAL_TABLET | Freq: Every day | ORAL | Status: DC
  Administered 2017-04-21: 50 mg via ORAL
  Filled 2017-04-21: qty 1

## 2017-04-21 MED ORDER — BRINZOLAMIDE 1 % OP SUSP
1.0000 [drp] | Freq: Two times a day (BID) | OPHTHALMIC | Status: DC
  Filled 2017-04-21: qty 10

## 2017-04-21 MED ORDER — KCL IN DEXTROSE-NACL 20-5-0.45 MEQ/L-%-% IV SOLN: INTRAVENOUS | Status: DC

## 2017-04-21 MED ORDER — KETOROLAC TROMETHAMINE 30 MG/ML IJ SOLN
15.0000 mg | Freq: Four times a day (QID) | INTRAMUSCULAR | Status: DC
  Administered 2017-04-21 – 2017-04-22 (×3): 15 mg via INTRAVENOUS
  Filled 2017-04-21 (×3): qty 1

## 2017-04-21 MED ORDER — CEFAZOLIN SODIUM-DEXTROSE 2-4 GM/100ML-% IV SOLN
INTRAVENOUS | Status: AC
  Filled 2017-04-21: qty 100

## 2017-04-21 MED ORDER — CHLORHEXIDINE GLUCONATE CLOTH 2 % EX PADS: 6.0000 | MEDICATED_PAD | Freq: Once | CUTANEOUS | Status: DC

## 2017-04-21 MED ORDER — SODIUM CHLORIDE 0.9 % IR SOLN
Status: DC | PRN
  Administered 2017-04-21: 09:00:00

## 2017-04-21 MED ORDER — OXYCODONE HCL 5 MG PO TABS
5.0000 mg | ORAL_TABLET | Freq: Once | ORAL | Status: AC | PRN
  Administered 2017-04-21: 5 mg via ORAL

## 2017-04-21 MED ORDER — THROMBIN (RECOMBINANT) 5000 UNITS EX SOLR
CUTANEOUS | Status: AC
  Filled 2017-04-21: qty 5000

## 2017-04-21 MED ORDER — CYCLOBENZAPRINE HCL 10 MG PO TABS
ORAL_TABLET | ORAL | Status: AC
  Administered 2017-04-21: 10 mg via ORAL
  Filled 2017-04-21: qty 1

## 2017-04-21 MED ORDER — CEFAZOLIN SODIUM-DEXTROSE 2-4 GM/100ML-% IV SOLN: 2.0000 g | INTRAVENOUS | Status: DC

## 2017-04-21 MED ORDER — KETOROLAC TROMETHAMINE 30 MG/ML IJ SOLN: 15.0000 mg | Freq: Once | INTRAMUSCULAR | Status: DC

## 2017-04-21 MED ORDER — KETOROLAC TROMETHAMINE 15 MG/ML IJ SOLN
INTRAMUSCULAR | Status: AC
  Administered 2017-04-21: 15 mg
  Filled 2017-04-21: qty 1

## 2017-04-21 MED ORDER — SUGAMMADEX SODIUM 200 MG/2ML IV SOLN
INTRAVENOUS | Status: DC | PRN
  Administered 2017-04-21: 200 mg via INTRAVENOUS

## 2017-04-21 MED ORDER — BSS IO SOLN
15.0000 mL | Freq: Once | INTRAOCULAR | Status: AC
  Administered 2017-04-21: 15 mL
  Filled 2017-04-21: qty 15

## 2017-04-21 MED ORDER — MORPHINE SULFATE (PF) 4 MG/ML IV SOLN: 4.0000 mg | INTRAVENOUS | Status: DC | PRN

## 2017-04-21 MED ORDER — ACETAMINOPHEN 10 MG/ML IV SOLN
INTRAVENOUS | Status: AC
  Filled 2017-04-21: qty 100

## 2017-04-21 MED ORDER — ACETAMINOPHEN 650 MG RE SUPP
650.0000 mg | RECTAL | Status: DC | PRN
Start: 2017-04-21 — End: 2017-04-22

## 2017-04-21 MED ORDER — METHYLPREDNISOLONE ACETATE 80 MG/ML IJ SUSP
INTRAMUSCULAR | Status: AC
  Filled 2017-04-21: qty 1

## 2017-04-21 MED ORDER — 0.9 % SODIUM CHLORIDE (POUR BTL) OPTIME
TOPICAL | Status: DC | PRN
  Administered 2017-04-21: 1000 mL

## 2017-04-21 MED ORDER — FENTANYL CITRATE (PF) 100 MCG/2ML IJ SOLN
INTRAMUSCULAR | Status: DC | PRN
  Administered 2017-04-21: 100 ug via INTRAVENOUS

## 2017-04-21 MED ORDER — FENTANYL CITRATE (PF) 100 MCG/2ML IJ SOLN
INTRAMUSCULAR | Status: AC
Start: 2017-04-21 — End: 2017-04-21
  Filled 2017-04-21: qty 2

## 2017-04-21 MED ORDER — MENTHOL 3 MG MT LOZG: 1.0000 | LOZENGE | OROMUCOSAL | Status: DC | PRN

## 2017-04-21 MED ORDER — PROPOFOL 10 MG/ML IV BOLUS
INTRAVENOUS | Status: DC | PRN
  Administered 2017-04-21: 50 mg via INTRAVENOUS
  Administered 2017-04-21: 150 mg via INTRAVENOUS

## 2017-04-21 MED ORDER — CEFAZOLIN SODIUM-DEXTROSE 2-3 GM-%(50ML) IV SOLR
INTRAVENOUS | Status: DC | PRN
  Administered 2017-04-21: 2 g via INTRAVENOUS

## 2017-04-21 MED ORDER — ONDANSETRON HCL 4 MG/2ML IJ SOLN
INTRAMUSCULAR | Status: DC | PRN
  Administered 2017-04-21: 4 mg via INTRAVENOUS

## 2017-04-21 MED ORDER — LIDOCAINE-EPINEPHRINE 1 %-1:100000 IJ SOLN
INTRAMUSCULAR | Status: AC
  Filled 2017-04-21: qty 1

## 2017-04-21 MED ORDER — HYDROCODONE-ACETAMINOPHEN 5-325 MG PO TABS
1.0000 | ORAL_TABLET | ORAL | Status: DC | PRN
  Administered 2017-04-21 – 2017-04-22 (×2): 2 via ORAL
  Filled 2017-04-21: qty 1
  Filled 2017-04-21 (×3): qty 2

## 2017-04-21 MED ORDER — SODIUM CHLORIDE 0.9% FLUSH: 3.0000 mL | INTRAVENOUS | Status: DC | PRN

## 2017-04-21 MED ORDER — ACETAMINOPHEN 325 MG PO TABS: 650.0000 mg | ORAL_TABLET | ORAL | Status: DC | PRN

## 2017-04-21 MED ORDER — ACETAMINOPHEN 10 MG/ML IV SOLN
INTRAVENOUS | Status: DC | PRN
  Administered 2017-04-21: 1000 mg via INTRAVENOUS

## 2017-04-21 MED ORDER — PHENOL 1.4 % MT LIQD: 1.0000 | OROMUCOSAL | Status: DC | PRN

## 2017-04-21 MED ORDER — VITAMIN D3 25 MCG (1000 UNIT) PO TABS
1000.0000 [IU] | ORAL_TABLET | Freq: Every day | ORAL | Status: DC
  Filled 2017-04-21: qty 1

## 2017-04-21 MED ORDER — ALUM & MAG HYDROXIDE-SIMETH 200-200-20 MG/5ML PO SUSP: 30.0000 mL | Freq: Four times a day (QID) | ORAL | Status: DC | PRN

## 2017-04-21 MED ORDER — ROCURONIUM BROMIDE 100 MG/10ML IV SOLN
INTRAVENOUS | Status: DC | PRN
  Administered 2017-04-21: 20 mg via INTRAVENOUS
  Administered 2017-04-21: 60 mg via INTRAVENOUS

## 2017-04-21 MED ORDER — BISACODYL 10 MG RE SUPP: 10.0000 mg | Freq: Every day | RECTAL | Status: DC | PRN

## 2017-04-21 MED ORDER — PHENYLEPHRINE HCL 10 MG/ML IJ SOLN
INTRAVENOUS | Status: DC | PRN
  Administered 2017-04-21: 30 ug/min via INTRAVENOUS

## 2017-04-21 MED ORDER — MAGNESIUM HYDROXIDE 400 MG/5ML PO SUSP: 30.0000 mL | Freq: Every day | ORAL | Status: DC | PRN

## 2017-04-21 MED ORDER — LIDOCAINE HCL (CARDIAC) 20 MG/ML IV SOLN
INTRAVENOUS | Status: DC | PRN
  Administered 2017-04-21: 80 mg via INTRAVENOUS

## 2017-04-21 SURGICAL SUPPLY — 65 items
ADH SKN CLS APL DERMABOND .7 (GAUZE/BANDAGES/DRESSINGS) ×1
APL SKNCLS STERI-STRIP NONHPOA (GAUZE/BANDAGES/DRESSINGS)
BAG DECANTER FOR FLEXI CONT (MISCELLANEOUS) ×2 IMPLANT
BENZOIN TINCTURE PRP APPL 2/3 (GAUZE/BANDAGES/DRESSINGS) IMPLANT
BLADE CLIPPER SURG (BLADE) IMPLANT
BUR ACRON 5.0MM COATED (BURR) ×1 IMPLANT
BUR MATCHSTICK NEURO 3.0 LAGG (BURR) ×2 IMPLANT
CANISTER SUCT 3000ML PPV (MISCELLANEOUS) ×2 IMPLANT
CARTRIDGE OIL MAESTRO DRILL (MISCELLANEOUS) ×1 IMPLANT
DECANTER SPIKE VIAL GLASS SM (MISCELLANEOUS) ×2 IMPLANT
DERMABOND ADVANCED (GAUZE/BANDAGES/DRESSINGS) ×1
DERMABOND ADVANCED .7 DNX12 (GAUZE/BANDAGES/DRESSINGS) ×1 IMPLANT
DIFFUSER DRILL AIR PNEUMATIC (MISCELLANEOUS) ×2 IMPLANT
DRAPE LAPAROTOMY 100X72X124 (DRAPES) ×2 IMPLANT
DRAPE MICROSCOPE LEICA (MISCELLANEOUS) ×2 IMPLANT
DRAPE POUCH INSTRU U-SHP 10X18 (DRAPES) ×2 IMPLANT
ELECT REM PT RETURN 9FT ADLT (ELECTROSURGICAL) ×2
ELECTRODE REM PT RTRN 9FT ADLT (ELECTROSURGICAL) ×1 IMPLANT
GAUZE SPONGE 4X4 12PLY STRL (GAUZE/BANDAGES/DRESSINGS) ×1 IMPLANT
GAUZE SPONGE 4X4 16PLY XRAY LF (GAUZE/BANDAGES/DRESSINGS) IMPLANT
GLOVE BIO SURGEON STRL SZ8 (GLOVE) ×1 IMPLANT
GLOVE BIO SURGEON STRL SZ8.5 (GLOVE) ×1 IMPLANT
GLOVE BIOGEL PI IND STRL 6.5 (GLOVE) IMPLANT
GLOVE BIOGEL PI IND STRL 7.5 (GLOVE) IMPLANT
GLOVE BIOGEL PI IND STRL 8 (GLOVE) ×1 IMPLANT
GLOVE BIOGEL PI INDICATOR 6.5 (GLOVE) ×2
GLOVE BIOGEL PI INDICATOR 7.5 (GLOVE) ×1
GLOVE BIOGEL PI INDICATOR 8 (GLOVE) ×1
GLOVE ECLIPSE 7.5 STRL STRAW (GLOVE) ×2 IMPLANT
GLOVE EXAM NITRILE LRG STRL (GLOVE) IMPLANT
GLOVE EXAM NITRILE XL STR (GLOVE) IMPLANT
GLOVE EXAM NITRILE XS STR PU (GLOVE) IMPLANT
GLOVE SURG SS PI 6.5 STRL IVOR (GLOVE) ×4 IMPLANT
GOWN STRL REUS W/ TWL LRG LVL3 (GOWN DISPOSABLE) IMPLANT
GOWN STRL REUS W/ TWL XL LVL3 (GOWN DISPOSABLE) ×1 IMPLANT
GOWN STRL REUS W/TWL 2XL LVL3 (GOWN DISPOSABLE) IMPLANT
GOWN STRL REUS W/TWL LRG LVL3 (GOWN DISPOSABLE) ×4
GOWN STRL REUS W/TWL XL LVL3 (GOWN DISPOSABLE) ×4
KIT BASIN OR (CUSTOM PROCEDURE TRAY) ×2 IMPLANT
KIT ROOM TURNOVER OR (KITS) ×2 IMPLANT
NDL HYPO 18GX1.5 BLUNT FILL (NEEDLE) IMPLANT
NDL SPNL 18GX3.5 QUINCKE PK (NEEDLE) ×1 IMPLANT
NDL SPNL 22GX3.5 QUINCKE BK (NEEDLE) ×1 IMPLANT
NEEDLE HYPO 18GX1.5 BLUNT FILL (NEEDLE) ×2 IMPLANT
NEEDLE SPNL 18GX3.5 QUINCKE PK (NEEDLE) ×4 IMPLANT
NEEDLE SPNL 22GX3.5 QUINCKE BK (NEEDLE) ×2 IMPLANT
NS IRRIG 1000ML POUR BTL (IV SOLUTION) ×2 IMPLANT
OIL CARTRIDGE MAESTRO DRILL (MISCELLANEOUS) ×2
PACK LAMINECTOMY NEURO (CUSTOM PROCEDURE TRAY) ×2 IMPLANT
PAD ARMBOARD 7.5X6 YLW CONV (MISCELLANEOUS) ×6 IMPLANT
PATTIES SURGICAL .5 X1 (DISPOSABLE) ×2 IMPLANT
RUBBERBAND STERILE (MISCELLANEOUS) ×4 IMPLANT
SPONGE LAP 4X18 X RAY DECT (DISPOSABLE) IMPLANT
SPONGE SURGIFOAM ABS GEL 100 (HEMOSTASIS) ×2 IMPLANT
STRIP CLOSURE SKIN 1/2X4 (GAUZE/BANDAGES/DRESSINGS) IMPLANT
SUT PROLENE 6 0 BV (SUTURE) IMPLANT
SUT VIC AB 1 CT1 18XBRD ANBCTR (SUTURE) ×1 IMPLANT
SUT VIC AB 1 CT1 8-18 (SUTURE) ×4
SUT VIC AB 2-0 CP2 18 (SUTURE) ×3 IMPLANT
SUT VIC AB 3-0 SH 8-18 (SUTURE) ×1 IMPLANT
SYR 5ML LL (SYRINGE) ×1 IMPLANT
TAPE CLOTH SURG 4X10 WHT LF (GAUZE/BANDAGES/DRESSINGS) ×1 IMPLANT
TOWEL GREEN STERILE (TOWEL DISPOSABLE) ×2 IMPLANT
TOWEL GREEN STERILE FF (TOWEL DISPOSABLE) ×2 IMPLANT
WATER STERILE IRR 1000ML POUR (IV SOLUTION) ×2 IMPLANT

## 2017-04-21 NOTE — Progress Notes (Signed)
Vitals:   04/21/17 1230 04/21/17 1245 04/21/17 1325 04/21/17 1700  BP: 107/63 111/65 124/70 (!) 118/58  Pulse: 82 84 92 74  Resp: 18 14 18 18   Temp:  97.7 F (36.5 C) 97.8 F (36.6 C) 98.6 F (37 C)  TempSrc:      SpO2: 99% 98% 99% 95%    CBC Recent Labs    04/19/17 1034  WBC 9.2  HGB 14.4  HCT 45.6  PLT 326   BMET Recent Labs    04/19/17 1034  NA 137  K 3.7  CL 103  CO2 25  GLUCOSE 103*  BUN 15  CREATININE 0.96  CALCIUM 10.1    Patient resting comfortably in bed. She feels that neurogenic claudication is improved. Has been ambulating in the halls. Voiding well. Dressing clean and dry.  Plan: Doing well following surgery. Continue to progress through postoperative recovery.  Hosie Spangle, MD 04/21/2017, 7:24 PM

## 2017-04-21 NOTE — Op Note (Addendum)
04/21/2017  9:41 AM  PATIENT:  Felicia Hall  72 y.o. female  PRE-OPERATIVE DIAGNOSIS:  Multilevel, multifactorial lumbar stenosis with neurogenic claudication; lumbar spondylosis; lumbar degenerative disc disease  POST-OPERATIVE DIAGNOSIS:  Multilevel, multifactorial lumbar stenosis with neurogenic claudication; lumbar spondylosis; lumbar degenerative disc disease  PROCEDURE:  Procedure(s):  Bilateral L4-5 and left L5-S1 lumbar laminotomies, medial facetectomies, and foraminotomies, for decompression of the left L4, L5, and S1 and right L4 and L5 nerve roots, with microdissection, microsurgical technique, and the operating microscope  SURGEON:  Jovita Gamma, M.D.  ASSISTANTS: Newman Pies, M.D.  ANESTHESIA:   general  EBL:  Total I/O In: 600 [I.V.:600] Out: 100 [Blood:100]  BLOOD ADMINISTERED:none  COUNT: Correct per nursing staff  DICTATION: Patient was brought to the operating room placed under general endotracheal anesthesia. Patient was turned to a prone position the lumbar region was prepped with Betadine soap and solution and draped in a sterile fashion. The midline was infiltrated with local anesthetic with epinephrine. A localizing x-ray was taken and then a midline incision was made carried down thru the subcutaneous tissue, bipolar cautery and electrocautery were used to maintain hemostasis. Dissection was carried down to the lumbar fascia which was incised bilaterally and the paraspinal muscles were dissected from the spinous process and lamina in a subperiosteal fashion. Another localizing x-ray was taken and the L4-5 and L5-S1 levels were identified. The operating microscope was draped and brought in the field to provide additional magnification, illumination, and visualization, and the remainder the procedure was performed using microdissection microsurgical technique. Bilateral L4 and L5 laminotomies and left L5 and S1 laminotomies were begun with the high-speed drill  and Kerrison punches. The thickened ligamentum flavum was carefully removed. Medial facetectomies were performed using the high-speed drill and Kerrison punches. Dissection was carried laterally to decompress the lateral stenosis taking care to leave the facet complexes intact. Foraminotomies were performed for the exiting left L4, L5, and S1 and right L4 and L5 nerve roots. Once the decompression was completed hemostasis was established with the use of bipolar cautery and Gelfoam with thrombin. Once hemostasis was confirmed, we instilled 2 mL of fentanyl and 80 mg of Depo-Medrol into the epidural space. We then proceeded with closure. Paraspinal muscles, deep fascia, and Scarpa's fascia were closed in separate layers with interrupted 1 undyed Vicryl sutures. The subcutaneous and subcuticular were closed with interrupted inverted 2-0 undyed Vicryl sutures. Skin edges were approximated with Dermabond. Dressing of sterile gauze and Hypafix was applied.  PLAN OF CARE: Admit for overnight observation  PATIENT DISPOSITION:  PACU - hemodynamically stable.   Delay start of Pharmacological VTE agent (>24hrs) due to surgical blood loss or risk of bleeding:  yes

## 2017-04-21 NOTE — Anesthesia Procedure Notes (Addendum)
Procedure Name: Intubation Performed by: Audry Pili, MD Pre-anesthesia Checklist: Patient identified, Emergency Drugs available, Suction available and Patient being monitored Patient Re-evaluated:Patient Re-evaluated prior to induction Oxygen Delivery Method: Circle System Utilized Preoxygenation: Pre-oxygenation with 100% oxygen Induction Type: IV induction Ventilation: Mask ventilation without difficulty Laryngoscope Size: Mac and 3 Grade View: Grade I Tube type: Oral Tube size: 7.0 mm Number of attempts: 1 Airway Equipment and Method: Stylet and Oral airway Placement Confirmation: ETT inserted through vocal cords under direct vision,  positive ETCO2 and breath sounds checked- equal and bilateral Secured at: 22 cm Tube secured with: Tape Dental Injury: Teeth and Oropharynx as per pre-operative assessment  Comments: DL by Hillard Danker, SRNA

## 2017-04-21 NOTE — H&P (Signed)
Subjective: Patient is a 72 y.o.right-handed white female who is admitted for treatment of multilevel, multifactorial lumbar stenosis. Patient has been having increasing difficulties with neurogenic claudication, with pain from the low back rating down through the buttocks, thighs, legs and legs bilaterally, worse to the right than the left. We see significant stenosis bilaterally at L4-5 and to the left at L5-S1. The patient is admitted now for bilateral L4-5 and a left L5-S1 lumbar laminotomy, foraminotomy, and medial facetectomy.   Patient Active Problem List   Diagnosis Date Noted  . 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  . Benign essential tremor 01/03/2017  . Irritable bowel syndrome with constipation 08/09/2016  . Depression, major, single episode, moderate ([place]) 08/09/2016  . Neck pain, chronic 08/09/2016  . Spinal stenosis in cervical region 08/09/2016  . 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 Allergic rhinitis 10/17/2015  . Chronic pain syndrome 04/30/2013  . Hyperglycemia 04/30/2013  . Low back pain with right-sided sciatica 04/30/2013  . 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 Family history of colonic polyps 04/30/2013  . Sjogren's syndrome ([place]) 10/27/2012  . 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 Encounter for long-term (current) use of other medications   . Disorder of bone and cartilage, unspecified   . Allergic rhinitis due to pollen   . Cervical spondylosis without myelopathy   . 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 Benign essential hypertension   . Mixed hyperlipidemia    Past Medical History:  Diagnosis Date  . Acute upper respiratory infections of unspecified site   . Allergic rhinitis due to pollen   . Benign essential hypertension   . Cervical spondylosis without myelopathy   . Cervicalgia   . Diaphragmatic hernia without mention of obstruction or gangrene   . Disorder of bone and cartilage,  unspecified   . Diverticulosis of colon (without mention of hemorrhage)   . Encounter for long-term (current) use of other medications   . GERD (gastroesophageal reflux disease)   . History of hiatal hernia   . Hyperlipidemia LDL goal < 100   . 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 Other and unspecified hyperlipidemia   . Pain in joint, site unspecified   . Pre-diabetes   . Routine general medical examination at a health care facility   . Sjogren's syndrome ([place]) 10/27/2012  . Tear film insufficiency, unspecified   . Unspecified disorder of kidney and ureter   . Unspecified glaucoma(365.9)     Past Surgical History:  Procedure Laterality Date  . BELPHAROPTOSIS REPAIR     brow lift   bil  . CATARACT EXTRACTION W/ INTRAOCULAR LENS  IMPLANT, BILATERAL Bilateral 12/2014   Dr. Prudencio Burly  . [place]   . DILATION AND CURETTAGE OF UTERUS    . NASAL SEPTUM SURGERY      Medications Prior to Admission  Medication Sig Dispense Refill Last Dose  . brinzolamide (AZOPT) 1 % ophthalmic suspension Place 1 drop into both eyes 2 (two) times daily.   04/21/2017 at Unknown time  . calcium citrate-vitamin D (CITRACAL+D) 315-200 MG-UNIT per tablet Take 1 tablet by mouth 4 (four) times daily.   04/20/2017 at Unknown time  . cholecalciferol (VITAMIN D) 1000 UNITS tablet Take 1,000 Units by mouth daily.    04/20/2017 at Unknown time  . fenofibrate (TRICOR) 145 MG tablet TAKE 1 TABLET (145 MG TOTAL) BY MOUTH DAILY. 30 tablet 5 04/20/2017 at Unknown time  . losartan (COZAAR)  50 MG tablet TAKE 1 TABLET BY MOUTH ONCE DAILY FOR BLOOD PRESSURE (Patient taking differently: TAKE 50 MG BY MOUTH ONCE DAILY FOR BLOOD PRESSURE) 90 tablet 1 04/20/2017 at Unknown time  . naproxen sodium (ALEVE) 220 MG tablet Take 220 mg by mouth 2 (two) times daily as needed (for pain or headache).   week  . omeprazole (PRILOSEC) 20 MG capsule Take 20 mg by mouth daily.    04/21/2017 at Unknown time  .  Plant Sterols and Stanols (CHOLEST OFF PO) Take 1 tablet by mouth 3 (three) times daily.    04/20/2017 at Unknown time   Allergies  Allergen Reactions  . Statins Other (See Comments)    Muscle problems  . Allopurinol Rash  . Darvocet [Propoxyphene N-Acetaminophen] Rash    Social History   Tobacco Use  . Smoking status: Never Smoker  . Smokeless tobacco: Never Used  Substance Use Topics  . Alcohol use: Yes    Alcohol/week: 0.0 oz    Comment: occ    Family History  Problem Relation Age of Onset  . Heart attack Mother   . Breast cancer Mother   . Pancreatitis Father   . Heart disease Brother        By-pass surgery  . Lupus Cousin      Review of Systems A comprehensive review of systems was negative.  Objective: Vital signs in last 24 hours: Temp:  [97.9 F (36.6 C)] 97.9 F (36.6 C) (01/10 0628) Pulse Rate:  [82] 82 (01/10 0628) Resp:  [18] 18 (01/10 0628) BP: (163)/(82) 163/82 (01/10 0628) SpO2:  [96 %] 96 % (01/10 0628)  EXAM: Patient is well-developed well-nourished white female in no acute distress. Lungs are clear to auscultation , the patient has symmetrical respiratory excursion. Heart has a regular rate and rhythm normal S1 and S2 no murmur.   Abdomen is soft nontender nondistended bowel sounds are present. Extremity examination shows no clubbing cyanosis or edema. Motor examination shows 5 over 5 strength in the lower extremities including the iliopsoas quadriceps dorsiflexor extensor hallicus  longus and plantar flexor bilaterally. Sensation is intact to pinprick in the distal lower extremities. Reflexes are symmetrical bilaterally. No pathologic reflexes are present. Patient has a normal gait and stance.    Data Review:CBC    Component Value Date/Time   WBC 9.2 04/19/2017 1034   RBC 4.89 04/19/2017 1034   HGB 14.4 04/19/2017 1034   HCT 45.6 04/19/2017 1034   PLT 326 04/19/2017 1034   MCV 93.3 04/19/2017 1034   MCH 29.4 04/19/2017 1034   MCHC 31.6  04/19/2017 1034   RDW 13.1 04/19/2017 1034   RDW 13.9 10/30/2013 0805   LYMPHSABS 2,079 01/03/2017 1228   LYMPHSABS 2.2 10/30/2013 0805   MONOABS 0.6 06/05/2014 2053   EOSABS 360 01/03/2017 1228   EOSABS 0.7 (H) 10/30/2013 0805   BASOSABS 126 01/03/2017 1228   BASOSABS 0.1 10/30/2013 0805                          BMET    Component Value Date/Time   NA 137 04/19/2017 1034   NA 142 09/10/2015 0848   K 3.7 04/19/2017 1034   CL 103 04/19/2017 1034   CO2 25 04/19/2017 1034   GLUCOSE 103 (H) 04/19/2017 1034   BUN 15 04/19/2017 1034   BUN 16 09/10/2015 0848   CREATININE 0.96 04/19/2017 1034   CREATININE 0.87 01/03/2017 1228   CALCIUM 10.1 04/19/2017 1034  GFRNONAA 58 (L) 04/19/2017 1034   GFRNONAA 67 01/03/2017 1228   GFRAA >60 04/19/2017 1034   GFRAA 78 01/03/2017 1228     Assessment/Plan: Patient with neurogenic claudication secondary to multilevel, multifactorial lumbar stenosis wh is admitted now for decompression.  I've discussed with the patient the nature of his condition, the nature the surgical procedure, the typical length of surgery, hospital stay, and overall recuperation. We discussed limitations postoperatively. I discussed risks of surgery including risks of infection, bleeding, possibly need for transfusion, the risk of nerve root dysfunction with pain, weakness, numbness, or paresthesias, or risk of dural tear and CSF leakage and possible need for further surgery,  and the risk of anesthetic complications including myocardial infarction, stroke, pneumonia, and death. Understanding all this the patient does wish to proceed with surgery and is admitted for such.    Hosie Spangle, MD 04/21/2017 7:18 AM

## 2017-04-21 NOTE — Transfer of Care (Signed)
Immediate Anesthesia Transfer of Care Note  Patient: Felicia Hall  Procedure(s) Performed: BILATERAL LUMBAR FOUR- LUMBAR FIVE AND LEFT LUMBAR FIVE- SACRAL ONE LAMINOTOMY, MEDIAL FACETECTOMY AND FORAMINOTOMIES (N/A Back)  Patient Location: PACU  Anesthesia Type:General  Level of Consciousness: drowsy  Airway & Oxygen Therapy: Patient Spontanous Breathing and Patient connected to nasal cannula oxygen  Post-op Assessment: Report given to RN and Post -op Vital signs reviewed and stable  Post vital signs: Reviewed and stable  Last Vitals:  Vitals:   04/21/17 0628 04/21/17 0945  BP: (!) 163/82 (!) (P) 113/91  Pulse: 82 (P) 84  Resp: 18 (P) 20  Temp: 36.6 C (!) 36.4 C  SpO2: 96% (P) 98%    Last Pain:  Vitals:   04/21/17 0628  TempSrc: Oral  PainSc:       Patients Stated Pain Goal: 3 (08/65/78 4696)  Complications: No apparent anesthesia complications

## 2017-04-21 NOTE — Anesthesia Postprocedure Evaluation (Signed)
Anesthesia Post Note  Patient: Felicia Hall  Procedure(s) Performed: BILATERAL LUMBAR FOUR- LUMBAR FIVE AND LEFT LUMBAR FIVE- SACRAL ONE LAMINOTOMY, MEDIAL FACETECTOMY AND FORAMINOTOMIES (N/A Back)     Patient location during evaluation: PACU Anesthesia Type: General Level of consciousness: awake and alert Pain management: pain level controlled Vital Signs Assessment: post-procedure vital signs reviewed and stable Respiratory status: spontaneous breathing, nonlabored ventilation and respiratory function stable Cardiovascular status: blood pressure returned to baseline and stable Postop Assessment: no apparent nausea or vomiting Anesthetic complications: no Comments: Patient complaining of dry left eye in PACU. No blurred vision, no photosensitivity, but has some redness. Saline eye wash ordered. Patient states this is more or less her baseline dry eye.    Last Vitals:  Vitals:   04/21/17 1045 04/21/17 1100  BP: (!) 100/46 (!) 99/53  Pulse: 78 80  Resp: 19 15  Temp:    SpO2: 100% 100%    Last Pain:  Vitals:   04/21/17 0945  TempSrc:   PainSc: 0-No pain                 Audry Pili

## 2017-04-22 ENCOUNTER — Encounter (HOSPITAL_COMMUNITY): Payer: Self-pay | Admitting: Neurosurgery

## 2017-04-22 DIAGNOSIS — M4802 Spinal stenosis, cervical region: Secondary | ICD-10-CM | POA: Diagnosis not present

## 2017-04-22 DIAGNOSIS — K449 Diaphragmatic hernia without obstruction or gangrene: Secondary | ICD-10-CM | POA: Diagnosis not present

## 2017-04-22 DIAGNOSIS — K219 Gastro-esophageal reflux disease without esophagitis: Secondary | ICD-10-CM | POA: Diagnosis not present

## 2017-04-22 DIAGNOSIS — M1990 Unspecified osteoarthritis, unspecified site: Secondary | ICD-10-CM | POA: Diagnosis not present

## 2017-04-22 DIAGNOSIS — Z885 Allergy status to narcotic agent status: Secondary | ICD-10-CM | POA: Diagnosis not present

## 2017-04-22 DIAGNOSIS — E782 Mixed hyperlipidemia: Secondary | ICD-10-CM | POA: Diagnosis not present

## 2017-04-22 DIAGNOSIS — M47896 Other spondylosis, lumbar region: Secondary | ICD-10-CM | POA: Diagnosis not present

## 2017-04-22 DIAGNOSIS — M47812 Spondylosis without myelopathy or radiculopathy, cervical region: Secondary | ICD-10-CM | POA: Diagnosis not present

## 2017-04-22 DIAGNOSIS — H409 Unspecified glaucoma: Secondary | ICD-10-CM | POA: Diagnosis not present

## 2017-04-22 DIAGNOSIS — I10 Essential (primary) hypertension: Secondary | ICD-10-CM | POA: Diagnosis not present

## 2017-04-22 DIAGNOSIS — M5136 Other intervertebral disc degeneration, lumbar region: Secondary | ICD-10-CM | POA: Diagnosis not present

## 2017-04-22 DIAGNOSIS — M3501 Sicca syndrome with keratoconjunctivitis: Secondary | ICD-10-CM | POA: Diagnosis not present

## 2017-04-22 DIAGNOSIS — M48062 Spinal stenosis, lumbar region with neurogenic claudication: Secondary | ICD-10-CM | POA: Diagnosis not present

## 2017-04-22 DIAGNOSIS — Z888 Allergy status to other drugs, medicaments and biological substances status: Secondary | ICD-10-CM | POA: Diagnosis not present

## 2017-04-22 DIAGNOSIS — Z79899 Other long term (current) drug therapy: Secondary | ICD-10-CM | POA: Diagnosis not present

## 2017-04-22 DIAGNOSIS — G250 Essential tremor: Secondary | ICD-10-CM | POA: Diagnosis not present

## 2017-04-22 DIAGNOSIS — G894 Chronic pain syndrome: Secondary | ICD-10-CM | POA: Diagnosis not present

## 2017-04-22 DIAGNOSIS — K581 Irritable bowel syndrome with constipation: Secondary | ICD-10-CM | POA: Diagnosis not present

## 2017-04-22 DIAGNOSIS — M5441 Lumbago with sciatica, right side: Secondary | ICD-10-CM | POA: Diagnosis not present

## 2017-04-22 MED ORDER — HYDROCODONE-ACETAMINOPHEN 5-325 MG PO TABS: 1.0000 | ORAL_TABLET | ORAL | 0 refills | Status: DC | PRN

## 2017-04-22 MED ORDER — VITAMIN D 1000 UNITS PO TABS: 1000.0000 [IU] | ORAL_TABLET | Freq: Every day | ORAL | Status: DC

## 2017-04-22 NOTE — Progress Notes (Signed)
Patient alert and oriented, mae's well, voiding adequate amount of urine, swallowing without difficulty, no c/o pain at time of discharge. Patient discharged home with family. Script and discharged instructions given to patient. Patient and family stated understanding of instructions given. Patient has an appointment with Dr. Nudelman 

## 2017-04-22 NOTE — Discharge Summary (Signed)
Physician Discharge Summary  Patient ID: Felicia Hall MRN: [id number] DOB/AGE: 72-30-1947 72 y.o.  Admit date: 04/21/2017 Discharge date: 04/22/2017  Admission Diagnoses:  Multilevel, multifactorial lumbar stenosis with neurogenic claudication; lumbar spondylosis; lumbar degenerative disc disease  Discharge Diagnoses:  Multilevel, multifactorial lumbar stenosis with neurogenic claudication; lumbar spondylosis; lumbar degenerative disc disease  Active Problems:   Lumbar stenosis with neurogenic claudication   Discharged Condition: good  Hospital Course: Patient was admitted underwent bilateral L4-5 and left L5-S1 lumbar decompression of stenosis. Postoperatively she is done well. She's had excellent relief of her neurogenic claudication. She is up and ambulate actively. Her incision is healing nicely. She is voiding well. We are discharging her to home with instructions regarding wound care and activities. She is scheduled to follow-up with me in 3 weeks.  Discharge Exam: Blood pressure 135/64, pulse 71, temperature 97.7 F (36.5 C), temperature source Oral, resp. rate 18, SpO2 99 %.  Disposition: 01-Home or Self Care  Discharge Instructions    Discharge wound care:   Complete by:  As directed    Leave the wound open to air. Shower daily with the wound uncovered. Water and soapy water should run over the incision area. Do not wash directly on the incision for 2 weeks. Remove the glue after 2 weeks.   Driving Restrictions   Complete by:  As directed    No driving for 2 weeks. May ride in the car locally now. May begin to drive locally in 2 weeks.   Other Restrictions   Complete by:  As directed    Walk gradually increasing distances out in the fresh air at least twice a day. Walking additional 6 times inside the house, gradually increasing distances, daily. No bending, lifting, or twisting. Perform activities between shoulder and waist height (that is at counter height when standing  or table height when sitting).     Allergies as of 04/22/2017      Reactions   Statins Other (See Comments)   Muscle problems   Allopurinol Rash   Darvocet [propoxyphene N-acetaminophen] Rash      Medication List    TAKE these medications   brinzolamide 1 % ophthalmic suspension Commonly known as:  AZOPT Place 1 drop into both eyes 2 (two) times daily.   calcium citrate-vitamin D 315-200 MG-UNIT tablet Commonly known as:  CITRACAL+D Take 1 tablet by mouth 4 (four) times daily.   cholecalciferol 1000 units tablet Commonly known as:  VITAMIN D Take 1,000 Units by mouth daily.   CHOLEST OFF PO Take 1 tablet by mouth 3 (three) times daily.   fenofibrate 145 MG tablet Commonly known as:  TRICOR TAKE 1 TABLET (145 MG TOTAL) BY MOUTH DAILY.   HYDROcodone-acetaminophen 5-325 MG tablet Commonly known as:  NORCO/VICODIN Take 1-2 tablets by mouth every 4 (four) hours as needed (pain).   losartan 50 MG tablet Commonly known as:  COZAAR TAKE 1 TABLET BY MOUTH ONCE DAILY FOR BLOOD PRESSURE What changed:  See the new instructions.   naproxen sodium 220 MG tablet Commonly known as:  ALEVE Take 220 mg by mouth 2 (two) times daily as needed (for pain or headache).   omeprazole 20 MG capsule Commonly known as:  PRILOSEC Take 20 mg by mouth daily.            Discharge Care Instructions  (From admission, onward)        Start     Ordered   04/22/17 0000  Discharge wound care:  Comments:  Leave the wound open to air. Shower daily with the wound uncovered. Water and soapy water should run over the incision area. Do not wash directly on the incision for 2 weeks. Remove the glue after 2 weeks.   04/22/17 0842       Signed: Hosie Spangle 04/22/2017, 8:44 AM

## 2017-04-22 NOTE — Care Management Obs Status (Signed)
[facility] NOTIFICATION   Patient Details  Name: Felicia Hall MRN: [id number] Date of Birth: [birth year]   Medicare Observation Status Notification Given:  Yes    Ninfa Meeker, RN 04/22/2017, 8:19 AM

## 2017-04-25 ENCOUNTER — Telehealth: Payer: Self-pay

## 2017-04-25 NOTE — Telephone Encounter (Signed)
Transition Care Management Follow-Up Telephone Call   Date discharged and where: [facility] on 04/22/2017  How have you been since you were released from the hospital? Not much pain and no bleeding  Any patient concerns? None  Items Reviewed:   Meds: Y  Allergies: Y  Dietary Changes Reviewed: Y  Functional Questionnaire:  Independent-I Dependent-D  ADLs:   Dressing- I    Eating- I   Maintaining continence- I   Transferring- I   Transportation- D   Meal Prep- I   Managing Meds- I  Confirmed importance and Date/Time of follow-up visits scheduled: 05/16/17 with surgeon   Confirmed with patient if condition worsens to call PCP or go to the Emergency Dept. Patient was given office number and encouraged to call back with questions or concerns: Yes

## 2017-05-02 DIAGNOSIS — M9901 Segmental and somatic dysfunction of cervical region: Secondary | ICD-10-CM | POA: Diagnosis not present

## 2017-05-02 DIAGNOSIS — M9902 Segmental and somatic dysfunction of thoracic region: Secondary | ICD-10-CM | POA: Diagnosis not present

## 2017-05-02 DIAGNOSIS — M50322 Other cervical disc degeneration at C5-C6 level: Secondary | ICD-10-CM | POA: Diagnosis not present

## 2017-05-02 DIAGNOSIS — M5136 Other intervertebral disc degeneration, lumbar region: Secondary | ICD-10-CM | POA: Diagnosis not present

## 2017-05-05 ENCOUNTER — Ambulatory Visit: Payer: Medicare Other | Admitting: Internal Medicine

## 2017-05-06 DIAGNOSIS — M9901 Segmental and somatic dysfunction of cervical region: Secondary | ICD-10-CM | POA: Diagnosis not present

## 2017-05-06 DIAGNOSIS — M50322 Other cervical disc degeneration at C5-C6 level: Secondary | ICD-10-CM | POA: Diagnosis not present

## 2017-05-06 DIAGNOSIS — M9902 Segmental and somatic dysfunction of thoracic region: Secondary | ICD-10-CM | POA: Diagnosis not present

## 2017-05-06 DIAGNOSIS — M5136 Other intervertebral disc degeneration, lumbar region: Secondary | ICD-10-CM | POA: Diagnosis not present

## 2017-05-12 ENCOUNTER — Ambulatory Visit (INDEPENDENT_AMBULATORY_CARE_PROVIDER_SITE_OTHER): Payer: Medicare Other | Admitting: Internal Medicine

## 2017-05-12 ENCOUNTER — Encounter: Payer: Self-pay | Admitting: Internal Medicine

## 2017-05-12 VITALS — BP 140/82 | HR 83 | Temp 98.0°F | Ht 67.0 in | Wt 175.4 lb

## 2017-05-12 DIAGNOSIS — R198 Other specified symptoms and signs involving the digestive system and abdomen: Secondary | ICD-10-CM

## 2017-05-12 DIAGNOSIS — R739 Hyperglycemia, unspecified: Secondary | ICD-10-CM

## 2017-05-12 DIAGNOSIS — F458 Other somatoform disorders: Secondary | ICD-10-CM | POA: Diagnosis not present

## 2017-05-12 DIAGNOSIS — K449 Diaphragmatic hernia without obstruction or gangrene: Secondary | ICD-10-CM | POA: Diagnosis not present

## 2017-05-12 DIAGNOSIS — K219 Gastro-esophageal reflux disease without esophagitis: Secondary | ICD-10-CM

## 2017-05-12 DIAGNOSIS — M9902 Segmental and somatic dysfunction of thoracic region: Secondary | ICD-10-CM | POA: Diagnosis not present

## 2017-05-12 DIAGNOSIS — M5136 Other intervertebral disc degeneration, lumbar region: Secondary | ICD-10-CM | POA: Diagnosis not present

## 2017-05-12 DIAGNOSIS — M62838 Other muscle spasm: Secondary | ICD-10-CM | POA: Diagnosis not present

## 2017-05-12 DIAGNOSIS — M9901 Segmental and somatic dysfunction of cervical region: Secondary | ICD-10-CM | POA: Diagnosis not present

## 2017-05-12 DIAGNOSIS — E782 Mixed hyperlipidemia: Secondary | ICD-10-CM | POA: Diagnosis not present

## 2017-05-12 DIAGNOSIS — M50322 Other cervical disc degeneration at C5-C6 level: Secondary | ICD-10-CM | POA: Diagnosis not present

## 2017-05-12 DIAGNOSIS — R0989 Other specified symptoms and signs involving the circulatory and respiratory systems: Secondary | ICD-10-CM

## 2017-05-12 DIAGNOSIS — R09A2 Foreign body sensation, throat: Secondary | ICD-10-CM

## 2017-05-12 MED ORDER — CYCLOBENZAPRINE HCL 10 MG PO TABS
10.0000 mg | ORAL_TABLET | Freq: Every evening | ORAL | 1 refills | Status: DC | PRN
Start: 2017-05-12 — End: 2018-03-16

## 2017-05-12 NOTE — Progress Notes (Signed)
Location:  [facility] clinic Provider:  Merlyn Bollen L. Mariea Clonts, D.O., C.M.D.  Code Status: full code Goals of Care:  Advanced Directives 04/19/2017  Does Patient Have a Medical Advance Directive? Yes  Type of Advance Directive Living will  Does patient want to make changes to medical advance directive? -  Copy of [place] in Chart? -  Would patient like information on creating a medical advance directive? -   Chief Complaint  Patient presents with  . Medical Management of Chronic Issues    4 month follow-up, muscle cramps, just had back surgery 3 wks ago; stomach promblems consisting of hernia; no complaints of pain  . Medication Refill    No Refills    HPI: Patient is a 72 y.o. female seen today for medical management of chronic diseases.    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  She is mostly back to her normal activities outside of tenderness at her incision site.  Feels like it's too early to tell.  So far still having pain around her hip area and leg cramps in thighs.  Feels weak in legs first thing, but improves through the day.    Has had the tenderness on her lateral hips for years.  Had an injection in one at one point that did not help.  (sounds like cortisone for bursitis).  Now c/o hiatal hernia.  Is getting a lot more reflux.  Feels like food is stuck in the middle of her stomach.  She can massage it and get it to go down.   Just certain bulkier foods that stick.    She had a muscle relaxant while in the hospital that helped quite a bit.  Tremors occasionally get worse in her shoulders and neck.    Past Medical  History:  Diagnosis Date  . Acute upper respiratory infections of unspecified site   . Allergic rhinitis due to pollen   . Benign essential hypertension   . Cervical spondylosis without myelopathy   . Cervicalgia   . Diaphragmatic hernia without mention of obstruction or gangrene   . Disorder of bone and cartilage, unspecified   . Diverticulosis of colon (without mention of hemorrhage)   . Encounter for long-term (current) use of other medications   . GERD (gastroesophageal reflux disease)   . History of hiatal hernia   . Hyperlipidemia LDL goal < 100   . 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 Other and unspecified hyperlipidemia   . Pain in joint, site unspecified   . Pre-diabetes   . Routine general medical examination at a health care facility   . Sjogren's syndrome ([place]) 10/27/2012  . Tear film insufficiency, unspecified   . Unspecified disorder of kidney and ureter   . Unspecified glaucoma(365.9)     Past Surgical History:  Procedure Laterality Date  . BELPHAROPTOSIS REPAIR     brow lift   bil  . CATARACT EXTRACTION W/ INTRAOCULAR LENS  IMPLANT, BILATERAL Bilateral 12/2014   Dr. Prudencio Burly  . [place]   . DILATION AND CURETTAGE OF UTERUS    . LUMBAR LAMINECTOMY/DECOMPRESSION MICRODISCECTOMY N/A 04/21/2017   Procedure: BILATERAL LUMBAR FOUR- LUMBAR FIVE  AND LEFT LUMBAR FIVE- SACRAL ONE LAMINOTOMY, MEDIAL FACETECTOMY AND FORAMINOTOMIES;  Surgeon: Jovita Gamma, MD;  Location: [place];  Service: Neurosurgery;  Laterality: N/A;  . NASAL SEPTUM SURGERY      Allergies  Allergen Reactions  . Statins Other (See Comments)    Muscle problems  . Allopurinol Rash  . Darvocet [Propoxyphene N-Acetaminophen] Rash    Outpatient Encounter Medications as of 05/12/2017  Medication Sig  . brinzolamide (AZOPT) 1 % ophthalmic suspension Place 1 drop into both eyes 2 (two) times daily.  . calcium citrate-vitamin D (CITRACAL+D) 315-200 MG-UNIT per  tablet Take 1 tablet by mouth 4 (four) times daily.  . cholecalciferol (VITAMIN D) 1000 UNITS tablet Take 1,000 Units by mouth daily.   . fenofibrate (TRICOR) 145 MG tablet TAKE 1 TABLET (145 MG TOTAL) BY MOUTH DAILY.  Marland Kitchen losartan (COZAAR) 50 MG tablet TAKE 1 TABLET BY MOUTH ONCE DAILY FOR BLOOD PRESSURE (Patient taking differently: TAKE 50 MG BY MOUTH ONCE DAILY FOR BLOOD PRESSURE)  . naproxen sodium (ALEVE) 220 MG tablet Take 220 mg by mouth 2 (two) times daily as needed (for pain or headache).  Marland Kitchen omeprazole (PRILOSEC) 20 MG capsule Take 20 mg by mouth daily.   . Plant Sterols and Stanols (CHOLEST OFF PO) Take 1 tablet by mouth 3 (three) times daily.   . [DISCONTINUED] HYDROcodone-acetaminophen (NORCO/VICODIN) 5-325 MG tablet Take 1-2 tablets by mouth every 4 (four) hours as needed (pain). (Patient not taking: Reported on 05/12/2017)   No facility-administered encounter medications on file as of 05/12/2017.     Review of Systems:  Review of Systems  Constitutional: Positive for malaise/fatigue. Negative for chills and fever.  HENT: Negative for hearing loss.   Eyes:       Dry eyes and mouth  Respiratory: Negative for shortness of breath.   Cardiovascular: Negative for chest pain, palpitations and leg swelling.  Gastrointestinal: Positive for abdominal pain and heartburn. Negative for blood in stool, constipation, diarrhea, melena, nausea and vomiting.  Genitourinary: Negative for dysuria.  Musculoskeletal: Negative for joint pain.       Back incision tender, but back itself not tender  Skin: Negative for itching and rash.  Neurological: Negative for dizziness and weakness.  Psychiatric/Behavioral: Negative for depression and memory loss. The patient is not nervous/anxious and does not have insomnia.        Spirits better    Health Maintenance  Topic Date Due  . Hepatitis C Screening  06[birth year]  . TETANUS/TDAP  04/13/2015  . PNA vac Low Risk Adult (2 of 2 - PPSV23) 09/14/2015  .  MAMMOGRAM  04/16/2018  . Fecal DNA (Cologuard)  02/29/2020  . INFLUENZA VACCINE  Completed  . DEXA SCAN  Completed    Physical Exam: Vitals:   05/12/17 1425  BP: 140/82  Pulse: 83  Temp: 98 F (36.7 C)  TempSrc: Oral  SpO2: 96%  Weight: 175 lb 6.4 oz (79.6 kg)  Height: 5\' 7"  (1.702 m)   Body mass index is 27.47 kg/m. Physical Exam  Constitutional: She is oriented to person, place, and time. She appears well-developed and well-nourished.  Cardiovascular: Normal rate, regular rhythm, normal heart sounds and intact distal pulses.  Pulmonary/Chest: Effort normal and breath sounds normal. No respiratory distress.  Abdominal: Soft. Bowel sounds are normal. She exhibits no distension and no mass. There is tenderness. There is no rebound and no guarding.  In epigastrium  Musculoskeletal: Normal range of motion.  Neurological: She is alert and oriented to person,  place, and time.  Skin: Skin is warm and dry. There is pallor.  Incision site clean and dry  Psychiatric: She has a normal mood and affect.  Spirits improved since pain better    Labs reviewed: Basic Metabolic Panel: Recent Labs    08/06/16 0825 01/03/17 1228 04/19/17 1034  NA 140 136 137  K 4.5 4.3 3.7  CL 106 104 103  CO2 27 23 25   GLUCOSE 127* 114 103*  BUN 15 13 15   CREATININE 1.02* 0.87 0.96  CALCIUM 9.7 10.0 10.1   Liver Function Tests: Recent Labs    05/23/16 0118 01/03/17 1228  AST 29 24  ALT 26 27  ALKPHOS 48  --   BILITOT 0.8 0.4  PROT 7.6 6.9  ALBUMIN 4.5  --    Recent Labs    05/23/16 0118  LIPASE 18   No results for input(s): AMMONIA in the last 8760 hours. CBC: Recent Labs    05/23/16 0118 01/03/17 1228 04/19/17 1034  WBC 16.3* 9.0 9.2  NEUTROABS  --  5,715  --   HGB 14.6 14.4 14.4  HCT 43.7 42.8 45.6  MCV 91.0 89.7 93.3  PLT 277 325 326   Lipid Panel: Recent Labs    08/06/16 0825  CHOL 177  HDL 30*  LDLCALC 111*  TRIG 178*  CHOLHDL 5.9*   Lab Results  Component  Value Date   HGBA1C 6.3 (H) 08/06/2016    Procedures since last visit: Dg Lumbar Spine 2-3 Views  Result Date: 04/21/2017 CLINICAL DATA:  Intraoperative localization for micro diskectomy EXAM: LUMBAR SPINE - 2-3 VIEW COMPARISON:  03/15/2017 FINDINGS: Lateral radiograph of the lumbar spine reveals a needle within the posterior soft tissues at L4-5. Subsequent lateral film obtained show surgical retractors with instruments at the L5-S1 level. The numbering nomenclature is similar to that utilized on prior exam. IMPRESSION: Intraoperative localization at L4-5 and L5-S1 as described. Electronically Signed   By: [name] M.D.   On: 04/21/2017 10:27   Assessment/Plan 1. Muscle spasms of both lower extremities -requests the muscle relaxer she had in the hospital b/c it worked great, but she also was on a lot of opioids -will give her the muscle relaxer for hs only, not to drive when taking - cyclobenzaprine (FLEXERIL) 10 MG tablet; Take 1 tablet (10 mg total) by mouth at bedtime as needed for muscle spasms.  Dispense: 30 tablet; Refill: 1  2. Globus sensation -not in throat, but in lower esophagus it seems to me by where she points, will check esophagram to see what hiatal hernia is looking like - COMPLETE METABOLIC PANEL WITH GFR; Future - DG Esophagus W/Water Sol CM; Future  3. Hiatal hernia - see #2, using prilosec sometimes bid - COMPLETE METABOLIC PANEL WITH GFR; Future - DG Esophagus W/Water Sol CM; Future  4. Gastroesophageal reflux disease, esophagitis presence not specified - cont prilosec, dietary changes have been reviewed - DG Esophagus W/Water Sol CM; Future  5. Mixed hyperlipidemia - cont fenofibrate  - Lipid panel; Future  6. Hyperglycemia - cont to work on diet and walk more - CBC with Differential/Platelet; Future - COMPLETE METABOLIC PANEL WITH GFR; Future - Hemoglobin A1c; Future  Labs/tests ordered:   Orders Placed This Encounter  Procedures  . DG Esophagus  W/Water Sol CM    Standing Status:   Future    Standing Expiration Date:   07/11/2018    Order Specific Question:   Reason for Exam (SYMPTOM  OR DIAGNOSIS  REQUIRED)    Answer:   hiatal hernia, sensation in her epigastrium that food is stuck    Order Specific Question:   Preferred imaging location?    Answer:   [facility]    Order Specific Question:   Radiology Contrast Protocol - do NOT remove file path    Answer:   [email or web address]  . CBC with Differential/Platelet    Standing Status:   Future    Standing Expiration Date:   01/09/2018  . COMPLETE METABOLIC PANEL WITH GFR    Standing Status:   Future    Standing Expiration Date:   01/09/2018  . Hemoglobin A1c    Standing Status:   Future    Standing Expiration Date:   01/09/2018  . Lipid panel    Standing Status:   Future    Standing Expiration Date:   01/09/2018   Next appt:  09/12/2017  Nixxon Faria L. Aubrey Blackard, D.O. [facility] [address], [place] [address] Cell Phone (Mon-Fri 8am-5pm):  [phone] On Call:  [phone] & follow prompts after 5pm & weekends Office Phone:  [phone] Office Fax:  [phone]

## 2017-05-13 DIAGNOSIS — H401113 Primary open-angle glaucoma, right eye, severe stage: Secondary | ICD-10-CM | POA: Diagnosis not present

## 2017-05-13 DIAGNOSIS — H04122 Dry eye syndrome of left lacrimal gland: Secondary | ICD-10-CM | POA: Diagnosis not present

## 2017-05-16 ENCOUNTER — Other Ambulatory Visit: Payer: Self-pay | Admitting: *Deleted

## 2017-05-16 MED ORDER — FENOFIBRATE 145 MG PO TABS: ORAL_TABLET | ORAL | 1 refills | Status: DC

## 2017-05-16 NOTE — Telephone Encounter (Signed)
[facility] 

## 2017-05-26 DIAGNOSIS — H04122 Dry eye syndrome of left lacrimal gland: Secondary | ICD-10-CM | POA: Diagnosis not present

## 2017-05-31 DIAGNOSIS — M9901 Segmental and somatic dysfunction of cervical region: Secondary | ICD-10-CM | POA: Diagnosis not present

## 2017-05-31 DIAGNOSIS — M5136 Other intervertebral disc degeneration, lumbar region: Secondary | ICD-10-CM | POA: Diagnosis not present

## 2017-05-31 DIAGNOSIS — M9902 Segmental and somatic dysfunction of thoracic region: Secondary | ICD-10-CM | POA: Diagnosis not present

## 2017-05-31 DIAGNOSIS — M50322 Other cervical disc degeneration at C5-C6 level: Secondary | ICD-10-CM | POA: Diagnosis not present

## 2017-06-08 DIAGNOSIS — Z1231 Encounter for screening mammogram for malignant neoplasm of breast: Secondary | ICD-10-CM | POA: Diagnosis not present

## 2017-06-08 DIAGNOSIS — M810 Age-related osteoporosis without current pathological fracture: Secondary | ICD-10-CM | POA: Diagnosis not present

## 2017-06-08 DIAGNOSIS — Z124 Encounter for screening for malignant neoplasm of cervix: Secondary | ICD-10-CM | POA: Diagnosis not present

## 2017-06-08 LAB — HM MAMMOGRAPHY

## 2017-06-09 ENCOUNTER — Ambulatory Visit
Admission: RE | Admit: 2017-06-09 | Discharge: 2017-06-09 | Disposition: A | Discharge status: Home / Self Care | Payer: Medicare Other | Source: Ambulatory Visit | Attending: Internal Medicine | Admitting: Internal Medicine

## 2017-06-09 DIAGNOSIS — K219 Gastro-esophageal reflux disease without esophagitis: Secondary | ICD-10-CM

## 2017-06-09 DIAGNOSIS — R09A2 Foreign body sensation, throat: Secondary | ICD-10-CM

## 2017-06-09 DIAGNOSIS — K449 Diaphragmatic hernia without obstruction or gangrene: Secondary | ICD-10-CM

## 2017-06-09 DIAGNOSIS — R0989 Other specified symptoms and signs involving the circulatory and respiratory systems: Secondary | ICD-10-CM

## 2017-06-15 DIAGNOSIS — M50322 Other cervical disc degeneration at C5-C6 level: Secondary | ICD-10-CM | POA: Diagnosis not present

## 2017-06-15 DIAGNOSIS — M9901 Segmental and somatic dysfunction of cervical region: Secondary | ICD-10-CM | POA: Diagnosis not present

## 2017-06-15 DIAGNOSIS — M9902 Segmental and somatic dysfunction of thoracic region: Secondary | ICD-10-CM | POA: Diagnosis not present

## 2017-06-15 DIAGNOSIS — M5136 Other intervertebral disc degeneration, lumbar region: Secondary | ICD-10-CM | POA: Diagnosis not present

## 2017-06-23 DIAGNOSIS — M85852 Other specified disorders of bone density and structure, left thigh: Secondary | ICD-10-CM | POA: Diagnosis not present

## 2017-06-23 LAB — HM DEXA SCAN

## 2017-06-27 ENCOUNTER — Encounter: Payer: Self-pay | Admitting: *Deleted

## 2017-06-27 DIAGNOSIS — M50322 Other cervical disc degeneration at C5-C6 level: Secondary | ICD-10-CM | POA: Diagnosis not present

## 2017-06-27 DIAGNOSIS — M9901 Segmental and somatic dysfunction of cervical region: Secondary | ICD-10-CM | POA: Diagnosis not present

## 2017-06-27 DIAGNOSIS — M5136 Other intervertebral disc degeneration, lumbar region: Secondary | ICD-10-CM | POA: Diagnosis not present

## 2017-06-27 DIAGNOSIS — M9902 Segmental and somatic dysfunction of thoracic region: Secondary | ICD-10-CM | POA: Diagnosis not present

## 2017-07-04 ENCOUNTER — Telehealth: Payer: Self-pay | Admitting: Internal Medicine

## 2017-07-04 NOTE — Telephone Encounter (Signed)
Left msg asking pt to confirm AWV-S with nurse on 09/12/17 before CPE. Last AWV 08/06/16. VDM (DD)

## 2017-07-12 DIAGNOSIS — M9903 Segmental and somatic dysfunction of lumbar region: Secondary | ICD-10-CM | POA: Diagnosis not present

## 2017-07-12 DIAGNOSIS — M5136 Other intervertebral disc degeneration, lumbar region: Secondary | ICD-10-CM | POA: Diagnosis not present

## 2017-07-12 DIAGNOSIS — M9902 Segmental and somatic dysfunction of thoracic region: Secondary | ICD-10-CM | POA: Diagnosis not present

## 2017-07-12 DIAGNOSIS — M5134 Other intervertebral disc degeneration, thoracic region: Secondary | ICD-10-CM | POA: Diagnosis not present

## 2017-07-15 DIAGNOSIS — Z9889 Other specified postprocedural states: Secondary | ICD-10-CM | POA: Diagnosis not present

## 2017-07-22 DIAGNOSIS — M5136 Other intervertebral disc degeneration, lumbar region: Secondary | ICD-10-CM | POA: Diagnosis not present

## 2017-07-22 DIAGNOSIS — M5134 Other intervertebral disc degeneration, thoracic region: Secondary | ICD-10-CM | POA: Diagnosis not present

## 2017-07-22 DIAGNOSIS — M9902 Segmental and somatic dysfunction of thoracic region: Secondary | ICD-10-CM | POA: Diagnosis not present

## 2017-07-22 DIAGNOSIS — M9901 Segmental and somatic dysfunction of cervical region: Secondary | ICD-10-CM | POA: Diagnosis not present

## 2017-08-03 DIAGNOSIS — M9901 Segmental and somatic dysfunction of cervical region: Secondary | ICD-10-CM | POA: Diagnosis not present

## 2017-08-03 DIAGNOSIS — M5134 Other intervertebral disc degeneration, thoracic region: Secondary | ICD-10-CM | POA: Diagnosis not present

## 2017-08-03 DIAGNOSIS — M9902 Segmental and somatic dysfunction of thoracic region: Secondary | ICD-10-CM | POA: Diagnosis not present

## 2017-08-03 DIAGNOSIS — M5136 Other intervertebral disc degeneration, lumbar region: Secondary | ICD-10-CM | POA: Diagnosis not present

## 2017-08-15 DIAGNOSIS — M5414 Radiculopathy, thoracic region: Secondary | ICD-10-CM | POA: Diagnosis not present

## 2017-08-15 DIAGNOSIS — M5030 Other cervical disc degeneration, unspecified cervical region: Secondary | ICD-10-CM | POA: Diagnosis not present

## 2017-08-15 DIAGNOSIS — M4722 Other spondylosis with radiculopathy, cervical region: Secondary | ICD-10-CM | POA: Diagnosis not present

## 2017-08-15 DIAGNOSIS — M546 Pain in thoracic spine: Secondary | ICD-10-CM | POA: Diagnosis not present

## 2017-08-15 DIAGNOSIS — M5412 Radiculopathy, cervical region: Secondary | ICD-10-CM | POA: Diagnosis not present

## 2017-08-17 DIAGNOSIS — X32XXXD Exposure to sunlight, subsequent encounter: Secondary | ICD-10-CM | POA: Diagnosis not present

## 2017-08-17 DIAGNOSIS — L570 Actinic keratosis: Secondary | ICD-10-CM | POA: Diagnosis not present

## 2017-08-18 DIAGNOSIS — M9901 Segmental and somatic dysfunction of cervical region: Secondary | ICD-10-CM | POA: Diagnosis not present

## 2017-08-18 DIAGNOSIS — M5136 Other intervertebral disc degeneration, lumbar region: Secondary | ICD-10-CM | POA: Diagnosis not present

## 2017-08-18 DIAGNOSIS — M5134 Other intervertebral disc degeneration, thoracic region: Secondary | ICD-10-CM | POA: Diagnosis not present

## 2017-08-18 DIAGNOSIS — M9902 Segmental and somatic dysfunction of thoracic region: Secondary | ICD-10-CM | POA: Diagnosis not present

## 2017-08-23 DIAGNOSIS — M542 Cervicalgia: Secondary | ICD-10-CM | POA: Diagnosis not present

## 2017-08-23 DIAGNOSIS — M47814 Spondylosis without myelopathy or radiculopathy, thoracic region: Secondary | ICD-10-CM | POA: Diagnosis not present

## 2017-08-23 DIAGNOSIS — M5412 Radiculopathy, cervical region: Secondary | ICD-10-CM | POA: Diagnosis not present

## 2017-08-23 DIAGNOSIS — M5414 Radiculopathy, thoracic region: Secondary | ICD-10-CM | POA: Diagnosis not present

## 2017-08-24 DIAGNOSIS — M9901 Segmental and somatic dysfunction of cervical region: Secondary | ICD-10-CM | POA: Diagnosis not present

## 2017-08-24 DIAGNOSIS — M5134 Other intervertebral disc degeneration, thoracic region: Secondary | ICD-10-CM | POA: Diagnosis not present

## 2017-08-24 DIAGNOSIS — M9902 Segmental and somatic dysfunction of thoracic region: Secondary | ICD-10-CM | POA: Diagnosis not present

## 2017-08-24 DIAGNOSIS — M5136 Other intervertebral disc degeneration, lumbar region: Secondary | ICD-10-CM | POA: Diagnosis not present

## 2017-09-02 DIAGNOSIS — M5414 Radiculopathy, thoracic region: Secondary | ICD-10-CM | POA: Diagnosis not present

## 2017-09-02 DIAGNOSIS — M4722 Other spondylosis with radiculopathy, cervical region: Secondary | ICD-10-CM | POA: Diagnosis not present

## 2017-09-02 DIAGNOSIS — M5124 Other intervertebral disc displacement, thoracic region: Secondary | ICD-10-CM | POA: Diagnosis not present

## 2017-09-02 DIAGNOSIS — M5030 Other cervical disc degeneration, unspecified cervical region: Secondary | ICD-10-CM | POA: Diagnosis not present

## 2017-09-02 DIAGNOSIS — M542 Cervicalgia: Secondary | ICD-10-CM | POA: Diagnosis not present

## 2017-09-06 DIAGNOSIS — M9902 Segmental and somatic dysfunction of thoracic region: Secondary | ICD-10-CM | POA: Diagnosis not present

## 2017-09-06 DIAGNOSIS — M5134 Other intervertebral disc degeneration, thoracic region: Secondary | ICD-10-CM | POA: Diagnosis not present

## 2017-09-06 DIAGNOSIS — M9901 Segmental and somatic dysfunction of cervical region: Secondary | ICD-10-CM | POA: Diagnosis not present

## 2017-09-06 DIAGNOSIS — M5136 Other intervertebral disc degeneration, lumbar region: Secondary | ICD-10-CM | POA: Diagnosis not present

## 2017-09-08 ENCOUNTER — Other Ambulatory Visit: Payer: Medicare Other

## 2017-09-08 DIAGNOSIS — E782 Mixed hyperlipidemia: Secondary | ICD-10-CM

## 2017-09-08 DIAGNOSIS — R0989 Other specified symptoms and signs involving the circulatory and respiratory systems: Secondary | ICD-10-CM

## 2017-09-08 DIAGNOSIS — R739 Hyperglycemia, unspecified: Secondary | ICD-10-CM

## 2017-09-08 DIAGNOSIS — R09A2 Foreign body sensation, throat: Secondary | ICD-10-CM

## 2017-09-08 DIAGNOSIS — K449 Diaphragmatic hernia without obstruction or gangrene: Secondary | ICD-10-CM | POA: Diagnosis not present

## 2017-09-09 LAB — COMPLETE METABOLIC PANEL WITH GFR
AG Ratio: 1.9 (calc) (ref 1.0–2.5)
ALT: 27 U/L (ref 6–29)
AST: 25 U/L (ref 10–35)
Albumin: 4.5 g/dL (ref 3.6–5.1)
Alkaline phosphatase (APISO): 48 U/L (ref 33–130)
BUN: 11 mg/dL (ref 7–25)
CO2: 24 mmol/L (ref 20–32)
Calcium: 9.9 mg/dL (ref 8.6–10.4)
Chloride: 104 mmol/L (ref 98–110)
Creat: 0.88 mg/dL (ref 0.60–0.93)
GFR, Est African American: 77 mL/min/{1.73_m2} (ref >=60)
GFR, Est Non African American: 66 mL/min/{1.73_m2} (ref >=60)
Globulin: 2.4 g/dL (calc) (ref 1.9–3.7)
Glucose, Bld: 120 mg/dL — ABNORMAL HIGH (ref 65–99)
Potassium: 4.4 mmol/L (ref 3.5–5.3)
Sodium: 138 mmol/L (ref 135–146)
Total Bilirubin: 0.6 mg/dL (ref 0.2–1.2)
Total Protein: 6.9 g/dL (ref 6.1–8.1)

## 2017-09-09 LAB — CBC WITH DIFFERENTIAL/PLATELET
Basophils Absolute: 98 cells/uL (ref 0–200)
Basophils Relative: 1.4 %
Eosinophils Absolute: 399 cells/uL (ref 15–500)
Eosinophils Relative: 5.7 %
HCT: 42.7 % (ref 35.0–45.0)
Hemoglobin: 14.5 g/dL (ref 11.7–15.5)
Lymphs Abs: 2093 cells/uL (ref 850–3900)
MCH: 30.3 pg (ref 27.0–33.0)
MCHC: 34 g/dL (ref 32.0–36.0)
MCV: 89.1 fL (ref 80.0–100.0)
MPV: 10.5 fL (ref 7.5–12.5)
Monocytes Relative: 7.9 %
Neutro Abs: 3857 cells/uL (ref 1500–7800)
Neutrophils Relative %: 55.1 %
Platelets: 307 10*3/uL (ref 140–400)
RBC: 4.79 10*6/uL (ref 3.80–5.10)
RDW: 12.3 % (ref 11.0–15.0)
Total Lymphocyte: 29.9 %
WBC mixed population: 553 cells/uL (ref 200–950)
WBC: 7 10*3/uL (ref 3.8–10.8)

## 2017-09-09 LAB — LIPID PANEL
Cholesterol: 158 mg/dL (ref <200)
HDL: 37 mg/dL — ABNORMAL LOW (ref >50)
LDL Cholesterol (Calc): 100 mg/dL (calc) — ABNORMAL HIGH
Non-HDL Cholesterol (Calc): 121 mg/dL (calc) (ref <130)
Total CHOL/HDL Ratio: 4.3 (calc) (ref <5.0)
Triglycerides: 109 mg/dL (ref <150)

## 2017-09-09 LAB — HEMOGLOBIN A1C
Hgb A1c MFr Bld: 6.4 % of total Hgb — ABNORMAL HIGH (ref <5.7)
Mean Plasma Glucose: 137 (calc)
eAG (mmol/L): 7.6 (calc)

## 2017-09-12 ENCOUNTER — Encounter: Payer: Self-pay | Admitting: Internal Medicine

## 2017-09-12 ENCOUNTER — Ambulatory Visit (INDEPENDENT_AMBULATORY_CARE_PROVIDER_SITE_OTHER): Payer: Medicare Other | Admitting: Internal Medicine

## 2017-09-12 ENCOUNTER — Ambulatory Visit (INDEPENDENT_AMBULATORY_CARE_PROVIDER_SITE_OTHER): Payer: Medicare Other

## 2017-09-12 VITALS — BP 122/60 | HR 79 | Temp 98.1°F | Ht 67.0 in | Wt 169.0 lb

## 2017-09-12 DIAGNOSIS — F321 Major depressive disorder, single episode, moderate: Secondary | ICD-10-CM

## 2017-09-12 DIAGNOSIS — Z23 Encounter for immunization: Secondary | ICD-10-CM | POA: Diagnosis not present

## 2017-09-12 DIAGNOSIS — Z0000 Encounter for general adult medical examination without abnormal findings: Secondary | ICD-10-CM | POA: Diagnosis not present

## 2017-09-12 DIAGNOSIS — E782 Mixed hyperlipidemia: Secondary | ICD-10-CM | POA: Diagnosis not present

## 2017-09-12 DIAGNOSIS — M5030 Other cervical disc degeneration, unspecified cervical region: Secondary | ICD-10-CM | POA: Diagnosis not present

## 2017-09-12 DIAGNOSIS — I10 Essential (primary) hypertension: Secondary | ICD-10-CM

## 2017-09-12 DIAGNOSIS — M3501 Sicca syndrome with keratoconjunctivitis: Secondary | ICD-10-CM | POA: Diagnosis not present

## 2017-09-12 DIAGNOSIS — M5124 Other intervertebral disc displacement, thoracic region: Secondary | ICD-10-CM | POA: Diagnosis not present

## 2017-09-12 MED ORDER — ZOSTER VAC RECOMB ADJUVANTED 50 MCG/0.5ML IM SUSR: 0.5000 mL | Freq: Once | INTRAMUSCULAR | 1 refills | Status: AC

## 2017-09-12 NOTE — Progress Notes (Signed)
Provider:  Rexene Edison. Mariea Clonts, D.O., C.M.D. Location:   [place]   Place of Service:   clinic  Previous PCP: Gayland Curry, DO Patient Care Team: Gayland Curry, DO as PCP - General (Geriatric Medicine)  Extended Emergency Contact Information Primary Emergency Contact: Loeza,Thomas Address: [place], [place] Phone: [phone] Relation: Brother  Code Status: full code Goals of Care: Advanced Directive information Advanced Directives 09/12/2017  Does Patient Have a Medical Advance Directive? No  Type of Advance Directive -  Does patient want to make changes to medical advance directive? -  Copy of [place] in Chart? -  Would patient like information on creating a medical advance directive? Yes (MAU/Ambulatory/Procedural Areas - Information given)    Chief Complaint  Patient presents with  . Annual Exam    CPE    HPI: Patient is a 71 y.o. female seen today for an annual physical exam.  MMSE 30/30.     Got pneumovax with Clarise Cruz, RN today.    EKG was performed today--NSR at 86 bpm, no acute ischemia or infarct.  She asked Dr. Sherwood Gambler to check out her neck (more cervical DDD) and also between her shoulder blades has herniated disc.  It's apparently slight.  He did not want to intervene with the thoracic spine.  He recommended a neck fusion.  Someone she knew did not have a problem from it.  Both neck and shoulder blade pain come and go.  Neck is stiff and goes across her shoulders and central upper back. Sometimes thoracic spine not bad, other times muscles get tight and go into spasm.  She did fall off a speeding horse in her 69s.  This last visit was a couple of weeks ago.  She's been trying to mend her evil ways with her snacks.  She's given up corn--had been on corn chips and popcorn--midriff bulge improved.  Has given up a lot of other carbohydrates, also.  Weight down 6 lbs also.  She does not feel much  better from this perspective.  She does plan to keep plugging on.    Reviewed labs with her.   Sjogren's unchanged.    Depression--goes up and down with everything else.  Her 72 yo female cat has kidney disease and has begun to urinate on the floor.  She is not going to get more pets b/c she says she is getting too old to deal with animals with the expense of pet bills.    Reviewed bone density and recommended vitamin D supplementation and weightbearing exercise.  Past Medical History:  Diagnosis Date  . Acute upper respiratory infections of unspecified site   . Allergic rhinitis due to pollen   . Benign essential hypertension   . Cervical spondylosis without myelopathy   . Cervicalgia   . Diaphragmatic hernia without mention of obstruction or gangrene   . Disorder of bone and cartilage, unspecified   . Diverticulosis of colon (without mention of hemorrhage)   . Encounter for long-term (current) use of other medications   . GERD (gastroesophageal reflux disease)   . History of hiatal hernia   . Hyperlipidemia LDL goal < 100   . 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 Other and unspecified hyperlipidemia   . Pain in joint, site unspecified   . Pre-diabetes   . Routine general medical examination at a health care facility   .  Sjogren's syndrome ([place]) 10/27/2012  . Tear film insufficiency, unspecified   . Unspecified disorder of kidney and ureter   . Unspecified glaucoma(365.9)    Past Surgical History:  Procedure Laterality Date  . BELPHAROPTOSIS REPAIR     brow lift   bil  . CATARACT EXTRACTION W/ INTRAOCULAR LENS  IMPLANT, BILATERAL Bilateral 12/2014   Dr. Prudencio Burly  . [place]   . DILATION AND CURETTAGE OF UTERUS    . LUMBAR LAMINECTOMY/DECOMPRESSION MICRODISCECTOMY N/A 04/21/2017   Procedure: BILATERAL LUMBAR FOUR- LUMBAR FIVE AND LEFT LUMBAR FIVE- SACRAL ONE LAMINOTOMY, MEDIAL FACETECTOMY AND FORAMINOTOMIES;  Surgeon: Jovita Gamma, MD;  Location: [facility];  Service: Neurosurgery;  Laterality: N/A;  . NASAL SEPTUM SURGERY      reports that she has never smoked. She has never used smokeless tobacco. She reports that she drinks alcohol. She reports that she does not use drugs.  Functional Status Survey:    Family History  Problem Relation Age of Onset  . Heart attack Mother   . Breast cancer Mother   . Pancreatitis Father   . Heart disease Brother        By-pass surgery  . Lupus Cousin     Health Maintenance  Topic Date Due  . Hepatitis C Screening  January 24, 1946  . TETANUS/TDAP  09/13/2019 (Originally 04/13/2015)  . INFLUENZA VACCINE  11/10/2017  . MAMMOGRAM  04/16/2018  . Fecal DNA (Cologuard)  02/29/2020  . DEXA SCAN  Completed  . PNA vac Low Risk Adult  Completed    Allergies  Allergen Reactions  . Statins Other (See Comments)    Muscle problems  . Allopurinol Rash  . Darvocet [Propoxyphene N-Acetaminophen] Rash    Outpatient Encounter Medications as of 09/12/2017  Medication Sig  . b complex vitamins tablet Take 1 tablet by mouth daily.  . brinzolamide (AZOPT) 1 % ophthalmic suspension Place 1 drop into both eyes 2 (two) times daily.  . calcium citrate-vitamin D (CITRACAL+D) 315-200 MG-UNIT per tablet Take 1 tablet by mouth 4 (four) times daily.  . cholecalciferol (VITAMIN D) 1000 UNITS tablet Take 1,000 Units by mouth daily.   . cyclobenzaprine (FLEXERIL) 10 MG tablet Take 1 tablet (10 mg total) by mouth at bedtime as needed for muscle spasms.  . fenofibrate (TRICOR) 145 MG tablet Take one tablet by mouth once daily  . losartan (COZAAR) 50 MG tablet TAKE 1 TABLET BY MOUTH ONCE DAILY FOR BLOOD PRESSURE  . Omega-3 Fatty Acids (FISH OIL) 1200 MG CAPS Take 1 capsule by mouth.  Marland Kitchen omeprazole (PRILOSEC) 20 MG capsule Take 20 mg by mouth daily.   . Plant Sterols and Stanols (CHOLEST OFF PO) Take 1 tablet by mouth 3 (three) times daily.   Marland Kitchen Zoster Vaccine Adjuvanted [facility]) injection Inject 0.5 mLs  into the muscle once for 1 dose.   No facility-administered encounter medications on file as of 09/12/2017.     Review of Systems  Constitutional: Negative for chills, fever and malaise/fatigue.  HENT: Negative for congestion.        Dry mouth  Eyes: Negative for pain, discharge and redness.       Dry eyes, dry mouth  Respiratory: Negative for cough and shortness of breath.   Cardiovascular: Negative for chest pain, palpitations and leg swelling.  Gastrointestinal: Positive for constipation. Negative for abdominal pain, blood in stool, diarrhea and melena.       Goes 2-3 days w/o bm, then goes 3-4 times in one day  Genitourinary: Negative for dysuria.  Musculoskeletal: Positive for back pain, myalgias and neck pain. Negative for falls and joint pain.  Skin: Negative for itching and rash.  Neurological: Negative for dizziness and loss of consciousness.  Endo/Heme/Allergies: Does not bruise/bleed easily.  Psychiatric/Behavioral: Positive for depression. Negative for memory loss. The patient is not nervous/anxious and does not have insomnia.     Vitals:   09/12/17 1329  BP: 122/60  Pulse: 79  Temp: 98.1 F (36.7 C)  TempSrc: Oral  SpO2: 98%  Weight: 169 lb (76.7 kg)  Height: 5\' 7"  (1.702 m)   Body mass index is 26.47 kg/m. Physical Exam  Constitutional: She is oriented to person, place, and time. She appears well-developed and well-nourished. No distress.  HENT:  Head: Normocephalic and atraumatic.  Right Ear: External ear normal.  Left Ear: External ear normal.  Nose: Nose normal.  Mouth/Throat: Oropharynx is clear and moist. No oropharyngeal exudate.  Increased cerumen right ear  Eyes: Pupils are equal, round, and reactive to light. Conjunctivae and EOM are normal.  Neck: Normal range of motion. Neck supple. No JVD present. No tracheal deviation present. No thyromegaly present.  Cardiovascular: Normal rate, regular rhythm and intact distal pulses.  Midsystolic click    Pulmonary/Chest: Effort normal and breath sounds normal. No respiratory distress.  Abdominal: Soft. Bowel sounds are normal. She exhibits no distension and no mass. There is no tenderness. There is no rebound and no guarding.  Musculoskeletal: Normal range of motion.  Lymphadenopathy:    She has no cervical adenopathy.  Neurological: She is alert and oriented to person, place, and time. She displays normal reflexes. No cranial nerve deficit. She exhibits normal muscle tone.  Skin: Skin is warm and dry. Capillary refill takes less than 2 seconds. There is pallor.  Psychiatric: Her behavior is normal. Judgment and thought content normal.  Flat affect    Labs reviewed: Basic Metabolic Panel: Recent Labs    01/03/17 1228 04/19/17 1034 09/08/17 0828  NA 136 137 138  K 4.3 3.7 4.4  CL 104 103 104  CO2 23 25 24   GLUCOSE 114 103* 120*  BUN 13 15 11   CREATININE 0.87 0.96 0.88  CALCIUM 10.0 10.1 9.9   Liver Function Tests: Recent Labs    01/03/17 1228 09/08/17 0828  AST 24 25  ALT 27 27  BILITOT 0.4 0.6  PROT 6.9 6.9   No results for input(s): LIPASE, AMYLASE in the last 8760 hours. No results for input(s): AMMONIA in the last 8760 hours. CBC: Recent Labs    01/03/17 1228 04/19/17 1034 09/08/17 0828  WBC 9.0 9.2 7.0  NEUTROABS 5,715  --  3,857  HGB 14.4 14.4 14.5  HCT 42.8 45.6 42.7  MCV 89.7 93.3 89.1  PLT 325 326 307   Cardiac Enzymes: Recent Labs    01/03/17 1228  CKTOTAL 117   BNP: Invalid input(s): POCBNP Lab Results  Component Value Date   HGBA1C 6.4 (H) 09/08/2017   Imaging and Procedures Recently: EKG reviewed as in hpi  Assessment/Plan 1. Annual physical exam -performed today, goes to gyn for breast and pelvic exam -had mammogram there--CMA to call for records from this  2. Mixed hyperlipidemia -LDL now at 100 right at goal and TG trending down with improved diet -cont to work on this and try to exercise as arthritis allows  3. Benign  essential hypertension -bp at goal, no changes needed - EKG 12-Lead  4. Sjogren's syndrome with keratoconjunctivitis sicca (HCC) -chronic and  stable, bothersome to her, followed by rheum  5. Depression, major, single episode, moderate (HCC) -says mood is up and down with pains and stressors in life like cat's illness  6. 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 Michelle Nasuti pt, he advised against surgical intervention due to challenging location and absence of consistent symptoms in this region -she suspects injury occurred during fall from horse in 20s perhaps -cont tylenol, topicals, ice or heat  7. DDD (degenerative disc disease), cervical -may consider surgery, but it's not consistently bothersome each day but does interfere with function and mood on those days  Labs/tests ordered:   Orders Placed This Encounter  Procedures  . EKG 12-Lead   6 mos med mgt, no labs unless something changes  Jemmie Ledgerwood L. Abeer Iversen, D.O. [facility] [address], [place] [address] Cell Phone (Mon-Fri 8am-5pm):  [phone] On Call:  [phone] & follow prompts after 5pm & weekends Office Phone:  [phone] Office Fax:  [phone]

## 2017-09-12 NOTE — Patient Instructions (Signed)
[name] , Thank you for taking time to come for your Medicare Wellness Visit. I appreciate your ongoing commitment to your health goals. Please review the following plan we discussed and let me know if I can assist you in the future.   Screening recommendations/referrals: Colonoscopy up to date, due 02/29/2020 Mammogram up to date, due February 2020 Bone Density up to date Recommended yearly ophthalmology/optometry visit for glaucoma screening and checkup Recommended yearly dental visit for hygiene and checkup  Vaccinations: Influenza vaccine up to date, due 2019 fall season Pneumococcal vaccine 23 due, given today. Up to date, completed Tdap vaccine due, declined Shingles vaccine due, ordered to pharmacy    Advanced directives: Advance directive discussed with you today. I have provided a copy for you to complete at home and have notarized. Once this is complete please bring a copy in to our office so we can scan it into your chart.  Conditions/risks identified: none  Next appointment: [name], RN 09/15/2018 @ 10am   Preventive Care 72 Years and Older, Female Preventive care refers to lifestyle choices and visits with your health care provider that can promote health and wellness. What does preventive care include?  A yearly physical exam. This is also called an annual well check.  Dental exams once or twice a year.  Routine eye exams. Ask your health care provider how often you should have your eyes checked.  Personal lifestyle choices, including:  Daily care of your teeth and gums.  Regular physical activity.  Eating a healthy diet.  Avoiding tobacco and drug use.  Limiting alcohol use.  Practicing safe sex.  Taking low-dose aspirin every day.  Taking vitamin and mineral supplements as recommended by your health care provider. What happens during an annual well check? The services and screenings done by your health care provider during your annual well  check will depend on your age, overall health, lifestyle risk factors, and family history of disease. Counseling  Your health care provider may ask you questions about your:  Alcohol use.  Tobacco use.  Drug use.  Emotional well-being.  Home and relationship well-being.  Sexual activity.  Eating habits.  History of falls.  Memory and ability to understand (cognition).  Work and work Statistician.  Reproductive health. Screening  You may have the following tests or measurements:  Height, weight, and BMI.  Blood pressure.  Lipid and cholesterol levels. These may be checked every 5 years, or more frequently if you are over 33 years old.  Skin check.  Lung cancer screening. You may have this screening every year starting at age 87 if you have a 30-pack-year history of smoking and currently smoke or have quit within the past 15 years.  Fecal occult blood test (FOBT) of the stool. You may have this test every year starting at age 76.  Flexible sigmoidoscopy or colonoscopy. You may have a sigmoidoscopy every 5 years or a colonoscopy every 10 years starting at age 18.  Hepatitis C blood test.  Hepatitis B blood test.  Sexually transmitted disease (STD) testing.  Diabetes screening. This is done by checking your blood sugar (glucose) after you have not eaten for a while (fasting). You may have this done every 1-3 years.  Bone density scan. This is done to screen for osteoporosis. You may have this done starting at age 1.  Mammogram. This may be done every 1-2 years. Talk to your health care provider about how often you should have regular mammograms. Talk with your  health care provider about your test results, treatment options, and if necessary, the need for more tests. Vaccines  Your health care provider may recommend certain vaccines, such as:  Influenza vaccine. This is recommended every year.  Tetanus, diphtheria, and acellular pertussis (Tdap, Td) vaccine. You  may need a Td booster every 10 years.  Zoster vaccine. You may need this after age 52.  Pneumococcal 13-valent conjugate (PCV13) vaccine. One dose is recommended after age 72.  Pneumococcal polysaccharide (PPSV23) vaccine. One dose is recommended after age 31. Talk to your health care provider about which screenings and vaccines you need and how often you need them. This information is not intended to replace advice given to you by your health care provider. Make sure you discuss any questions you have with your health care provider. Document Released: 04/25/2015 Document Revised: 12/17/2015 Document Reviewed: 01/28/2015 Elsevier Interactive Patient Education  2017 [place] Prevention in the Home Falls can cause injuries. They can happen to people of all ages. There are many things you can do to make your home safe and to help prevent falls. What can I do on the outside of my home?  Regularly fix the edges of walkways and driveways and fix any cracks.  Remove anything that might make you trip as you walk through a door, such as a raised step or threshold.  Trim any bushes or trees on the path to your home.  Use bright outdoor lighting.  Clear any walking paths of anything that might make someone trip, such as rocks or tools.  Regularly check to see if handrails are loose or broken. Make sure that both sides of any steps have handrails.  Any raised decks and porches should have guardrails on the edges.  Have any leaves, snow, or ice cleared regularly.  Use sand or salt on walking paths during winter.  Clean up any spills in your garage right away. This includes oil or grease spills. What can I do in the bathroom?  Use night lights.  Install grab bars by the toilet and in the tub and shower. Do not use towel bars as grab bars.  Use non-skid mats or decals in the tub or shower.  If you need to sit down in the shower, use a plastic, non-slip stool.  Keep the floor  dry. Clean up any water that spills on the floor as soon as it happens.  Remove soap buildup in the tub or shower regularly.  Attach bath mats securely with double-sided non-slip rug tape.  Do not have throw rugs and other things on the floor that can make you trip. What can I do in the bedroom?  Use night lights.  Make sure that you have a light by your bed that is easy to reach.  Do not use any sheets or blankets that are too big for your bed. They should not hang down onto the floor.  Have a firm chair that has side arms. You can use this for support while you get dressed.  Do not have throw rugs and other things on the floor that can make you trip. What can I do in the kitchen?  Clean up any spills right away.  Avoid walking on wet floors.  Keep items that you use a lot in easy-to-reach places.  If you need to reach something above you, use a strong step stool that has a grab bar.  Keep electrical cords out of the way.  Do not use  floor polish or wax that makes floors slippery. If you must use wax, use non-skid floor wax.  Do not have throw rugs and other things on the floor that can make you trip. What can I do with my stairs?  Do not leave any items on the stairs.  Make sure that there are handrails on both sides of the stairs and use them. Fix handrails that are broken or loose. Make sure that handrails are as long as the stairways.  Check any carpeting to make sure that it is firmly attached to the stairs. Fix any carpet that is loose or worn.  Avoid having throw rugs at the top or bottom of the stairs. If you do have throw rugs, attach them to the floor with carpet tape.  Make sure that you have a light switch at the top of the stairs and the bottom of the stairs. If you do not have them, ask someone to add them for you. What else can I do to help prevent falls?  Wear shoes that:  Do not have high heels.  Have rubber bottoms.  Are comfortable and fit you  well.  Are closed at the toe. Do not wear sandals.  If you use a stepladder:  Make sure that it is fully opened. Do not climb a closed stepladder.  Make sure that both sides of the stepladder are locked into place.  Ask someone to hold it for you, if possible.  Clearly mark and make sure that you can see:  Any grab bars or handrails.  First and last steps.  Where the edge of each step is.  Use tools that help you move around (mobility aids) if they are needed. These include:  Canes.  Walkers.  Scooters.  Crutches.  Turn on the lights when you go into a dark area. Replace any light bulbs as soon as they burn out.  Set up your furniture so you have a clear path. Avoid moving your furniture around.  If any of your floors are uneven, fix them.  If there are any pets around you, be aware of where they are.  Review your medicines with your doctor. Some medicines can make you feel dizzy. This can increase your chance of falling. Ask your doctor what other things that you can do to help prevent falls. This information is not intended to replace advice given to you by your health care provider. Make sure you discuss any questions you have with your health care provider. Document Released: 01/23/2009 Document Revised: 09/04/2015 Document Reviewed: 05/03/2014 Elsevier Interactive Patient Education  2017 [employer].

## 2017-09-12 NOTE — Progress Notes (Signed)
Subjective:   Felicia Hall is a 72 y.o. female who presents for Medicare Annual (Subsequent) preventive examination.  Last AWV-08/06/2016    Objective:     Vitals: BP 122/60 (BP Location: Left Arm, Patient Position: Sitting)   Pulse 79   Temp 98.1 F (36.7 C) (Oral)   Ht 5\' 7"  (1.702 m)   Wt 169 lb (76.7 kg)   SpO2 98%   BMI 26.47 kg/m   Body mass index is 26.47 kg/m.  Advanced Directives 09/12/2017 04/19/2017 08/06/2016 05/23/2016 01/15/2016 09/12/2015 09/12/2015  Does Patient Have a Medical Advance Directive? No Yes Yes No Yes No No  Type of Advance Directive - Living will [place];Living will - - - -  Does patient want to make changes to medical advance directive? - - No - Patient declined - - - -  Copy of [place] in Chart? - - No - copy requested - No - copy requested - -  Would patient like information on creating a medical advance directive? Yes (MAU/Ambulatory/Procedural Areas - Information given) - - No - Patient declined - - -    Tobacco Social History   Tobacco Use  Smoking Status Never Smoker  Smokeless Tobacco Never Used     Counseling given: Not Answered   Clinical Intake:  Pre-visit preparation completed: No  Pain : No/denies pain     Nutritional Risks: None Diabetes: No  How often do you need to have someone help you when you read instructions, pamphlets, or other written materials from your doctor or pharmacy?: 1 - Never What is the last grade level you completed in school?: College  Interpreter Needed?: No  Information entered by :: [name], RN  Past Medical History:  Diagnosis Date  . Acute upper respiratory infections of unspecified site   . Allergic rhinitis due to pollen   . Benign essential hypertension   . Cervical spondylosis without myelopathy   . Cervicalgia   . Diaphragmatic hernia without mention of obstruction or gangrene   . Disorder of bone and cartilage, unspecified   .  Diverticulosis of colon (without mention of hemorrhage)   . Encounter for long-term (current) use of other medications   . GERD (gastroesophageal reflux disease)   . History of hiatal hernia   . Hyperlipidemia LDL goal < 100   . 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 Other and unspecified hyperlipidemia   . Pain in joint, site unspecified   . Pre-diabetes   . Routine general medical examination at a health care facility   . Sjogren's syndrome ([place]) 10/27/2012  . Tear film insufficiency, unspecified   . Unspecified disorder of kidney and ureter   . Unspecified glaucoma(365.9)    Past Surgical History:  Procedure Laterality Date  . BELPHAROPTOSIS REPAIR     brow lift   bil  . CATARACT EXTRACTION W/ INTRAOCULAR LENS  IMPLANT, BILATERAL Bilateral 12/2014   Dr. Prudencio Burly  . [place]   . DILATION AND CURETTAGE OF UTERUS    . LUMBAR LAMINECTOMY/DECOMPRESSION MICRODISCECTOMY N/A 04/21/2017   Procedure: BILATERAL LUMBAR FOUR- LUMBAR FIVE AND LEFT LUMBAR FIVE- SACRAL ONE LAMINOTOMY, MEDIAL FACETECTOMY AND FORAMINOTOMIES;  Surgeon: Jovita Gamma, MD;  Location: [facility];  Service: Neurosurgery;  Laterality: N/A;  . NASAL SEPTUM SURGERY     Family History  Problem Relation Age of Onset  . Heart attack Mother   . Breast cancer Mother   . Pancreatitis Father   .  Heart disease Brother        By-pass surgery  . Lupus Cousin    Social History   Socioeconomic History  . Marital status: Single    Spouse name: Not on file  . Number of children: Not on file  . Years of education: Not on file  . Highest education level: Not on file  Occupational History  . Occupation: Herbalist  Social Needs  . Financial resource strain: Not hard at all  . Food insecurity:    Worry: Never true    Inability: Never true  . Transportation needs:    Medical: No    Non-medical: No  Tobacco Use  . Smoking status: Never Smoker  . Smokeless tobacco: Never Used    Substance and Sexual Activity  . Alcohol use: Yes    Alcohol/week: 0.0 oz    Comment: once a year  . Drug use: No  . Sexual activity: Not on file  Lifestyle  . Physical activity:    Days per week: 7 days    Minutes per session: 40 min  . Stress: Rather much  Relationships  . Social connections:    Talks on phone: Three times a week    Gets together: Three times a week    Attends religious service: Never    Active member of club or organization: No    Attends meetings of clubs or organizations: Never    Relationship status: Never married  Other Topics Concern  . Not on file  Social History Narrative   Single   No children   Never smoked   Alcohol none   Exercise -stretches, hand weights     Outpatient Encounter Medications as of 09/12/2017  Medication Sig  . b complex vitamins tablet Take 1 tablet by mouth daily.  . brinzolamide (AZOPT) 1 % ophthalmic suspension Place 1 drop into both eyes 2 (two) times daily.  . calcium citrate-vitamin D (CITRACAL+D) 315-200 MG-UNIT per tablet Take 1 tablet by mouth 4 (four) times daily.  . cholecalciferol (VITAMIN D) 1000 UNITS tablet Take 1,000 Units by mouth daily.   . cyclobenzaprine (FLEXERIL) 10 MG tablet Take 1 tablet (10 mg total) by mouth at bedtime as needed for muscle spasms.  . fenofibrate (TRICOR) 145 MG tablet Take one tablet by mouth once daily  . losartan (COZAAR) 50 MG tablet TAKE 1 TABLET BY MOUTH ONCE DAILY FOR BLOOD PRESSURE  . Omega-3 Fatty Acids (FISH OIL) 1200 MG CAPS Take 1 capsule by mouth.  Marland Kitchen omeprazole (PRILOSEC) 20 MG capsule Take 20 mg by mouth daily.   . Plant Sterols and Stanols (CHOLEST OFF PO) Take 1 tablet by mouth 3 (three) times daily.   Marland Kitchen Zoster Vaccine Adjuvanted [facility]) injection Inject 0.5 mLs into the muscle once for 1 dose.  . [DISCONTINUED] Zoster Vaccine Adjuvanted [facility]) injection Inject 0.5 mLs into the muscle once.   No facility-administered encounter medications on file as of 09/12/2017.      Activities of Daily Living In your present state of health, do you have any difficulty performing the following activities: 09/12/2017 04/19/2017  Hearing? N N  Vision? Y N  Difficulty concentrating or making decisions? N N  Walking or climbing stairs? N Y  Dressing or bathing? N N  Doing errands, shopping? N -  Preparing Food and eating ? N -  Using the Toilet? N -  In the past six months, have you accidently leaked urine? Y -  Comment stress incontinence -  Do  you have problems with loss of bowel control? N -  Managing your Medications? N -  Managing your Finances? N -  Housekeeping or managing your Housekeeping? N -  Some recent data might be hidden    Patient Care Team: Gayland Curry, DO as PCP - General (Geriatric Medicine)    Assessment:   This is a routine wellness examination for Felicia Hall.  Exercise Activities and Dietary recommendations Current Exercise Habits: Home exercise routine, Type of exercise: strength training/weights;Other - see comments(biking), Time (Minutes): 40, Frequency (Times/Week): 7, Weekly Exercise (Minutes/Week): 280, Intensity: Mild, Exercise limited by: None identified  Goals    . Weight (lb) < 160 lb (72.6 kg)     Starting 08/07/2016 I would like to lose weight by snacking less.       Fall Risk Fall Risk  09/12/2017 05/12/2017 02/14/2017 01/03/2017 08/06/2016  Falls in the past year? No No No No Yes  Number falls in past yr: - - - - 1  Injury with Fall? - - - - No  Risk for fall due to : - - - - -  Follow up - - - - -   Is the patient's home free of loose throw rugs in walkways, pet beds, electrical cords, etc?   yes      Grab bars in the bathroom? no      Handrails on the stairs?   yes      Adequate lighting?   yes   Depression Screen PHQ 2/9 Scores 09/12/2017 05/12/2017 02/14/2017 01/03/2017  PHQ - 2 Score 1 0 0 0  PHQ- 9 Score - - - -     Cognitive Function MMSE - Mini Mental State Exam 09/12/2017 08/06/2016 05/09/2015  Orientation to  time 5 5 5   Orientation to Place 5 5 5   Registration 3 3 3   Attention/ Calculation 5 5 5   Recall 3 2 3   Language- name 2 objects 2 2 2   Language- repeat 1 1 1   Language- follow 3 step command 3 3 3   Language- read & follow direction 1 1 1   Write a sentence 1 1 1   Copy design 1 1 1   Total score 30 29 30         Immunization History  Administered Date(s) Administered  . Influenza, High Dose Seasonal PF 01/03/2017  . Influenza,inj,Quad PF,6+ Mos 12/27/2014, 01/15/2016  . Influenza-Unspecified 01/10/2008, 02/15/2011, 01/20/2012  . Pneumococcal Conjugate-13 11/01/2013  . Pneumococcal Polysaccharide-23 09/14/2010, 09/12/2017  . Tdap 04/12/2005  . Zoster 04/12/2005    Qualifies for Shingles Vaccine? Yes, educated and ordered  Screening Tests Health Maintenance  Topic Date Due  . Hepatitis C Screening  [birth year]  . TETANUS/TDAP  09/13/2019 (Originally 04/13/2015)  . INFLUENZA VACCINE  11/10/2017  . MAMMOGRAM  04/16/2018  . Fecal DNA (Cologuard)  02/29/2020  . DEXA SCAN  Completed  . PNA vac Low Risk Adult  Completed    Cancer Screenings: Lung: Low Dose CT Chest recommended if Age 27-80 years, 30 pack-year currently smoking OR have quit w/in 15years. Patient does not qualify. Breast:  Up to date on Mammogram? Yes   Up to date of Bone Density/Dexa? Yes Colorectal: up to date  Additional Screenings:  Hepatitis C Screening: declined TDAP due-declined PNA 23 due, given today    Plan:    I have personally reviewed and addressed the Medicare Annual Wellness questionnaire and have noted the following in the patient's chart:  A. Medical and social history B. Use of alcohol, tobacco  or illicit drugs  C. Current medications and supplements D. Functional ability and status E.  Nutritional status F.  Physical activity G. Advance directives H. List of other physicians I.  Hospitalizations, surgeries, and ER visits in previous 12 months J.  [place] to include  hearing, vision, cognitive, depression L. Referrals and appointments - none  In addition, I have reviewed and discussed with patient certain preventive protocols, quality metrics, and best practice recommendations. A written personalized care plan for preventive services as well as general preventive health recommendations were provided to patient.  See attached scanned questionnaire for additional information.   Signed,   [name], RN Nurse Health Advisor  Patient Concerns: None

## 2017-09-15 ENCOUNTER — Encounter: Payer: Self-pay | Admitting: *Deleted

## 2017-09-18 ENCOUNTER — Other Ambulatory Visit: Payer: Self-pay | Admitting: Internal Medicine

## 2017-09-20 DIAGNOSIS — M5134 Other intervertebral disc degeneration, thoracic region: Secondary | ICD-10-CM | POA: Diagnosis not present

## 2017-09-20 DIAGNOSIS — M9903 Segmental and somatic dysfunction of lumbar region: Secondary | ICD-10-CM | POA: Diagnosis not present

## 2017-09-20 DIAGNOSIS — M9902 Segmental and somatic dysfunction of thoracic region: Secondary | ICD-10-CM | POA: Diagnosis not present

## 2017-09-20 DIAGNOSIS — M5136 Other intervertebral disc degeneration, lumbar region: Secondary | ICD-10-CM | POA: Diagnosis not present

## 2017-10-04 DIAGNOSIS — M9903 Segmental and somatic dysfunction of lumbar region: Secondary | ICD-10-CM | POA: Diagnosis not present

## 2017-10-04 DIAGNOSIS — M9902 Segmental and somatic dysfunction of thoracic region: Secondary | ICD-10-CM | POA: Diagnosis not present

## 2017-10-04 DIAGNOSIS — M5134 Other intervertebral disc degeneration, thoracic region: Secondary | ICD-10-CM | POA: Diagnosis not present

## 2017-10-04 DIAGNOSIS — M5136 Other intervertebral disc degeneration, lumbar region: Secondary | ICD-10-CM | POA: Diagnosis not present

## 2017-10-20 DIAGNOSIS — M5134 Other intervertebral disc degeneration, thoracic region: Secondary | ICD-10-CM | POA: Diagnosis not present

## 2017-10-20 DIAGNOSIS — M9903 Segmental and somatic dysfunction of lumbar region: Secondary | ICD-10-CM | POA: Diagnosis not present

## 2017-10-20 DIAGNOSIS — M5136 Other intervertebral disc degeneration, lumbar region: Secondary | ICD-10-CM | POA: Diagnosis not present

## 2017-10-20 DIAGNOSIS — H04122 Dry eye syndrome of left lacrimal gland: Secondary | ICD-10-CM | POA: Diagnosis not present

## 2017-10-20 DIAGNOSIS — H401132 Primary open-angle glaucoma, bilateral, moderate stage: Secondary | ICD-10-CM | POA: Diagnosis not present

## 2017-10-20 DIAGNOSIS — M9902 Segmental and somatic dysfunction of thoracic region: Secondary | ICD-10-CM | POA: Diagnosis not present

## 2017-11-03 DIAGNOSIS — M9902 Segmental and somatic dysfunction of thoracic region: Secondary | ICD-10-CM | POA: Diagnosis not present

## 2017-11-03 DIAGNOSIS — M5136 Other intervertebral disc degeneration, lumbar region: Secondary | ICD-10-CM | POA: Diagnosis not present

## 2017-11-03 DIAGNOSIS — M9903 Segmental and somatic dysfunction of lumbar region: Secondary | ICD-10-CM | POA: Diagnosis not present

## 2017-11-03 DIAGNOSIS — M5134 Other intervertebral disc degeneration, thoracic region: Secondary | ICD-10-CM | POA: Diagnosis not present

## 2017-11-18 ENCOUNTER — Other Ambulatory Visit: Payer: Self-pay | Admitting: Internal Medicine

## 2017-11-22 DIAGNOSIS — M5134 Other intervertebral disc degeneration, thoracic region: Secondary | ICD-10-CM | POA: Diagnosis not present

## 2017-11-22 DIAGNOSIS — M9902 Segmental and somatic dysfunction of thoracic region: Secondary | ICD-10-CM | POA: Diagnosis not present

## 2017-11-22 DIAGNOSIS — M5136 Other intervertebral disc degeneration, lumbar region: Secondary | ICD-10-CM | POA: Diagnosis not present

## 2017-11-22 DIAGNOSIS — M9903 Segmental and somatic dysfunction of lumbar region: Secondary | ICD-10-CM | POA: Diagnosis not present

## 2017-12-01 DIAGNOSIS — M9905 Segmental and somatic dysfunction of pelvic region: Secondary | ICD-10-CM | POA: Diagnosis not present

## 2017-12-01 DIAGNOSIS — M9904 Segmental and somatic dysfunction of sacral region: Secondary | ICD-10-CM | POA: Diagnosis not present

## 2017-12-01 DIAGNOSIS — M5136 Other intervertebral disc degeneration, lumbar region: Secondary | ICD-10-CM | POA: Diagnosis not present

## 2017-12-01 DIAGNOSIS — M9903 Segmental and somatic dysfunction of lumbar region: Secondary | ICD-10-CM | POA: Diagnosis not present

## 2017-12-08 DIAGNOSIS — M9904 Segmental and somatic dysfunction of sacral region: Secondary | ICD-10-CM | POA: Diagnosis not present

## 2017-12-08 DIAGNOSIS — M9903 Segmental and somatic dysfunction of lumbar region: Secondary | ICD-10-CM | POA: Diagnosis not present

## 2017-12-08 DIAGNOSIS — M9905 Segmental and somatic dysfunction of pelvic region: Secondary | ICD-10-CM | POA: Diagnosis not present

## 2017-12-08 DIAGNOSIS — M5136 Other intervertebral disc degeneration, lumbar region: Secondary | ICD-10-CM | POA: Diagnosis not present

## 2017-12-15 DIAGNOSIS — M5136 Other intervertebral disc degeneration, lumbar region: Secondary | ICD-10-CM | POA: Diagnosis not present

## 2017-12-15 DIAGNOSIS — M9903 Segmental and somatic dysfunction of lumbar region: Secondary | ICD-10-CM | POA: Diagnosis not present

## 2017-12-15 DIAGNOSIS — M9905 Segmental and somatic dysfunction of pelvic region: Secondary | ICD-10-CM | POA: Diagnosis not present

## 2017-12-15 DIAGNOSIS — M9904 Segmental and somatic dysfunction of sacral region: Secondary | ICD-10-CM | POA: Diagnosis not present

## 2017-12-29 DIAGNOSIS — M9904 Segmental and somatic dysfunction of sacral region: Secondary | ICD-10-CM | POA: Diagnosis not present

## 2017-12-29 DIAGNOSIS — M9905 Segmental and somatic dysfunction of pelvic region: Secondary | ICD-10-CM | POA: Diagnosis not present

## 2017-12-29 DIAGNOSIS — M9903 Segmental and somatic dysfunction of lumbar region: Secondary | ICD-10-CM | POA: Diagnosis not present

## 2017-12-29 DIAGNOSIS — M5136 Other intervertebral disc degeneration, lumbar region: Secondary | ICD-10-CM | POA: Diagnosis not present

## 2018-01-17 DIAGNOSIS — M9903 Segmental and somatic dysfunction of lumbar region: Secondary | ICD-10-CM | POA: Diagnosis not present

## 2018-01-17 DIAGNOSIS — M9905 Segmental and somatic dysfunction of pelvic region: Secondary | ICD-10-CM | POA: Diagnosis not present

## 2018-01-17 DIAGNOSIS — M9904 Segmental and somatic dysfunction of sacral region: Secondary | ICD-10-CM | POA: Diagnosis not present

## 2018-01-17 DIAGNOSIS — M5136 Other intervertebral disc degeneration, lumbar region: Secondary | ICD-10-CM | POA: Diagnosis not present

## 2018-02-02 DIAGNOSIS — M5134 Other intervertebral disc degeneration, thoracic region: Secondary | ICD-10-CM | POA: Diagnosis not present

## 2018-02-02 DIAGNOSIS — M9902 Segmental and somatic dysfunction of thoracic region: Secondary | ICD-10-CM | POA: Diagnosis not present

## 2018-02-02 DIAGNOSIS — M5136 Other intervertebral disc degeneration, lumbar region: Secondary | ICD-10-CM | POA: Diagnosis not present

## 2018-02-02 DIAGNOSIS — M9903 Segmental and somatic dysfunction of lumbar region: Secondary | ICD-10-CM | POA: Diagnosis not present

## 2018-02-22 DIAGNOSIS — M5134 Other intervertebral disc degeneration, thoracic region: Secondary | ICD-10-CM | POA: Diagnosis not present

## 2018-02-22 DIAGNOSIS — M5136 Other intervertebral disc degeneration, lumbar region: Secondary | ICD-10-CM | POA: Diagnosis not present

## 2018-02-22 DIAGNOSIS — M9902 Segmental and somatic dysfunction of thoracic region: Secondary | ICD-10-CM | POA: Diagnosis not present

## 2018-02-22 DIAGNOSIS — M9903 Segmental and somatic dysfunction of lumbar region: Secondary | ICD-10-CM | POA: Diagnosis not present

## 2018-03-07 DIAGNOSIS — M5134 Other intervertebral disc degeneration, thoracic region: Secondary | ICD-10-CM | POA: Diagnosis not present

## 2018-03-07 DIAGNOSIS — M9903 Segmental and somatic dysfunction of lumbar region: Secondary | ICD-10-CM | POA: Diagnosis not present

## 2018-03-07 DIAGNOSIS — M9902 Segmental and somatic dysfunction of thoracic region: Secondary | ICD-10-CM | POA: Diagnosis not present

## 2018-03-07 DIAGNOSIS — M5136 Other intervertebral disc degeneration, lumbar region: Secondary | ICD-10-CM | POA: Diagnosis not present

## 2018-03-16 ENCOUNTER — Ambulatory Visit (INDEPENDENT_AMBULATORY_CARE_PROVIDER_SITE_OTHER): Payer: Medicare Other | Admitting: Internal Medicine

## 2018-03-16 ENCOUNTER — Encounter: Payer: Self-pay | Admitting: Internal Medicine

## 2018-03-16 VITALS — BP 120/88 | HR 95 | Temp 98.1°F | Ht 67.0 in | Wt 169.0 lb

## 2018-03-16 DIAGNOSIS — Z23 Encounter for immunization: Secondary | ICD-10-CM

## 2018-03-16 DIAGNOSIS — E782 Mixed hyperlipidemia: Secondary | ICD-10-CM

## 2018-03-16 DIAGNOSIS — L723 Sebaceous cyst: Secondary | ICD-10-CM

## 2018-03-16 DIAGNOSIS — I10 Essential (primary) hypertension: Secondary | ICD-10-CM | POA: Diagnosis not present

## 2018-03-16 DIAGNOSIS — R739 Hyperglycemia, unspecified: Secondary | ICD-10-CM

## 2018-03-16 DIAGNOSIS — Z7189 Other specified counseling: Secondary | ICD-10-CM | POA: Diagnosis not present

## 2018-03-16 DIAGNOSIS — Z1159 Encounter for screening for other viral diseases: Secondary | ICD-10-CM

## 2018-03-16 MED ORDER — ZOSTER VAC RECOMB ADJUVANTED 50 MCG/0.5ML IM SUSR: 0.5000 mL | Freq: Once | INTRAMUSCULAR | 1 refills | Status: AC

## 2018-03-16 NOTE — ACP (Advance Care Planning) (Addendum)
Pt brought her living will and health care power of attorney today.  We reviewed that tube feedings are not recommended in late dementia due to lack of benefit.  She had described a friend of hers who had a stroke and was put into a nursing home and not tube fed--she felt like they starved.  We discussed that the body shuts down and does not need this nutrition when it's near end of life.  She expressed understanding, but says, "I'll be out of it anyway."    Copy of living will received and will be scanned in her chart.   17 mins spent discussing what she has selected and with education as above.

## 2018-03-16 NOTE — Progress Notes (Signed)
Location:  [facility] clinic Provider:  Ellieanna Funderburg L. Mariea Clonts, D.O., C.M.D.  Goals of Care:  Advanced Directives 03/16/2018  Does Patient Have a Medical Advance Directive? Yes  Type of Paramedic of Onset;Living will  Does patient want to make changes to medical advance directive? No - Patient declined  Copy of [place] in Chart? No - copy requested  Would patient like information on creating a medical advance directive? -     Chief Complaint  Patient presents with  . Medical Management of Chronic Issues    74mth follow-up    HPI: Patient is a 72 y.o. female seen today for medical management of chronic diseases.    Pain comes and goes.  Not particularly painful today.  BP up today.  She had salty tasting chicken salad last night.  She's not had holiday food.    She had a reaction to the pneumonia shot in June.  Her whole left arm was sore and stiff to where she could not raise it.  Should had to hold it against her body like it was in a sling.  Is done with pneumonia shots fortunately.    Has a small cyst that's appeared under her left axilla since her last visit.  Started off smaller and grew to pencil eraser size and has stayed there.  Nonpainful, nonitchy.    Past Medical History:  Diagnosis Date  . Acute upper respiratory infections of unspecified site   . Allergic rhinitis due to pollen   . Benign essential hypertension   . Cervical spondylosis without myelopathy   . Cervicalgia   . Diaphragmatic hernia without mention of obstruction or gangrene   . Disorder of bone and cartilage, unspecified   . Diverticulosis of colon (without mention of hemorrhage)   . Encounter for long-term (current) use of other medications   . GERD (gastroesophageal reflux disease)   . History of hiatal hernia   . Hyperlipidemia LDL goal < 100   . 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 Other and unspecified hyperlipidemia   . Pain in  joint, site unspecified   . Pre-diabetes   . Routine general medical examination at a health care facility   . Sjogren's syndrome ([place]) 10/27/2012  . Tear film insufficiency, unspecified   . Unspecified disorder of kidney and ureter   . Unspecified glaucoma(365.9)     Past Surgical History:  Procedure Laterality Date  . BELPHAROPTOSIS REPAIR     brow lift   bil  . CATARACT EXTRACTION W/ INTRAOCULAR LENS  IMPLANT, BILATERAL Bilateral 12/2014   Dr. Prudencio Burly  . [place]   . DILATION AND CURETTAGE OF UTERUS    . LUMBAR LAMINECTOMY/DECOMPRESSION MICRODISCECTOMY N/A 04/21/2017   Procedure: BILATERAL LUMBAR FOUR- LUMBAR FIVE AND LEFT LUMBAR FIVE- SACRAL ONE LAMINOTOMY, MEDIAL FACETECTOMY AND FORAMINOTOMIES;  Surgeon: Jovita Gamma, MD;  Location: [place];  Service: Neurosurgery;  Laterality: N/A;  . NASAL SEPTUM SURGERY      Allergies  Allergen Reactions  . Statins Other (See Comments)    Muscle problems  . Allopurinol Rash  . Darvocet [Propoxyphene N-Acetaminophen] Rash    Outpatient Encounter Medications as of 03/16/2018  Medication Sig  . b complex vitamins tablet Take 1 tablet by mouth daily.  . brinzolamide (AZOPT) 1 % ophthalmic suspension Place 1 drop into both eyes 2 (two) times daily.  . calcium citrate-vitamin D (CITRACAL+D) 315-200 MG-UNIT per tablet Take 1 tablet by mouth  4 (four) times daily.  . cholecalciferol (VITAMIN D) 1000 UNITS tablet Take 1,000 Units by mouth daily.   . fenofibrate (TRICOR) 145 MG tablet TAKE 1 TABLET BY MOUTH EVERY DAY  . losartan (COZAAR) 50 MG tablet TAKE 1 TABLET BY MOUTH ONCE DAILY FOR BLOOD PRESSURE  . omeprazole (PRILOSEC) 20 MG capsule Take 20 mg by mouth daily.   . Plant Sterols and Stanols (CHOLEST OFF PO) Take 1 tablet by mouth 3 (three) times daily.   Marland Kitchen Zoster Vaccine Adjuvanted [facility]) injection Inject 0.5 mLs into the muscle once for 1 dose.  . [DISCONTINUED] cyclobenzaprine (FLEXERIL) 10 MG tablet Take 1  tablet (10 mg total) by mouth at bedtime as needed for muscle spasms.  . [DISCONTINUED] Omega-3 Fatty Acids (FISH OIL) 1200 MG CAPS Take 1 capsule by mouth.   No facility-administered encounter medications on file as of 03/16/2018.     Review of Systems:  Review of Systems  Constitutional: Positive for malaise/fatigue. Negative for chills and fever.  HENT: Negative for congestion and hearing loss.   Eyes: Negative for blurred vision.  Respiratory: Negative for shortness of breath.   Cardiovascular: Negative for chest pain, palpitations and leg swelling.  Gastrointestinal: Negative for abdominal pain, blood in stool, constipation, diarrhea and melena.  Genitourinary: Negative for dysuria.  Musculoskeletal: Positive for back pain, joint pain, myalgias and neck pain. Negative for falls.  Skin: Negative for itching and rash.       New cyst under left under arm  Neurological: Negative for dizziness.  Endo/Heme/Allergies: Does not bruise/bleed easily.  Psychiatric/Behavioral: Negative for depression and memory loss. The patient is not nervous/anxious and does not have insomnia.     Health Maintenance  Topic Date Due  . Hepatitis C Screening  [birth year]  . INFLUENZA VACCINE  11/10/2017  . TETANUS/TDAP  09/13/2019 (Originally 04/13/2015)  . MAMMOGRAM  06/09/2019  . Fecal DNA (Cologuard)  02/29/2020  . DEXA SCAN  Completed  . PNA vac Low Risk Adult  Completed    Physical Exam: Vitals:   03/16/18 1005  BP: 120/88  Pulse: 95  Temp: 98.1 F (36.7 C)  TempSrc: Oral  SpO2: 98%  Weight: 169 lb (76.7 kg)  Height: 5\' 7"  (1.702 m)   Body mass index is 26.47 kg/m. Physical Exam  Constitutional: She is oriented to person, place, and time. She appears well-developed and well-nourished. No distress.  Cardiovascular: Normal rate, regular rhythm, normal heart sounds and intact distal pulses.  Pulmonary/Chest: Effort normal and breath sounds normal. No respiratory distress.  Abdominal: Bowel  sounds are normal.  Musculoskeletal: Normal range of motion.  Neurological: She is alert and oriented to person, place, and time.  Skin: Skin is warm and dry.  Left axilla with pencil eraser sized cystic lesion just subcutaneous anteriorly, nontender  Psychiatric: She has a normal mood and affect.    Labs reviewed: Basic Metabolic Panel: Recent Labs    04/19/17 1034 09/08/17 0828  NA 137 138  K 3.7 4.4  CL 103 104  CO2 25 24  GLUCOSE 103* 120*  BUN 15 11  CREATININE 0.96 0.88  CALCIUM 10.1 9.9   Liver Function Tests: Recent Labs    09/08/17 0828  AST 25  ALT 27  BILITOT 0.6  PROT 6.9   No results for input(s): LIPASE, AMYLASE in the last 8760 hours. No results for input(s): AMMONIA in the last 8760 hours. CBC: Recent Labs    04/19/17 1034 09/08/17 0828  WBC 9.2 7.0  NEUTROABS  --  3,857  HGB 14.4 14.5  HCT 45.6 42.7  MCV 93.3 89.1  PLT 326 307   Lipid Panel: Recent Labs    09/08/17 0828  CHOL 158  HDL 37*  LDLCALC 100*  TRIG 109  CHOLHDL 4.3   Lab Results  Component Value Date   HGBA1C 6.4 (H) 09/08/2017    Assessment/Plan 1. Need for influenza vaccination - Flu vaccine HIGH DOSE PF (Fluzone High dose)  2. Need for shingles vaccine -[facility] so sent to the one on [address] instead - Zoster Vaccine Adjuvanted [facility]) injection; Inject 0.5 mLs into the muscle once for 1 dose.  Dispense: 0.5 mL; Refill: 1  3. Benign essential hypertension -bp typicallly controlled, was high this am after some salty chicken salad last night -recheck came down end of visit - cont current regimen--if high again next time, will adjust medication - CBC with Differential/Platelet; Future - COMPLETE METABOLIC PANEL WITH GFR; Future  4. Need for hepatitis C screening test - agrees to screen before next visit even if has to pay for it - Hepatitis C antibody; Future  5. Mixed hyperlipidemia -cont to work on [employer], cont tricor and zetia, cannot tolerate statin - Lipid panel; Future  6. Hyperglycemia - f/u lab before next visit, counseling about healthy diet offered - Hemoglobin A1c; Future  7. Sebaceous cyst of left axilla -noted new today, monitor   8. ACP (advance care planning) - Last ACP Note 12/16/2017 to 03/16/2018       ACP (Advance Care Planning) by Gayland Curry, DO at 03/16/2018 10:00 AM    Date of Service   Author Author Type Status Note Type File Time  03/16/2018 Addend Gayland Curry, DO Physician Addendum ACP (Advance Care Planning) 03/16/2018             Pt brought her living will and health care power of attorney today.  We reviewed that tube feedings are not recommended in late dementia due to lack of benefit.  She had described a friend of hers who had a stroke and was put into a nursing home and not tube fed--she felt like they starved.  We discussed that the body shuts down and does not need this nutrition when it's near end of life.  She expressed understanding, but says, "I'll be out of it anyway."    Copy of living will received and will be scanned in her chart.   17 mins spent discussing what she has selected and with education as above.                  Labs/tests ordered:   Orders Placed This Encounter  Procedures  . Flu vaccine HIGH DOSE PF (Fluzone High dose)  . CBC with Differential/Platelet    Standing Status:   Future    Standing Expiration Date:   03/17/2019  . COMPLETE METABOLIC PANEL WITH GFR    Standing Status:   Future    Standing Expiration Date:   03/17/2019  . Hemoglobin A1c    Standing Status:   Future    Standing Expiration Date:   03/17/2019  . Hepatitis C antibody    Standing Status:   Future    Standing Expiration Date:   03/17/2019  . Lipid panel    Standing Status:   Future    Standing Expiration Date:   03/17/2019    Next appt:  6 mos for CPE, fasting labs before  Adelisa Satterwhite L. Yahira Timberman, D.O. [facility] [address], [place] [address] Cell Phone (Mon-Fri 8am-5pm):  [phone] On Call:  [phone] & follow prompts after 5pm & weekends Office Phone:  [phone] Office Fax:  [phone]

## 2018-03-21 DIAGNOSIS — M9902 Segmental and somatic dysfunction of thoracic region: Secondary | ICD-10-CM | POA: Diagnosis not present

## 2018-03-21 DIAGNOSIS — M5136 Other intervertebral disc degeneration, lumbar region: Secondary | ICD-10-CM | POA: Diagnosis not present

## 2018-03-21 DIAGNOSIS — M9903 Segmental and somatic dysfunction of lumbar region: Secondary | ICD-10-CM | POA: Diagnosis not present

## 2018-03-21 DIAGNOSIS — M5134 Other intervertebral disc degeneration, thoracic region: Secondary | ICD-10-CM | POA: Diagnosis not present

## 2018-03-24 ENCOUNTER — Other Ambulatory Visit: Payer: Self-pay | Admitting: Internal Medicine

## 2018-03-30 DIAGNOSIS — M9903 Segmental and somatic dysfunction of lumbar region: Secondary | ICD-10-CM | POA: Diagnosis not present

## 2018-03-30 DIAGNOSIS — M5134 Other intervertebral disc degeneration, thoracic region: Secondary | ICD-10-CM | POA: Diagnosis not present

## 2018-03-30 DIAGNOSIS — M5136 Other intervertebral disc degeneration, lumbar region: Secondary | ICD-10-CM | POA: Diagnosis not present

## 2018-03-30 DIAGNOSIS — M9902 Segmental and somatic dysfunction of thoracic region: Secondary | ICD-10-CM | POA: Diagnosis not present

## 2018-03-31 DIAGNOSIS — H401132 Primary open-angle glaucoma, bilateral, moderate stage: Secondary | ICD-10-CM | POA: Diagnosis not present

## 2018-04-10 DIAGNOSIS — M5134 Other intervertebral disc degeneration, thoracic region: Secondary | ICD-10-CM | POA: Diagnosis not present

## 2018-04-10 DIAGNOSIS — M5136 Other intervertebral disc degeneration, lumbar region: Secondary | ICD-10-CM | POA: Diagnosis not present

## 2018-04-10 DIAGNOSIS — M9902 Segmental and somatic dysfunction of thoracic region: Secondary | ICD-10-CM | POA: Diagnosis not present

## 2018-04-10 DIAGNOSIS — M9903 Segmental and somatic dysfunction of lumbar region: Secondary | ICD-10-CM | POA: Diagnosis not present

## 2018-04-25 DIAGNOSIS — M5136 Other intervertebral disc degeneration, lumbar region: Secondary | ICD-10-CM | POA: Diagnosis not present

## 2018-04-25 DIAGNOSIS — M9905 Segmental and somatic dysfunction of pelvic region: Secondary | ICD-10-CM | POA: Diagnosis not present

## 2018-04-25 DIAGNOSIS — M9903 Segmental and somatic dysfunction of lumbar region: Secondary | ICD-10-CM | POA: Diagnosis not present

## 2018-04-25 DIAGNOSIS — M9904 Segmental and somatic dysfunction of sacral region: Secondary | ICD-10-CM | POA: Diagnosis not present

## 2018-05-08 DIAGNOSIS — M9903 Segmental and somatic dysfunction of lumbar region: Secondary | ICD-10-CM | POA: Diagnosis not present

## 2018-05-08 DIAGNOSIS — M5136 Other intervertebral disc degeneration, lumbar region: Secondary | ICD-10-CM | POA: Diagnosis not present

## 2018-05-08 DIAGNOSIS — M9905 Segmental and somatic dysfunction of pelvic region: Secondary | ICD-10-CM | POA: Diagnosis not present

## 2018-05-08 DIAGNOSIS — M9904 Segmental and somatic dysfunction of sacral region: Secondary | ICD-10-CM | POA: Diagnosis not present

## 2018-05-11 DIAGNOSIS — M9904 Segmental and somatic dysfunction of sacral region: Secondary | ICD-10-CM | POA: Diagnosis not present

## 2018-05-11 DIAGNOSIS — M9905 Segmental and somatic dysfunction of pelvic region: Secondary | ICD-10-CM | POA: Diagnosis not present

## 2018-05-11 DIAGNOSIS — M5136 Other intervertebral disc degeneration, lumbar region: Secondary | ICD-10-CM | POA: Diagnosis not present

## 2018-05-11 DIAGNOSIS — M9903 Segmental and somatic dysfunction of lumbar region: Secondary | ICD-10-CM | POA: Diagnosis not present

## 2018-05-20 ENCOUNTER — Other Ambulatory Visit: Payer: Self-pay | Admitting: Internal Medicine

## 2018-05-22 DIAGNOSIS — M9903 Segmental and somatic dysfunction of lumbar region: Secondary | ICD-10-CM | POA: Diagnosis not present

## 2018-05-22 DIAGNOSIS — M9904 Segmental and somatic dysfunction of sacral region: Secondary | ICD-10-CM | POA: Diagnosis not present

## 2018-05-22 DIAGNOSIS — M5136 Other intervertebral disc degeneration, lumbar region: Secondary | ICD-10-CM | POA: Diagnosis not present

## 2018-05-22 DIAGNOSIS — M9905 Segmental and somatic dysfunction of pelvic region: Secondary | ICD-10-CM | POA: Diagnosis not present

## 2018-05-31 ENCOUNTER — Ambulatory Visit (INDEPENDENT_AMBULATORY_CARE_PROVIDER_SITE_OTHER): Payer: Medicare Other | Admitting: Family

## 2018-05-31 ENCOUNTER — Encounter: Payer: Self-pay | Admitting: Family

## 2018-05-31 VITALS — BP 118/78 | HR 87 | Temp 97.5°F | Ht 67.0 in | Wt 171.2 lb

## 2018-05-31 DIAGNOSIS — H6122 Impacted cerumen, left ear: Secondary | ICD-10-CM | POA: Diagnosis not present

## 2018-05-31 MED ORDER — CARBAMIDE PEROXIDE 6.5 % OT SOLN: 5.0000 [drp] | Freq: Two times a day (BID) | OTIC | 0 refills | Status: AC

## 2018-05-31 NOTE — Progress Notes (Signed)
Provider: Rashi Granier FNP-C  Gayland Curry, DO  Patient Care Team: Gayland Curry, DO as PCP - General (Geriatric Medicine)  Extended Emergency Contact Information Primary Emergency Contact: Chery,Thomas Address: [address], [place] Phone: [phone] Relation: Brother  Goals of care: Advanced Directive information Advanced Directives 05/31/2018  Does Patient Have a Medical Advance Directive? Yes  Type of Advance Directive [place]  Does patient want to make changes to medical advance directive? No - Patient declined  Copy of [place] in Chart? Yes - validated most recent copy scanned in chart (See row information)  Would patient like information on creating a medical advance directive? -     Chief Complaint  Patient presents with  . Acute Visit    patient c/o of left ear, states not feeling right, some blood was noticed when using a cotten swab. States it only happens once in a while. Noticed "last week or week before last"    HPI:  Pt is a 73 y.o. female seen today for an acute visit for evaluation of left ear stinging sensation.She states water usually gets in her ear whenever she washes her hair and uses Q-tip to dry the ears.Recent used Q-tip on her left ear and noticed some blood.since then she has had a constant stinging sensation.she denies any pain in the ear or drainage in the ear.no changes in her hearing.she denies any fever,chills or cough.    Past Medical History:  Diagnosis Date  . Acute upper respiratory infections of unspecified site   . Allergic rhinitis due to pollen   . Benign essential hypertension   . Cervical spondylosis without myelopathy   . Cervicalgia   . Diaphragmatic hernia without mention of obstruction or gangrene   . Disorder of bone and cartilage, unspecified   . Diverticulosis of colon (without mention of hemorrhage)   . Encounter for  long-term (current) use of other medications   . GERD (gastroesophageal reflux disease)   . History of hiatal hernia   . Hyperlipidemia LDL goal < 100   . 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 Other and unspecified hyperlipidemia   . Pain in joint, site unspecified   . Pre-diabetes   . Routine general medical examination at a health care facility   . Sjogren's syndrome ([place]) 10/27/2012  . Tear film insufficiency, unspecified   . Unspecified disorder of kidney and ureter   . Unspecified glaucoma(365.9)    Past Surgical History:  Procedure Laterality Date  . BELPHAROPTOSIS REPAIR     brow lift   bil  . CATARACT EXTRACTION W/ INTRAOCULAR LENS  IMPLANT, BILATERAL Bilateral 12/2014   Dr. Prudencio Burly  . [place]   . DILATION AND CURETTAGE OF UTERUS    . LUMBAR LAMINECTOMY/DECOMPRESSION MICRODISCECTOMY N/A 04/21/2017   Procedure: BILATERAL LUMBAR FOUR- LUMBAR FIVE AND LEFT LUMBAR FIVE- SACRAL ONE LAMINOTOMY, MEDIAL FACETECTOMY AND FORAMINOTOMIES;  Surgeon: Jovita Gamma, MD;  Location: [place];  Service: Neurosurgery;  Laterality: N/A;  . NASAL SEPTUM SURGERY      Allergies  Allergen Reactions  . Statins Other (See Comments)    Muscle problems  . Allopurinol Rash  . Darvocet [Propoxyphene N-Acetaminophen] Rash    Outpatient Encounter Medications as of 05/31/2018  Medication Sig  . b complex vitamins tablet Take 1 tablet by mouth daily.  . brinzolamide (AZOPT) 1 %  ophthalmic suspension Place 1 drop into both eyes 2 (two) times daily.  . calcium citrate-vitamin D (CITRACAL+D) 315-200 MG-UNIT per tablet Take 1 tablet by mouth 4 (four) times daily.  . cholecalciferol (VITAMIN D) 1000 UNITS tablet Take 1,000 Units by mouth daily.   . fenofibrate (TRICOR) 145 MG tablet TAKE 1 TABLET BY MOUTH EVERY DAY  . losartan (COZAAR) 50 MG tablet TAKE 1 TABLET BY MOUTH ONCE DAILY FOR BLOOD PRESSURE  . omeprazole (PRILOSEC) 20 MG capsule Take 20 mg by mouth  daily.   . Plant Sterols and Stanols (CHOLEST OFF PO) Take 1 tablet by mouth 3 (three) times daily.    No facility-administered encounter medications on file as of 05/31/2018.     Review of Systems  Constitutional: Negative for chills and fever.  HENT: Positive for postnasal drip and rhinorrhea. Negative for congestion, ear discharge, ear pain, sinus pressure, sinus pain, sneezing and sore throat.   Eyes: Negative for pain, discharge and redness.       Dry eyes wears eye glasses   Respiratory: Negative for cough, chest tightness, shortness of breath and wheezing.   Cardiovascular: Negative for chest pain, palpitations and leg swelling.  Neurological: Negative for dizziness, light-headedness and headaches.    Immunization History  Administered Date(s) Administered  . Influenza, High Dose Seasonal PF 01/03/2017, 03/16/2018  . Influenza,inj,Quad PF,6+ Mos 12/27/2014, 01/15/2016  . Influenza-Unspecified 01/10/2008, 02/15/2011, 01/20/2012  . Pneumococcal Conjugate-13 11/01/2013  . Pneumococcal Polysaccharide-23 09/14/2010, 09/12/2017  . Tdap 04/12/2005  . Zoster 04/12/2005   Pertinent  Health Maintenance Due  Topic Date Due  . MAMMOGRAM  06/09/2019  . INFLUENZA VACCINE  Completed  . DEXA SCAN  Completed  . PNA vac Low Risk Adult  Completed   Fall Risk  05/31/2018 03/16/2018 09/12/2017 05/12/2017 02/14/2017  Falls in the past year? 0 0 No No No  Number falls in past yr: 0 0 - - -  Injury with Fall? 0 0 - - -  Risk for fall due to : - - - - -  Follow up - - - - -    Vitals:   05/31/18 1002  BP: 118/78  Pulse: 87  Temp: (!) 97.5 F (36.4 C)  TempSrc: Oral  SpO2: 97%  Weight: 171 lb 3.2 oz (77.7 kg)  Height: 5\' 7"  (1.702 m)   Body mass index is 26.81 kg/m. Physical Exam Constitutional:      General: She is not in acute distress. HENT:     Head: Normocephalic.     Right Ear: Tympanic membrane, ear canal and external ear normal. There is no impacted cerumen.     Left Ear:  There is impacted cerumen.     Nose: Nose normal. No congestion or rhinorrhea.     Mouth/Throat:     Mouth: Mucous membranes are moist.     Pharynx: Oropharynx is clear. No oropharyngeal exudate or posterior oropharyngeal erythema.  Eyes:     General: No scleral icterus.       Right eye: No discharge.        Left eye: No discharge.     Conjunctiva/sclera: Conjunctivae normal.     Pupils: Pupils are equal, round, and reactive to light.  Neck:     Musculoskeletal: Normal range of motion. No muscular tenderness.  Cardiovascular:     Rate and Rhythm: Normal rate and regular rhythm.     Heart sounds: Normal heart sounds. No murmur. No friction rub. No gallop.   Pulmonary:  Effort: Pulmonary effort is normal. No respiratory distress.     Breath sounds: Normal breath sounds. No stridor. No wheezing or rales.  Chest:     Chest wall: No tenderness.  Lymphadenopathy:     Cervical: No cervical adenopathy.  Neurological:     Mental Status: She is alert and oriented to person, place, and time.     Cranial Nerves: No cranial nerve deficit.     Gait: Gait normal.  Psychiatric:        Mood and Affect: Mood normal.        Behavior: Behavior normal.        Thought Content: Thought content normal.        Judgment: Judgment normal.     Labs reviewed: Recent Labs    09/08/17 0828  NA 138  K 4.4  CL 104  CO2 24  GLUCOSE 120*  BUN 11  CREATININE 0.88  CALCIUM 9.9   Recent Labs    09/08/17 0828  AST 25  ALT 27  BILITOT 0.6  PROT 6.9   Recent Labs    09/08/17 0828  WBC 7.0  NEUTROABS 3,857  HGB 14.5  HCT 42.7  MCV 89.1  PLT 307   No results found for: TSH Lab Results  Component Value Date   HGBA1C 6.4 (H) 09/08/2017   Lab Results  Component Value Date   CHOL 158 09/08/2017   HDL 37 (L) 09/08/2017   LDLCALC 100 (H) 09/08/2017   TRIG 109 09/08/2017   CHOLHDL 4.3 09/08/2017    Significant Diagnostic Results in last 30 days:  No results  found.  Assessment/Plan  Impacted cerumen of left ear Afebrile.left ear large dry cerumen impeded in left ear.no bleeding or drainage noted. Debrox 6.5 % instil 5 drops into left ear twice daily x 4 days then follow up for ear lavage.Avoid uses of Q-tip to prevent logging cerumen or causing injury in the ears.   Family/ staff Communication: Reviewed plan of care with patient   Labs/tests ordered: None  Follow up: 06/05/2018   Sandrea Hughs, NP

## 2018-05-31 NOTE — Patient Instructions (Signed)
Debrox 6.5% otic solution instil 5 drops into left ear twice daily x 4 days then follow up for ear lavage.

## 2018-06-05 DIAGNOSIS — M5136 Other intervertebral disc degeneration, lumbar region: Secondary | ICD-10-CM | POA: Diagnosis not present

## 2018-06-05 DIAGNOSIS — M9903 Segmental and somatic dysfunction of lumbar region: Secondary | ICD-10-CM | POA: Diagnosis not present

## 2018-06-05 DIAGNOSIS — M9904 Segmental and somatic dysfunction of sacral region: Secondary | ICD-10-CM | POA: Diagnosis not present

## 2018-06-05 DIAGNOSIS — M9905 Segmental and somatic dysfunction of pelvic region: Secondary | ICD-10-CM | POA: Diagnosis not present

## 2018-06-09 ENCOUNTER — Encounter: Payer: Self-pay | Admitting: Family

## 2018-06-09 ENCOUNTER — Ambulatory Visit (INDEPENDENT_AMBULATORY_CARE_PROVIDER_SITE_OTHER): Payer: Medicare Other | Admitting: Family

## 2018-06-09 VITALS — BP 118/80 | HR 83 | Temp 97.3°F | Ht 67.0 in | Wt 170.6 lb

## 2018-06-09 DIAGNOSIS — M26609 Unspecified temporomandibular joint disorder, unspecified side: Secondary | ICD-10-CM

## 2018-06-09 DIAGNOSIS — H6122 Impacted cerumen, left ear: Secondary | ICD-10-CM

## 2018-06-09 NOTE — Progress Notes (Signed)
Provider: Ketura Sirek FNP-C  Gayland Curry, DO  Patient Care Team: Gayland Curry, DO as PCP - General (Geriatric Medicine)  Extended Emergency Contact Information Primary Emergency Contact: Guidone,Thomas Address: [address], [place] Phone: [phone] Relation: Brother  Goals of care: Advanced Directive information Advanced Directives 06/09/2018  Does Patient Have a Medical Advance Directive? Yes  Type of Advance Directive [place]  Does patient want to make changes to medical advance directive? No - Patient declined  Copy of [place] in Chart? Yes - validated most recent copy scanned in chart (See row information)  Would patient like information on creating a medical advance directive? -     Chief Complaint  Patient presents with  . Acute Visit    Impacted left Ear     HPI:  Pt is a 73 y.o. female seen today for an acute visit for evaluation of left ear cerumen impaction.she has used debrox otic solution as directed now here for ear lavage.she states saw the dentist yesterday who recommended for DR.Reed to refer her for TMJ specialist.she states pain worsening with jaw movement.she denies any fever,chills or ear pain.    Past Medical History:  Diagnosis Date  . Acute upper respiratory infections of unspecified site   . Allergic rhinitis due to pollen   . Benign essential hypertension   . Cervical spondylosis without myelopathy   . Cervicalgia   . Diaphragmatic hernia without mention of obstruction or gangrene   . Disorder of bone and cartilage, unspecified   . Diverticulosis of colon (without mention of hemorrhage)   . Encounter for long-term (current) use of other medications   . GERD (gastroesophageal reflux disease)   . History of hiatal hernia   . Hyperlipidemia LDL goal < 100   . 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 Other and unspecified  hyperlipidemia   . Pain in joint, site unspecified   . Pre-diabetes   . Routine general medical examination at a health care facility   . Sjogren's syndrome ([place]) 10/27/2012  . Tear film insufficiency, unspecified   . Unspecified disorder of kidney and ureter   . Unspecified glaucoma(365.9)    Past Surgical History:  Procedure Laterality Date  . BELPHAROPTOSIS REPAIR     brow lift   bil  . CATARACT EXTRACTION W/ INTRAOCULAR LENS  IMPLANT, BILATERAL Bilateral 12/2014   Dr. Prudencio Burly  . [place]   . DILATION AND CURETTAGE OF UTERUS    . LUMBAR LAMINECTOMY/DECOMPRESSION MICRODISCECTOMY N/A 04/21/2017   Procedure: BILATERAL LUMBAR FOUR- LUMBAR FIVE AND LEFT LUMBAR FIVE- SACRAL ONE LAMINOTOMY, MEDIAL FACETECTOMY AND FORAMINOTOMIES;  Surgeon: Jovita Gamma, MD;  Location: [facility];  Service: Neurosurgery;  Laterality: N/A;  . NASAL SEPTUM SURGERY      Allergies  Allergen Reactions  . Statins Other (See Comments)    Muscle problems  . Allopurinol Rash  . Darvocet [Propoxyphene N-Acetaminophen] Rash    Outpatient Encounter Medications as of 06/09/2018  Medication Sig  . b complex vitamins tablet Take 1 tablet by mouth daily.  . brinzolamide (AZOPT) 1 % ophthalmic suspension Place 1 drop into both eyes 2 (two) times daily.  . calcium citrate-vitamin D (CITRACAL+D) 315-200 MG-UNIT per tablet Take 1 tablet by mouth 4 (four) times daily.  . cholecalciferol (VITAMIN D) 1000 UNITS tablet Take 1,000 Units by mouth daily.   Marland Kitchen  fenofibrate (TRICOR) 145 MG tablet TAKE 1 TABLET BY MOUTH EVERY DAY  . losartan (COZAAR) 50 MG tablet TAKE 1 TABLET BY MOUTH ONCE DAILY FOR BLOOD PRESSURE  . omeprazole (PRILOSEC) 20 MG capsule Take 20 mg by mouth daily.   . Plant Sterols and Stanols (CHOLEST OFF PO) Take 1 tablet by mouth 3 (three) times daily.    No facility-administered encounter medications on file as of 06/09/2018.     Review of Systems  Constitutional: Negative for chills,  fatigue and fever.  HENT: Negative for congestion, rhinorrhea, sinus pressure, sinus pain, sneezing and sore throat.   Eyes: Negative for pain, discharge, redness and itching.  Respiratory: Negative for cough, chest tightness, shortness of breath and wheezing.   Cardiovascular: Negative for chest pain, palpitations and leg swelling.  Neurological: Negative for dizziness, light-headedness and headaches.    Immunization History  Administered Date(s) Administered  . Influenza, High Dose Seasonal PF 01/03/2017, 03/16/2018  . Influenza,inj,Quad PF,6+ Mos 12/27/2014, 01/15/2016  . Influenza-Unspecified 01/10/2008, 02/15/2011, 01/20/2012  . Pneumococcal Conjugate-13 11/01/2013  . Pneumococcal Polysaccharide-23 09/14/2010, 09/12/2017  . Tdap 04/12/2005  . Zoster 04/12/2005   Pertinent  Health Maintenance Due  Topic Date Due  . MAMMOGRAM  06/09/2019  . INFLUENZA VACCINE  Completed  . DEXA SCAN  Completed  . PNA vac Low Risk Adult  Completed   Fall Risk  06/09/2018 05/31/2018 03/16/2018 09/12/2017 05/12/2017  Falls in the past year? 0 0 0 No No  Number falls in past yr: 0 0 0 - -  Injury with Fall? 0 0 0 - -  Risk for fall due to : - - - - -  Follow up - - - - -    Vitals:   06/09/18 0950  BP: 118/80  Pulse: 83  Temp: (!) 97.3 F (36.3 C)  TempSrc: Oral  SpO2: 98%  Weight: 170 lb 9.6 oz (77.4 kg)  Height: 5\' 7"  (1.702 m)   Body mass index is 26.72 kg/m. Physical Exam Vitals signs reviewed.  Constitutional:      General: She is not in acute distress.    Appearance: She is normal weight.  HENT:     Head: Normocephalic.     Jaw: Pain on movement present. No tenderness.     Right Ear: There is impacted cerumen.     Left Ear: Tympanic membrane, ear canal and external ear normal. There is no impacted cerumen.     Ears:     Comments: Right ear cerumen lavaged with warm water large amounts removed.No signs of infections or bleeding noted.     Nose: No congestion or rhinorrhea.      Mouth/Throat:     Mouth: Mucous membranes are moist.     Pharynx: Oropharynx is clear. No oropharyngeal exudate or posterior oropharyngeal erythema.  Eyes:     General:        Right eye: No discharge.        Left eye: No discharge.     Conjunctiva/sclera: Conjunctivae normal.     Pupils: Pupils are equal, round, and reactive to light.  Neck:     Musculoskeletal: Normal range of motion. No muscular tenderness.  Cardiovascular:     Rate and Rhythm: Normal rate and regular rhythm.     Pulses: Normal pulses.     Heart sounds: Normal heart sounds. No murmur. No friction rub. No gallop.   Pulmonary:     Effort: Pulmonary effort is normal. No respiratory distress.  Breath sounds: Normal breath sounds. No wheezing, rhonchi or rales.  Chest:     Chest wall: No tenderness.  Lymphadenopathy:     Cervical: No cervical adenopathy.  Skin:    General: Skin is warm and dry.     Coloration: Skin is not pale.     Findings: No erythema.  Neurological:     Mental Status: She is alert and oriented to person, place, and time.     Cranial Nerves: No cranial nerve deficit.     Sensory: No sensory deficit.     Motor: No weakness.     Coordination: Coordination normal.     Gait: Gait normal.  Psychiatric:        Mood and Affect: Mood normal.        Behavior: Behavior normal.        Thought Content: Thought content normal.        Judgment: Judgment normal.     Labs reviewed: Recent Labs    09/08/17 0828  NA 138  K 4.4  CL 104  CO2 24  GLUCOSE 120*  BUN 11  CREATININE 0.88  CALCIUM 9.9   Recent Labs    09/08/17 0828  AST 25  ALT 27  BILITOT 0.6  PROT 6.9   Recent Labs    09/08/17 0828  WBC 7.0  NEUTROABS 3,857  HGB 14.5  HCT 42.7  MCV 89.1  PLT 307   No results found for: TSH Lab Results  Component Value Date   HGBA1C 6.4 (H) 09/08/2017   Lab Results  Component Value Date   CHOL 158 09/08/2017   HDL 37 (L) 09/08/2017   LDLCALC 100 (H) 09/08/2017   TRIG 109  09/08/2017   CHOLHDL 4.3 09/08/2017    Significant Diagnostic Results in last 30 days:  No results found.  Assessment/Plan 1. Impacted cerumen of left ear Left ear lavaged with warm water large amounts of cerumen removed.patient tolerated procedure well.  2. TMJ   Pain with movement of jaw worsening per patient.None tender on palpation.recommend OTC analgesic for now.will refer to dentist for evaluation and possible referral to oral surgeon for further evaluation.   Family/ staff Communication: Reviewed plan of care with patient  Labs/tests ordered: None   Jona Erkkila C Amr Sturtevant, NP

## 2018-06-12 DIAGNOSIS — Z1231 Encounter for screening mammogram for malignant neoplasm of breast: Secondary | ICD-10-CM | POA: Diagnosis not present

## 2018-06-12 LAB — HM MAMMOGRAPHY

## 2018-06-13 ENCOUNTER — Encounter: Payer: Self-pay | Admitting: *Deleted

## 2018-06-15 DIAGNOSIS — M9903 Segmental and somatic dysfunction of lumbar region: Secondary | ICD-10-CM | POA: Diagnosis not present

## 2018-06-15 DIAGNOSIS — M5136 Other intervertebral disc degeneration, lumbar region: Secondary | ICD-10-CM | POA: Diagnosis not present

## 2018-06-15 DIAGNOSIS — M9905 Segmental and somatic dysfunction of pelvic region: Secondary | ICD-10-CM | POA: Diagnosis not present

## 2018-06-15 DIAGNOSIS — M9904 Segmental and somatic dysfunction of sacral region: Secondary | ICD-10-CM | POA: Diagnosis not present

## 2018-06-27 DIAGNOSIS — M9904 Segmental and somatic dysfunction of sacral region: Secondary | ICD-10-CM | POA: Diagnosis not present

## 2018-06-27 DIAGNOSIS — M9905 Segmental and somatic dysfunction of pelvic region: Secondary | ICD-10-CM | POA: Diagnosis not present

## 2018-06-27 DIAGNOSIS — M5136 Other intervertebral disc degeneration, lumbar region: Secondary | ICD-10-CM | POA: Diagnosis not present

## 2018-06-27 DIAGNOSIS — M9903 Segmental and somatic dysfunction of lumbar region: Secondary | ICD-10-CM | POA: Diagnosis not present

## 2018-07-13 DIAGNOSIS — M9901 Segmental and somatic dysfunction of cervical region: Secondary | ICD-10-CM | POA: Diagnosis not present

## 2018-07-13 DIAGNOSIS — M50322 Other cervical disc degeneration at C5-C6 level: Secondary | ICD-10-CM | POA: Diagnosis not present

## 2018-07-13 DIAGNOSIS — M5136 Other intervertebral disc degeneration, lumbar region: Secondary | ICD-10-CM | POA: Diagnosis not present

## 2018-07-13 DIAGNOSIS — M9903 Segmental and somatic dysfunction of lumbar region: Secondary | ICD-10-CM | POA: Diagnosis not present

## 2018-07-26 DIAGNOSIS — M9901 Segmental and somatic dysfunction of cervical region: Secondary | ICD-10-CM | POA: Diagnosis not present

## 2018-07-26 DIAGNOSIS — M50322 Other cervical disc degeneration at C5-C6 level: Secondary | ICD-10-CM | POA: Diagnosis not present

## 2018-07-26 DIAGNOSIS — M9903 Segmental and somatic dysfunction of lumbar region: Secondary | ICD-10-CM | POA: Diagnosis not present

## 2018-07-26 DIAGNOSIS — M5136 Other intervertebral disc degeneration, lumbar region: Secondary | ICD-10-CM | POA: Diagnosis not present

## 2018-08-08 DIAGNOSIS — M9901 Segmental and somatic dysfunction of cervical region: Secondary | ICD-10-CM | POA: Diagnosis not present

## 2018-08-08 DIAGNOSIS — M50322 Other cervical disc degeneration at C5-C6 level: Secondary | ICD-10-CM | POA: Diagnosis not present

## 2018-08-08 DIAGNOSIS — M9903 Segmental and somatic dysfunction of lumbar region: Secondary | ICD-10-CM | POA: Diagnosis not present

## 2018-08-08 DIAGNOSIS — M5136 Other intervertebral disc degeneration, lumbar region: Secondary | ICD-10-CM | POA: Diagnosis not present

## 2018-08-22 DIAGNOSIS — M9903 Segmental and somatic dysfunction of lumbar region: Secondary | ICD-10-CM | POA: Diagnosis not present

## 2018-08-22 DIAGNOSIS — M5136 Other intervertebral disc degeneration, lumbar region: Secondary | ICD-10-CM | POA: Diagnosis not present

## 2018-08-22 DIAGNOSIS — M50322 Other cervical disc degeneration at C5-C6 level: Secondary | ICD-10-CM | POA: Diagnosis not present

## 2018-08-22 DIAGNOSIS — M9901 Segmental and somatic dysfunction of cervical region: Secondary | ICD-10-CM | POA: Diagnosis not present

## 2018-09-05 DIAGNOSIS — M50322 Other cervical disc degeneration at C5-C6 level: Secondary | ICD-10-CM | POA: Diagnosis not present

## 2018-09-05 DIAGNOSIS — M9901 Segmental and somatic dysfunction of cervical region: Secondary | ICD-10-CM | POA: Diagnosis not present

## 2018-09-05 DIAGNOSIS — M5136 Other intervertebral disc degeneration, lumbar region: Secondary | ICD-10-CM | POA: Diagnosis not present

## 2018-09-05 DIAGNOSIS — M9903 Segmental and somatic dysfunction of lumbar region: Secondary | ICD-10-CM | POA: Diagnosis not present

## 2018-09-08 ENCOUNTER — Other Ambulatory Visit: Payer: Self-pay

## 2018-09-08 DIAGNOSIS — H524 Presbyopia: Secondary | ICD-10-CM | POA: Diagnosis not present

## 2018-09-08 DIAGNOSIS — E782 Mixed hyperlipidemia: Secondary | ICD-10-CM

## 2018-09-08 DIAGNOSIS — R739 Hyperglycemia, unspecified: Secondary | ICD-10-CM

## 2018-09-08 DIAGNOSIS — I10 Essential (primary) hypertension: Secondary | ICD-10-CM

## 2018-09-08 DIAGNOSIS — Z1159 Encounter for screening for other viral diseases: Secondary | ICD-10-CM

## 2018-09-08 DIAGNOSIS — H52203 Unspecified astigmatism, bilateral: Secondary | ICD-10-CM | POA: Diagnosis not present

## 2018-09-08 DIAGNOSIS — H401132 Primary open-angle glaucoma, bilateral, moderate stage: Secondary | ICD-10-CM | POA: Diagnosis not present

## 2018-09-11 ENCOUNTER — Other Ambulatory Visit: Payer: Self-pay

## 2018-09-11 ENCOUNTER — Other Ambulatory Visit: Payer: Medicare Other

## 2018-09-11 DIAGNOSIS — I10 Essential (primary) hypertension: Secondary | ICD-10-CM | POA: Diagnosis not present

## 2018-09-11 DIAGNOSIS — R739 Hyperglycemia, unspecified: Secondary | ICD-10-CM | POA: Diagnosis not present

## 2018-09-11 DIAGNOSIS — Z1159 Encounter for screening for other viral diseases: Secondary | ICD-10-CM | POA: Diagnosis not present

## 2018-09-11 DIAGNOSIS — E782 Mixed hyperlipidemia: Secondary | ICD-10-CM | POA: Diagnosis not present

## 2018-09-12 LAB — HEMOGLOBIN A1C
Hgb A1c MFr Bld: 6.3 % of total Hgb — ABNORMAL HIGH (ref <5.7)
Mean Plasma Glucose: 134 (calc)
eAG (mmol/L): 7.4 (calc)

## 2018-09-12 LAB — CBC WITH DIFFERENTIAL/PLATELET
Absolute Monocytes: 540 cells/uL (ref 200–950)
Basophils Absolute: 98 cells/uL (ref 0–200)
Basophils Relative: 1.5 %
Eosinophils Absolute: 351 cells/uL (ref 15–500)
Eosinophils Relative: 5.4 %
HCT: 45.2 % — ABNORMAL HIGH (ref 35.0–45.0)
Hemoglobin: 14.9 g/dL (ref 11.7–15.5)
Lymphs Abs: 1937 cells/uL (ref 850–3900)
MCH: 29.9 pg (ref 27.0–33.0)
MCHC: 33 g/dL (ref 32.0–36.0)
MCV: 90.8 fL (ref 80.0–100.0)
MPV: 10.8 fL (ref 7.5–12.5)
Monocytes Relative: 8.3 %
Neutro Abs: 3575 cells/uL (ref 1500–7800)
Neutrophils Relative %: 55 %
Platelets: 304 10*3/uL (ref 140–400)
RBC: 4.98 10*6/uL (ref 3.80–5.10)
RDW: 12.4 % (ref 11.0–15.0)
Total Lymphocyte: 29.8 %
WBC: 6.5 10*3/uL (ref 3.8–10.8)

## 2018-09-12 LAB — COMPLETE METABOLIC PANEL WITH GFR
AG Ratio: 1.9 (calc) (ref 1.0–2.5)
ALT: 23 U/L (ref 6–29)
AST: 25 U/L (ref 10–35)
Albumin: 4.6 g/dL (ref 3.6–5.1)
Alkaline phosphatase (APISO): 44 U/L (ref 37–153)
BUN/Creatinine Ratio: 14 (calc) (ref 6–22)
BUN: 15 mg/dL (ref 7–25)
CO2: 26 mmol/L (ref 20–32)
Calcium: 10.3 mg/dL (ref 8.6–10.4)
Chloride: 103 mmol/L (ref 98–110)
Creat: 1.06 mg/dL — ABNORMAL HIGH (ref 0.60–0.93)
GFR, Est African American: 61 mL/min/{1.73_m2} (ref >=60)
GFR, Est Non African American: 52 mL/min/{1.73_m2} — ABNORMAL LOW (ref >=60)
Globulin: 2.4 g/dL (calc) (ref 1.9–3.7)
Glucose, Bld: 107 mg/dL — ABNORMAL HIGH (ref 65–99)
Potassium: 5 mmol/L (ref 3.5–5.3)
Sodium: 137 mmol/L (ref 135–146)
Total Bilirubin: 0.6 mg/dL (ref 0.2–1.2)
Total Protein: 7 g/dL (ref 6.1–8.1)

## 2018-09-12 LAB — LIPID PANEL
Cholesterol: 170 mg/dL (ref <200)
HDL: 43 mg/dL — ABNORMAL LOW (ref >=50)
LDL Cholesterol (Calc): 106 mg/dL (calc) — ABNORMAL HIGH
Non-HDL Cholesterol (Calc): 127 mg/dL (calc) (ref <130)
Total CHOL/HDL Ratio: 4 (calc) (ref <5.0)
Triglycerides: 117 mg/dL (ref <150)

## 2018-09-12 LAB — HEPATITIS C ANTIBODY
Hepatitis C Ab: NONREACTIVE
SIGNAL TO CUT-OFF: 0.01 (ref <1.00)

## 2018-09-15 ENCOUNTER — Ambulatory Visit: Payer: Self-pay

## 2018-09-18 DIAGNOSIS — M50322 Other cervical disc degeneration at C5-C6 level: Secondary | ICD-10-CM | POA: Diagnosis not present

## 2018-09-18 DIAGNOSIS — M9903 Segmental and somatic dysfunction of lumbar region: Secondary | ICD-10-CM | POA: Diagnosis not present

## 2018-09-18 DIAGNOSIS — M5136 Other intervertebral disc degeneration, lumbar region: Secondary | ICD-10-CM | POA: Diagnosis not present

## 2018-09-18 DIAGNOSIS — M9901 Segmental and somatic dysfunction of cervical region: Secondary | ICD-10-CM | POA: Diagnosis not present

## 2018-09-20 ENCOUNTER — Ambulatory Visit (INDEPENDENT_AMBULATORY_CARE_PROVIDER_SITE_OTHER): Payer: Medicare Other | Admitting: Family

## 2018-09-20 ENCOUNTER — Other Ambulatory Visit: Payer: Self-pay

## 2018-09-20 ENCOUNTER — Encounter: Payer: Self-pay | Admitting: Family

## 2018-09-20 DIAGNOSIS — Z0000 Encounter for general adult medical examination without abnormal findings: Secondary | ICD-10-CM

## 2018-09-20 NOTE — Patient Instructions (Signed)
Felicia Hall , Thank you for taking time to come for your Medicare Wellness Visit. I appreciate your ongoing commitment to your health goals. Please review the following plan we discussed and let me know if I can assist you in the future.   Screening recommendations/referrals: Colonoscopy: Cologuard due 02/29/2020 Mammogram: Up to date  Bone Density: Up to date  Recommended yearly ophthalmology/optometry visit for glaucoma screening and checkup Recommended yearly dental visit for hygiene and checkup  Vaccinations: Influenza vaccine: Up to date  Pneumococcal vaccine: Up to date  Tdap vaccine: Up to date due 09/13/2019  Shingles vaccine: second injection due this month.   Advanced directives: Yes   Conditions/risks identified: Advance Age female >6 years,Hypertension,Hyperlipidemia   Next appointment: 1 year   Preventive Care 73 Years and Older, Female Preventive care refers to lifestyle choices and visits with your health care provider that can promote health and wellness. What does preventive care include?  A yearly physical exam. This is also called an annual well check.  Dental exams once or twice a year.  Routine eye exams. Ask your health care provider how often you should have your eyes checked.  Personal lifestyle choices, including:  Daily care of your teeth and gums.  Regular physical activity.  Eating a healthy diet.  Avoiding tobacco and drug use.  Limiting alcohol use.  Practicing safe sex.  Taking low-dose aspirin every day.  Taking vitamin and mineral supplements as recommended by your health care provider. What happens during an annual well check? The services and screenings done by your health care provider during your annual well check will depend on your age, overall health, lifestyle risk factors, and family history of disease. Counseling  Your health care provider may ask you questions about your:  Alcohol use.  Tobacco use.  Drug use.   Emotional well-being.  Home and relationship well-being.  Sexual activity.  Eating habits.  History of falls.  Memory and ability to understand (cognition).  Work and work Statistician.  Reproductive health. Screening  You may have the following tests or measurements:  Height, weight, and BMI.  Blood pressure.  Lipid and cholesterol levels. These may be checked every 5 years, or more frequently if you are over 73 years old.  Skin check.  Lung cancer screening. You may have this screening every year starting at age 73 if you have a 30-pack-year history of smoking and currently smoke or have quit within the past 15 years.  Fecal occult blood test (FOBT) of the stool. You may have this test every year starting at age 73.  Flexible sigmoidoscopy or colonoscopy. You may have a sigmoidoscopy every 5 years or a colonoscopy every 10 years starting at age 73.  Hepatitis C blood test.  Hepatitis B blood test.  Sexually transmitted disease (STD) testing.  Diabetes screening. This is done by checking your blood sugar (glucose) after you have not eaten for a while (fasting). You may have this done every 1-3 years.  Bone density scan. This is done to screen for osteoporosis. You may have this done starting at age 73.  Mammogram. This may be done every 1-2 years. Talk to your health care provider about how often you should have regular mammograms. Talk with your health care provider about your test results, treatment options, and if necessary, the need for more tests. Vaccines  Your health care provider may recommend certain vaccines, such as:  Influenza vaccine. This is recommended every year.  Tetanus, diphtheria, and acellular pertussis (  Tdap, Td) vaccine. You may need a Td booster every 10 years.  Zoster vaccine. You may need this after age 12.  Pneumococcal 13-valent conjugate (PCV13) vaccine. One dose is recommended after age 73.  Pneumococcal polysaccharide (PPSV23)  vaccine. One dose is recommended after age 73. Talk to your health care provider about which screenings and vaccines you need and how often you need them. This information is not intended to replace advice given to you by your health care provider. Make sure you discuss any questions you have with your health care provider. Document Released: 04/25/2015 Document Revised: 12/17/2015 Document Reviewed: 01/28/2015 Elsevier Interactive Patient Education  2017 [employer] Prevention in the Home Falls can cause injuries. They can happen to people of all ages. There are many things you can do to make your home safe and to help prevent falls. What can I do on the outside of my home?  Regularly fix the edges of walkways and driveways and fix any cracks.  Remove anything that might make you trip as you walk through a door, such as a raised step or threshold.  Trim any bushes or trees on the path to your home.  Use bright outdoor lighting.  Clear any walking paths of anything that might make someone trip, such as rocks or tools.  Regularly check to see if handrails are loose or broken. Make sure that both sides of any steps have handrails.  Any raised decks and porches should have guardrails on the edges.  Have any leaves, snow, or ice cleared regularly.  Use sand or salt on walking paths during winter.  Clean up any spills in your garage right away. This includes oil or grease spills. What can I do in the bathroom?  Use night lights.  Install grab bars by the toilet and in the tub and shower. Do not use towel bars as grab bars.  Use non-skid mats or decals in the tub or shower.  If you need to sit down in the shower, use a plastic, non-slip stool.  Keep the floor dry. Clean up any water that spills on the floor as soon as it happens.  Remove soap buildup in the tub or shower regularly.  Attach bath mats securely with double-sided non-slip rug tape.  Do not have throw rugs  and other things on the floor that can make you trip. What can I do in the bedroom?  Use night lights.  Make sure that you have a light by your bed that is easy to reach.  Do not use any sheets or blankets that are too big for your bed. They should not hang down onto the floor.  Have a firm chair that has side arms. You can use this for support while you get dressed.  Do not have throw rugs and other things on the floor that can make you trip. What can I do in the kitchen?  Clean up any spills right away.  Avoid walking on wet floors.  Keep items that you use a lot in easy-to-reach places.  If you need to reach something above you, use a strong step stool that has a grab bar.  Keep electrical cords out of the way.  Do not use floor polish or wax that makes floors slippery. If you must use wax, use non-skid floor wax.  Do not have throw rugs and other things on the floor that can make you trip. What can I do with my stairs?  Do  not leave any items on the stairs.  Make sure that there are handrails on both sides of the stairs and use them. Fix handrails that are broken or loose. Make sure that handrails are as long as the stairways.  Check any carpeting to make sure that it is firmly attached to the stairs. Fix any carpet that is loose or worn.  Avoid having throw rugs at the top or bottom of the stairs. If you do have throw rugs, attach them to the floor with carpet tape.  Make sure that you have a light switch at the top of the stairs and the bottom of the stairs. If you do not have them, ask someone to add them for you. What else can I do to help prevent falls?  Wear shoes that:  Do not have high heels.  Have rubber bottoms.  Are comfortable and fit you well.  Are closed at the toe. Do not wear sandals.  If you use a stepladder:  Make sure that it is fully opened. Do not climb a closed stepladder.  Make sure that both sides of the stepladder are locked into  place.  Ask someone to hold it for you, if possible.  Clearly mark and make sure that you can see:  Any grab bars or handrails.  First and last steps.  Where the edge of each step is.  Use tools that help you move around (mobility aids) if they are needed. These include:  Canes.  Walkers.  Scooters.  Crutches.  Turn on the lights when you go into a dark area. Replace any light bulbs as soon as they burn out.  Set up your furniture so you have a clear path. Avoid moving your furniture around.  If any of your floors are uneven, fix them.  If there are any pets around you, be aware of where they are.  Review your medicines with your doctor. Some medicines can make you feel dizzy. This can increase your chance of falling. Ask your doctor what other things that you can do to help prevent falls. This information is not intended to replace advice given to you by your health care provider. Make sure you discuss any questions you have with your health care provider. Document Released: 01/23/2009 Document Revised: 09/04/2015 Document Reviewed: 05/03/2014 Elsevier Interactive Patient Education  2017 [employer].

## 2018-09-20 NOTE — Progress Notes (Signed)
  This service is provided via telemedicine  No vital signs collected/recorded due to the encounter was a telemedicine visit.   Location of patient (ex: home, work):  Home  Patient consents to a telephone visit:  Yes  Location of the provider (ex: office, home):  Office  Name of any referring provider:  Dr. Mariea Clonts  Names of all persons participating in the telemedicine service and their role in the encounter:  Rodena Piety, CMA, Harmon Pier, patient and Webb Silversmith, NP  Time spent on call:  9:47

## 2018-09-20 NOTE — Progress Notes (Signed)
Subjective:   Felicia Hall is a 73 y.o. female who presents for Medicare Annual (Subsequent) preventive examination.  Review of Systems:   Cardiac Risk Factors include: advanced age (>33men, >85 women);hypertension;dyslipidemia     Objective:     Vitals: There were no vitals taken for this visit.  There is no height or weight on file to calculate BMI.  Advanced Directives 09/20/2018 06/09/2018 05/31/2018 03/16/2018 09/12/2017 04/19/2017 08/06/2016  Does Patient Have a Medical Advance Directive? Yes Yes Yes Yes No Yes Yes  Type of Paramedic of [place];Living will Healthcare Power of [place] of [place];Living will - Living will [place];Living will  Does patient want to make changes to medical advance directive? No - Patient declined No - Patient declined No - Patient declined No - Patient declined - - No - Patient declined  Copy of [place] in Chart? - Yes - validated most recent copy scanned in chart (See row information) Yes - validated most recent copy scanned in chart (See row information) No - copy requested - - No - copy requested  Would patient like information on creating a medical advance directive? - - - - Yes (MAU/Ambulatory/Procedural Areas - Information given) - -    Tobacco Social History   Tobacco Use  Smoking Status Never Smoker  Smokeless Tobacco Never Used     Counseling given: Not Answered  Clinical Intake:  Pre-visit preparation completed: No  Pain : 0-10 Pain Score: 4  Pain Type: Chronic pain Pain Location: Back Pain Orientation: Lower Pain Radiating Towards: aleve  Pain Descriptors / Indicators: Aching Pain Onset: Other (comment)(several years ) Pain Frequency: Constant Pain Relieving Factors: Aleve  Effect of Pain on Daily Activities: yes heard to do axctivities that require twisted   Pain Relieving Factors: Aleve   BMI - recorded: 26.72  Nutritional Status: BMI 25 -29 Overweight Nutritional Risks: None Diabetes: No  How often do you need to have someone help you when you read instructions, pamphlets, or other written materials from your doctor or pharmacy?: 1 - Never What is the last grade level you completed in school?: Graduate school   Interpreter Needed?: No  Information entered by :: Shyteria Lewis FNP-C   Past Medical History:  Diagnosis Date  . Acute upper respiratory infections of unspecified site   . Allergic rhinitis due to pollen   . Benign essential hypertension   . Cervical spondylosis without myelopathy   . Cervicalgia   . Diaphragmatic hernia without mention of obstruction or gangrene   . Disorder of bone and cartilage, unspecified   . Diverticulosis of colon (without mention of hemorrhage)   . Encounter for long-term (current) use of other medications   . GERD (gastroesophageal reflux disease)   . History of hiatal hernia   . Hyperlipidemia LDL goal < 100   . 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 Other and unspecified hyperlipidemia   . Pain in joint, site unspecified   . Pre-diabetes   . Routine general medical examination at a health care facility   . Sjogren's syndrome ([place]) 10/27/2012  . Tear film insufficiency, unspecified   . Unspecified disorder of kidney and ureter   . Unspecified glaucoma(365.9)    Past Surgical History:  Procedure Laterality Date  . BELPHAROPTOSIS REPAIR     brow lift   bil  . CATARACT EXTRACTION W/ INTRAOCULAR LENS  IMPLANT, BILATERAL Bilateral 12/2014   Dr.  Hartley   Dr.Lindsey   . DILATION AND CURETTAGE OF UTERUS    . LUMBAR LAMINECTOMY/DECOMPRESSION MICRODISCECTOMY N/A 04/21/2017   Procedure: BILATERAL LUMBAR FOUR- LUMBAR FIVE AND LEFT LUMBAR FIVE- SACRAL ONE LAMINOTOMY, MEDIAL FACETECTOMY AND FORAMINOTOMIES;  Surgeon: Jovita Gamma, MD;  Location: [facility];  Service: Neurosurgery;  Laterality: N/A;  . NASAL SEPTUM  SURGERY     Family History  Problem Relation Age of Onset  . Heart attack Mother   . Breast cancer Mother   . Pancreatitis Father   . Heart disease Brother        By-pass surgery  . Lupus Cousin    Social History   Socioeconomic History  . Marital status: Single    Spouse name: Not on file  . Number of children: Not on file  . Years of education: Not on file  . Highest education level: Not on file  Occupational History  . Occupation: Herbalist  Social Needs  . Financial resource strain: Not hard at all  . Food insecurity:    Worry: Never true    Inability: Never true  . Transportation needs:    Medical: No    Non-medical: No  Tobacco Use  . Smoking status: Never Smoker  . Smokeless tobacco: Never Used  Substance and Sexual Activity  . Alcohol use: Yes    Alcohol/week: 0.0 standard drinks    Comment: once a year  . Drug use: No  . Sexual activity: Not on file  Lifestyle  . Physical activity:    Days per week: 7 days    Minutes per session: 40 min  . Stress: Rather much  Relationships  . Social connections:    Talks on phone: Three times a week    Gets together: Three times a week    Attends religious service: Never    Active member of club or organization: No    Attends meetings of clubs or organizations: Never    Relationship status: Never married  Other Topics Concern  . Not on file  Social History Narrative   Single   No children   Never smoked   Alcohol none   Exercise -stretches, hand weights     Outpatient Encounter Medications as of 09/20/2018  Medication Sig  . b complex vitamins tablet Take 1 tablet by mouth daily.  . brinzolamide (AZOPT) 1 % ophthalmic suspension Place 1 drop into both eyes 2 (two) times daily.  . calcium citrate-vitamin D (CITRACAL+D) 315-200 MG-UNIT per tablet Take 1 tablet by mouth 4 (four) times daily.  . cholecalciferol (VITAMIN D) 1000 UNITS tablet Take 1,000 Units by mouth daily.   . fenofibrate (TRICOR) 145 MG  tablet TAKE 1 TABLET BY MOUTH EVERY DAY  . levocetirizine (XYZAL ALLERGY 24HR) 5 MG tablet Take 5 mg by mouth every evening.  Marland Kitchen losartan (COZAAR) 50 MG tablet TAKE 1 TABLET BY MOUTH ONCE DAILY FOR BLOOD PRESSURE  . omeprazole (PRILOSEC) 20 MG capsule Take 20 mg by mouth daily.   . Plant Sterols and Stanols (CHOLEST OFF PO) Take 1 tablet by mouth 3 (three) times daily.    No facility-administered encounter medications on file as of 09/20/2018.     Activities of Daily Living In your present state of health, do you have any difficulty performing the following activities: 09/20/2018  Hearing? N  Vision? N  Difficulty concentrating or making decisions? Y  Comment remembering   Walking or climbing stairs? Y  Comment knee pain  Dressing or bathing? N  Doing errands, shopping? N  Preparing Food and eating ? N  Using the Toilet? N  In the past six months, have you accidently leaked urine? Y  Comment leakage with sneezing   Do you have problems with loss of bowel control? N  Managing your Medications? N  Managing your Finances? N  Housekeeping or managing your Housekeeping? N  Some recent data might be hidden    Patient Care Team: Gayland Curry, DO as PCP - General (Geriatric Medicine)    Assessment:   This is a routine wellness examination for Michel.  Exercise Activities and Dietary recommendations Current Exercise Habits: Home exercise routine, Type of exercise: strength training/weights;Other - see comments(Riding stationary bike ), Time (Minutes): 35, Frequency (Times/Week): 7, Weekly Exercise (Minutes/Week): 245, Intensity: Moderate  Goals    . Weight (lb) < 160 lb (72.6 kg)     Starting 08/07/2016 I would like to lose weight by snacking less.       Fall Risk Fall Risk  09/20/2018 06/09/2018 05/31/2018 03/16/2018 09/12/2017  Falls in the past year? 0 0 0 0 No  Number falls in past yr: 0 0 0 0 -  Injury with Fall? 0 0 0 0 -  Risk for fall due to : - - - - -  Follow up - - - -  -   Is the patient's home free of loose throw rugs in walkways, pet beds, electrical cords, etc?   yes      Grab bars in the bathroom? yes      Handrails on the stairs?  No stairs       Adequate lighting?   yes  Depression Screen PHQ 2/9 Scores 09/20/2018 03/16/2018 09/12/2017 05/12/2017  PHQ - 2 Score 0 0 1 0  PHQ- 9 Score - - - -     Cognitive Function MMSE - Mini Mental State Exam 09/12/2017 08/06/2016 05/09/2015  Orientation to time 5 5 5   Orientation to Place 5 5 5   Registration 3 3 3   Attention/ Calculation 5 5 5   Recall 3 2 3   Language- name 2 objects 2 2 2   Language- repeat 1 1 1   Language- follow 3 step command 3 3 3   Language- read & follow direction 1 1 1   Write a sentence 1 1 1   Copy design 1 1 1   Total score 30 29 30      6CIT Screen 09/20/2018  What Year? 0 points  What month? 0 points  What time? 0 points  Count back from 20 0 points  Months in reverse 0 points  Repeat phrase 0 points  Total Score 0    Immunization History  Administered Date(s) Administered  . Influenza, High Dose Seasonal PF 01/03/2017, 03/16/2018  . Influenza,inj,Quad PF,6+ Mos 12/27/2014, 01/15/2016  . Influenza-Unspecified 01/10/2008, 02/15/2011, 01/20/2012  . Pneumococcal Conjugate-13 11/01/2013  . Pneumococcal Polysaccharide-23 09/14/2010, 09/12/2017  . Tdap 04/12/2005  . Zoster 04/12/2005    Qualifies for Shingles Vaccine? Due for one more dose. scheduled for June,2020   Screening Tests Health Maintenance  Topic Date Due  . TETANUS/TDAP  09/13/2019 (Originally 04/13/2015)  . INFLUENZA VACCINE  11/11/2018  . Fecal DNA (Cologuard)  02/29/2020  . MAMMOGRAM  06/11/2020  . DEXA SCAN  Completed  . Hepatitis C Screening  Completed  . PNA vac Low Risk Adult  Completed    Cancer Screenings: Lung: Low Dose CT Chest recommended if Age 73-80 years, 30 pack-year currently smoking OR have  quit w/in 15years. Patient does not qualify. Breast:  Up to date on Mammogram? Yes   Up to date of  Bone Density/Dexa? Yes Colorectal: Cologuard 02/29/2020   Additional Screenings:  Hepatitis C Screening:Low Risk    Plan:  - Zoster vaccine 2 nd dose due June,2020.Patient aware.   I have personally reviewed and noted the following in the patient's chart:   . Medical and social history . Use of alcohol, tobacco or illicit drugs  . Current medications and supplements . Functional ability and status . Nutritional status . Physical activity . Advanced directives . List of other physicians . Hospitalizations, surgeries, and ER visits in previous 12 months . Vitals . Screenings to include cognitive, depression, and falls . Referrals and appointments  In addition, I have reviewed and discussed with patient certain preventive protocols, quality metrics, and best practice recommendations. A written personalized care plan for preventive services as well as general preventive health recommendations were provided to patient.  Sandrea Hughs, NP  09/20/2018

## 2018-09-21 ENCOUNTER — Other Ambulatory Visit: Payer: Self-pay

## 2018-09-21 ENCOUNTER — Encounter: Payer: Self-pay | Admitting: Internal Medicine

## 2018-09-21 ENCOUNTER — Ambulatory Visit (INDEPENDENT_AMBULATORY_CARE_PROVIDER_SITE_OTHER): Payer: Medicare Other | Admitting: Internal Medicine

## 2018-09-21 ENCOUNTER — Ambulatory Visit: Payer: Medicare Other | Admitting: Internal Medicine

## 2018-09-21 ENCOUNTER — Encounter: Payer: Self-pay | Admitting: Family

## 2018-09-21 VITALS — BP 120/70 | HR 79 | Temp 98.1°F | Ht 67.0 in | Wt 172.0 lb

## 2018-09-21 DIAGNOSIS — E782 Mixed hyperlipidemia: Secondary | ICD-10-CM | POA: Diagnosis not present

## 2018-09-21 DIAGNOSIS — I10 Essential (primary) hypertension: Secondary | ICD-10-CM | POA: Diagnosis not present

## 2018-09-21 DIAGNOSIS — M159 Polyosteoarthritis, unspecified: Secondary | ICD-10-CM

## 2018-09-21 DIAGNOSIS — R739 Hyperglycemia, unspecified: Secondary | ICD-10-CM

## 2018-09-21 DIAGNOSIS — Z0000 Encounter for general adult medical examination without abnormal findings: Secondary | ICD-10-CM

## 2018-09-21 DIAGNOSIS — M3501 Sicca syndrome with keratoconjunctivitis: Secondary | ICD-10-CM

## 2018-09-21 DIAGNOSIS — M26609 Unspecified temporomandibular joint disorder, unspecified side: Secondary | ICD-10-CM | POA: Diagnosis not present

## 2018-09-21 NOTE — Progress Notes (Signed)
Provider:  Rexene Edison. Mariea Clonts, D.O., C.M.D. Location:    [facility]   Place of Service:   clinic  Previous PCP: Gayland Curry, DO Patient Care Team: Gayland Curry, DO as PCP - General (Geriatric Medicine)  Extended Emergency Contact Information Primary Emergency Contact: Tukes,Thomas Address: [place], [place] Phone: [phone] Relation: Brother  Goals of Care: Advanced Directive information Advanced Directives 09/20/2018  Does Patient Have a Medical Advance Directive? Yes  Type of Paramedic of [place];Living will  Does patient want to make changes to medical advance directive? No - Patient declined  Copy of [place] in Chart? -  Would patient like information on creating a medical advance directive? -   Chief Complaint  Patient presents with  . Annual Exam    CPE    HPI: Patient is a 73 y.o. female seen today for an annual physical exam.  She has a h/o sjogren's syndrome, glaucoma, htn, cervical spondylosis without myelopathy,  osteopenia, essential tremor, lumbar stenosis with neurogenic claudication, depression, allergies and sinusitis, OA.  As far as covid isolation--she's been staying in and abiding by the state regulations.  She was excited to get her hair cut and get to the gas station and grocery store.    She's gained weight.  She thought it was 10 lbs based on her midriff--first place  It goes and last place it goes.  She was doing weights and stationary bike--she is doing the bike daily unless she feels really bad.  She's sitting around and eating a lot out of boredom.  Lipids and sugar average trended up a little.    She does not hydrate well.  She was also having dental problems after clunking a glass against her front teeth.  One of the teeth became very sensitive and she went to the dentist and had bruised the nerve.  She was using aleve twice a day for 3 days the week  before her labs as suggested by dentist.  Tooth is getting better.  Jaw has popped for years.  Right jaw now cracks loud and shifts side to side and upper and lower teeth seem to grate together.    She did ask about plasma injection in her back.   She got her first shingrix and gets second one this month.    Past Medical History:  Diagnosis Date  . Acute upper respiratory infections of unspecified site   . Allergic rhinitis due to pollen   . Benign essential hypertension   . Cervical spondylosis without myelopathy   . Cervicalgia   . Diaphragmatic hernia without mention of obstruction or gangrene   . Disorder of bone and cartilage, unspecified   . Diverticulosis of colon (without mention of hemorrhage)   . Encounter for long-term (current) use of other medications   . GERD (gastroesophageal reflux disease)   . History of hiatal hernia   . Hyperlipidemia LDL goal < 100   . 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 Other and unspecified hyperlipidemia   . Pain in joint, site unspecified   . Pre-diabetes   . Routine general medical examination at a health care facility   . Sjogren's syndrome ([place]) 10/27/2012  . Tear film insufficiency, unspecified   . Unspecified disorder of kidney and ureter   . Unspecified glaucoma(365.9)    Past Surgical History:  Procedure Laterality Date  .  BELPHAROPTOSIS REPAIR     brow lift   bil  . CATARACT EXTRACTION W/ INTRAOCULAR LENS  IMPLANT, BILATERAL Bilateral 12/2014   Dr. Prudencio Burly  . [facility]   . DILATION AND CURETTAGE OF UTERUS    . LUMBAR LAMINECTOMY/DECOMPRESSION MICRODISCECTOMY N/A 04/21/2017   Procedure: BILATERAL LUMBAR FOUR- LUMBAR FIVE AND LEFT LUMBAR FIVE- SACRAL ONE LAMINOTOMY, MEDIAL FACETECTOMY AND FORAMINOTOMIES;  Surgeon: Jovita Gamma, MD;  Location: [facility];  Service: Neurosurgery;  Laterality: N/A;  . NASAL SEPTUM SURGERY      reports that she has never smoked. She has never used  smokeless tobacco. She reports current alcohol use. She reports that she does not use drugs.  Functional Status Survey:    Family History  Problem Relation Age of Onset  . Heart attack Mother   . Breast cancer Mother   . Pancreatitis Father   . Heart disease Brother        By-pass surgery  . Lupus Cousin     Health Maintenance  Topic Date Due  . TETANUS/TDAP  09/13/2019 (Originally 04/13/2015)  . INFLUENZA VACCINE  11/11/2018  . Fecal DNA (Cologuard)  02/29/2020  . MAMMOGRAM  06/11/2020  . DEXA SCAN  Completed  . Hepatitis C Screening  Completed  . PNA vac Low Risk Adult  Completed    Allergies  Allergen Reactions  . Statins Other (See Comments)    Muscle problems  . Allopurinol Rash  . Darvocet [Propoxyphene N-Acetaminophen] Rash    Outpatient Encounter Medications as of 09/21/2018  Medication Sig  . brinzolamide (AZOPT) 1 % ophthalmic suspension Place 1 drop into both eyes 2 (two) times daily.  . calcium citrate-vitamin D (CITRACAL+D) 315-200 MG-UNIT per tablet Take 1 tablet by mouth 4 (four) times daily.  . cholecalciferol (VITAMIN D) 1000 UNITS tablet Take 1,000 Units by mouth daily.   . fenofibrate (TRICOR) 145 MG tablet TAKE 1 TABLET BY MOUTH EVERY DAY  . levocetirizine (XYZAL ALLERGY 24HR) 5 MG tablet Take 5 mg by mouth every evening.  Marland Kitchen losartan (COZAAR) 50 MG tablet TAKE 1 TABLET BY MOUTH ONCE DAILY FOR BLOOD PRESSURE  . omeprazole (PRILOSEC) 20 MG capsule Take 20 mg by mouth daily.   . Plant Sterols and Stanols (CHOLEST OFF PO) Take 1 tablet by mouth 3 (three) times daily.   . [DISCONTINUED] b complex vitamins tablet Take 1 tablet by mouth daily.   No facility-administered encounter medications on file as of 09/21/2018.     Review of Systems  Constitutional: Positive for malaise/fatigue. Negative for chills and fever.  HENT: Negative for hearing loss.        Dry mouth; TMJ pain--requests specialist  Eyes: Negative for blurred vision.       Glaucoma, dry  eyes  Respiratory: Negative for cough and shortness of breath.   Cardiovascular: Negative for chest pain, palpitations and leg swelling.  Gastrointestinal: Positive for constipation. Negative for abdominal pain, blood in stool, diarrhea and melena.  Genitourinary: Negative for dysuria.  Musculoskeletal: Positive for back pain, joint pain and neck pain. Negative for falls.  Skin: Negative for itching and rash.  Neurological: Positive for headaches. Negative for loss of consciousness.       Related to jaw pain  Endo/Heme/Allergies: Bruises/bleeds easily.  Psychiatric/Behavioral: Negative for depression and memory loss. The patient is not nervous/anxious and does not have insomnia.     Vitals:   09/21/18 1104  BP: 120/70  Pulse: 79  Temp: 98.1 F (36.7 C)  TempSrc: Oral  SpO2: 98%  Weight: 172 lb (78 kg)  Height: 5\' 7"  (1.702 m)   Body mass index is 26.94 kg/m. Physical Exam Vitals signs reviewed.  Constitutional:      General: She is not in acute distress.    Appearance: Normal appearance. She is not ill-appearing or toxic-appearing.  HENT:     Head: Normocephalic and atraumatic.     Right Ear: Tympanic membrane, ear canal and external ear normal.     Left Ear: Tympanic membrane, ear canal and external ear normal.     Nose:     Comments: Nose and mouth deferred due to covid masking; does have crepitus and malalignment of jaw Eyes:     Extraocular Movements: Extraocular movements intact.     Pupils: Pupils are equal, round, and reactive to light.  Neck:     Musculoskeletal: Normal range of motion. Muscular tenderness present.  Cardiovascular:     Rate and Rhythm: Normal rate and regular rhythm.     Pulses: Normal pulses.     Heart sounds: Normal heart sounds.  Pulmonary:     Effort: Pulmonary effort is normal.     Breath sounds: Normal breath sounds.  Abdominal:     General: Bowel sounds are normal. There is no distension.     Palpations: Abdomen is soft. There is no  mass.     Tenderness: There is no abdominal tenderness. There is no guarding or rebound.  Musculoskeletal: Normal range of motion.  Lymphadenopathy:     Cervical: No cervical adenopathy.  Skin:    General: Skin is warm and dry.     Capillary Refill: Capillary refill takes less than 2 seconds.     Coloration: Skin is pale.  Neurological:     General: No focal deficit present.     Mental Status: She is alert and oriented to person, place, and time. Mental status is at baseline.     Cranial Nerves: No cranial nerve deficit.     Sensory: No sensory deficit.     Motor: No weakness.     Coordination: Coordination normal.     Gait: Gait normal.     Deep Tendon Reflexes: Reflexes normal.  Psychiatric:        Mood and Affect: Mood normal.        Behavior: Behavior normal.        Thought Content: Thought content normal.        Judgment: Judgment normal.     Labs reviewed: Basic Metabolic Panel: Recent Labs    09/11/18 0837  NA 137  K 5.0  CL 103  CO2 26  GLUCOSE 107*  BUN 15  CREATININE 1.06*  CALCIUM 10.3   Liver Function Tests: Recent Labs    09/11/18 0837  AST 25  ALT 23  BILITOT 0.6  PROT 7.0   No results for input(s): LIPASE, AMYLASE in the last 8760 hours. No results for input(s): AMMONIA in the last 8760 hours. CBC: Recent Labs    09/11/18 0837  WBC 6.5  NEUTROABS 3,575  HGB 14.9  HCT 45.2*  MCV 90.8  PLT 304   Cardiac Enzymes: No results for input(s): CKTOTAL, CKMB, CKMBINDEX, TROPONINI in the last 8760 hours. BNP: Invalid input(s): POCBNP Lab Results  Component Value Date   HGBA1C 6.3 (H) 09/11/2018   No results found for: TSH No results found for: VITAMINB12 No results found for: FOLATE No results found for: IRON, TIBC, FERRITIN  Imaging and Procedures Recently: EKG  NSR at 89bpm.  No acute ischemia or infarct on personal review today.  Assessment/Plan 1. Annual physical exam -performed today  2. Mixed hyperlipidemia - lipids elevated  but not new; does not tolerate statins, continue to work on diet as not high enough for repatha and did not qualify when tried before to get her on it - EKG 25-JDYN - Basic metabolic panel; Future - Lipid panel; Future  3. Hyperglycemia - work on diet and continue exercise - Hemoglobin A1c; Future  4. Benign essential hypertension -bp is well controlled, cont same regimen and avoid high sodium foods - Basic metabolic panel; Future  5. TMJ (temporomandibular joint disorder) -printed out information about TMJ specialist locally and she was going to reach out to them  6. Sjogren's syndrome with keratoconjunctivitis sicca (HCC) -ongoing, cont biotene and other measures, has followed with rheum  7. Generalized osteoarthritis of multiple sites -ongoing, cont regular mobility, prn tylenol--does not take anything consistently for her pain  Labs/tests ordered:   Lab Orders     Basic metabolic panel     Lipid panel     Hemoglobin A1c EKG  F/u 6 mos med mgt, labs before  Francesca Strome L. Jalexa Pifer, D.O. [facility] [address], [place] [address] Cell Phone (Mon-Fri 8am-5pm):  [phone] On Call:  [phone] & follow prompts after 5pm & weekends Office Phone:  [phone] Office Fax:  [phone]

## 2018-09-26 DIAGNOSIS — M9903 Segmental and somatic dysfunction of lumbar region: Secondary | ICD-10-CM | POA: Diagnosis not present

## 2018-09-26 DIAGNOSIS — M5134 Other intervertebral disc degeneration, thoracic region: Secondary | ICD-10-CM | POA: Diagnosis not present

## 2018-09-26 DIAGNOSIS — M5136 Other intervertebral disc degeneration, lumbar region: Secondary | ICD-10-CM | POA: Diagnosis not present

## 2018-09-26 DIAGNOSIS — M9902 Segmental and somatic dysfunction of thoracic region: Secondary | ICD-10-CM | POA: Diagnosis not present

## 2018-10-02 ENCOUNTER — Other Ambulatory Visit: Payer: Self-pay | Admitting: Internal Medicine

## 2018-10-09 DIAGNOSIS — M9902 Segmental and somatic dysfunction of thoracic region: Secondary | ICD-10-CM | POA: Diagnosis not present

## 2018-10-09 DIAGNOSIS — M9903 Segmental and somatic dysfunction of lumbar region: Secondary | ICD-10-CM | POA: Diagnosis not present

## 2018-10-09 DIAGNOSIS — M5134 Other intervertebral disc degeneration, thoracic region: Secondary | ICD-10-CM | POA: Diagnosis not present

## 2018-10-09 DIAGNOSIS — M5136 Other intervertebral disc degeneration, lumbar region: Secondary | ICD-10-CM | POA: Diagnosis not present

## 2018-10-23 DIAGNOSIS — M5134 Other intervertebral disc degeneration, thoracic region: Secondary | ICD-10-CM | POA: Diagnosis not present

## 2018-10-23 DIAGNOSIS — M5136 Other intervertebral disc degeneration, lumbar region: Secondary | ICD-10-CM | POA: Diagnosis not present

## 2018-10-23 DIAGNOSIS — M9903 Segmental and somatic dysfunction of lumbar region: Secondary | ICD-10-CM | POA: Diagnosis not present

## 2018-10-23 DIAGNOSIS — M9902 Segmental and somatic dysfunction of thoracic region: Secondary | ICD-10-CM | POA: Diagnosis not present

## 2018-11-02 DIAGNOSIS — M5136 Other intervertebral disc degeneration, lumbar region: Secondary | ICD-10-CM | POA: Diagnosis not present

## 2018-11-02 DIAGNOSIS — M9902 Segmental and somatic dysfunction of thoracic region: Secondary | ICD-10-CM | POA: Diagnosis not present

## 2018-11-02 DIAGNOSIS — M5134 Other intervertebral disc degeneration, thoracic region: Secondary | ICD-10-CM | POA: Diagnosis not present

## 2018-11-02 DIAGNOSIS — M9903 Segmental and somatic dysfunction of lumbar region: Secondary | ICD-10-CM | POA: Diagnosis not present

## 2018-11-15 ENCOUNTER — Other Ambulatory Visit: Payer: Self-pay | Admitting: Internal Medicine

## 2018-11-15 DIAGNOSIS — B079 Viral wart, unspecified: Secondary | ICD-10-CM | POA: Diagnosis not present

## 2018-11-15 DIAGNOSIS — L821 Other seborrheic keratosis: Secondary | ICD-10-CM | POA: Diagnosis not present

## 2018-11-15 DIAGNOSIS — L720 Epidermal cyst: Secondary | ICD-10-CM | POA: Diagnosis not present

## 2018-11-15 DIAGNOSIS — B078 Other viral warts: Secondary | ICD-10-CM | POA: Diagnosis not present

## 2018-11-16 DIAGNOSIS — M5136 Other intervertebral disc degeneration, lumbar region: Secondary | ICD-10-CM | POA: Diagnosis not present

## 2018-11-16 DIAGNOSIS — M5134 Other intervertebral disc degeneration, thoracic region: Secondary | ICD-10-CM | POA: Diagnosis not present

## 2018-11-16 DIAGNOSIS — M9903 Segmental and somatic dysfunction of lumbar region: Secondary | ICD-10-CM | POA: Diagnosis not present

## 2018-11-16 DIAGNOSIS — M9902 Segmental and somatic dysfunction of thoracic region: Secondary | ICD-10-CM | POA: Diagnosis not present

## 2018-11-23 DIAGNOSIS — M5136 Other intervertebral disc degeneration, lumbar region: Secondary | ICD-10-CM | POA: Diagnosis not present

## 2018-11-23 DIAGNOSIS — M5134 Other intervertebral disc degeneration, thoracic region: Secondary | ICD-10-CM | POA: Diagnosis not present

## 2018-11-23 DIAGNOSIS — M9903 Segmental and somatic dysfunction of lumbar region: Secondary | ICD-10-CM | POA: Diagnosis not present

## 2018-11-23 DIAGNOSIS — M9902 Segmental and somatic dysfunction of thoracic region: Secondary | ICD-10-CM | POA: Diagnosis not present

## 2018-11-30 DIAGNOSIS — M5136 Other intervertebral disc degeneration, lumbar region: Secondary | ICD-10-CM | POA: Diagnosis not present

## 2018-11-30 DIAGNOSIS — M9903 Segmental and somatic dysfunction of lumbar region: Secondary | ICD-10-CM | POA: Diagnosis not present

## 2018-11-30 DIAGNOSIS — M9902 Segmental and somatic dysfunction of thoracic region: Secondary | ICD-10-CM | POA: Diagnosis not present

## 2018-11-30 DIAGNOSIS — M5134 Other intervertebral disc degeneration, thoracic region: Secondary | ICD-10-CM | POA: Diagnosis not present

## 2018-12-06 DIAGNOSIS — M5134 Other intervertebral disc degeneration, thoracic region: Secondary | ICD-10-CM | POA: Diagnosis not present

## 2018-12-06 DIAGNOSIS — M9902 Segmental and somatic dysfunction of thoracic region: Secondary | ICD-10-CM | POA: Diagnosis not present

## 2018-12-06 DIAGNOSIS — M5136 Other intervertebral disc degeneration, lumbar region: Secondary | ICD-10-CM | POA: Diagnosis not present

## 2018-12-06 DIAGNOSIS — M9903 Segmental and somatic dysfunction of lumbar region: Secondary | ICD-10-CM | POA: Diagnosis not present

## 2018-12-12 DIAGNOSIS — M9902 Segmental and somatic dysfunction of thoracic region: Secondary | ICD-10-CM | POA: Diagnosis not present

## 2018-12-12 DIAGNOSIS — M9903 Segmental and somatic dysfunction of lumbar region: Secondary | ICD-10-CM | POA: Diagnosis not present

## 2018-12-12 DIAGNOSIS — M5136 Other intervertebral disc degeneration, lumbar region: Secondary | ICD-10-CM | POA: Diagnosis not present

## 2018-12-12 DIAGNOSIS — M5134 Other intervertebral disc degeneration, thoracic region: Secondary | ICD-10-CM | POA: Diagnosis not present

## 2018-12-21 DIAGNOSIS — M9903 Segmental and somatic dysfunction of lumbar region: Secondary | ICD-10-CM | POA: Diagnosis not present

## 2018-12-21 DIAGNOSIS — M5134 Other intervertebral disc degeneration, thoracic region: Secondary | ICD-10-CM | POA: Diagnosis not present

## 2018-12-21 DIAGNOSIS — M5136 Other intervertebral disc degeneration, lumbar region: Secondary | ICD-10-CM | POA: Diagnosis not present

## 2018-12-21 DIAGNOSIS — M9902 Segmental and somatic dysfunction of thoracic region: Secondary | ICD-10-CM | POA: Diagnosis not present

## 2019-01-01 DIAGNOSIS — M9902 Segmental and somatic dysfunction of thoracic region: Secondary | ICD-10-CM | POA: Diagnosis not present

## 2019-01-01 DIAGNOSIS — M5134 Other intervertebral disc degeneration, thoracic region: Secondary | ICD-10-CM | POA: Diagnosis not present

## 2019-01-01 DIAGNOSIS — M9903 Segmental and somatic dysfunction of lumbar region: Secondary | ICD-10-CM | POA: Diagnosis not present

## 2019-01-01 DIAGNOSIS — M5136 Other intervertebral disc degeneration, lumbar region: Secondary | ICD-10-CM | POA: Diagnosis not present

## 2019-01-15 DIAGNOSIS — M5134 Other intervertebral disc degeneration, thoracic region: Secondary | ICD-10-CM | POA: Diagnosis not present

## 2019-01-15 DIAGNOSIS — M9902 Segmental and somatic dysfunction of thoracic region: Secondary | ICD-10-CM | POA: Diagnosis not present

## 2019-01-15 DIAGNOSIS — M9903 Segmental and somatic dysfunction of lumbar region: Secondary | ICD-10-CM | POA: Diagnosis not present

## 2019-01-15 DIAGNOSIS — M5136 Other intervertebral disc degeneration, lumbar region: Secondary | ICD-10-CM | POA: Diagnosis not present

## 2019-01-29 DIAGNOSIS — M9902 Segmental and somatic dysfunction of thoracic region: Secondary | ICD-10-CM | POA: Diagnosis not present

## 2019-01-29 DIAGNOSIS — M9903 Segmental and somatic dysfunction of lumbar region: Secondary | ICD-10-CM | POA: Diagnosis not present

## 2019-01-29 DIAGNOSIS — M5134 Other intervertebral disc degeneration, thoracic region: Secondary | ICD-10-CM | POA: Diagnosis not present

## 2019-01-29 DIAGNOSIS — M5136 Other intervertebral disc degeneration, lumbar region: Secondary | ICD-10-CM | POA: Diagnosis not present

## 2019-02-12 DIAGNOSIS — M5136 Other intervertebral disc degeneration, lumbar region: Secondary | ICD-10-CM | POA: Diagnosis not present

## 2019-02-12 DIAGNOSIS — M5134 Other intervertebral disc degeneration, thoracic region: Secondary | ICD-10-CM | POA: Diagnosis not present

## 2019-02-12 DIAGNOSIS — M9902 Segmental and somatic dysfunction of thoracic region: Secondary | ICD-10-CM | POA: Diagnosis not present

## 2019-02-12 DIAGNOSIS — M9903 Segmental and somatic dysfunction of lumbar region: Secondary | ICD-10-CM | POA: Diagnosis not present

## 2019-02-26 DIAGNOSIS — M9903 Segmental and somatic dysfunction of lumbar region: Secondary | ICD-10-CM | POA: Diagnosis not present

## 2019-02-26 DIAGNOSIS — M9904 Segmental and somatic dysfunction of sacral region: Secondary | ICD-10-CM | POA: Diagnosis not present

## 2019-02-26 DIAGNOSIS — M5136 Other intervertebral disc degeneration, lumbar region: Secondary | ICD-10-CM | POA: Diagnosis not present

## 2019-02-26 DIAGNOSIS — M9905 Segmental and somatic dysfunction of pelvic region: Secondary | ICD-10-CM | POA: Diagnosis not present

## 2019-03-13 DIAGNOSIS — M9903 Segmental and somatic dysfunction of lumbar region: Secondary | ICD-10-CM | POA: Diagnosis not present

## 2019-03-13 DIAGNOSIS — M9905 Segmental and somatic dysfunction of pelvic region: Secondary | ICD-10-CM | POA: Diagnosis not present

## 2019-03-13 DIAGNOSIS — M5136 Other intervertebral disc degeneration, lumbar region: Secondary | ICD-10-CM | POA: Diagnosis not present

## 2019-03-13 DIAGNOSIS — M9904 Segmental and somatic dysfunction of sacral region: Secondary | ICD-10-CM | POA: Diagnosis not present

## 2019-03-15 DIAGNOSIS — H401132 Primary open-angle glaucoma, bilateral, moderate stage: Secondary | ICD-10-CM | POA: Diagnosis not present

## 2019-03-15 DIAGNOSIS — H26492 Other secondary cataract, left eye: Secondary | ICD-10-CM | POA: Diagnosis not present

## 2019-03-15 DIAGNOSIS — Z961 Presence of intraocular lens: Secondary | ICD-10-CM | POA: Diagnosis not present

## 2019-03-20 ENCOUNTER — Other Ambulatory Visit: Payer: Self-pay

## 2019-03-20 ENCOUNTER — Other Ambulatory Visit: Payer: Medicare Other

## 2019-03-20 DIAGNOSIS — R739 Hyperglycemia, unspecified: Secondary | ICD-10-CM

## 2019-03-20 DIAGNOSIS — I10 Essential (primary) hypertension: Secondary | ICD-10-CM | POA: Diagnosis not present

## 2019-03-20 DIAGNOSIS — E782 Mixed hyperlipidemia: Secondary | ICD-10-CM

## 2019-03-21 LAB — HEMOGLOBIN A1C
Hgb A1c MFr Bld: 6.4 % of total Hgb — ABNORMAL HIGH (ref <5.7)
Mean Plasma Glucose: 137 (calc)
eAG (mmol/L): 7.6 (calc)

## 2019-03-21 LAB — BASIC METABOLIC PANEL
BUN/Creatinine Ratio: 15 (calc) (ref 6–22)
BUN: 15 mg/dL (ref 7–25)
CO2: 26 mmol/L (ref 20–32)
Calcium: 10.1 mg/dL (ref 8.6–10.4)
Chloride: 103 mmol/L (ref 98–110)
Creat: 0.99 mg/dL — ABNORMAL HIGH (ref 0.60–0.93)
Glucose, Bld: 124 mg/dL — ABNORMAL HIGH (ref 65–99)
Potassium: 4 mmol/L (ref 3.5–5.3)
Sodium: 138 mmol/L (ref 135–146)

## 2019-03-21 LAB — LIPID PANEL
Cholesterol: 168 mg/dL (ref <200)
HDL: 37 mg/dL — ABNORMAL LOW (ref >=50)
LDL Cholesterol (Calc): 106 mg/dL (calc) — ABNORMAL HIGH
Non-HDL Cholesterol (Calc): 131 mg/dL (calc) — ABNORMAL HIGH (ref <130)
Total CHOL/HDL Ratio: 4.5 (calc) (ref <5.0)
Triglycerides: 142 mg/dL (ref <150)

## 2019-03-22 ENCOUNTER — Ambulatory Visit: Payer: Medicare Other | Admitting: Internal Medicine

## 2019-03-27 DIAGNOSIS — M9905 Segmental and somatic dysfunction of pelvic region: Secondary | ICD-10-CM | POA: Diagnosis not present

## 2019-03-27 DIAGNOSIS — M9903 Segmental and somatic dysfunction of lumbar region: Secondary | ICD-10-CM | POA: Diagnosis not present

## 2019-03-27 DIAGNOSIS — M5136 Other intervertebral disc degeneration, lumbar region: Secondary | ICD-10-CM | POA: Diagnosis not present

## 2019-03-27 DIAGNOSIS — M9904 Segmental and somatic dysfunction of sacral region: Secondary | ICD-10-CM | POA: Diagnosis not present

## 2019-03-30 ENCOUNTER — Encounter: Payer: Self-pay | Admitting: Internal Medicine

## 2019-03-30 ENCOUNTER — Ambulatory Visit (INDEPENDENT_AMBULATORY_CARE_PROVIDER_SITE_OTHER): Payer: Medicare Other | Admitting: Internal Medicine

## 2019-03-30 ENCOUNTER — Other Ambulatory Visit: Payer: Self-pay

## 2019-03-30 VITALS — BP 120/70 | HR 90 | Temp 97.5°F | Ht 67.0 in | Wt 168.0 lb

## 2019-03-30 DIAGNOSIS — M25551 Pain in right hip: Secondary | ICD-10-CM | POA: Diagnosis not present

## 2019-03-30 DIAGNOSIS — M5441 Lumbago with sciatica, right side: Secondary | ICD-10-CM | POA: Diagnosis not present

## 2019-03-30 DIAGNOSIS — M26609 Unspecified temporomandibular joint disorder, unspecified side: Secondary | ICD-10-CM

## 2019-03-30 DIAGNOSIS — E782 Mixed hyperlipidemia: Secondary | ICD-10-CM | POA: Diagnosis not present

## 2019-03-30 DIAGNOSIS — M159 Polyosteoarthritis, unspecified: Secondary | ICD-10-CM | POA: Diagnosis not present

## 2019-03-30 DIAGNOSIS — I10 Essential (primary) hypertension: Secondary | ICD-10-CM

## 2019-03-30 DIAGNOSIS — R739 Hyperglycemia, unspecified: Secondary | ICD-10-CM | POA: Diagnosis not present

## 2019-03-30 DIAGNOSIS — M25552 Pain in left hip: Secondary | ICD-10-CM

## 2019-03-30 DIAGNOSIS — G8929 Other chronic pain: Secondary | ICD-10-CM

## 2019-03-30 NOTE — Progress Notes (Signed)
Location:  [facility] clinic Provider:  Aran Menning L. Mariea Clonts, D.O., C.M.D.  Goals of Care:  Advanced Directives 09/20/2018  Does Patient Have a Medical Advance Directive? Yes  Type of Paramedic of [place];Living will  Does patient want to make changes to medical advance directive? No - Patient declined  Copy of [place] in Chart? -  Would patient like information on creating a medical advance directive? -     Chief Complaint  Patient presents with  . Medical Management of Chronic Issues    46mth follow-up    HPI: Patient is a 73 y.o. female seen today for medical management of chronic diseases.    Her 25 yo cat is still alive.  Pt was w/o hot water for weeks!  Now has new 2020 water heater.  Shoulders, arms, hands and feet all getting more painful lately.  Has maintained her exercise bike routine amid the pandemic. She is most bothered by her hips--they limit her walking now.  Sometimes she has pain sitting.  Bothers her in her buttocks over her sacrum and also goes down the back of her thighs.  Also has pain in the hip joints and groin.  That keeps her from walking the most.  Her prior back surgery helped her ability to bend over and move more, but did not improve her walking ability.  She'd love to be able to get out and walk, but it hurts too much.  She's not even had xrays of her hips.    She saw a dentist about her TMJ stuff.  She has on order a custom made bite guard.  It will be in finally 1/6.    Saw Dr. Prudencio Burly about her Sjogren's and for her routine exam.  Things were all stable.  Spirits are better this week vs. Last week with the water heater.  She's not so bothered by social isolation b/c she stays in anyway.  Sugar average trended up.  She is trying to cut back on her snacks.  She loves starchy snacks.    Past Medical History:  Diagnosis Date  . Acute upper respiratory infections of unspecified site   . Allergic rhinitis due to  pollen   . Benign essential hypertension   . Cervical spondylosis without myelopathy   . Cervicalgia   . Diaphragmatic hernia without mention of obstruction or gangrene   . Disorder of bone and cartilage, unspecified   . Diverticulosis of colon (without mention of hemorrhage)   . Encounter for long-term (current) use of other medications   . GERD (gastroesophageal reflux disease)   . History of hiatal hernia   . Hyperlipidemia LDL goal < 100   . 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 Other and unspecified hyperlipidemia   . Pain in joint, site unspecified   . Pre-diabetes   . Routine general medical examination at a health care facility   . Sjogren's syndrome ([place]) 10/27/2012  . Tear film insufficiency, unspecified   . Unspecified disorder of kidney and ureter   . Unspecified glaucoma(365.9)     Past Surgical History:  Procedure Laterality Date  . BELPHAROPTOSIS REPAIR     brow lift   bil  . CATARACT EXTRACTION W/ INTRAOCULAR LENS  IMPLANT, BILATERAL Bilateral 12/2014   Dr. Prudencio Burly  . [place]   . DILATION AND CURETTAGE OF UTERUS    . LUMBAR LAMINECTOMY/DECOMPRESSION MICRODISCECTOMY N/A 04/21/2017   Procedure: BILATERAL LUMBAR FOUR- LUMBAR  FIVE AND LEFT LUMBAR FIVE- SACRAL ONE LAMINOTOMY, MEDIAL FACETECTOMY AND FORAMINOTOMIES;  Surgeon: Jovita Gamma, MD;  Location: [place];  Service: Neurosurgery;  Laterality: N/A;  . NASAL SEPTUM SURGERY      Allergies  Allergen Reactions  . Statins Other (See Comments)    Muscle problems  . Allopurinol Rash  . Darvocet [Propoxyphene N-Acetaminophen] Rash    Outpatient Encounter Medications as of 03/30/2019  Medication Sig  . brinzolamide (AZOPT) 1 % ophthalmic suspension Place 1 drop into both eyes 2 (two) times daily.  . calcium citrate-vitamin D (CITRACAL+D) 315-200 MG-UNIT per tablet Take 1 tablet by mouth 4 (four) times daily.  . cholecalciferol (VITAMIN D) 1000 UNITS tablet Take 2,000  Units by mouth daily.   . fenofibrate (TRICOR) 145 MG tablet TAKE 1 TABLET BY MOUTH EVERY DAY  . levocetirizine (XYZAL ALLERGY 24HR) 5 MG tablet Take 5 mg by mouth every evening.  Marland Kitchen losartan (COZAAR) 50 MG tablet TAKE 1 TABLET BY MOUTH ONCE DAILY FOR BLOOD PRESSURE  . omeprazole (PRILOSEC) 20 MG capsule Take 20 mg by mouth daily.   . Plant Sterols and Stanols (CHOLEST OFF PO) Take 1 tablet by mouth 3 (three) times daily.    No facility-administered encounter medications on file as of 03/30/2019.    Review of Systems:  Review of Systems  Constitutional: Negative for chills and fever.  HENT: Negative for congestion and sore throat.        Dry mouth; tmj  Eyes: Negative for blurred vision.       Dry eyes, glaucoma  Respiratory: Negative for cough and shortness of breath.   Cardiovascular: Negative for chest pain, palpitations and leg swelling.  Gastrointestinal: Positive for constipation. Negative for abdominal pain, blood in stool, diarrhea and melena.  Genitourinary: Negative for dysuria.  Musculoskeletal: Positive for back pain and joint pain. Negative for falls.  Skin: Negative for itching and rash.  Neurological: Negative for dizziness and loss of consciousness.  Endo/Heme/Allergies: Does not bruise/bleed easily.  Psychiatric/Behavioral: Negative for depression and memory loss. The patient is not nervous/anxious and does not have insomnia.     Health Maintenance  Topic Date Due  . TETANUS/TDAP  09/13/2019 (Originally 04/13/2015)  . Fecal DNA (Cologuard)  02/29/2020  . MAMMOGRAM  06/11/2020  . INFLUENZA VACCINE  Completed  . DEXA SCAN  Completed  . Hepatitis C Screening  Completed  . PNA vac Low Risk Adult  Completed    Physical Exam: Vitals:   03/30/19 0931  BP: 120/70  Pulse: 90  Temp: (!) 97.5 F (36.4 C)  TempSrc: Oral  SpO2: 99%  Weight: 168 lb (76.2 kg)  Height: 5\' 7"  (1.702 m)   Body mass index is 26.31 kg/m. Physical Exam Vitals reviewed.  Constitutional:       Appearance: Normal appearance.  HENT:     Head: Normocephalic and atraumatic.  Cardiovascular:     Rate and Rhythm: Normal rate and regular rhythm.     Pulses: Normal pulses.     Heart sounds: Normal heart sounds.  Pulmonary:     Effort: Pulmonary effort is normal.     Breath sounds: Normal breath sounds. No wheezing, rhonchi or rales.  Abdominal:     General: Bowel sounds are normal.  Musculoskeletal:        General: Tenderness present. Normal range of motion.     Comments: Tender over right hip joint, sacrum   Skin:    General: Skin is warm and dry.  Coloration: Skin is pale.  Neurological:     General: No focal deficit present.     Mental Status: She is alert and oriented to person, place, and time.  Psychiatric:        Mood and Affect: Mood normal.        Behavior: Behavior normal.     Labs reviewed: Basic Metabolic Panel: Recent Labs    09/11/18 0837 03/20/19 0815  NA 137 138  K 5.0 4.0  CL 103 103  CO2 26 26  GLUCOSE 107* 124*  BUN 15 15  CREATININE 1.06* 0.99*  CALCIUM 10.3 10.1   Liver Function Tests: Recent Labs    09/11/18 0837  AST 25  ALT 23  BILITOT 0.6  PROT 7.0   No results for input(s): LIPASE, AMYLASE in the last 8760 hours. No results for input(s): AMMONIA in the last 8760 hours. CBC: Recent Labs    09/11/18 0837  WBC 6.5  NEUTROABS 3,575  HGB 14.9  HCT 45.2*  MCV 90.8  PLT 304   Lipid Panel: Recent Labs    09/11/18 0837 03/20/19 0815  CHOL 170 168  HDL 43* 37*  LDLCALC 106* 106*  TRIG 117 142  CHOLHDL 4.0 4.5   Lab Results  Component Value Date   HGBA1C 6.4 (H) 03/20/2019    Assessment/Plan 1. Chronic right-sided low back pain with right-sided sciatica - some of her discomfort may actually still be coming from her back, but she does also have localized hip tenderness and discomfort and sometimes groin pain so will image hips as she requests to determine degree of OA - DG HIPS BILAT WITH PELVIS 3-4 VIEWS;  Future  2. Generalized osteoarthritis of multiple sites - all getting a bit worse lately, but activity level unchanged - DG HIPS BILAT WITH PELVIS 3-4 VIEWS; Future  3. Bilateral hip pain -right greater than left; continue cycling, wants to be able to walk and hips are limiting factor -will get imaging to eval extent of OA - DG HIPS BILAT WITH PELVIS 3-4 VIEWS; Future  4. Mixed hyperlipidemia -stable, intolerant of statins, cont fenofibrate and cholestoff  5. Hyperglycemia -trending up very close to diabetic range now--counseled on decreasing crackers and increasing veggies and fruits for snacks, avoiding large portions of starchy carbs and desserts  6. TMJ (temporomandibular joint disorder) -awaits bite guard to come in to help with this discomfort  7. Benign essential hypertension -bp well controlled with losartan  Labs/tests ordered:   Orders Placed This Encounter  Procedures  . DG HIPS BILAT WITH PELVIS 3-4 VIEWS    Standing Status:   Future    Standing Expiration Date:   05/30/2020    Order Specific Question:   Reason for Exam (SYMPTOM  OR DIAGNOSIS REQUIRED)    Answer:   bilateral hip pain into groin at times; also buttock pain--? severity of hip OA    Order Specific Question:   Preferred imaging location?    Answer:   [facility]    Order Specific Question:   Release to patient    Answer:   Immediate    Order Specific Question:   Radiology Contrast Protocol - do NOT remove file path    Answer:   [email or web address]   Next appt:  09/25/2019 for CPE, fasting labs before  Stephnie Parlier L. Ahriana Gunkel, D.O. [facility] [address], [place] [address] Cell Phone (Mon-Fri 8am-5pm):  [phone] On Call:  [phone] & follow prompts after  5pm & weekends Office Phone:  [phone] Office Fax:  [phone]

## 2019-04-10 DIAGNOSIS — M5136 Other intervertebral disc degeneration, lumbar region: Secondary | ICD-10-CM | POA: Diagnosis not present

## 2019-04-10 DIAGNOSIS — M9903 Segmental and somatic dysfunction of lumbar region: Secondary | ICD-10-CM | POA: Diagnosis not present

## 2019-04-10 DIAGNOSIS — M9905 Segmental and somatic dysfunction of pelvic region: Secondary | ICD-10-CM | POA: Diagnosis not present

## 2019-04-10 DIAGNOSIS — M9904 Segmental and somatic dysfunction of sacral region: Secondary | ICD-10-CM | POA: Diagnosis not present

## 2019-04-17 ENCOUNTER — Other Ambulatory Visit: Payer: Self-pay | Admitting: Internal Medicine

## 2019-04-17 ENCOUNTER — Ambulatory Visit
Admission: RE | Admit: 2019-04-17 | Discharge: 2019-04-17 | Disposition: A | Discharge status: Home / Self Care | Payer: Medicare Other | Source: Ambulatory Visit | Attending: Internal Medicine | Admitting: Internal Medicine

## 2019-04-17 ENCOUNTER — Other Ambulatory Visit: Payer: Self-pay

## 2019-04-17 DIAGNOSIS — M159 Polyosteoarthritis, unspecified: Secondary | ICD-10-CM

## 2019-04-17 DIAGNOSIS — M25551 Pain in right hip: Secondary | ICD-10-CM

## 2019-04-17 DIAGNOSIS — G8929 Other chronic pain: Secondary | ICD-10-CM

## 2019-04-17 DIAGNOSIS — M25552 Pain in left hip: Secondary | ICD-10-CM

## 2019-04-17 NOTE — Progress Notes (Signed)
She has some extra bone build-up on one of the bones of her right buttock (iliac) and degeneration of her L4-5 disc which we knew of in her back.  No problems in the hips themselves.

## 2019-04-24 DIAGNOSIS — M9905 Segmental and somatic dysfunction of pelvic region: Secondary | ICD-10-CM | POA: Diagnosis not present

## 2019-04-24 DIAGNOSIS — M9904 Segmental and somatic dysfunction of sacral region: Secondary | ICD-10-CM | POA: Diagnosis not present

## 2019-04-24 DIAGNOSIS — M5136 Other intervertebral disc degeneration, lumbar region: Secondary | ICD-10-CM | POA: Diagnosis not present

## 2019-04-24 DIAGNOSIS — M9903 Segmental and somatic dysfunction of lumbar region: Secondary | ICD-10-CM | POA: Diagnosis not present

## 2019-05-07 ENCOUNTER — Other Ambulatory Visit: Payer: Self-pay

## 2019-05-07 ENCOUNTER — Encounter: Payer: Self-pay | Admitting: Orthopaedic Surgery

## 2019-05-07 ENCOUNTER — Ambulatory Visit: Payer: Medicare Other | Admitting: Orthopaedic Surgery

## 2019-05-07 DIAGNOSIS — M7061 Trochanteric bursitis, right hip: Secondary | ICD-10-CM

## 2019-05-07 DIAGNOSIS — M7062 Trochanteric bursitis, left hip: Secondary | ICD-10-CM

## 2019-05-07 NOTE — Progress Notes (Signed)
Office Visit Note   Patient: Felicia Hall           Date of Birth: Mar 03, 1946           MRN: [id number] Visit Date: 05/07/2019              Requested by: Gayland Curry, DO [place],  [place] [address] PCP: Gayland Curry, DO   Assessment & Plan: Visit Diagnoses:  1. Trochanteric bursitis, left hip   2. Trochanteric bursitis, right hip     Plan: I gave the patient reassurance that this is not a hip issue in terms of needing hip replacements.  I do not see the bone overgrowth that the radiologist alludes to and I gave her reassurance that that is not the fact.  I have recommended outpatient physical therapy on both trochanteric areas and IT band as well as her lumbar spine.  Hopefully stretching and dry needling and other modalities such as ultrasound and iontophoresis can help.  I did show her stretching exercises to try as well.  Of also recommended the topical Voltaren gel for her hips and her low back.  I gave her information about this.  All question concerns were answered addressed.  We can see her back in about 4 weeks to see how she is doing overall.  We will work on setting her up for outpatient physical therapy at [facility].  Follow-Up Instructions: Return in about 4 weeks (around 06/04/2019).   Orders:  No orders of the defined types were placed in this encounter.  No orders of the defined types were placed in this encounter.     Procedures: No procedures performed   Clinical Data: No additional findings.   Subjective: Chief Complaint  Patient presents with  . Right Hip - Pain  . Left Hip - Pain  The patient is a very pleasant 74 year old female who comes in with a history of bilateral hip pain with the right worse than left.  She points to the trochanteric area of the source of her pain on both hips.  She also has low back pain and she points to the posterior pelvis some more of the facet joints of the lumbar spine.  She does have a  history 2 years ago of lumbar surgery by Dr. Sherwood Gambler here in town.  It looks like it involved both L4 and L5 as well as the sacrum.  She denies any groin pain.  It hurts to lay on her side at night and she cannot walk for long period of time.  She states the x-rays quoted there was a "bony overgrowth on the right hip".  This is concerning for her.  She walks without an assistive device.  She is not a diabetic but has had glaucoma and retinopathy and says she is wary of steroids or steroid injections.  HPI  Review of Systems She currently denies any headache, chest pain, shortness of breath, fever, chills, nausea, vomiting  Objective: Vital Signs: There were no vitals taken for this visit.  Physical Exam She is alert and orient x3 and in no acute distress Ortho Exam Examination of both hips shows the move smoothly and fluidly with no pain in the groin at all.  Her pain is only to palpation of the trochanteric area and the IT band on both sides.  She does have low back pain to palpation of the lumbar spine.  This seems to be more facet joint  related. Specialty Comments:  No specialty comments available.  Imaging: No results found. I did independently reviewed x-rays of both hips and showed them to her.  I do not see any bony overgrowth.  The hip joints are nice and concentric.  There is excellent joint space in the femoral heads are nice and round.  There is no significant arthritic changes at all.  PMFS History: Patient Active Problem List   Diagnosis Date Noted  . 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  . Benign essential tremor 01/03/2017  . Irritable bowel syndrome with constipation 08/09/2016  . Neck pain, chronic 08/09/2016  . Spinal stenosis in cervical region 08/09/2016  . Generalized constriction of visual field 06/22/2016  . 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  Perennial allergic rhinitis 10/17/2015  . Chronic pain syndrome 04/30/2013  . Hyperglycemia 04/30/2013  . Low back pain with right-sided sciatica 04/30/2013  . 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 Family history of colonic polyps 04/30/2013  . Sjogren's syndrome ([place]) 10/27/2012  . 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 Encounter for long-term (current) use of other medications   . Disorder of bone and cartilage, unspecified   . Allergic rhinitis due to pollen   . Cervical spondylosis without myelopathy   . 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 Benign essential hypertension   . Mixed hyperlipidemia    Past Medical History:  Diagnosis Date  . Acute upper respiratory infections of unspecified site   . Allergic rhinitis due to pollen   . Benign essential hypertension   . Cervical spondylosis without myelopathy   . Cervicalgia   . Diaphragmatic hernia without mention of obstruction or gangrene   . Disorder of bone and cartilage, unspecified   . Diverticulosis of colon (without mention of hemorrhage)   . Encounter for long-term (current) use of other medications   . GERD (gastroesophageal reflux disease)   . History of hiatal hernia   . Hyperlipidemia LDL goal < 100   . 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 Other and unspecified hyperlipidemia   . Pain in joint, site unspecified   . Pre-diabetes   . Routine general medical examination at a health care facility   . Sjogren's syndrome ([place]) 10/27/2012  . Tear film insufficiency, unspecified   . Unspecified disorder of kidney and ureter   . Unspecified glaucoma(365.9)     Family History  Problem Relation Age of Onset  . Heart attack Mother   . Breast cancer Mother   . Pancreatitis Father   . Heart disease Brother        By-pass surgery  . Lupus Cousin     Past Surgical History:  Procedure Laterality Date  . BELPHAROPTOSIS REPAIR     brow lift   bil  . CATARACT EXTRACTION W/ INTRAOCULAR LENS   IMPLANT, BILATERAL Bilateral 12/2014   Dr. Prudencio Burly  . [place]   . DILATION AND CURETTAGE OF UTERUS    . LUMBAR LAMINECTOMY/DECOMPRESSION MICRODISCECTOMY N/A 04/21/2017   Procedure: BILATERAL LUMBAR FOUR- LUMBAR FIVE AND LEFT LUMBAR FIVE- SACRAL ONE LAMINOTOMY, MEDIAL FACETECTOMY AND FORAMINOTOMIES;  Surgeon: Jovita Gamma, MD;  Location: [place];  Service: Neurosurgery;  Laterality: N/A;  . NASAL SEPTUM SURGERY     Social History   Occupational History  . Occupation: Herbalist  Tobacco Use  . Smoking status: Never Smoker  . Smokeless tobacco: Never Used  Substance and Sexual Activity  . Alcohol use: Yes    Alcohol/week: 0.0 standard drinks    Comment: once a year  . Drug use: No  . Sexual activity: Not on file

## 2019-05-08 DIAGNOSIS — M5134 Other intervertebral disc degeneration, thoracic region: Secondary | ICD-10-CM | POA: Diagnosis not present

## 2019-05-08 DIAGNOSIS — M9902 Segmental and somatic dysfunction of thoracic region: Secondary | ICD-10-CM | POA: Diagnosis not present

## 2019-05-08 DIAGNOSIS — M9903 Segmental and somatic dysfunction of lumbar region: Secondary | ICD-10-CM | POA: Diagnosis not present

## 2019-05-08 DIAGNOSIS — M5136 Other intervertebral disc degeneration, lumbar region: Secondary | ICD-10-CM | POA: Diagnosis not present

## 2019-05-10 ENCOUNTER — Ambulatory Visit: Payer: Medicare Other | Attending: Orthopaedic Surgery | Admitting: Physical Therapy

## 2019-05-10 ENCOUNTER — Other Ambulatory Visit: Payer: Self-pay

## 2019-05-10 ENCOUNTER — Encounter: Payer: Self-pay | Admitting: Physical Therapy

## 2019-05-10 DIAGNOSIS — R262 Difficulty in walking, not elsewhere classified: Secondary | ICD-10-CM | POA: Insufficient documentation

## 2019-05-10 DIAGNOSIS — M25551 Pain in right hip: Secondary | ICD-10-CM | POA: Insufficient documentation

## 2019-05-10 DIAGNOSIS — M6281 Muscle weakness (generalized): Secondary | ICD-10-CM | POA: Diagnosis not present

## 2019-05-10 DIAGNOSIS — M25552 Pain in left hip: Secondary | ICD-10-CM

## 2019-05-10 NOTE — Therapy (Signed)
[facility] [place] [address]  [place], [place], [address] Phone: [phone]   Fax:  [phone]  Physical Therapy Evaluation  Patient Details  Name: Felicia Hall MRN: [id number] Date of Birth: 12/18/1945 Referring Provider (PT): Mcarthur Rossetti, MD   Encounter Date: 05/10/2019  PT End of Session - 05/10/19 1020    Visit Number  1    Number of Visits  12    Date for PT Re-Evaluation  06/21/19    Authorization Type  [employer] Medicare    PT Start Time  1020    PT Stop Time  1100    PT Time Calculation (min)  40 min    Activity Tolerance  Patient tolerated treatment well    Behavior During Therapy  [facility] for tasks assessed/performed       Past Medical History:  Diagnosis Date  . Acute upper respiratory infections of unspecified site   . Allergic rhinitis due to pollen   . Benign essential hypertension   . Cervical spondylosis without myelopathy   . Cervicalgia   . Diaphragmatic hernia without mention of obstruction or gangrene   . Disorder of bone and cartilage, unspecified   . Diverticulosis of colon (without mention of hemorrhage)   . Encounter for long-term (current) use of other medications   . GERD (gastroesophageal reflux disease)   . History of hiatal hernia   . Hyperlipidemia LDL goal < 100   . 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 Other and unspecified hyperlipidemia   . Pain in joint, site unspecified   . Pre-diabetes   . Routine general medical examination at a health care facility   . Sjogren's syndrome ([place]) 10/27/2012  . Tear film insufficiency, unspecified   . Unspecified disorder of kidney and ureter   . Unspecified glaucoma(365.9)     Past Surgical History:  Procedure Laterality Date  . BELPHAROPTOSIS REPAIR     brow lift   bil  . CATARACT EXTRACTION W/ INTRAOCULAR LENS  IMPLANT, BILATERAL Bilateral 12/2014   Dr. Prudencio Burly  . [place]   .  DILATION AND CURETTAGE OF UTERUS    . LUMBAR LAMINECTOMY/DECOMPRESSION MICRODISCECTOMY N/A 04/21/2017   Procedure: BILATERAL LUMBAR FOUR- LUMBAR FIVE AND LEFT LUMBAR FIVE- SACRAL ONE LAMINOTOMY, MEDIAL FACETECTOMY AND FORAMINOTOMIES;  Surgeon: Jovita Gamma, MD;  Location: [place];  Service: Neurosurgery;  Laterality: N/A;  . NASAL SEPTUM SURGERY      There were no vitals filed for this visit.   Subjective Assessment - 05/10/19 1023    Subjective  Pt reporting she has been having B hip pain for years (dating back to 1990s) which has been getting worse over time. Diagnosed as bursitis. Pain limits sitting and walking tolerance as well as laying on either sides.    Limitations  Sitting;Walking    How long can you sit comfortably?  10-15 minutes on firmer chair, longer in soft recliner    How long can you walk comfortably?  15-20 minutes    Diagnostic tests  04/17/19 - B hip x-ray negative: There is no evidence of hip fracture or dislocation. There is no evidence of arthropathy or other focal bone abnormality.    Patient Stated Goals  "to get rid of the pain so I can walk more"    Currently in Pain?  No/denies    Pain Score  0-No pain   5/10 on average   Pain Location  Hip  Pain Orientation  Right;Left   R slightly worse than L   Pain Descriptors / Indicators  Aching;Tender    Pain Type  Chronic pain    Pain Radiating Towards  intermittently down lateral thighs when laying on her side    Pain Onset  Other (comment)   originating back in 1990s   Pain Frequency  Intermittent    Aggravating Factors   walking long distances, laying on her sides, sometimes with sitting (esp with legs crossed)    Pain Relieving Factors  massage, Aleve    Effect of Pain on Daily Activities  limits walking tolerance (unable to shop at more than 1 store in a day)         [facility] PT Assessment - 05/10/19 1020      Assessment   Medical Diagnosis  B hip trochanteric bursitis    Referring Provider (PT)   Mcarthur Rossetti, MD    Onset Date/Surgical Date  --   chronic - 1990s   Next MD Visit  06/07/19    Prior Therapy  none for current problem; remote h/o PT for back pain      Precautions   Precautions  None      Balance Screen   Has the patient fallen in the past 6 months  No    Has the patient had a decrease in activity level because of a fear of falling?   No    Is the patient reluctant to leave their home because of a fear of falling?   No      Home Environment   Living Environment  Private residence    Living Arrangements  Alone    Type of [place] Access  Level entry    Home Layout  One level      Prior Function   Level of [place]  Retired    Leisure  stationary bike 30 min/day, light UE/LE weights; 10 yrs ago - walking 30 min/day most days dependent on the weather (wants to be able to do this again)      Cognition   Overall Cognitive Status  Within Functional Limits for tasks assessed      Observation/Other Assessments   Focus on Therapeutic Outcomes (FOTO)   Hip - 52% (48% limitation); Predicted 62% (38% limitation)      ROM / Strength   AROM / PROM / Strength  AROM;Strength      AROM   Overall AROM   Within functional limits for tasks performed    AROM Assessment Site  Hip      Strength   Overall Strength Comments  tested in sittting    Strength Assessment Site  Hip;Knee;Ankle    Right/Left Hip  Right;Left    Right Hip Flexion  3+/5    Right Hip Extension  3+/5    Right Hip External Rotation   3+/5    Right Hip Internal Rotation  3+/5   pain at hip   Right Hip ABduction  4-/5    Right Hip ADduction  4-/5    Left Hip Flexion  3+/5    Left Hip Extension  3+/5    Left Hip External Rotation  3+/5    Left Hip Internal Rotation  3+/5    Left Hip ABduction  4-/5    Left Hip ADduction  4-/5    Right/Left Knee  Right;Left    Right Knee Flexion  3+/5  Right Knee Extension  3+/5    Left Knee Flexion  3+/5    Left  Knee Extension  3+/5    Right/Left Ankle  Right;Left    Right Ankle Dorsiflexion  4/5    Right Ankle Plantar Flexion  3/5    Left Ankle Dorsiflexion  4/5    Left Ankle Plantar Flexion  3/5      Flexibility   Soft Tissue Assessment /Muscle Length  yes    Hamstrings  very mild tightness B but increased muscle tension and ttp    Quadriceps  very mild tightness B but increased muscle tension and ttp    ITB  mod tight B with ttp     Piriformis  very mild tightness B but increased muscle tension and ttp      Palpation   Palpation comment  ttp over B greater trochanter, quads, HS, ITB & glutes                Objective measurements completed on examination: See above findings.              PT Education - 05/10/19 1100    Education Details  PT eval findings and anticipated POC    Person(s) Educated  Patient    Methods  Explanation    Comprehension  Verbalized understanding       PT Short Term Goals - 05/10/19 1100      PT SHORT TERM GOAL #1   Title  Patient will be independent with initial HEP    Status  New    Target Date  05/31/19        PT Long Term Goals - 05/10/19 1100      PT LONG TERM GOAL #1   Title  Patient will be independent with ongoing/advanced HEP    Status  New    Target Date  06/21/19      PT LONG TERM GOAL #2   Title  Patient to report B hip pain reduction in frequency and intensity by >/= 50%    Status  New    Target Date  06/21/19      PT LONG TERM GOAL #3   Title  Patient will demonstrate improved B LE strength to >/= 4/5 to 4+/5 for improved stability and activity tolerance    Status  New    Target Date  06/21/19      PT LONG TERM GOAL #4   Title  Patient will report ability to initiate walking program for exercise at least 3x/week    Status  New    Target Date  06/21/19             Plan - 05/10/19 1100    Clinical Impression Statement  Nasha is a 74 y/o female who presents to OP PT for 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 Patient reports she thought the pain was due to OA however recent hip x-rays negative and MD told her that her pain was due to bursitis. Pain limits her in sitting as well as walking tolerance, preventing her from shopping at more than one store per trip or walking for exercise as she would like to. Pain also prevents her from laying on either side in bed. B hip ROM WNL but mild to moderate decreased proximal LE flexibility present, most notably in B ITBs, with increased muscle tension and ttp present throughout proximal LE muscles. Significant weakness noted in  B LEs which likely contributes to decreased activity tolerance.  Ikia will benefit from skilled PT to address above deficits including stretching for improved flexibility, strengthening for improved stability and increased ease of mobility, and balance training to reduce her risk for falls.    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, Sjogren's syndrome, HTN, glaucoma    Examination-Activity Limitations  Bed Mobility;Sleep;Sit;Stand;Locomotion Level    Examination-Participation Restrictions  Community Activity;Shop;Cleaning;Meal Prep    Stability/Clinical Decision Making  Evolving/Moderate complexity    Clinical Decision Making  Moderate    Rehab Potential  Good    PT Frequency  2x / week    PT Duration  6 weeks    PT Treatment/Interventions  ADLs/Self Care Home Management;Cryotherapy;Electrical Stimulation;Iontophoresis 4mg /ml Dexamethasone;Moist Heat;Ultrasound;Gait training;Functional mobility training;Therapeutic activities;Therapeutic exercise;Neuromuscular re-education;Patient/family education;Manual techniques;Passive range of  motion;Dry needling;Joint Manipulations    PT Next Visit Plan  ITB stretching & LE strengthening - create initial HEP as tolerated; manual therapy for increased muscle tension; modalities including possible trial of ionto patch(es) as indicated    Consulted and Agree with Plan of Care  Patient       Patient will benefit from skilled therapeutic intervention in order to improve the following deficits and impairments:  Abnormal gait, Decreased activity tolerance, Decreased balance, Decreased endurance, Decreased mobility, Decreased safety awareness, Decreased strength, Difficulty walking, Increased fascial restricitons, Increased muscle spasms, Impaired perceived functional ability, Impaired flexibility, Pain  Visit Diagnosis: Pain in right hip  Pain in left hip  Muscle weakness (generalized)  Difficulty in walking, not elsewhere classified     Problem List Patient Active Problem List   Diagnosis Date Noted  . 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  . Benign essential tremor 01/03/2017  . Irritable bowel syndrome with constipation 08/09/2016  . Neck pain, chronic 08/09/2016  . Spinal stenosis in cervical region 08/09/2016  . Generalized constriction of visual field 06/22/2016  . 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 Perennial allergic rhinitis 10/17/2015  . Chronic pain syndrome 04/30/2013  . Hyperglycemia 04/30/2013  . Low back pain with right-sided sciatica 04/30/2013  . 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 Family history of colonic polyps 04/30/2013  . Sjogren's syndrome ([place]) 10/27/2012  . 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 Encounter for long-term (current) use of other medications   . Disorder of bone and cartilage, unspecified   . Allergic rhinitis due to pollen   . Cervical spondylosis without myelopathy   . Unspecified glaucoma(365.9)    . Benign essential hypertension   . Mixed hyperlipidemia     Percival Spanish, PT, MPT 05/10/2019, 1:55 PM  [facility] [address]  [place], [place], [address] Phone: [phone]   Fax:  [phone]  Name: EBANY RAVINDRAN MRN: [id number] Date of Birth: [birth year]

## 2019-05-14 ENCOUNTER — Encounter: Payer: Self-pay | Admitting: Physical Therapy

## 2019-05-14 ENCOUNTER — Other Ambulatory Visit: Payer: Self-pay

## 2019-05-14 ENCOUNTER — Ambulatory Visit: Payer: Medicare Other | Attending: Orthopaedic Surgery | Admitting: Physical Therapy

## 2019-05-14 ENCOUNTER — Other Ambulatory Visit: Payer: Self-pay | Admitting: Internal Medicine

## 2019-05-14 DIAGNOSIS — R262 Difficulty in walking, not elsewhere classified: Secondary | ICD-10-CM | POA: Diagnosis not present

## 2019-05-14 DIAGNOSIS — M25551 Pain in right hip: Secondary | ICD-10-CM | POA: Insufficient documentation

## 2019-05-14 DIAGNOSIS — M6281 Muscle weakness (generalized): Secondary | ICD-10-CM | POA: Insufficient documentation

## 2019-05-14 DIAGNOSIS — M25552 Pain in left hip: Secondary | ICD-10-CM | POA: Diagnosis not present

## 2019-05-14 NOTE — Patient Instructions (Signed)
    Home exercise program created by Helia Haese, PT.  For questions, please contact Fordyce Lepak via phone at [phone] or email at [email or web address]  [facility] [place] [address]  [address] [place], [place], [address] Phone: [phone]   Fax:  [phone]    

## 2019-05-14 NOTE — Therapy (Signed)
[facility] [address]  [place], [place], [address] Phone: [phone]   Fax:  [phone]  Physical Therapy Treatment  Patient Details  Name: Felicia Hall MRN: [id number] Date of Birth: 1945/06/19 Referring Provider (PT): Mcarthur Rossetti, MD   Encounter Date: 05/14/2019  PT End of Session - 05/14/19 1015    Visit Number  2    Number of Visits  12    Date for PT Re-Evaluation  06/21/19    Authorization Type  UHC Medicare    PT Start Time  1015    PT Stop Time  1057    PT Time Calculation (min)  42 min    Activity Tolerance  Patient tolerated treatment well    Behavior During Therapy  [facility] for tasks assessed/performed       Past Medical History:  Diagnosis Date  . Acute upper respiratory infections of unspecified site   . Allergic rhinitis due to pollen   . Benign essential hypertension   . Cervical spondylosis without myelopathy   . Cervicalgia   . Diaphragmatic hernia without mention of obstruction or gangrene   . Disorder of bone and cartilage, unspecified   . Diverticulosis of colon (without mention of hemorrhage)   . Encounter for long-term (current) use of other medications   . GERD (gastroesophageal reflux disease)   . History of hiatal hernia   . Hyperlipidemia LDL goal < 100   . 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 Other and unspecified hyperlipidemia   . Pain in joint, site unspecified   . Pre-diabetes   . Routine general medical examination at a health care facility   . Sjogren's syndrome ([place]) 10/27/2012  . Tear film insufficiency, unspecified   . Unspecified disorder of kidney and ureter   . Unspecified glaucoma(365.9)     Past Surgical History:  Procedure Laterality Date  . BELPHAROPTOSIS REPAIR     brow lift   bil  . CATARACT EXTRACTION W/ INTRAOCULAR LENS  IMPLANT, BILATERAL Bilateral 12/2014   Dr. Prudencio Burly  . [place]   .  DILATION AND CURETTAGE OF UTERUS    . LUMBAR LAMINECTOMY/DECOMPRESSION MICRODISCECTOMY N/A 04/21/2017   Procedure: BILATERAL LUMBAR FOUR- LUMBAR FIVE AND LEFT LUMBAR FIVE- SACRAL ONE LAMINOTOMY, MEDIAL FACETECTOMY AND FORAMINOTOMIES;  Surgeon: Jovita Gamma, MD;  Location: [facility];  Service: Neurosurgery;  Laterality: N/A;  . NASAL SEPTUM SURGERY      There were no vitals filed for this visit.  Subjective Assessment - 05/14/19 1018    Diagnostic tests  04/17/19 - B hip x-ray negative: There is no evidence of hip fracture or dislocation. There is no evidence of arthropathy or other focal bone abnormality.    Patient Stated Goals  "to get rid of the pain so I can walk more"    Currently in Pain?  No/denies    Pain Score  0-No pain                       [facility] Adult PT Treatment/Exercise - 05/14/19 1015      Exercises   Exercises  Knee/Hip      Knee/Hip Exercises: Stretches   ITB Stretch Limitations  attempted supine with strap, standing lateral lean over counter, hip sag to wall and lateral flexion with hip away from wall - poor tolerance for all positions    Piriformis Stretch  Right;Left;30 seconds;2 reps  Piriformis Stretch Limitations  KTOS      Knee/Hip Exercises: Aerobic   Nustep  L2 x 6 min (UE/LE)   mild increased pain initially but subsided     Knee/Hip Exercises: Standing   Heel Raises  Both;10 reps;3 seconds    Hip Flexion  Right;Left;10 reps;Knee straight;AROM;Stengthening    Hip Abduction  Right;Left;10 reps;Knee straight;AROM;Stengthening    Hip Extension  Right;Left;10 reps;Knee straight;AROM;Stengthening    Functional Squat  10 reps;3 seconds    Functional Squat Limitations  counter minisquat - limited depth due to knee pain      Knee/Hip Exercises: Supine   Bridges  Both;10 reps;Strengthening    Bridges Limitations  + hip ABD isometric with yellow TB   R HS cramp on last rep   Other Supine Knee/Hip Exercises  Alt hip ABD/ER with yellow TB 10 x 3"       Manual Therapy   Manual Therapy  Other (comment)    Other Manual Therapy  Instructed patient in use of rolling pin for self STM to ITB, quads and HS             PT Education - 05/14/19 1057    Education Details  Initial HEP - KTOS glute/piriformis stretch, hooklying clam with yellow TB, 3-way standing SLR, heel raises, rolling pin self-STM    Person(s) Educated  Patient    Methods  Explanation;Demonstration;Verbal cues;Handout    Comprehension  Verbalized understanding;Returned demonstration;Verbal cues required;Need further instruction       PT Short Term Goals - 05/14/19 1019      PT SHORT TERM GOAL #1   Title  Patient will be independent with initial HEP    Status  On-going    Target Date  05/31/19        PT Long Term Goals - 05/14/19 1019      PT LONG TERM GOAL #1   Title  Patient will be independent with ongoing/advanced HEP    Status  On-going    Target Date  06/21/19      PT LONG TERM GOAL #2   Title  Patient to report B hip pain reduction in frequency and intensity by >/= 50%    Status  On-going    Target Date  06/21/19      PT LONG TERM GOAL #3   Title  Patient will demonstrate improved B LE strength to >/= 4/5 to 4+/5 for improved stability and activity tolerance    Status  On-going    Target Date  06/21/19      PT LONG TERM GOAL #4   Title  Patient will report ability to initiate walking program for exercise at least 3x/week    Status  On-going    Target Date  06/21/19            Plan - 05/14/19 1020    Clinical Impression Statement  Introduced stretching and proximal LE strengthening exercises in effort to establish initial HEP. Vonciel unable to find a tolerable version of ITB stretch that did not increase pain in hip or low back, therefore instructed her in glute/piriformis stretch and gentle self-STM using rolling pin to ITB and quads/HS. Reminder of initial HEP focusing on gentle proximal LE strengthening incorporating exercises that  did not increase her pain, although some fatigue noted following exercises. Will review and adjust accordingly net visit. Discussed possibility of ionto patches for greater trochanteric bursitis pain but deferred at this time due to patient report of h/o kidney issues  secondary to anti-inflammatories.    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, Sjogren's syndrome, HTN, glaucoma    Examination-Activity Limitations  Bed Mobility;Sleep;Sit;Stand;Locomotion Level    Examination-Participation Restrictions  Community Activity;Shop;Cleaning;Meal Prep    Stability/Clinical Decision Making  Evolving/Moderate complexity    Rehab Potential  Good    PT Frequency  2x / week    PT Duration  6 weeks    PT Treatment/Interventions  ADLs/Self Care Home Management;Cryotherapy;Electrical Stimulation;Iontophoresis 4mg /ml Dexamethasone;Moist Heat;Ultrasound;Gait training;Functional mobility training;Therapeutic activities;Therapeutic exercise;Neuromuscular re-education;Patient/family education;Manual techniques;Passive range of motion;Dry needling;Joint Manipulations    PT Next Visit Plan  review initial HEP; ITB stretching & LE strengthening; manual therapy for increased muscle tension; modalities as indicated    PT Home Exercise Plan  05/14/19 - KTOS glute/piriformis stretch, hooklying clam with yellow TB, 3-way standing SLR, heel raises, rolling pin self-STM    Consulted and Agree with Plan of Care  Patient       Patient will benefit from skilled therapeutic intervention in order to improve the following deficits and impairments:  Abnormal gait, Decreased activity tolerance, Decreased balance, Decreased endurance, Decreased mobility, Decreased safety awareness, Decreased  strength, Difficulty walking, Increased fascial restricitons, Increased muscle spasms, Impaired perceived functional ability, Impaired flexibility, Pain  Visit Diagnosis: Pain in right hip  Pain in left hip  Muscle weakness (generalized)  Difficulty in walking, not elsewhere classified     Problem List Patient Active Problem List   Diagnosis Date Noted  . 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  . Benign essential tremor 01/03/2017  . Irritable bowel syndrome with constipation 08/09/2016  . Neck pain, chronic 08/09/2016  . Spinal stenosis in cervical region 08/09/2016  . Generalized constriction of visual field 06/22/2016  . 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 Perennial allergic rhinitis 10/17/2015  . Chronic pain syndrome 04/30/2013  . Hyperglycemia 04/30/2013  . Low back pain with right-sided sciatica 04/30/2013  . 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 Family history of colonic polyps 04/30/2013  . Sjogren's syndrome ([place]) 10/27/2012  . 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 Encounter for long-term (current) use of other medications   . Disorder of bone and cartilage, unspecified   . Allergic rhinitis due to pollen   . Cervical spondylosis without myelopathy   . 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 Benign essential hypertension   . Mixed hyperlipidemia     Percival Spanish, PT, MPT 05/14/2019, 12:39 PM  [facility] [address]  [place], [place], [address] Phone: [phone]   Fax:  [phone]  Name: DORCUS ALEJANDRE MRN: [id number] Date of Birth: 1945/08/12

## 2019-05-17 ENCOUNTER — Other Ambulatory Visit: Payer: Self-pay

## 2019-05-17 ENCOUNTER — Ambulatory Visit: Payer: Medicare Other

## 2019-05-17 DIAGNOSIS — R262 Difficulty in walking, not elsewhere classified: Secondary | ICD-10-CM

## 2019-05-17 DIAGNOSIS — M6281 Muscle weakness (generalized): Secondary | ICD-10-CM | POA: Diagnosis not present

## 2019-05-17 DIAGNOSIS — M25551 Pain in right hip: Secondary | ICD-10-CM

## 2019-05-17 DIAGNOSIS — M25552 Pain in left hip: Secondary | ICD-10-CM | POA: Diagnosis not present

## 2019-05-17 NOTE — Therapy (Signed)
[facility] [address]  [place], [place], [address] Phone: [phone]   Fax:  [phone]  Physical Therapy Treatment  Patient Details  Name: Felicia Hall MRN: [id number] Date of Birth: 1945/04/30 Referring Provider (PT): Mcarthur Rossetti, MD   Encounter Date: 05/17/2019  PT End of Session - 05/17/19 1022    Visit Number  3    Number of Visits  12    Date for PT Re-Evaluation  06/21/19    Authorization Type  [employer] Medicare    PT Start Time  1016    PT Stop Time  1110   Ended visit with 10 min moist heat   PT Time Calculation (min)  54 min    Activity Tolerance  Patient tolerated treatment well    Behavior During Therapy  [facility] for tasks assessed/performed       Past Medical History:  Diagnosis Date  . Acute upper respiratory infections of unspecified site   . Allergic rhinitis due to pollen   . Benign essential hypertension   . Cervical spondylosis without myelopathy   . Cervicalgia   . Diaphragmatic hernia without mention of obstruction or gangrene   . Disorder of bone and cartilage, unspecified   . Diverticulosis of colon (without mention of hemorrhage)   . Encounter for long-term (current) use of other medications   . GERD (gastroesophageal reflux disease)   . History of hiatal hernia   . Hyperlipidemia LDL goal < 100   . 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 Other and unspecified hyperlipidemia   . Pain in joint, site unspecified   . Pre-diabetes   . Routine general medical examination at a health care facility   . Sjogren's syndrome ([place]) 10/27/2012  . Tear film insufficiency, unspecified   . Unspecified disorder of kidney and ureter   . Unspecified glaucoma(365.9)     Past Surgical History:  Procedure Laterality Date  . BELPHAROPTOSIS REPAIR     brow lift   bil  . CATARACT EXTRACTION W/ INTRAOCULAR LENS  IMPLANT, BILATERAL Bilateral 12/2014   Dr. Prudencio Burly  .  [place]   . DILATION AND CURETTAGE OF UTERUS    . LUMBAR LAMINECTOMY/DECOMPRESSION MICRODISCECTOMY N/A 04/21/2017   Procedure: BILATERAL LUMBAR FOUR- LUMBAR FIVE AND LEFT LUMBAR FIVE- SACRAL ONE LAMINOTOMY, MEDIAL FACETECTOMY AND FORAMINOTOMIES;  Surgeon: Jovita Gamma, MD;  Location: [facility];  Service: Neurosurgery;  Laterality: N/A;  . NASAL SEPTUM SURGERY      There were no vitals filed for this visit.  Subjective Assessment - 05/17/19 1020    Subjective  Pt. noting outer hip pain starts after walking >30 min.    Diagnostic tests  04/17/19 - B hip x-ray negative: There is no evidence of hip fracture or dislocation. There is no evidence of arthropathy or other focal bone abnormality.    Patient Stated Goals  "to get rid of the pain so I can walk more"    Currently in Pain?  No/denies    Pain Score  0-No pain    Pain Location  Hip    Pain Orientation  Right;Left   R>L   Pain Descriptors / Indicators  Aching;Tender    Pain Type  Chronic pain    Pain Onset  More than a month ago    Pain Frequency  Intermittent    Aggravating Factors   after walking >30 min    Pain Relieving Factors  massage, Aleve    Multiple Pain Sites  No                       [facility] Adult PT Treatment/Exercise - 05/17/19 0001      Knee/Hip Exercises: Stretches   Piriformis Stretch  Right;Left;30 seconds;2 reps    Piriformis Stretch Limitations  KTOS - towel       Knee/Hip Exercises: Aerobic   Nustep  L2 x 6 min (UE/LE)      Knee/Hip Exercises: Standing   Heel Raises  Both;10 reps;3 seconds   cues to avoid prolonged hold at top of ROM due to discomfort   Heel Raises Limitations  at wall     Hip Abduction  Right;Left;10 reps;Knee straight;AROM;Stengthening    Abduction Limitations  cues for alternating performance    mild increase in tenderness in B buttocks      Modalities   Modalities  Moist Heat      Moist Heat Therapy   Number Minutes Moist Heat  10 Minutes     Moist Heat Location  Hip   B buttocks in hooklying with white bolster under knees      Manual Therapy   Manual Therapy  Soft tissue mobilization;Myofascial release;Passive ROM    Manual therapy comments  B sidelying with LE elevated on bolster     Soft tissue mobilization  STM/DTM to glute max, glute min, glute med, piriformis (R>L) - tender throughout    Myofascial Release  TPR to L glute medius, L glute max; TPR to R glute medius, R piriformis - twitch response elicited and reduction in tenderness reported     Passive ROM  Manual B ITB stretch supine                PT Short Term Goals - 05/14/19 1019      PT SHORT TERM GOAL #1   Title  Patient will be independent with initial HEP    Status  On-going    Target Date  05/31/19        PT Long Term Goals - 05/14/19 1019      PT LONG TERM GOAL #1   Title  Patient will be independent with ongoing/advanced HEP    Status  On-going    Target Date  06/21/19      PT LONG TERM GOAL #2   Title  Patient to report B hip pain reduction in frequency and intensity by >/= 50%    Status  On-going    Target Date  06/21/19      PT LONG TERM GOAL #3   Title  Patient will demonstrate improved B LE strength to >/= 4/5 to 4+/5 for improved stability and activity tolerance    Status  On-going    Target Date  06/21/19      PT LONG TERM GOAL #4   Title  Patient will report ability to initiate walking program for exercise at least 3x/week    Status  On-going    Target Date  06/21/19            Plan - 05/17/19 1025    Clinical Impression Statement  Pt. reporting she has not had difficulty performing HEP with exception of calf raise.  Review of calf raise revealing pt. holding at top of movement too long causing excessive calf fatigue.  Improved tolerance verbalized after instruction on proper hold time.  Session focused on MT addressing increased tone/tension in B buttocks musculature.  TPs noted throughout B glutes/piriformis  musculature with palpable twitch response elicited with TPR in multiple spots in B glutes.  Pt. noting reduction in glute tenderness following MT however will likely benefit from further skilled MT in glutes for improved activity tolerance.  Tolerated gentle manual ITB stretching with therapist today without issue however was tender over B GT.  Ended visit with trial of moist heat to B buttocks in hooklying for relaxation of tense musculature with relief reported following this.  Will consider further LE strengthening, stretching, MT, and modalities for improved tissue quality in coming sessions.    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, Sjogren's syndrome, HTN, glaucoma    Rehab Potential  Good    PT Treatment/Interventions  ADLs/Self Care Home Management;Cryotherapy;Electrical Stimulation;Iontophoresis 4mg /ml Dexamethasone;Moist Heat;Ultrasound;Gait training;Functional mobility training;Therapeutic activities;Therapeutic exercise;Neuromuscular re-education;Patient/family education;Manual techniques;Passive range of motion;Dry needling;Joint Manipulations    PT Next Visit Plan  ITB stretching & LE strengthening; manual therapy for increased muscle tension; modalities as indicated    PT Home Exercise Plan  05/14/19 - KTOS glute/piriformis stretch, hooklying clam with yellow TB, 3-way standing SLR, heel raises, rolling pin self-STM    Consulted and Agree with Plan of Care  Patient       Patient will benefit from skilled therapeutic intervention in order to improve the following deficits and impairments:  Abnormal gait, Decreased activity tolerance, Decreased balance, Decreased endurance, Decreased mobility, Decreased safety awareness, Decreased strength, Difficulty walking, Increased fascial restricitons, Increased muscle spasms, Impaired perceived functional ability,  Impaired flexibility, Pain  Visit Diagnosis: Pain in right hip  Pain in left hip  Muscle weakness (generalized)  Difficulty in walking, not elsewhere classified     Problem List Patient Active Problem List   Diagnosis Date Noted  . 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  . Benign essential tremor 01/03/2017  . Irritable bowel syndrome with constipation 08/09/2016  . Neck pain, chronic 08/09/2016  . Spinal stenosis in cervical region 08/09/2016  . Generalized constriction of visual field 06/22/2016  . 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 Perennial allergic rhinitis 10/17/2015  . Chronic pain syndrome 04/30/2013  . Hyperglycemia 04/30/2013  . Low back pain with right-sided sciatica 04/30/2013  . 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 Family history of colonic polyps 04/30/2013  . Sjogren's syndrome ([place]) 10/27/2012  . 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 Encounter for long-term (current) use of other medications   . Disorder of bone and cartilage, unspecified   . Allergic rhinitis due to pollen   . Cervical spondylosis without myelopathy   . 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 Benign essential hypertension   . Mixed hyperlipidemia     Bess Harvest, [place] 05/17/19 12:34 PM   [facility] [address]  [place], [place], [address] Phone: [phone]   Fax:  [phone]  Name: Felicia Hall MRN: [id number] Date of Birth: 05/08/1945

## 2019-05-21 ENCOUNTER — Other Ambulatory Visit: Payer: Self-pay

## 2019-05-21 ENCOUNTER — Encounter: Payer: Self-pay | Admitting: Physical Therapy

## 2019-05-21 ENCOUNTER — Ambulatory Visit: Payer: Medicare Other | Admitting: Physical Therapy

## 2019-05-21 DIAGNOSIS — M25551 Pain in right hip: Secondary | ICD-10-CM | POA: Diagnosis not present

## 2019-05-21 DIAGNOSIS — M25552 Pain in left hip: Secondary | ICD-10-CM

## 2019-05-21 DIAGNOSIS — M6281 Muscle weakness (generalized): Secondary | ICD-10-CM

## 2019-05-21 DIAGNOSIS — R262 Difficulty in walking, not elsewhere classified: Secondary | ICD-10-CM

## 2019-05-21 NOTE — Patient Instructions (Signed)
   Kinesiology tape  What is kinesiology tape?  There are many brands of kinesiology tape. KTape, Rock Tape, Body Sport, Dynamic tape, to name a few.  It is an elasticized tape designed to support the body's natural healing process. This tape provides stability and support to muscles and joints without restricting motion.  It can also help decrease swelling in the area of application.  How does it work?  The tape microscopically lifts and decompresses the skin to allow for drainage of lymph (swelling) to flow away from area, reducing inflammation. The tape has the ability to help re-educate the neuromuscular system by targeting specific receptors in the skin. The presence of the tape increases the body's awareness of posture and body mechanics.  Do not use with:  . Open wounds . Skin lesions . Adhesive allergies  In some rare cases, mild/moderate skin irritation can occur. This can include redness, itchiness, or hives. If this occurs, immediately remove tape and consult your primary care physician if symptoms are severe or do not resolve within 2 days.  Safe removal of the tape:  To remove tape safely, hold nearby skin with one hand and gentle roll tape down with other hand. You can apply oil or conditioner to tape while in shower prior to removal to loosen adhesive. DO NOT swiftly rip tape off like a band-aid, as this could cause skin tears and additional skin irritation.     For questions, please contact your therapist at:  [facility] [address]  [address] [place], [place], [address] Phone: [phone]   Fax:  [phone]     

## 2019-05-21 NOTE — Therapy (Signed)
[facility] [address]  [place], [place], [address] Phone: [phone]   Fax:  [phone]  Physical Therapy Treatment  Patient Details  Name: Felicia Hall MRN: [id number] Date of Birth: 11-18-1945 Referring Provider (PT): Mcarthur Rossetti, MD   Encounter Date: 05/21/2019  PT End of Session - 05/21/19 1315    Visit Number  4    Number of Visits  12    Date for PT Re-Evaluation  06/21/19    Authorization Type  [employer]HC Medicare    PT Start Time  1315    PT Stop Time  1413    PT Time Calculation (min)  58 min    Activity Tolerance  Patient tolerated treatment well    Behavior During Therapy  [facility] for tasks assessed/performed       Past Medical History:  Diagnosis Date  . Acute upper respiratory infections of unspecified site   . Allergic rhinitis due to pollen   . Benign essential hypertension   . Cervical spondylosis without myelopathy   . Cervicalgia   . Diaphragmatic hernia without mention of obstruction or gangrene   . Disorder of bone and cartilage, unspecified   . Diverticulosis of colon (without mention of hemorrhage)   . Encounter for long-term (current) use of other medications   . GERD (gastroesophageal reflux disease)   . History of hiatal hernia   . Hyperlipidemia LDL goal < 100   . 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 Other and unspecified hyperlipidemia   . Pain in joint, site unspecified   . Pre-diabetes   . Routine general medical examination at a health care facility   . Sjogren's syndrome ([place]) 10/27/2012  . Tear film insufficiency, unspecified   . Unspecified disorder of kidney and ureter   . Unspecified glaucoma(365.9)     Past Surgical History:  Procedure Laterality Date  . BELPHAROPTOSIS REPAIR     brow lift   bil  . CATARACT EXTRACTION W/ INTRAOCULAR LENS  IMPLANT, BILATERAL Bilateral 12/2014   Dr. Prudencio Burly  . [place]   .  DILATION AND CURETTAGE OF UTERUS    . LUMBAR LAMINECTOMY/DECOMPRESSION MICRODISCECTOMY N/A 04/21/2017   Procedure: BILATERAL LUMBAR FOUR- LUMBAR FIVE AND LEFT LUMBAR FIVE- SACRAL ONE LAMINOTOMY, MEDIAL FACETECTOMY AND FORAMINOTOMIES;  Surgeon: Jovita Gamma, MD;  Location: [facility];  Service: Neurosurgery;  Laterality: N/A;  . NASAL SEPTUM SURGERY      There were no vitals filed for this visit.  Subjective Assessment - 05/21/19 1322    Subjective  Pt reporting moderate to severe R knee pain today which she thinks may have stemmed from SLS on R during standing exercises as part of last therapy session.    Diagnostic tests  04/17/19 - B hip x-ray negative: There is no evidence of hip fracture or dislocation. There is no evidence of arthropathy or other focal bone abnormality.    Patient Stated Goals  "to get rid of the pain so I can walk more"    Currently in Pain?  Yes    Pain Score  3     Pain Location  Hip    Pain Orientation  Right;Left    Pain Descriptors / Indicators  Aching;Tender    Pain Type  Chronic pain    Pain Frequency  Intermittent    Multiple Pain Sites  Yes    Pain Score  6    Pain Location  Knee    Pain Orientation  Right    Pain Descriptors / Indicators  Sharp;Cramping    Pain Type  Acute pain;Chronic pain    Pain Radiating Towards  into R calf    Pain Onset  In the past 7 days    Pain Frequency  Intermittent    Aggravating Factors   walking, sit <> stand transitions    Pain Relieving Factors  Tylenol, ice, knee brace    Effect of Pain on Daily Activities  limited standing and walking tolerance, knee feels like it may buckle                       [facility] Adult PT Treatment/Exercise - 05/21/19 1315      Exercises   Exercises  Knee/Hip      Knee/Hip Exercises: Stretches   Piriformis Stretch  Right;Left;30 seconds;2 reps    Piriformis Stretch Limitations  KTOS      Knee/Hip Exercises: Aerobic   Nustep  L2 x 6 min (UE/LE)      Knee/Hip Exercises:  Supine   Other Supine Knee/Hip Exercises  Alt hip ABD/ER with yellow TB 10 x 3"      Knee/Hip Exercises: Sidelying   Clams  R clam with yellow TB 10 x 2-3"      Modalities   Modalities  Moist Heat;Cryotherapy      Moist Heat Therapy   Number Minutes Moist Heat  10 Minutes    Moist Heat Location  Hip   B buttocks in hooklying with white bolster under knees      Cryotherapy   Number Minutes Cryotherapy  10 Minutes    Cryotherapy Location  Knee   R   Type of Cryotherapy  Ice pack      Manual Therapy   Manual Therapy  Soft tissue mobilization;Myofascial release;Passive ROM;Taping    Manual therapy comments  supine    Soft tissue mobilization  STM/DTM to R distal HS (medial > lateral), R ITB    Myofascial Release  gentle pin and stretch to R ITB    Passive ROM  manual R ITB stretch    Kinesiotex  Inhibit Muscle      Kinesiotix   Inhibit Muscle   R ITB 30 % stretch from insertion to upper lateral thigh    Light blue - KinsioTex Gold - Light Touch +            PT Education - 05/21/19 1400    Education Details  Kinesiotape wearing instructions    Person(s) Educated  Patient    Methods  Explanation;Demonstration;Handout    Comprehension  Verbalized understanding       PT Short Term Goals - 05/14/19 1019      PT SHORT TERM GOAL #1   Title  Patient will be independent with initial HEP    Status  On-going    Target Date  05/31/19        PT Long Term Goals - 05/14/19 1019      PT LONG TERM GOAL #1   Title  Patient will be independent with ongoing/advanced HEP    Status  On-going    Target Date  06/21/19      PT LONG TERM GOAL #2   Title  Patient to report B hip pain reduction in frequency and intensity by >/= 50%    Status  On-going    Target Date  06/21/19      PT LONG TERM  GOAL #3   Title  Patient will demonstrate improved B LE strength to >/= 4/5 to 4+/5 for improved stability and activity tolerance    Status  On-going    Target Date  06/21/19      PT  LONG TERM GOAL #4   Title  Patient will report ability to initiate walking program for exercise at least 3x/week    Status  On-going    Target Date  06/21/19            Plan - 05/21/19 1413    Clinical Impression Statement  Felicia Hall arriving to PT with c/o increased R knee pain which she attributes to SLS during standing exercises last visit. She is wearing a soft knee brace (donned upside down) for knee support and demonstrates increased antalgic gait on R. She localizes pain to distal lateral R knee over ITB insertion but is ttp t/o ITB distal quads (lateral > medial) and HS (medial > lateral) with multiple taut bands and TPs identified. These areas were addressed with gentle manual therapy including STM, TPR and gentle pin and stretch along with passive ITB stretching. Some increased pain reported during manual therapy with pressure adjusted accordingly and patient reporting decreased pain and improved exercise tolerance following manual therapy. Initiated trial of kinesiotaping for R ITB using sensitive skin KinesioTex and will assess response on next visit - if benefit noted and no skin irritation, may consider additional taping for knee pain or over greater trochanter. Treatment concluded with ice pack to R knee with heat to B posterior hips to promote further pain reduction.    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, Sjogren's syndrome, HTN, glaucoma    Examination-Activity Limitations  Bed Mobility;Sleep;Sit;Stand;Locomotion Level    Examination-Participation Restrictions  Community Activity;Shop;Cleaning;Meal Prep    Rehab Potential  Good    PT Frequency  2x / week    PT Duration  6 weeks    PT Treatment/Interventions  ADLs/Self Care Home  Management;Cryotherapy;Electrical Stimulation;Iontophoresis 4mg /ml Dexamethasone;Moist Heat;Ultrasound;Gait training;Functional mobility training;Therapeutic activities;Therapeutic exercise;Neuromuscular re-education;Patient/family education;Manual techniques;Passive range of motion;Dry needling;Joint Manipulations    PT Next Visit Plan  assess response to taping; ITB stretching & LE strengthening; manual therapy for increased muscle tension; modalities as indicated    PT Home Exercise Plan  05/14/19 - KTOS glute/piriformis stretch, hooklying clam with yellow TB, 3-way standing SLR, heel raises, rolling pin self-STM    Consulted and Agree with Plan of Care  Patient       Patient will benefit from skilled therapeutic intervention in order to improve the following deficits and impairments:  Abnormal gait, Decreased activity tolerance, Decreased balance, Decreased endurance, Decreased mobility, Decreased safety awareness, Decreased strength, Difficulty walking, Increased fascial restricitons, Increased muscle spasms, Impaired perceived functional ability, Impaired flexibility, Pain  Visit Diagnosis: Pain in right hip  Pain in left hip  Muscle weakness (generalized)  Difficulty in walking, not elsewhere classified     Problem List Patient Active Problem List   Diagnosis Date Noted  . 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  . Benign essential tremor 01/03/2017  . Irritable bowel syndrome with constipation 08/09/2016  . Neck pain, chronic 08/09/2016  . Spinal stenosis in cervical region 08/09/2016  . Generalized constriction of visual field 06/22/2016  . 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 Perennial allergic rhinitis 10/17/2015  . Chronic pain syndrome 04/30/2013  . Hyperglycemia 04/30/2013  . Low back pain with right-sided sciatica 04/30/2013  . Osteopenia, senile  04/30/2013  . Family history of colonic polyps 04/30/2013  . Sjogren's syndrome ([place]) 10/27/2012  . 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 Encounter for long-term (current) use of other medications   . Disorder of bone and cartilage, unspecified   . Allergic rhinitis due to pollen   . Cervical spondylosis without myelopathy   . 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 Benign essential hypertension   . Mixed hyperlipidemia     Percival Spanish, PT, MPT 05/21/2019, 4:15 PM  [facility] [address]  [place], [place], [address] Phone: [phone]   Fax:  [phone]  Name: Felicia Hall MRN: [id number] Date of Birth: [birth year]

## 2019-05-22 ENCOUNTER — Encounter: Payer: Self-pay | Admitting: Family

## 2019-05-22 ENCOUNTER — Telehealth (INDEPENDENT_AMBULATORY_CARE_PROVIDER_SITE_OTHER): Payer: Medicare Other | Admitting: Family

## 2019-05-22 DIAGNOSIS — S61210A Laceration without foreign body of right index finger without damage to nail, initial encounter: Secondary | ICD-10-CM

## 2019-05-22 DIAGNOSIS — Z23 Encounter for immunization: Secondary | ICD-10-CM | POA: Diagnosis not present

## 2019-05-22 DIAGNOSIS — M9903 Segmental and somatic dysfunction of lumbar region: Secondary | ICD-10-CM | POA: Diagnosis not present

## 2019-05-22 DIAGNOSIS — M5134 Other intervertebral disc degeneration, thoracic region: Secondary | ICD-10-CM | POA: Diagnosis not present

## 2019-05-22 DIAGNOSIS — M5136 Other intervertebral disc degeneration, lumbar region: Secondary | ICD-10-CM | POA: Diagnosis not present

## 2019-05-22 DIAGNOSIS — M9902 Segmental and somatic dysfunction of thoracic region: Secondary | ICD-10-CM | POA: Diagnosis not present

## 2019-05-22 MED ORDER — TETANUS-DIPHTH-ACELL PERTUSSIS 5-2.5-18.5 LF-MCG/0.5 IM SUSP: 0.5000 mL | Freq: Once | INTRAMUSCULAR | 0 refills | Status: AC

## 2019-05-22 NOTE — Progress Notes (Signed)
Patient ID: Felicia Hall, female   DOB: 12-19-1945, 74 y.o.   MRN: [id number] This service is provided via telemedicine  No vital signs collected/recorded due to the encounter was a telemedicine visit.   Location of patient (ex: home, work):  HOME  Patient consents to a telephone visit:  YES  Location of the provider (ex: office, home):  OFFICE  Name of any referring provider:  TIFFANY REED, DO  Names of all persons participating in the telemedicine service and their role in the encounter:  PATIENT, Edwin Dada, [place], [facility] , NP   Time spent on call:  4:00   Provider:   FNP-C  Gayland Curry, DO  Patient Care Team: Gayland Curry, DO as PCP - General (Geriatric Medicine)  Extended Emergency Contact Information Primary Emergency Contact: Ancheta,Thomas Address: [address], [place] Phone: [phone] Relation: Brother  Code Status:  DNR Goals of care: Advanced Directive information Advanced Directives 05/10/2019  Does Patient Have a Medical Advance Directive? Yes  Type of Paramedic of [place];Living will  Does patient want to make changes to medical advance directive? No - Patient declined  Copy of [place] in Chart? Yes - validated most recent copy scanned in chart (See row information)  Would patient like information on creating a medical advance directive? -     Chief Complaint  Patient presents with  . Acute Visit    cut finger X 1HOUR    HPI:  Pt is a 74 y.o. female seen today for an acute visit for evaluation of cut on right index finger one hour ago prior to visit.She was opening a cat food aluminium can got cut by the lid.she had some bleeding but now stopped.she describes cut as shallow.she has applied Neosporin topical antibiotics and a Band Aid.she would like to know if she needs a Tetanus shot.On chart review last Tdap vaccine was given  04/12/2005.   Past Medical History:  Diagnosis Date  . Acute upper respiratory infections of unspecified site   . Allergic rhinitis due to pollen   . Benign essential hypertension   . Cervical spondylosis without myelopathy   . Cervicalgia   . Diaphragmatic hernia without mention of obstruction or gangrene   . Disorder of bone and cartilage, unspecified   . Diverticulosis of colon (without mention of hemorrhage)   . Encounter for long-term (current) use of other medications   . GERD (gastroesophageal reflux disease)   . History of hiatal hernia   . Hyperlipidemia LDL goal < 100   . 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 Other and unspecified hyperlipidemia   . Pain in joint, site unspecified   . Pre-diabetes   . Routine general medical examination at a health care facility   . Sjogren's syndrome ([place]) 10/27/2012  . Tear film insufficiency, unspecified   . Unspecified disorder of kidney and ureter   . Unspecified glaucoma(365.9)    Past Surgical History:  Procedure Laterality Date  . BELPHAROPTOSIS REPAIR     brow lift   bil  . CATARACT EXTRACTION W/ INTRAOCULAR LENS  IMPLANT, BILATERAL Bilateral 12/2014   Dr. Prudencio Burly  . [place]   . DILATION AND CURETTAGE OF UTERUS    . LUMBAR LAMINECTOMY/DECOMPRESSION MICRODISCECTOMY N/A 04/21/2017   Procedure: BILATERAL LUMBAR FOUR- LUMBAR FIVE AND LEFT LUMBAR FIVE- SACRAL ONE LAMINOTOMY, MEDIAL FACETECTOMY  AND FORAMINOTOMIES;  Surgeon: Jovita Gamma, MD;  Location: [facility];  Service: Neurosurgery;  Laterality: N/A;  . NASAL SEPTUM SURGERY      Allergies  Allergen Reactions  . Statins Other (See Comments)    Muscle problems  . Allopurinol Rash  . Darvocet [Propoxyphene N-Acetaminophen] Rash    Outpatient Encounter Medications as of 05/22/2019  Medication Sig  . brinzolamide (AZOPT) 1 % ophthalmic suspension Place 1 drop into both eyes 2 (two) times daily.  . calcium citrate-vitamin D  (CITRACAL+D) 315-200 MG-UNIT per tablet Take 1 tablet by mouth 4 (four) times daily.  . cholecalciferol (VITAMIN D) 1000 UNITS tablet Take 2,000 Units by mouth daily.   . diclofenac Sodium (VOLTAREN) 1 % GEL Apply 2 g topically 4 (four) times daily.  . fenofibrate (TRICOR) 145 MG tablet TAKE 1 TABLET BY MOUTH EVERY DAY  . losartan (COZAAR) 50 MG tablet TAKE 1 TABLET BY MOUTH ONCE DAILY FOR BLOOD PRESSURE  . omeprazole (PRILOSEC) 20 MG capsule Take 20 mg by mouth daily.   . [DISCONTINUED] Plant Sterols and Stanols (CHOLEST OFF PO) Take 1 tablet by mouth 3 (three) times daily.   . [DISCONTINUED] levocetirizine (XYZAL ALLERGY 24HR) 5 MG tablet Take 5 mg by mouth every evening.   No facility-administered encounter medications on file as of 05/22/2019.    Review of Systems  Constitutional: Negative for appetite change, chills, fatigue and fever.  Respiratory: Negative for cough, chest tightness, shortness of breath and wheezing.   Cardiovascular: Negative for chest pain, palpitations and leg swelling.  Skin: Negative for color change, pallor and rash.       Index finger cut from opening a cat food can.  Neurological: Negative for dizziness, light-headedness, numbness and headaches.    Immunization History  Administered Date(s) Administered  . Influenza, High Dose Seasonal PF 01/03/2017, 03/16/2018, 02/01/2019  . Influenza,inj,Quad PF,6+ Mos 12/27/2014, 01/15/2016  . Influenza-Unspecified 01/10/2008, 02/15/2011, 01/20/2012  . Pneumococcal Conjugate-13 11/01/2013  . Pneumococcal Polysaccharide-23 09/14/2010, 09/12/2017  . Tdap 04/12/2005  . Zoster 04/12/2005  . Zoster Recombinat (Shingrix) 09/28/2018   Pertinent  Health Maintenance Due  Topic Date Due  . MAMMOGRAM  06/11/2020  . INFLUENZA VACCINE  Completed  . DEXA SCAN  Completed  . PNA vac Low Risk Adult  Completed   Fall Risk  05/22/2019 03/30/2019 09/21/2018 09/20/2018 06/09/2018  Falls in the past year? 0 0 0 0 0  Number falls in past  yr: 0 0 0 0 0  Injury with Fall? 0 0 0 0 0  Risk for fall due to : - - - - -  Follow up - - - - -   There were no vitals filed for this visit. There is no height or weight on file to calculate BMI. Physical Exam Unable to complete on telephone visit.   Labs reviewed: Recent Labs    09/11/18 0837 03/20/19 0815  NA 137 138  K 5.0 4.0  CL 103 103  CO2 26 26  GLUCOSE 107* 124*  BUN 15 15  CREATININE 1.06* 0.99*  CALCIUM 10.3 10.1   Recent Labs    09/11/18 0837  AST 25  ALT 23  BILITOT 0.6  PROT 7.0   Recent Labs    09/11/18 0837  WBC 6.5  NEUTROABS 3,575  HGB 14.9  HCT 45.2*  MCV 90.8  PLT 304   No results found for: TSH Lab Results  Component Value Date   HGBA1C 6.4 (H) 03/20/2019   Lab Results  Component Value Date   CHOL 168 03/20/2019   HDL 37 (L) 03/20/2019   LDLCALC 106 (H) 03/20/2019   TRIG 142 03/20/2019   CHOLHDL 4.5 03/20/2019    Significant Diagnostic Results in last 30 days:  No results found.  Assessment/Plan 1. Laceration of right index finger without foreign body without damage to nail, initial encounter Sustained one hour ago prior to visit while opening a cat food can.reports bleeding has stopped.shallow laceration. - Advised wash index finger cut with antibacterial soap and water,pat dry,apply antibiotic ointment and cover with Band Aid daily until laceration heals. - Notify provider's office for any fever > 100, swelling,redness or drainage from laceration site. - Please get Tdap vaccine administered at your pharmacy.Prescription send today.   - Tdap ([place]) 5-2.5-18.5 LF-MCG/0.5 injection; Inject 0.5 mLs into the muscle once for 1 dose.  Dispense: 0.5 mL; Refill: 0  2. Need for Tdap vaccination Chart review last T dap administered 1//04/2005 - Advised get Tdap vaccine administered at your pharmacy.Prescription send today.  - Tdap ([place]) 5-2.5-18.5 LF-MCG/0.5 injection; Inject 0.5 mLs into the muscle once for 1 dose.   Dispense: 0.5 mL; Refill: 0  Family/ staff Communication: Reviewed plan of care with patient  Labs/tests ordered: None   Next Appointment: as needed if symptoms worsen or fail to improve  Spent 11 minutes of non-face to face with patient    Sandrea Hughs, NP

## 2019-05-22 NOTE — Patient Instructions (Signed)
- please wash index finger cut with antibacterial soap and water,pat dry,apply antibiotic ointment and cover with Band Aid daily until laceration heals. - Notify provider's office for any fever > 100, swelling,redness or drainage from laceration site. - Please get Tdap vaccine administered at your pharmacy.Prescription send today.   Laceration Care, Adult A laceration is a cut that may go through all layers of the skin. The cut may also go into the tissue that is right under the skin. Some cuts heal on their own. Others need to be closed with stitches (sutures), staples, skin adhesive strips, or skin glue. Taking care of your injury lowers your risk of infection, helps your injury to heal better, and may prevent scarring. Supplies needed:  Soap.  Water.  Hand sanitizer.  Bandage (dressing).  Antibiotic ointment.  Clean towel. How to take care of your cut Wash your hands with soap and water before touching your wound or changing your bandage. If soap and water are not available, use hand sanitizer. If your doctor used stitches or staples:  Keep the wound clean and dry.  If you were given a bandage, change it at least once a day as told by your doctor. You should also change it if it gets wet or dirty.  Keep the wound completely dry for the first 24 hours, or as told by your doctor. After that, you may take a shower or a bath. Do not get the wound soaked in water until after the stitches or staples have been removed.  Clean the wound once a day, or as told by your doctor: ? Wash the wound with soap and water. ? Rinse the wound with water to remove all soap. ? Pat the wound dry with a clean towel. Do not rub the wound.  After you clean the wound, put a thin layer of antibiotic ointment on it as told by your doctor. This ointment: ? Helps to prevent infection. ? Keeps the bandage from sticking to the wound.  Have your stitches or staples removed as told by your doctor. If your  doctor used skin adhesive strips:  Keep the wound clean and dry.  If you were given a bandage, you should change it at least once a day as told by your doctor. You should also change it if it gets wet or dirty.  Do not get the skin adhesive strips wet. You can take a shower or a bath, but keep the wound dry.  If the wound gets wet, pat it dry with a clean towel. Do not rub the wound.  Skin adhesive strips fall off on their own. You can trim the strips as the wound heals. Do not remove any strips that are still stuck to the wound. They will fall off after a while. If your doctor used skin glue:  Try to keep your wound dry, but you may briefly wet it in the shower or bath. Do not soak the wound in water, such as by swimming.  After you take a shower or a bath, gently pat the wound dry with a clean towel. Do not rub the wound.  Do not do any activities that will make you really sweaty until the skin glue has fallen off on its own.  Do not apply liquid, cream, or ointment medicine to your wound while the skin glue is still on.  If you were given a bandage, you should change it at least once a day or as told by your doctor. You  should also change it if it gets dirty or wet.  If a bandage is placed over the wound, do not let the tape touch the skin glue.  Do not pick at the glue. The skin glue usually stays on for 5-10 days. Then, it falls off the skin. General instructions   Take over-the-counter and prescription medicines only as told by your doctor.  If you were given antibiotic medicine or ointment, take or apply it as told by your doctor. Do not stop using it even if your condition improves.  Do not scratch or pick at the wound.  Check your wound every day for signs of infection. Watch for: ? Redness, swelling, or pain. ? Fluid, blood, or pus.  Raise (elevate) the injured area above the level of your heart while you are sitting or lying down.  If directed, put ice on the  affected area: ? Put ice in a plastic bag. ? Place a towel between your skin and the bag. ? Leave the ice on for 20 minutes, 2-3 times a day.  Prevent scarring by covering your wound with sunscreen of at least 30 SPF whenever you are outside after your wound has healed.  Keep all follow-up visits as told by your doctor. This is important. Get help if:  You got a tetanus shot and you have any of these problems at the injection site: ? Swelling. ? Very bad pain. ? Redness. ? Bleeding.  You have a fever.  A wound that was closed breaks open.  You notice a bad smell coming from your wound or your bandage.  You notice something coming out of the wound, such as wood or glass.  Medicine does not relieve your pain.  You have more redness, swelling, or pain at the site of your wound.  You have fluid, blood, or pus coming from your wound.  You notice a change in the color of your skin near your wound.  You need to change the bandage often because fluid, blood, or pus is coming from the wound.  You start to have a new rash.  You start to have numbness around the wound. Get help right away if:  You have very bad swelling around the wound.  Your pain suddenly gets worse and is very bad.  You notice painful lumps near the wound or anywhere on your body.  You have a red streak going away from your wound.  The wound is on your hand or foot, and: ? You cannot move a finger or toe. ? Your fingers or toes look pale or bluish. Summary  A laceration is a cut that may go through all layers of the skin. The cut may also go into the tissue right under the skin.  Some cuts heal on their own. Others need to be closed with stitches, staples, skin adhesive strips, or skin glue.  Follow your doctor's instructions for caring for your cut. Proper care of a cut lowers the risk of infection, helps the cut heal better, and prevents scarring. This information is not intended to replace advice  given to you by your health care provider. Make sure you discuss any questions you have with your health care provider. Document Revised: 05/27/2017 Document Reviewed: 04/18/2017 Elsevier Patient Education  [place].

## 2019-05-24 ENCOUNTER — Other Ambulatory Visit: Payer: Self-pay

## 2019-05-24 ENCOUNTER — Ambulatory Visit: Payer: Medicare Other

## 2019-05-24 DIAGNOSIS — M25552 Pain in left hip: Secondary | ICD-10-CM

## 2019-05-24 DIAGNOSIS — M6281 Muscle weakness (generalized): Secondary | ICD-10-CM

## 2019-05-24 DIAGNOSIS — R262 Difficulty in walking, not elsewhere classified: Secondary | ICD-10-CM | POA: Diagnosis not present

## 2019-05-24 DIAGNOSIS — M25551 Pain in right hip: Secondary | ICD-10-CM

## 2019-05-24 NOTE — Therapy (Signed)
Felicia Hall [address]  [place], [place], [address] Phone: [phone]   Fax:  [phone]  Physical Therapy Treatment  Patient Details  Name: Felicia Hall MRN: [id number] Date of Birth: 21-Dec-1945 Referring Provider (PT): Mcarthur Rossetti, MD   Encounter Date: 05/24/2019  PT End of Session - 05/24/19 1036    Visit Number  5    Number of Visits  12    Date for PT Re-Evaluation  06/21/19    Authorization Type  [employer] Medicare    PT Start Time  1015    PT Stop Time  1102    PT Time Calculation (min)  47 min    Activity Tolerance  Patient tolerated treatment well    Behavior During Therapy  [facility] for tasks assessed/performed       Past Medical History:  Diagnosis Date  . Acute upper respiratory infections of unspecified site   . Allergic rhinitis due to pollen   . Benign essential hypertension   . Cervical spondylosis without myelopathy   . Cervicalgia   . Diaphragmatic hernia without mention of obstruction or gangrene   . Disorder of bone and cartilage, unspecified   . Diverticulosis of colon (without mention of hemorrhage)   . Encounter for long-term (current) use of other medications   . GERD (gastroesophageal reflux disease)   . History of hiatal hernia   . Hyperlipidemia LDL goal < 100   . 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 Other and unspecified hyperlipidemia   . Pain in joint, site unspecified   . Pre-diabetes   . Routine general medical examination at a health care facility   . Sjogren's syndrome ([place]) 10/27/2012  . Tear film insufficiency, unspecified   . Unspecified disorder of kidney and ureter   . Unspecified glaucoma(365.9)     Past Surgical History:  Procedure Laterality Date  . BELPHAROPTOSIS REPAIR     brow lift   bil  . CATARACT EXTRACTION W/ INTRAOCULAR LENS  IMPLANT, BILATERAL Bilateral 12/2014   Dr. Prudencio Burly  . [place]   .  DILATION AND CURETTAGE OF UTERUS    . LUMBAR LAMINECTOMY/DECOMPRESSION MICRODISCECTOMY N/A 04/21/2017   Procedure: BILATERAL LUMBAR FOUR- LUMBAR FIVE AND LEFT LUMBAR FIVE- SACRAL ONE LAMINOTOMY, MEDIAL FACETECTOMY AND FORAMINOTOMIES;  Surgeon: Jovita Gamma, MD;  Location: [facility];  Service: Neurosurgery;  Laterality: N/A;  . NASAL SEPTUM SURGERY      There were no vitals filed for this visit.  Subjective Assessment - 05/24/19 1032    Subjective  Pt. reporting good relief from MT and taping last session.  Feels that knee pain has resolved.  She tried all three standing SLR at home 2 x 5 reps and did not have knee pain yesterday evening or this morning afterwards.    Diagnostic tests  04/17/19 - B hip x-ray negative: There is no evidence of hip fracture or dislocation. There is no evidence of arthropathy or other focal bone abnormality.    Patient Stated Goals  "to get rid of the pain so I can walk more"    Currently in Pain?  No/denies    Pain Score  0-No pain    Multiple Pain Sites  No                       [facility] Adult PT Treatment/Exercise - 05/24/19 0001      Knee/Hip Exercises: Stretches  Piriformis Stretch  Right;Left;30 seconds;2 reps    Piriformis Stretch Limitations  KTOS    Other Knee/Hip Stretches  LTR 3" x 10 reps     Other Knee/Hip Stretches  seated 3-way lumbar stretch with green p-ball UE rollouts x 20 sec    relief from back cramps following this      Knee/Hip Exercises: Aerobic   Recumbent Bike  Lvl 1, 6 min       Knee/Hip Exercises: Supine   Other Supine Knee/Hip Exercises  Alt hip ABD/ER with yellow TB 15 x 3"    Other Supine Knee/Hip Exercises  Alternating hip flexion march with yellow TB at knees x 10 rpes       Knee/Hip Exercises: Sidelying   Clams  R clam with yellow TB x 12 x 2" hold      Manual Therapy   Manual Therapy  Passive ROM    Soft tissue mobilization  STM to R ITB, VL    Myofascial Release  TPR to proximal/mid ITB    Passive ROM   B manual ITB, piriformis stretch x 30 sec                PT Short Term Goals - 05/14/19 1019      PT SHORT TERM GOAL #1   Title  Patient will be independent with initial HEP    Status  On-going    Target Date  05/31/19        PT Long Term Goals - 05/14/19 1019      PT LONG TERM GOAL #1   Title  Patient will be independent with ongoing/advanced HEP    Status  On-going    Target Date  06/21/19      PT LONG TERM GOAL #2   Title  Patient to report B hip pain reduction in frequency and intensity by >/= 50%    Status  On-going    Target Date  06/21/19      PT LONG TERM GOAL #3   Title  Patient will demonstrate improved B LE strength to >/= 4/5 to 4+/5 for improved stability and activity tolerance    Status  On-going    Target Date  06/21/19      PT LONG TERM GOAL #4   Title  Patient will report ability to initiate walking program for exercise at least 3x/week    Status  On-going    Target Date  06/21/19            Plan - 05/24/19 1036    Clinical Impression Statement  Felicia Hall reporting excellent relief from R knee pain with K-taping (which lasted one day) and MT last session.  Notes, "my knee felt great after last time".  Also returned to performing standing 3-way hip kicker hip strengthening activities (2 x 5 reps each way) at home without pain.  Mild progression in proximal hip/ER strengthening in today's visit + continued MT to addressed remaining tightness in R lateral thigh musculature.  Pt. ended session pain free thus deferred further taping or modalities.  Notes hip pain is improving somewhat.  Progressing toward LTG #2.    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, Sjogren's syndrome, HTN, glaucoma    Rehab Potential  Good    PT Treatment/Interventions  ADLs/Self Care Home Management;Cryotherapy;Electrical  Stimulation;Iontophoresis 4mg /ml Dexamethasone;Moist Heat;Ultrasound;Gait training;Functional mobility training;Therapeutic activities;Therapeutic exercise;Neuromuscular re-education;Patient/family education;Manual techniques;Passive range of motion;Dry needling;Joint  Manipulations    PT Next Visit Plan  ITB stretching & LE strengthening; manual therapy for increased muscle tension; modalities as indicated    PT Home Exercise Plan  05/14/19 - KTOS glute/piriformis stretch, hooklying clam with yellow TB, 3-way standing SLR, heel raises, rolling pin self-STM    Consulted and Agree with Plan of Care  Patient       Patient will benefit from skilled therapeutic intervention in order to improve the following deficits and impairments:  Abnormal gait, Decreased activity tolerance, Decreased balance, Decreased endurance, Decreased mobility, Decreased safety awareness, Decreased strength, Difficulty walking, Increased fascial restricitons, Increased muscle spasms, Impaired perceived functional ability, Impaired flexibility, Pain  Visit Diagnosis: Pain in right hip  Pain in left hip  Muscle weakness (generalized)  Difficulty in walking, not elsewhere classified     Problem List Patient Active Problem List   Diagnosis Date Noted  . 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  . Benign essential tremor 01/03/2017  . Irritable bowel syndrome with constipation 08/09/2016  . Neck pain, chronic 08/09/2016  . Spinal stenosis in cervical region 08/09/2016  . Generalized constriction of visual field 06/22/2016  . 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 Perennial allergic rhinitis 10/17/2015  . Chronic pain syndrome 04/30/2013  . Hyperglycemia 04/30/2013  . Low back pain with right-sided sciatica 04/30/2013  . 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 Family history of colonic polyps 04/30/2013  .  Sjogren's syndrome ([place]) 10/27/2012  . 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 Encounter for long-term (current) use of other medications   . Disorder of bone and cartilage, unspecified   . Allergic rhinitis due to pollen   . Cervical spondylosis without myelopathy   . 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 Benign essential hypertension   . Mixed hyperlipidemia     Bess Harvest, [place] 05/24/19 12:16 PM   [facility] [address]  [place], [place], [address] Phone: [phone]   Fax:  [phone]  Name: Felicia Hall MRN: [id number] Date of Birth: 08/08/1945

## 2019-05-28 ENCOUNTER — Other Ambulatory Visit: Payer: Self-pay

## 2019-05-28 ENCOUNTER — Encounter: Payer: Self-pay | Admitting: Physical Therapy

## 2019-05-28 ENCOUNTER — Ambulatory Visit: Payer: Medicare Other | Admitting: Physical Therapy

## 2019-05-28 DIAGNOSIS — M6281 Muscle weakness (generalized): Secondary | ICD-10-CM

## 2019-05-28 DIAGNOSIS — M25552 Pain in left hip: Secondary | ICD-10-CM

## 2019-05-28 DIAGNOSIS — R262 Difficulty in walking, not elsewhere classified: Secondary | ICD-10-CM | POA: Diagnosis not present

## 2019-05-28 DIAGNOSIS — M25551 Pain in right hip: Secondary | ICD-10-CM | POA: Diagnosis not present

## 2019-05-28 NOTE — Therapy (Signed)
[facility] [address]  [place], [place], [address] Phone: [phone]   Fax:  [phone]  Physical Therapy Treatment  Patient Details  Name: Felicia Hall MRN: [id number] Date of Birth: [birth year] Referring Provider (PT): Mcarthur Rossetti, MD   Encounter Date: 05/28/2019  PT End of Session - 05/28/19 1018    Visit Number  6    Number of Visits  12    Date for PT Re-Evaluation  06/21/19    Authorization Type  [employer]HC Medicare    PT Start Time  1018    PT Stop Time  1102    PT Time Calculation (min)  44 min    Activity Tolerance  Patient tolerated treatment well    Behavior During Therapy  [facility] for tasks assessed/performed       Past Medical History:  Diagnosis Date  . Acute upper respiratory infections of unspecified site   . Allergic rhinitis due to pollen   . Benign essential hypertension   . Cervical spondylosis without myelopathy   . Cervicalgia   . Diaphragmatic hernia without mention of obstruction or gangrene   . Disorder of bone and cartilage, unspecified   . Diverticulosis of colon (without mention of hemorrhage)   . Encounter for long-term (current) use of other medications   . GERD (gastroesophageal reflux disease)   . History of hiatal hernia   . Hyperlipidemia LDL goal < 100   . 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 Other and unspecified hyperlipidemia   . Pain in joint, site unspecified   . Pre-diabetes   . Routine general medical examination at a health care facility   . Sjogren's syndrome ([place]) 10/27/2012  . Tear film insufficiency, unspecified   . Unspecified disorder of kidney and ureter   . Unspecified glaucoma(365.9)     Past Surgical History:  Procedure Laterality Date  . BELPHAROPTOSIS REPAIR     brow lift   bil  . CATARACT EXTRACTION W/ INTRAOCULAR LENS  IMPLANT, BILATERAL Bilateral 12/2014   Dr. Prudencio Burly  . [place]   .  DILATION AND CURETTAGE OF UTERUS    . LUMBAR LAMINECTOMY/DECOMPRESSION MICRODISCECTOMY N/A 04/21/2017   Procedure: BILATERAL LUMBAR FOUR- LUMBAR FIVE AND LEFT LUMBAR FIVE- SACRAL ONE LAMINOTOMY, MEDIAL FACETECTOMY AND FORAMINOTOMIES;  Surgeon: Jovita Gamma, MD;  Location: [place];  Service: Neurosurgery;  Laterality: N/A;  . NASAL SEPTUM SURGERY      There were no vitals filed for this visit.  Subjective Assessment - 05/28/19 1023    Subjective  Pt reporting increased pain in B hips after having to to walk a long way at [facility] yesterday - pain resolved after she got home and was able to rest.    Diagnostic tests  04/17/19 - B hip x-ray negative: There is no evidence of hip fracture or dislocation. There is no evidence of arthropathy or other focal bone abnormality.    Patient Stated Goals  "to get rid of the pain so I can walk more"    Currently in Pain?  No/denies                       [facility] Adult PT Treatment/Exercise - 05/28/19 1018      Knee/Hip Exercises: Aerobic   Nustep  L3 x 6 min (UE/LE)   seat #11     Knee/Hip Exercises: Supine   Bridges  Both;5 reps;2 sets;Strengthening  Bridges Limitations  + hip ABD isometric with yellow TB; cues to avoid digging in with heels to prevent HS cramping   R HS cramp on last rep 1st set   Other Supine Knee/Hip Exercises  Alternating hip flexion march with yellow TB at knees x 10 rpes       Manual Therapy   Manual Therapy  Soft tissue mobilization;Myofascial release;Passive ROM    Manual therapy comments  supine    Soft tissue mobilization  STM to B TFL, ITB & glutes, L hip adductors/gracilis along with R HS after cramping during bridges    Myofascial Release  manual TPR to B TFL, ITB & glutes, Lhip addcutors & gracilis    Passive ROM  B manual ITB, piriformis stretch x 30 sec                PT Short Term Goals - 05/14/19 1019      PT SHORT TERM GOAL #1   Title  Patient will be independent with initial HEP     Status  On-going    Target Date  05/31/19        PT Long Term Goals - 05/14/19 1019      PT LONG TERM GOAL #1   Title  Patient will be independent with ongoing/advanced HEP    Status  On-going    Target Date  06/21/19      PT LONG TERM GOAL #2   Title  Patient to report B hip pain reduction in frequency and intensity by >/= 50%    Status  On-going    Target Date  06/21/19      PT LONG TERM GOAL #3   Title  Patient will demonstrate improved B LE strength to >/= 4/5 to 4+/5 for improved stability and activity tolerance    Status  On-going    Target Date  06/21/19      PT LONG TERM GOAL #4   Title  Patient will report ability to initiate walking program for exercise at least 3x/week    Status  On-going    Target Date  06/21/19            Plan - 05/28/19 1102    Clinical Impression Statement  Omnia noting good benefit with relief of last week's knee pain from manual therapy but still having some ttp in L hip adductors with use of rolling pin at home. Also noting increased B lateral hip pain after having to walk a long distance into [employer] on Sunday. Addressed increased muscle tension in L hip adductors as well B TFL, ITB and glutes with good relief noted along with decreased ttp following manual therapy. Manual therapy followed by progression of strengthening exercises but limited initial tolerance for bridges due to R HS cramping (relieved with further manual STM and able to complete 2nd set w/o recurrence).    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, Sjogren's syndrome, HTN, glaucoma    Examination-Activity Limitations  Bed Mobility;Sleep;Sit;Stand;Locomotion Level    Examination-Participation Restrictions  Community  Activity;Shop;Cleaning;Meal Prep    Rehab Potential  Good    PT Frequency  2x / week    PT Duration  6 weeks    PT Treatment/Interventions  ADLs/Self Care Home Management;Cryotherapy;Electrical Stimulation;Iontophoresis 4mg /ml Dexamethasone;Moist Heat;Ultrasound;Gait training;Functional mobility training;Therapeutic activities;Therapeutic exercise;Neuromuscular re-education;Patient/family education;Manual techniques;Passive range  of motion;Dry needling;Joint Manipulations    PT Next Visit Plan  ITB stretching & LE strengthening; manual therapy for increased muscle tension; modalities as indicated    PT Home Exercise Plan  05/14/19 - KTOS glute/piriformis stretch, hooklying clam with yellow TB, 3-way standing SLR, heel raises, rolling pin self-STM    Consulted and Agree with Plan of Care  Patient       Patient will benefit from skilled therapeutic intervention in order to improve the following deficits and impairments:  Abnormal gait, Decreased activity tolerance, Decreased balance, Decreased endurance, Decreased mobility, Decreased safety awareness, Decreased strength, Difficulty walking, Increased fascial restricitons, Increased muscle spasms, Impaired perceived functional ability, Impaired flexibility, Pain  Visit Diagnosis: Pain in right hip  Pain in left hip  Muscle weakness (generalized)  Difficulty in walking, not elsewhere classified     Problem List Patient Active Problem List   Diagnosis Date Noted  . 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  . Benign essential tremor 01/03/2017  . Irritable bowel syndrome with constipation 08/09/2016  . Neck pain, chronic 08/09/2016  . Spinal stenosis in cervical region 08/09/2016  . Generalized constriction of visual field 06/22/2016  . 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 Perennial allergic rhinitis 10/17/2015  . Chronic  pain syndrome 04/30/2013  . Hyperglycemia 04/30/2013  . Low back pain with right-sided sciatica 04/30/2013  . 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 Family history of colonic polyps 04/30/2013  . Sjogren's syndrome ([place]) 10/27/2012  . 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 Encounter for long-term (current) use of other medications   . Disorder of bone and cartilage, unspecified   . Allergic rhinitis due to pollen   . Cervical spondylosis without myelopathy   . 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 Benign essential hypertension   . Mixed hyperlipidemia     Percival Spanish, PT, MPT 05/28/2019, 2:27 PM  [facility] [address]  [place], [place], [address] Phone: [phone]   Fax:  [phone]  Name: Felicia Hall MRN: [id number] Date of Birth: 1945/12/31

## 2019-06-01 ENCOUNTER — Ambulatory Visit: Payer: Medicare Other

## 2019-06-01 ENCOUNTER — Other Ambulatory Visit: Payer: Self-pay

## 2019-06-01 DIAGNOSIS — M25552 Pain in left hip: Secondary | ICD-10-CM

## 2019-06-01 DIAGNOSIS — M6281 Muscle weakness (generalized): Secondary | ICD-10-CM | POA: Diagnosis not present

## 2019-06-01 DIAGNOSIS — M25551 Pain in right hip: Secondary | ICD-10-CM | POA: Diagnosis not present

## 2019-06-01 DIAGNOSIS — R262 Difficulty in walking, not elsewhere classified: Secondary | ICD-10-CM | POA: Diagnosis not present

## 2019-06-01 NOTE — Therapy (Signed)
[facility] [address]  [place], [place], [address] Phone: [phone]   Fax:  [phone]  Physical Therapy Treatment  Patient Details  Name: Felicia Hall MRN: [id number] Date of Birth: 1945-05-19 Referring Provider (PT): Mcarthur Rossetti, MD   Encounter Date: 06/01/2019  PT End of Session - 06/01/19 1106    Visit Number  7    Number of Visits  12    Date for PT Re-Evaluation  06/21/19    Authorization Type  UHC Medicare    PT Start Time  1059    PT Stop Time  1155    PT Time Calculation (min)  56 min    Activity Tolerance  Patient tolerated treatment well    Behavior During Therapy  [facility] for tasks assessed/performed       Past Medical History:  Diagnosis Date  . Acute upper respiratory infections of unspecified site   . Allergic rhinitis due to pollen   . Benign essential hypertension   . Cervical spondylosis without myelopathy   . Cervicalgia   . Diaphragmatic hernia without mention of obstruction or gangrene   . Disorder of bone and cartilage, unspecified   . Diverticulosis of colon (without mention of hemorrhage)   . Encounter for long-term (current) use of other medications   . GERD (gastroesophageal reflux disease)   . History of hiatal hernia   . Hyperlipidemia LDL goal < 100   . 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 Other and unspecified hyperlipidemia   . Pain in joint, site unspecified   . Pre-diabetes   . Routine general medical examination at a health care facility   . Sjogren's syndrome ([place]) 10/27/2012  . Tear film insufficiency, unspecified   . Unspecified disorder of kidney and ureter   . Unspecified glaucoma(365.9)     Past Surgical History:  Procedure Laterality Date  . BELPHAROPTOSIS REPAIR     brow lift   bil  . CATARACT EXTRACTION W/ INTRAOCULAR LENS  IMPLANT, BILATERAL Bilateral 12/2014   Dr. Prudencio Burly  . [place]   .  DILATION AND CURETTAGE OF UTERUS    . LUMBAR LAMINECTOMY/DECOMPRESSION MICRODISCECTOMY N/A 04/21/2017   Procedure: BILATERAL LUMBAR FOUR- LUMBAR FIVE AND LEFT LUMBAR FIVE- SACRAL ONE LAMINOTOMY, MEDIAL FACETECTOMY AND FORAMINOTOMIES;  Surgeon: Jovita Gamma, MD;  Location: [facility];  Service: Neurosurgery;  Laterality: N/A;  . NASAL SEPTUM SURGERY      There were no vitals filed for this visit.  Subjective Assessment - 06/01/19 1102    Subjective  Pt. noting it is getting easier to walk up hill after getting mail back to her house.    Diagnostic tests  04/17/19 - B hip x-ray negative: There is no evidence of hip fracture or dislocation. There is no evidence of arthropathy or other focal bone abnormality.    Patient Stated Goals  "to get rid of the pain so I can walk more"    Currently in Pain?  No/denies    Pain Score  0-No pain    Pain Location  Hip   not feeling hip pain since last Sunday.   Pain Orientation  Right;Left    Pain Descriptors / Indicators  Aching    Pain Type  Chronic pain    Multiple Pain Sites  Yes    Pain Score  1    Pain Location  Knee    Pain Orientation  Right;Lateral  Pain Descriptors / Indicators  Sore    Pain Type  Acute pain;Chronic pain    Pain Frequency  Intermittent    Aggravating Factors   squatting or kneeling    Pain Score  3    Pain Location  Groin    Pain Orientation  Left;Medial    Pain Descriptors / Indicators  Sore                       [facility] Adult PT Treatment/Exercise - 06/01/19 0001      Self-Care   Self-Care  Other Self-Care Comments    Other Self-Care Comments   Heavy cueing with LE stretching (ITB, HS) with strap in supine for relaxation of LE musculature to avoid hip flexor cramping and rationale behind this with improved tolerance following       Knee/Hip Exercises: Stretches   Passive Hamstring Stretch  Right;Left;30 seconds;1 rep   some cramping intermittent in hip flexors    Passive Hamstring Stretch Limitations   supine with strap; opp LE bent    Cues pt. to use UE to support LE   ITB Stretch  Right;Left;1 rep;30 seconds    ITB Stretch Limitations  supine; with strap     Piriformis Stretch  Right;Left;2 reps;30 seconds    Piriformis Stretch Limitations  B figure-4 seated       Knee/Hip Exercises: Aerobic   Recumbent Bike  Lvl 1, 6 min       Knee/Hip Exercises: Standing   Forward Step Up  Right;10 reps;Step Height: 6";Hand Hold: 1;Left    Forward Step Up Limitations  counter support       Knee/Hip Exercises: Supine   Bridges  Both;10 reps;1 set;Strengthening   well tolerated    Bridges Limitations  + hip ABD isometric with yellow TB    Other Supine Knee/Hip Exercises  Alt hip ABD/ER with yellow TB 15 x 3"      Manual Therapy   Manual Therapy  Soft tissue mobilization;Myofascial release    Manual therapy comments  hooklying with L LE leaning into groin stretch with therapist support    Soft tissue mobilization  STM/DTM to L groing - TPs noted x 2 mid and proximal     Myofascial Release  Manual TPR to L gracilis, L proximal groin     Passive ROM  B ITB, HS stretch with therapist    educated pt. on self- stretching for HS and ITB            PT Education - 06/01/19 1208    Education Details  HEP update; 3-way hip kicker with yellow looped TB around ankles (Pt. instructed to perform with band when she feels ready); seated figure-4 stretch    Person(s) Educated  Patient    Methods  Explanation;Demonstration;Verbal cues;Handout    Comprehension  Verbalized understanding;Returned demonstration;Verbal cues required       PT Short Term Goals - 06/01/19 1107      PT SHORT TERM GOAL #1   Title  Patient will be independent with initial HEP    Status  Achieved    Target Date  05/31/19        PT Long Term Goals - 06/01/19 1142      PT LONG TERM GOAL #1   Title  Patient will be independent with ongoing/advanced HEP    Status  On-going      PT LONG TERM GOAL #2   Title  Patient to  report B  hip pain reduction in frequency and intensity by >/= 50%    Status  Achieved   06/01/19: pt. noting 50% improvement in B hip pain since starting therapy     PT LONG TERM GOAL #3   Title  Patient will demonstrate improved B LE strength to >/= 4/5 to 4+/5 for improved stability and activity tolerance    Status  On-going      PT LONG TERM GOAL #4   Title  Patient will report ability to initiate walking program for exercise at least 3x/week    Status  On-going            Plan - 06/01/19 1107    Clinical Impression Statement  Pt. reporting that she is performing HEP daily.  Denies questions regarding HEP.  STG #1 met.  Notes 3-way hip kicker has been getting easier.  Pt. encouraged to use yellow looped TB at ankles or above knees to progress 3-way standing hip kicker however instructed to only start this when she feels ready.  Heavy instruction today on self-stretching with strap for ITB, HS to encouraged UE support and relax LE musculature to avoid cramping.  Pt. with improved tolerance and good carryover following instruction.  Progressed to 6" forward step-up and MT addressed ongoing L groin tenderness with multiple TPs addressed with TPR with reduction in tenderness following.  Reports 50% reduction in hip pain frequency and intensity since starting physical therapy.  LTG #2 met.  Pt. left session pain free.    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, Sjogren's syndrome, HTN, glaucoma    Rehab Potential  Good    PT Treatment/Interventions  ADLs/Self Care Home Management;Cryotherapy;Electrical Stimulation;Iontophoresis 83m/ml Dexamethasone;Moist Heat;Ultrasound;Gait training;Functional mobility training;Therapeutic activities;Therapeutic exercise;Neuromuscular re-education;Patient/family education;Manual techniques;Passive range of motion;Dry  needling;Joint Manipulations    PT Next Visit Plan  Monitor if pt. has tried 3-way hip kicker with yellow TB at ankles (pt. instructed to try when she is ready); ITB stretching & LE strengthening; manual therapy for increased muscle tension; modalities as indicated    PT Home Exercise Plan  05/14/19 - KTOS glute/piriformis stretch, hooklying clam with yellow TB, 3-way standing SLR, heel raises, rolling pin self-STM; 06/01/19 - seated figure-4 stretch, pt. issued yellow TB for 3-way hip kicker and instructed to perform when she is ready    Consulted and Agree with Plan of Care  Patient       Patient will benefit from skilled therapeutic intervention in order to improve the following deficits and impairments:  Abnormal gait, Decreased activity tolerance, Decreased balance, Decreased endurance, Decreased mobility, Decreased safety awareness, Decreased strength, Difficulty walking, Increased fascial restricitons, Increased muscle spasms, Impaired perceived functional ability, Impaired flexibility, Pain  Visit Diagnosis: Pain in right hip  Pain in left hip  Muscle weakness (generalized)  Difficulty in walking, not elsewhere classified     Problem List Patient Active Problem List   Diagnosis Date Noted  . 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  . Benign essential tremor 01/03/2017  . Irritable bowel syndrome with constipation 08/09/2016  . Neck pain, chronic 08/09/2016  . Spinal stenosis in cervical region 08/09/2016  . Generalized constriction of visual field 06/22/2016  . 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 Perennial allergic rhinitis 10/17/2015  . Chronic pain syndrome 04/30/2013  . Hyperglycemia 04/30/2013  . Low back pain with right-sided  sciatica 04/30/2013  . 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 Family history of colonic polyps 04/30/2013  . Sjogren's syndrome ([place])  10/27/2012  . 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 Encounter for long-term (current) use of other medications   . Disorder of bone and cartilage, unspecified   . Allergic rhinitis due to pollen   . Cervical spondylosis without myelopathy   . 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 Benign essential hypertension   . Mixed hyperlipidemia     Felicia Hall, [place] 06/01/19 12:19 PM   [facility] [address]  [place], [place], [address] Phone: [phone]   Fax:  [phone]  Name: Felicia Hall MRN: [id number] Date of Birth: 1945/11/12

## 2019-06-04 ENCOUNTER — Ambulatory Visit: Payer: Medicare Other | Admitting: Physical Therapy

## 2019-06-04 ENCOUNTER — Encounter: Payer: Self-pay | Admitting: Physical Therapy

## 2019-06-04 ENCOUNTER — Other Ambulatory Visit: Payer: Self-pay

## 2019-06-04 DIAGNOSIS — R262 Difficulty in walking, not elsewhere classified: Secondary | ICD-10-CM | POA: Diagnosis not present

## 2019-06-04 DIAGNOSIS — M25552 Pain in left hip: Secondary | ICD-10-CM | POA: Diagnosis not present

## 2019-06-04 DIAGNOSIS — M25551 Pain in right hip: Secondary | ICD-10-CM | POA: Diagnosis not present

## 2019-06-04 DIAGNOSIS — M6281 Muscle weakness (generalized): Secondary | ICD-10-CM

## 2019-06-04 NOTE — Therapy (Signed)
[facility] [place] [address]  [place], [place], [address] Phone: [phone]   Fax:  [phone]  Physical Therapy Treatment / Progress Note  Patient Details  Name: Felicia Hall MRN: [id number] Date of Birth: Sep 08, 1945 Referring Provider (PT): Mcarthur Rossetti, MD  Progress Note  Reporting Period 05/10/2019 to 06/04/2019  See note below for Objective Data and Assessment of Progress/Goals.     Encounter Date: 06/04/2019  PT End of Session - 06/04/19 1019    Visit Number  8    Number of Visits  12    Date for PT Re-Evaluation  06/21/19    Authorization Type  [employer] Medicare    PT Start Time  1019    PT Stop Time  1101    PT Time Calculation (min)  42 min    Activity Tolerance  Patient tolerated treatment well    Behavior During Therapy  WFL for tasks assessed/performed       Past Medical History:  Diagnosis Date  . Acute upper respiratory infections of unspecified site   . Allergic rhinitis due to pollen   . Benign essential hypertension   . Cervical spondylosis without myelopathy   . Cervicalgia   . Diaphragmatic hernia without mention of obstruction or gangrene   . Disorder of bone and cartilage, unspecified   . Diverticulosis of colon (without mention of hemorrhage)   . Encounter for long-term (current) use of other medications   . GERD (gastroesophageal reflux disease)   . History of hiatal hernia   . Hyperlipidemia LDL goal < 100   . 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 Other and unspecified hyperlipidemia   . Pain in joint, site unspecified   . Pre-diabetes   . Routine general medical examination at a health care facility   . Sjogren's syndrome ([place]) 10/27/2012  . Tear film insufficiency, unspecified   . Unspecified disorder of kidney and ureter   . Unspecified glaucoma(365.9)     Past Surgical History:  Procedure Laterality Date  . BELPHAROPTOSIS REPAIR     brow lift    bil  . CATARACT EXTRACTION W/ INTRAOCULAR LENS  IMPLANT, BILATERAL Bilateral 12/2014   Dr. Prudencio Burly  . [place]   . DILATION AND CURETTAGE OF UTERUS    . LUMBAR LAMINECTOMY/DECOMPRESSION MICRODISCECTOMY N/A 04/21/2017   Procedure: BILATERAL LUMBAR FOUR- LUMBAR FIVE AND LEFT LUMBAR FIVE- SACRAL ONE LAMINOTOMY, MEDIAL FACETECTOMY AND FORAMINOTOMIES;  Surgeon: Jovita Gamma, MD;  Location: [facility];  Service: Neurosurgery;  Laterality: N/A;  . NASAL SEPTUM SURGERY      There were no vitals filed for this visit.  Subjective Assessment - 06/04/19 1021    Subjective  Pt noting increased soreness since Friday's therapy session but not so bad that she could not do her HEP over the weekend. She did report some cramping in the L quads while doing her HEP.    Diagnostic tests  04/17/19 - B hip x-ray negative: There is no evidence of hip fracture or dislocation. There is no evidence of arthropathy or other focal bone abnormality.    Patient Stated Goals  "to get rid of the pain so I can walk more"    Currently in Pain?  No/denies         [facility] PT Assessment - 06/04/19 1019      Assessment   Medical Diagnosis  B hip trochanteric bursitis    Referring Provider (PT)  Mcarthur Rossetti, MD    Onset Date/Surgical Date  --   chronic - 1990s   Next MD Visit  06/07/19      Strength   Right Hip Flexion  4-/5   pain   Right Hip Extension  4-/5    Right Hip External Rotation   3+/5    Right Hip Internal Rotation  4-/5   pain   Right Hip ABduction  4/5   pain   Right Hip ADduction  4/5    Left Hip Flexion  4-/5   pain   Left Hip Extension  4-/5    Left Hip External Rotation  3+/5   pain   Left Hip Internal Rotation  4-/5   pain   Left Hip ABduction  4/5    Left Hip ADduction  4/5    Right Knee Flexion  4/5    Right Knee Extension  4-/5    Left Knee Flexion  4/5    Left Knee Extension  4/5    Right Ankle Dorsiflexion  4/5    Right Ankle Plantar Flexion  4/5     Left Ankle Dorsiflexion  4+/5    Left Ankle Plantar Flexion  4/5                   [facility] Adult PT Treatment/Exercise - 06/04/19 1019      Exercises   Exercises  Knee/Hip      Knee/Hip Exercises: Aerobic   Nustep  L4 x 6 min (UE/LE)   seat #11     Knee/Hip Exercises: Seated   Long Arc Quad  Both;10 reps;Strengthening    Long Arc Quad Limitations  looped yellow TB at ankles + hip adduction ball squeeze    Ball Squeeze  10 x 5"    Clamshell with TheraBand  Yellow   alt hip ABD/ER 10 x 3"   Other Seated Knee/Hip Exercises  Ball squeeze + hip IR: R LE x 5 (no resistance, deferred d/t increased R hip pain), L LE x 10 (looped yellow TB at ankles)    Marching  Both;10 reps;Strengthening    Marching Limitations  looped yellow TB at knees    Hamstring Curl  Right;Left;10 reps;Strengthening    Hamstring Limitations  looped yellow TB at ankles with opp foot propped up on 9" stool    discomfort in ipsilateral posterior hip bilaterally     Manual Therapy   Manual Therapy  Soft tissue mobilization;Myofascial release    Manual therapy comments  seated    Soft tissue mobilization  STM to R TFL, ITB & glutes    Myofascial Release  manual TPR to R TFL & glutes               PT Short Term Goals - 06/04/19 1024      PT SHORT TERM GOAL #1   Title  Patient will be independent with initial HEP    Status  Achieved   06/01/19       PT Long Term Goals - 06/04/19 1036      PT LONG TERM GOAL #1   Title  Patient will be independent with ongoing/advanced HEP    Status  Partially Met      PT LONG TERM GOAL #2   Title  Patient to report B hip pain reduction in frequency and intensity by >/= 50%    Status  Achieved   06/01/19 - pt. noting 50% improvement in B hip pain since  starting therapy     PT LONG TERM GOAL #3   Title  Patient will demonstrate improved B LE strength to >/= 4/5 to 4+/5 for improved stability and activity tolerance    Status  Partially Met      PT LONG  TERM GOAL #4   Title  Patient will report ability to initiate walking program for exercise at least 3x/week    Status  On-going            Plan - 06/04/19 1025    Clinical Impression Statement  Felicia Hall noting ~50% reduction in pain since starting PT along with improving activity tolerance with decreased pain interference - things such as walk up inclined driveway after getting her mail is now easier with less pain. She remains slow to tolerate exercise progression as she tends to easily become very sore with new exercises, but overall LE strength has improved by ~1/2 MMT grade on average. STG met and good progress observed for most LTGs (at least partially met) with LTG #2 (pain frequency and intensity) met and only LTG #4 (walking program) not yet addressed. Given above gains, patient encouraged to initiate the home walking program she had desired as part of her therapy goals, with initial plan to start with [place] walks [place]. Recommend continued PT to address remaining strength deficits and continue pain management as indicated to further improve activity tolerance with decreased pain interference.    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, Sjogren's syndrome, HTN, glaucoma    Examination-Activity Limitations  Bed Mobility;Sleep;Sit;Stand;Locomotion Level    Examination-Participation Restrictions  Community Activity;Shop;Cleaning;Meal Prep    Rehab Potential  Good    PT Frequency  2x / week    PT Duration  6 weeks    PT Treatment/Interventions  ADLs/Self Care Home Management;Cryotherapy;Electrical Stimulation;Iontophoresis 25m/ml Dexamethasone;Moist Heat;Ultrasound;Gait training;Functional mobility training;Therapeutic  activities;Therapeutic exercise;Neuromuscular re-education;Patient/family education;Manual techniques;Passive range of motion;Dry needling;Joint Manipulations    PT Next Visit Plan  ITB stretching & LE strengthening; manual therapy for increased muscle tension; modalities as indicated    PT Home Exercise Plan  05/14/19 - KTOS glute/piriformis stretch, hooklying clam with yellow TB, 3-way standing SLR, heel raises, rolling pin self-STM    Consulted and Agree with Plan of Care  Patient       Patient will benefit from skilled therapeutic intervention in order to improve the following deficits and impairments:  Abnormal gait, Decreased activity tolerance, Decreased balance, Decreased endurance, Decreased mobility, Decreased safety awareness, Decreased strength, Difficulty walking, Increased fascial restricitons, Increased muscle spasms, Impaired perceived functional ability, Impaired flexibility, Pain  Visit Diagnosis: Pain in right hip  Pain in left hip  Muscle weakness (generalized)  Difficulty in walking, not elsewhere classified     Problem List Patient Active Problem List   Diagnosis Date Noted  . 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  . Benign essential tremor 01/03/2017  . Irritable bowel syndrome with constipation 08/09/2016  . Neck pain, chronic 08/09/2016  . Spinal stenosis in cervical region 08/09/2016  . Generalized constriction of visual field 06/22/2016  . 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 Perennial allergic rhinitis 10/17/2015  . Chronic pain syndrome 04/30/2013  . Hyperglycemia 04/30/2013  . Low back pain with right-sided sciatica  04/30/2013  . 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 Family history of colonic polyps 04/30/2013  . Sjogren's syndrome ([place]) 10/27/2012  . 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 Encounter for long-term  (current) use of other medications   . Disorder of bone and cartilage, unspecified   . Allergic rhinitis due to pollen   . Cervical spondylosis without myelopathy   . 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 Benign essential hypertension   . Mixed hyperlipidemia     Percival Spanish, PT, MPT 06/04/2019, 8:44 PM  [facility] [address]  [place], [place], [address] Phone: [phone]   Fax:  [phone]  Name: Felicia Hall MRN: [id number] Date of Birth: 29-Mar-1946

## 2019-06-05 DIAGNOSIS — M5136 Other intervertebral disc degeneration, lumbar region: Secondary | ICD-10-CM | POA: Diagnosis not present

## 2019-06-05 DIAGNOSIS — M5134 Other intervertebral disc degeneration, thoracic region: Secondary | ICD-10-CM | POA: Diagnosis not present

## 2019-06-05 DIAGNOSIS — M9903 Segmental and somatic dysfunction of lumbar region: Secondary | ICD-10-CM | POA: Diagnosis not present

## 2019-06-05 DIAGNOSIS — M9902 Segmental and somatic dysfunction of thoracic region: Secondary | ICD-10-CM | POA: Diagnosis not present

## 2019-06-07 ENCOUNTER — Encounter: Payer: Self-pay | Admitting: Orthopaedic Surgery

## 2019-06-07 ENCOUNTER — Encounter: Payer: Self-pay | Admitting: Physical Therapy

## 2019-06-07 ENCOUNTER — Other Ambulatory Visit: Payer: Self-pay

## 2019-06-07 ENCOUNTER — Ambulatory Visit: Payer: Medicare Other | Admitting: Orthopaedic Surgery

## 2019-06-07 ENCOUNTER — Ambulatory Visit: Payer: Medicare Other | Admitting: Physical Therapy

## 2019-06-07 DIAGNOSIS — R262 Difficulty in walking, not elsewhere classified: Secondary | ICD-10-CM | POA: Diagnosis not present

## 2019-06-07 DIAGNOSIS — M6281 Muscle weakness (generalized): Secondary | ICD-10-CM | POA: Diagnosis not present

## 2019-06-07 DIAGNOSIS — M25551 Pain in right hip: Secondary | ICD-10-CM

## 2019-06-07 DIAGNOSIS — M7062 Trochanteric bursitis, left hip: Secondary | ICD-10-CM | POA: Diagnosis not present

## 2019-06-07 DIAGNOSIS — M25552 Pain in left hip: Secondary | ICD-10-CM

## 2019-06-07 DIAGNOSIS — M7061 Trochanteric bursitis, right hip: Secondary | ICD-10-CM

## 2019-06-07 NOTE — Progress Notes (Signed)
The patient is being seen in follow-up after going to physical therapy for chronic bilateral hip trochanteric bursitis.  She has been very pleased with that therapy has been able to do for her.  She still has pain of the trochanteric areas but she can tell a difference from therapy and she states her legs are stronger.  She is also been using Voltaren gel.  She is trying to do at home with therapy has taught her.  She has about 3 more therapy visits.  She cannot tolerate steroids due to severe glaucoma.  On exam both hips move fluidly and smoothly with no pain in the groin at all.  She only has some mild pain to palpation of the trochanteric area and the proximal IT band on both hips.  At this point since she is doing so well follow-up can be as needed.  If she ends up needing more therapy she will let us know.  All questions and concerns were answered and addressed.  She will continue home exercise routine as well.

## 2019-06-07 NOTE — Patient Instructions (Signed)
    Home exercise program created by Hogan Hoobler, PT.  For questions, please contact Rileigh Kawashima via phone at [phone] or email at [email or web address]   [facility] [address]  [address] [place], [place], [address] Phone: [phone]   Fax:  [phone]    

## 2019-06-07 NOTE — Therapy (Signed)
[place] [address]  [place], [place], [address] Phone: [phone]   Fax:  [phone]  Physical Therapy Treatment  Patient Details  Name: Felicia Hall MRN: [id number] Date of Birth: 03/11/1946 Referring Provider (PT): Mcarthur Rossetti, MD   Encounter Date: 06/07/2019  PT End of Session - 06/07/19 1021    Visit Number  9    Number of Visits  12    Date for PT Re-Evaluation  06/21/19    Authorization Type  [employer]HC Medicare    Progress Note Due on Visit  12    PT Start Time  1021    PT Stop Time  1103    PT Time Calculation (min)  42 min    Activity Tolerance  Patient tolerated treatment well    Behavior During Therapy  [facility] for tasks assessed/performed       Past Medical History:  Diagnosis Date  . Acute upper respiratory infections of unspecified site   . Allergic rhinitis due to pollen   . Benign essential hypertension   . Cervical spondylosis without myelopathy   . Cervicalgia   . Diaphragmatic hernia without mention of obstruction or gangrene   . Disorder of bone and cartilage, unspecified   . Diverticulosis of colon (without mention of hemorrhage)   . Encounter for long-term (current) use of other medications   . GERD (gastroesophageal reflux disease)   . History of hiatal hernia   . Hyperlipidemia LDL goal < 100   . 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 Other and unspecified hyperlipidemia   . Pain in joint, site unspecified   . Pre-diabetes   . Routine general medical examination at a health care facility   . Sjogren's syndrome ([place]) 10/27/2012  . Tear film insufficiency, unspecified   . Unspecified disorder of kidney and ureter   . Unspecified glaucoma(365.9)     Past Surgical History:  Procedure Laterality Date  . BELPHAROPTOSIS REPAIR     brow lift   bil  . CATARACT EXTRACTION W/ INTRAOCULAR LENS  IMPLANT, BILATERAL Bilateral 12/2014   Dr. Prudencio Burly  .  [place]   . DILATION AND CURETTAGE OF UTERUS    . LUMBAR LAMINECTOMY/DECOMPRESSION MICRODISCECTOMY N/A 04/21/2017   Procedure: BILATERAL LUMBAR FOUR- LUMBAR FIVE AND LEFT LUMBAR FIVE- SACRAL ONE LAMINOTOMY, MEDIAL FACETECTOMY AND FORAMINOTOMIES;  Surgeon: Jovita Gamma, MD;  Location: [facility];  Service: Neurosurgery;  Laterality: N/A;  . NASAL SEPTUM SURGERY      There were no vitals filed for this visit.  Subjective Assessment - 06/07/19 1024    Subjective  Pt very pleased as she had to do some shopping yesterday and was able to walk around the store with very little pain - this used to cause her a lot of pain.    Diagnostic tests  04/17/19 - B hip x-ray negative: There is no evidence of hip fracture or dislocation. There is no evidence of arthropathy or other focal bone abnormality.    Patient Stated Goals  "to get rid of the pain so I can walk more"    Currently in Pain?  No/denies                       [facility] Adult PT Treatment/Exercise - 06/07/19 1021      Exercises   Exercises  Knee/Hip      Knee/Hip Exercises: Stretches   Other Knee/Hip  Stretches  Supine butterfly hip adductor stretch 2 x 30 sec      Knee/Hip Exercises: Aerobic   Nustep  L4 x 6 min (UE/LE)   seat #10     Knee/Hip Exercises: Seated   Marching  Both;10 reps;Strengthening;2 sets    Marching Limitations  looped red TB at knees for first set but increased cramping in R lateral glutes/piriformis, threrefore switched back to yellow TB    Abduction/Adduction   Right;Left;10 reps;Strengthening    Abd/Adduction Limitations  leg hovering just above floor      Knee/Hip Exercises: Supine   Hip Adduction Isometric  Both;10 reps;Strengthening   5" hold   Hip Adduction Isometric Limitations  ball squeeze    Bridges  Both;5 reps;2 sets;Strengthening    Bridges Limitations  + hip ABD isometric with red TB; cues to avoid digging in with heels to prevent HS cramping   R HS cramp on  initial rep   Other Supine Knee/Hip Exercises  Alt hip ABD/ER with red TB 10 x 3"      Manual Therapy   Manual Therapy  Soft tissue mobilization;Myofascial release    Manual therapy comments  hooklying with knee over bolster & legs slightly ER    Soft tissue mobilization  STM/DTM to B hip adductors    Myofascial Release  manual TPR to R lateral glutes & piriformis             PT Education - 06/07/19 1100    Education Details  HEP update - frog leg hip adductor stretch, seated yellow TB march, seated hip ADB/ADD, hooklying clam progressed to red TB    Person(s) Educated  Patient    Methods  Explanation;Demonstration;Handout    Comprehension  Verbalized understanding;Returned demonstration;Need further instruction       PT Short Term Goals - 06/04/19 1024      PT SHORT TERM GOAL #1   Title  Patient will be independent with initial HEP    Status  Achieved   06/01/19       PT Long Term Goals - 06/07/19 1103      PT LONG TERM GOAL #1   Title  Patient will be independent with ongoing/advanced HEP    Status  Partially Met    Target Date  06/21/19      PT LONG TERM GOAL #2   Title  Patient to report B hip pain reduction in frequency and intensity by >/= 50%    Status  Achieved   06/01/19 - pt. noting 50% improvement in B hip pain since starting therapy     PT LONG TERM GOAL #3   Title  Patient will demonstrate improved B LE strength to >/= 4/5 to 4+/5 for improved stability and activity tolerance    Status  Partially Met    Target Date  06/21/19      PT LONG TERM GOAL #4   Title  Patient will report ability to initiate walking program for exercise at least 3x/week    Status  On-going    Target Date  06/21/19            Plan - 06/07/19 1026    Clinical Impression Statement  [name] pleased with her ability to walk through the store while shopping with only minimal pain now, as this used to be very limited by pain. She continues to note tenderness in hip adductor  when using rolling pin on thighs with increased muscle and ttp noted along B hip  adductors (L>R) - better after STM and stretching. She reports she is still unable to perform the standing SLRs with the yellow band due to increased discomfort on stabilizing/stance leg, therefore worked on strengthening progression in supine and seated positions with better tolerance and HEP updated accordingly. Hanna remains prone to cramping easily with exercises, but able to minimize cramping with adjustment of resistance and/or cues for modified technique.    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, Sjogren's syndrome, HTN, glaucoma    Examination-Activity Limitations  Bed Mobility;Sleep;Sit;Stand;Locomotion Level    Examination-Participation Restrictions  Community Activity;Shop;Cleaning;Meal Prep    Rehab Potential  Good    PT Frequency  2x / week    PT Duration  6 weeks    PT Treatment/Interventions  ADLs/Self Care Home Management;Cryotherapy;Electrical Stimulation;Iontophoresis '4mg'$ /ml Dexamethasone;Moist Heat;Ultrasound;Gait training;Functional mobility training;Therapeutic activities;Therapeutic exercise;Neuromuscular re-education;Patient/family education;Manual techniques;Passive range of motion;Dry needling;Joint Manipulations    PT Next Visit Plan  10th visit FOTO (PN completed on 8th visit for MD appt on 06/07/19); LE flexibility/stretching and strengthening; manual therapy for increased muscle tension; modalities as indicated    PT Home Exercise Plan  05/14/19 - KTOS glute/piriformis stretch, hooklying clam with yellow TB, 3-way standing SLR, heel raises, rolling pin self-STM; 06/01/19 - seated figure-4 stretch, yellow TB issued for 3-way hip kicker and pt instructed  to perform when she is ready; 06/07/19 -  frog leg hip adductor stretch, seated yellow TB march, seated hip ADB/ADD, hooklying clam progressed to red TB    Consulted and Agree with Plan of Care  Patient       Patient will benefit from skilled therapeutic intervention in order to improve the following deficits and impairments:  Abnormal gait, Decreased activity tolerance, Decreased balance, Decreased endurance, Decreased mobility, Decreased safety awareness, Decreased strength, Difficulty walking, Increased fascial restricitons, Increased muscle spasms, Impaired perceived functional ability, Impaired flexibility, Pain  Visit Diagnosis: Pain in right hip  Pain in left hip  Muscle weakness (generalized)  Difficulty in walking, not elsewhere classified     Problem List Patient Active Problem List   Diagnosis Date Noted  . 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  . Benign essential tremor 01/03/2017  . Irritable bowel syndrome with constipation 08/09/2016  . Neck pain, chronic 08/09/2016  . Spinal stenosis in cervical region 08/09/2016  . Generalized constriction of visual field 06/22/2016  . 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 Perennial allergic rhinitis 10/17/2015  . Chronic pain syndrome 04/30/2013  . Hyperglycemia 04/30/2013  . Low back pain with right-sided sciatica 04/30/2013  . 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 Family history of colonic polyps 04/30/2013  . Sjogren's syndrome ([place]) 10/27/2012  . 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 Encounter for long-term (current) use of other medications   . Disorder of bone and cartilage, unspecified   . Allergic rhinitis due to pollen   . Cervical spondylosis without myelopathy   . 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 Benign essential hypertension   . Mixed hyperlipidemia     Percival Spanish, PT,  MPT 06/07/2019, 11:31 AM  [facility] [address]  [place], [place], [address] Phone: [phone]   Fax:  [phone]  Name: Felicia Hall MRN: [id number] Date  of Birth: [birth year]

## 2019-06-10 ENCOUNTER — Ambulatory Visit: Payer: Medicare Other | Attending: Internal Medicine

## 2019-06-10 DIAGNOSIS — Z23 Encounter for immunization: Secondary | ICD-10-CM | POA: Insufficient documentation

## 2019-06-10 NOTE — Progress Notes (Signed)
   Covid-19 Vaccination Clinic  Name:  Felicia Hall    MRN: [id number] DOB: Jan 12, 1946  06/10/2019  [name] was observed post Covid-19 immunization for 15 minutes without incidence. She was provided with Vaccine Information Sheet and instruction to access the V-Safe system.   [name] was instructed to call 911 with any severe reactions post vaccine: Marland Kitchen Difficulty breathing  . Swelling of your face and throat  . A fast heartbeat  . A bad rash all over your body  . Dizziness and weakness    Immunizations Administered    Name Date Dose VIS Date Route   Pfizer COVID-19 Vaccine 06/10/2019 11:15 AM 0.3 mL 03/23/2019 Intramuscular   Manufacturer: [employer]   Lot: [id number]   [place]: [id number]

## 2019-06-11 ENCOUNTER — Ambulatory Visit: Payer: Medicare Other | Attending: Orthopaedic Surgery | Admitting: Physical Therapy

## 2019-06-11 ENCOUNTER — Other Ambulatory Visit: Payer: Self-pay

## 2019-06-11 DIAGNOSIS — M6281 Muscle weakness (generalized): Secondary | ICD-10-CM | POA: Diagnosis not present

## 2019-06-11 DIAGNOSIS — M25552 Pain in left hip: Secondary | ICD-10-CM

## 2019-06-11 DIAGNOSIS — R262 Difficulty in walking, not elsewhere classified: Secondary | ICD-10-CM

## 2019-06-11 DIAGNOSIS — M25551 Pain in right hip: Secondary | ICD-10-CM | POA: Insufficient documentation

## 2019-06-11 NOTE — Therapy (Signed)
[facility] [address]  [place], [place], [address] Phone: [phone]   Fax:  [phone]  Physical Therapy Treatment  Patient Details  Name: Felicia Hall MRN: [id number] Date of Birth: 1946-02-15 Referring Provider (PT): Mcarthur Rossetti, MD   Encounter Date: 06/11/2019  PT End of Session - 06/11/19 1012    Visit Number  10    Number of Visits  12    Date for PT Re-Evaluation  06/21/19    Authorization Type  [employer]HC Medicare    Progress Note Due on Visit  12    PT Start Time  1012    PT Stop Time  1056    PT Time Calculation (min)  44 min    Activity Tolerance  Patient tolerated treatment well;Other (comment)   repeated cramping t/o LE musculature   Behavior During Therapy  WFL for tasks assessed/performed       Past Medical History:  Diagnosis Date  . Acute upper respiratory infections of unspecified site   . Allergic rhinitis due to pollen   . Benign essential hypertension   . Cervical spondylosis without myelopathy   . Cervicalgia   . Diaphragmatic hernia without mention of obstruction or gangrene   . Disorder of bone and cartilage, unspecified   . Diverticulosis of colon (without mention of hemorrhage)   . Encounter for long-term (current) use of other medications   . GERD (gastroesophageal reflux disease)   . History of hiatal hernia   . Hyperlipidemia LDL goal < 100   . 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 Other and unspecified hyperlipidemia   . Pain in joint, site unspecified   . Pre-diabetes   . Routine general medical examination at a health care facility   . Sjogren's syndrome ([place]) 10/27/2012  . Tear film insufficiency, unspecified   . Unspecified disorder of kidney and ureter   . Unspecified glaucoma(365.9)     Past Surgical History:  Procedure Laterality Date  . BELPHAROPTOSIS REPAIR     brow lift   bil  . CATARACT EXTRACTION W/ INTRAOCULAR LENS  IMPLANT,  BILATERAL Bilateral 12/2014   Dr. Prudencio Burly  . [place]   . DILATION AND CURETTAGE OF UTERUS    . LUMBAR LAMINECTOMY/DECOMPRESSION MICRODISCECTOMY N/A 04/21/2017   Procedure: BILATERAL LUMBAR FOUR- LUMBAR FIVE AND LEFT LUMBAR FIVE- SACRAL ONE LAMINOTOMY, MEDIAL FACETECTOMY AND FORAMINOTOMIES;  Surgeon: Jovita Gamma, MD;  Location: [facility];  Service: Neurosurgery;  Laterality: N/A;  . NASAL SEPTUM SURGERY      There were no vitals filed for this visit.  Subjective Assessment - 06/11/19 1015    Subjective  Pt reporting she was able to walk into the [employer] to get her first COVID vaccine on Sunday and did not have any issue walking from the parking lot or through the event center. Was able to go home and do her exercises w/o problem but does note some medial and lateral thigh soreness today.    Diagnostic tests  04/17/19 - B hip x-ray negative: There is no evidence of hip fracture or dislocation. There is no evidence of arthropathy or other focal bone abnormality.    Patient Stated Goals  "to get rid of the pain so I can walk more"    Currently in Pain?  Yes    Pain Score  4    3-4/10   Pain Location  Leg    Pain Orientation  Right;Left;Upper;Medial;Lateral    Pain Descriptors / Indicators  Sore;Aching    Pain Type  Chronic pain    Pain Frequency  Intermittent                       [facility] Adult PT Treatment/Exercise - 06/11/19 1012      Exercises   Exercises  Knee/Hip      Knee/Hip Exercises: Stretches   ITB Stretch  Right;Left;30 seconds;1 rep    ITB Stretch Limitations  supine with strap - poor tolerance    Piriformis Stretch  Right;Left;30 seconds;2 reps    Piriformis Stretch Limitations  supine KTOS - more sensitive on L    Other Knee/Hip Stretches  B seated hip adductor lunge stretch x 30 sec      Knee/Hip Exercises: Aerobic   Nustep  L4 x 6 min (UE/LE)   seat #10     Knee/Hip Exercises: Supine   Short Arc Quad Sets  Right;Left;10  reps;AROM;Strengthening    Short Arc Quad Sets Limitations  over 8" FR    Straight Leg Raises Limitations  unable - cramping in hip flexors and proximal quads on attempt    Other Supine Knee/Hip Exercises  B brace marching x 10 each      Manual Therapy   Manual Therapy  Soft tissue mobilization;Myofascial release;Passive ROM    Manual therapy comments  supine & hooklying    Soft tissue mobilization  STM/DTM to B glutes, piriformis, HS, quads, hip flexors, hip adductors & ITB    Myofascial Release  manual TPR/cramp massage as issues arose during therapy session - B glutes, piriformis, HS, quads, hip flexors, hip adductors & ITB    Passive ROM  gentle manual stretching for B HS & ITB               PT Short Term Goals - 06/04/19 1024      PT SHORT TERM GOAL #1   Title  Patient will be independent with initial HEP    Status  Achieved   06/01/19       PT Long Term Goals - 06/07/19 1103      PT LONG TERM GOAL #1   Title  Patient will be independent with ongoing/advanced HEP    Status  Partially Met    Target Date  06/21/19      PT LONG TERM GOAL #2   Title  Patient to report B hip pain reduction in frequency and intensity by >/= 50%    Status  Achieved   06/01/19 - pt. noting 50% improvement in B hip pain since starting therapy     PT LONG TERM GOAL #3   Title  Patient will demonstrate improved B LE strength to >/= 4/5 to 4+/5 for improved stability and activity tolerance    Status  Partially Met    Target Date  06/21/19      PT LONG TERM GOAL #4   Title  Patient will report ability to initiate walking program for exercise at least 3x/week    Status  On-going    Target Date  06/21/19            Plan - 06/11/19 1056    Clinical Impression Statement  Felicia Hall arriving to PT reporting good walking tolerance yesterday while at the [facility] for her first round COVID vaccine and still able to complete her HEP upon returning home but noting increased medial and lateral  thigh tenderness this morning. Increased  muscle tension with multiple TPs identified and address with STM and manual TPR, however still with limited exercise tolerance today as many exercises or stretches attempted would bring on muscle spasms or cramps in multiple proximal LE muscles, potentially a result of COVID vaccine. Each was addressed with further manual therapy as indicated with patient reporting no tenderness by end of session. Patient instructed to defer HEP for remainder of today and ease back into exercises tomorrow as tolerated.    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, Sjogren's syndrome, HTN, glaucoma    Examination-Activity Limitations  Bed Mobility;Sleep;Sit;Stand;Locomotion Level    Examination-Participation Restrictions  Community Activity;Shop;Cleaning;Meal Prep    Rehab Potential  Good    PT Frequency  2x / week    PT Duration  6 weeks    PT Treatment/Interventions  ADLs/Self Care Home Management;Cryotherapy;Electrical Stimulation;Iontophoresis 21m/ml Dexamethasone;Moist Heat;Ultrasound;Gait training;Functional mobility training;Therapeutic activities;Therapeutic exercise;Neuromuscular re-education;Patient/family education;Manual techniques;Passive range of motion;Dry needling;Joint Manipulations    PT Next Visit Plan  LE flexibility/stretching and strengthening; manual therapy for increased muscle tension; modalities as indicated    PT Home Exercise Plan  05/14/19 - KTOS glute/piriformis stretch, hooklying clam with yellow TB, 3-way standing SLR, heel raises, rolling pin self-STM; 06/01/19 - seated figure-4 stretch, yellow TB issued for 3-way hip kicker and pt instructed to perform when she is ready; 06/07/19 -  frog leg hip adductor  stretch, seated yellow TB march, seated hip ADB/ADD, hooklying clam progressed to red TB    Consulted and Agree with Plan of Care  Patient       Patient will benefit from skilled therapeutic intervention in order to improve the following deficits and impairments:  Abnormal gait, Decreased activity tolerance, Decreased balance, Decreased endurance, Decreased mobility, Decreased safety awareness, Decreased strength, Difficulty walking, Increased fascial restricitons, Increased muscle spasms, Impaired perceived functional ability, Impaired flexibility, Pain  Visit Diagnosis: Pain in right hip  Pain in left hip  Muscle weakness (generalized)  Difficulty in walking, not elsewhere classified     Problem List Patient Active Problem List   Diagnosis Date Noted  . 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  . Benign essential tremor 01/03/2017  . Irritable bowel syndrome with constipation 08/09/2016  . Neck pain, chronic 08/09/2016  . Spinal stenosis in cervical region 08/09/2016  . Generalized constriction of visual field 06/22/2016  . 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 Perennial allergic rhinitis 10/17/2015  . Chronic pain syndrome 04/30/2013  . Hyperglycemia 04/30/2013  . Low back pain with right-sided sciatica 04/30/2013  . 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 Family history of colonic polyps 04/30/2013  . Sjogren's syndrome ([place] 10/27/2012  . 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 Encounter for long-term (current) use of other medications   . Disorder of bone and cartilage, unspecified   . Allergic rhinitis due to pollen   . Cervical spondylosis without myelopathy   . 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 Benign essential hypertension   . Mixed hyperlipidemia     JPercival Spanish PT, MPT 06/11/2019, 3:11 PM  [facility] [place] [address]Phone: [phone]  Fax:  [phone] Name: BNATIVIDAD SCHLOSSERMRN: [id number] of Birth: [birth year]

## 2019-06-14 ENCOUNTER — Other Ambulatory Visit: Payer: Self-pay

## 2019-06-14 ENCOUNTER — Ambulatory Visit: Payer: Medicare Other

## 2019-06-14 DIAGNOSIS — M25551 Pain in right hip: Secondary | ICD-10-CM

## 2019-06-14 DIAGNOSIS — M6281 Muscle weakness (generalized): Secondary | ICD-10-CM

## 2019-06-14 DIAGNOSIS — M25552 Pain in left hip: Secondary | ICD-10-CM | POA: Diagnosis not present

## 2019-06-14 DIAGNOSIS — R262 Difficulty in walking, not elsewhere classified: Secondary | ICD-10-CM

## 2019-06-14 NOTE — Therapy (Signed)
[facility] [address]  [place], [place], [address] Phone: [phone]   Fax:  [phone]  Physical Therapy Treatment  Patient Details  Name: Felicia Hall MRN: [id number] Date of Birth: Apr 12, 1946 Referring Provider (PT): Mcarthur Rossetti, MD   Encounter Date: 06/14/2019  PT End of Session - 06/14/19 1033    Visit Number  11    Number of Visits  12    Date for PT Re-Evaluation  06/21/19    Authorization Type  UHC Medicare    Progress Note Due on Visit  12    PT Start Time  1018    PT Stop Time  1100    PT Time Calculation (min)  42 min    Activity Tolerance  Patient tolerated treatment well;Other (comment)   repeated cramping t/o LE musculature   Behavior During Therapy  WFL for tasks assessed/performed       Past Medical History:  Diagnosis Date  . Acute upper respiratory infections of unspecified site   . Allergic rhinitis due to pollen   . Benign essential hypertension   . Cervical spondylosis without myelopathy   . Cervicalgia   . Diaphragmatic hernia without mention of obstruction or gangrene   . Disorder of bone and cartilage, unspecified   . Diverticulosis of colon (without mention of hemorrhage)   . Encounter for long-term (current) use of other medications   . GERD (gastroesophageal reflux disease)   . History of hiatal hernia   . Hyperlipidemia LDL goal < 100   . 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 Other and unspecified hyperlipidemia   . Pain in joint, site unspecified   . Pre-diabetes   . Routine general medical examination at a health care facility   . Sjogren's syndrome ([place]) 10/27/2012  . Tear film insufficiency, unspecified   . Unspecified disorder of kidney and ureter   . Unspecified glaucoma(365.9)     Past Surgical History:  Procedure Laterality Date  . BELPHAROPTOSIS REPAIR     brow lift   bil  . CATARACT EXTRACTION W/ INTRAOCULAR LENS  IMPLANT,  BILATERAL Bilateral 12/2014   Dr. Prudencio Burly  . [place]   . DILATION AND CURETTAGE OF UTERUS    . LUMBAR LAMINECTOMY/DECOMPRESSION MICRODISCECTOMY N/A 04/21/2017   Procedure: BILATERAL LUMBAR FOUR- LUMBAR FIVE AND LEFT LUMBAR FIVE- SACRAL ONE LAMINOTOMY, MEDIAL FACETECTOMY AND FORAMINOTOMIES;  Surgeon: Jovita Gamma, MD;  Location: [facility];  Service: Neurosurgery;  Laterality: N/A;  . NASAL SEPTUM SURGERY      There were no vitals filed for this visit.  Subjective Assessment - 06/14/19 1027    Subjective  Pt. reporting that she has seen improved tolerance for HEP.    Diagnostic tests  04/17/19 - B hip x-ray negative: There is no evidence of hip fracture or dislocation. There is no evidence of arthropathy or other focal bone abnormality.    Patient Stated Goals  "to get rid of the pain so I can walk more"    Currently in Pain?  Yes    Pain Score  4     Pain Location  Knee    Pain Orientation  Right;Anterior    Pain Descriptors / Indicators  Sore;Aching    Pain Type  Chronic pain    Pain Onset  More than a month ago    Multiple Pain Sites  No  [facility] Adult PT Treatment/Exercise - 06/14/19 0001      Self-Care   Self-Care  Other Self-Care Comments    Other Self-Care Comments   Discussed comprehensive HEP (see pt. education) to check for appropriateness and need for update      Knee/Hip Exercises: Stretches   ITB Stretch  Right;Left;30 seconds;1 rep    ITB Stretch Limitations  Manual with therpais     Piriformis Stretch  Right;Left;30 seconds;2 reps    Piriformis Stretch Limitations  supine KTOS - more sensitive on L      Knee/Hip Exercises: Aerobic   Nustep  L4 x 6 min (UE/LE)      Knee/Hip Exercises: Supine   Bridges  Both;10 reps    Bridges Limitations  + hip ABD isometric with red TB    cues in mid back x 1 with break    Other Supine Knee/Hip Exercises  Alt hip ABD/ER with red TB 12 x 3"    Other Supine Knee/Hip  Exercises  B brace marching x 10 each      Knee/Hip Exercises: Sidelying   Clams  B clam with yellow TB x 12 x 2" hold             PT Education - 06/14/19 1822    Education Details  HEP comprehensive Review to check for need for update;  Instructed pt. to progress to using yellow TB around knees for 3-way standing hip kicker when ready (pt. is currently not due to hip pain), Encouraged pt. to progress repetitions and or hold times to intenify clam shell and bridge; pt. verbalized understanding of HEP instruction in preparation for transition to home program next session    Person(s) Educated  Patient    Methods  Explanation;Verbal cues;Handout    Comprehension  Verbalized understanding;Verbal cues required       PT Short Term Goals - 06/04/19 1024      PT SHORT TERM GOAL #1   Title  Patient will be independent with initial HEP    Status  Achieved   06/01/19       PT Long Term Goals - 06/07/19 1103      PT LONG TERM GOAL #1   Title  Patient will be independent with ongoing/advanced HEP    Status  Partially Met    Target Date  06/21/19      PT LONG TERM GOAL #2   Title  Patient to report B hip pain reduction in frequency and intensity by >/= 50%    Status  Achieved   06/01/19 - pt. noting 50% improvement in B hip pain since starting therapy     PT LONG TERM GOAL #3   Title  Patient will demonstrate improved B LE strength to >/= 4/5 to 4+/5 for improved stability and activity tolerance    Status  Partially Met    Target Date  06/21/19      PT LONG TERM GOAL #4   Title  Patient will report ability to initiate walking program for exercise at least 3x/week    Status  On-going    Target Date  06/21/19            Plan - 06/14/19 1826    Clinical Impression Statement  Felicia Hall doing well today.  Reports she feels she'll be ready to transition to home program at upcoming visit.  Session focused on HEP review to check for appropriateness of activities and need for update.   Did not make major  changes to comprehensive HEP as pt. reporting she is still challenged by HEP.  Encouraged pt. to progress standing 3-way hip kicker to using yellow looped TB at knees when she feels ready for additional resistance.  Encouraged pt. to progress repetitions and hold times with sidelying clam shell and bridge as she feels these activities become easier for her at home.  Pt. verbalized understanding.  Pt. tolerated proximal hip strengthening well however still with intermittent complaint of lateral hip soreness thus performed therex to pt. tolerance today.  Will plan to perform final goal testing at upcoming visit in preparation for transition to home program.     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, Sjogren's syndrome, HTN, glaucoma    Rehab Potential  Good    PT Treatment/Interventions  ADLs/Self Care Home Management;Cryotherapy;Electrical Stimulation;Iontophoresis 62m/ml Dexamethasone;Moist Heat;Ultrasound;Gait training;Functional mobility training;Therapeutic activities;Therapeutic exercise;Neuromuscular re-education;Patient/family education;Manual techniques;Passive range of motion;Dry needling;Joint Manipulations    PT Next Visit Plan  expected transition to home program per pt. request at upcoming visit; already discussed 30-day hold    PT Home Exercise Plan  05/14/19 - KTOS glute/piriformis stretch, hooklying clam with yellow TB, 3-way standing SLR, heel raises, rolling pin self-STM; 06/01/19 - seated figure-4 stretch, yellow TB issued for 3-way hip kicker and pt instructed to perform when she is ready; 06/07/19 -  frog leg hip adductor stretch, seated yellow TB march, seated hip ADB/ADD, hooklying clam progressed to red TB    Consulted and Agree with Plan of Care  Patient       Patient will benefit from skilled therapeutic intervention in order  to improve the following deficits and impairments:  Abnormal gait, Decreased activity tolerance, Decreased balance, Decreased endurance, Decreased mobility, Decreased safety awareness, Decreased strength, Difficulty walking, Increased fascial restricitons, Increased muscle spasms, Impaired perceived functional ability, Impaired flexibility, Pain  Visit Diagnosis: Pain in right hip  Pain in left hip  Muscle weakness (generalized)  Difficulty in walking, not elsewhere classified     Problem List Patient Active Problem List   Diagnosis Date Noted  . 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  . Benign essential tremor 01/03/2017  . Irritable bowel syndrome with constipation 08/09/2016  . Neck pain, chronic 08/09/2016  . Spinal stenosis in cervical region 08/09/2016  . Generalized constriction of visual field 06/22/2016  . 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 Perennial allergic rhinitis 10/17/2015  . Chronic pain syndrome 04/30/2013  . Hyperglycemia 04/30/2013  . Low back pain with right-sided sciatica 04/30/2013  . 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 Family history of colonic polyps 04/30/2013  . S[place]'s syndrome ([place] 10/27/2012  . 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 Encounter for long-term (current) use of other medications   . Disorder of bone and cartilage, unspecified   . Allergic rhinitis due to pollen   . Cervical spondylosis without myelopathy   . 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 Benign essential hypertension   . Mixed hyperlipidemia     MBess Harvest PDelaware03/04/21 6:28 PM   [facility] [address] [place] [address]Phone: [phone]  Fax:  [phone] Name: BDELBERT VUMRN: [id number] of Birth: 616-Mar-1947

## 2019-06-19 DIAGNOSIS — M5134 Other intervertebral disc degeneration, thoracic region: Secondary | ICD-10-CM | POA: Diagnosis not present

## 2019-06-19 DIAGNOSIS — M9902 Segmental and somatic dysfunction of thoracic region: Secondary | ICD-10-CM | POA: Diagnosis not present

## 2019-06-19 DIAGNOSIS — M9903 Segmental and somatic dysfunction of lumbar region: Secondary | ICD-10-CM | POA: Diagnosis not present

## 2019-06-19 DIAGNOSIS — M5136 Other intervertebral disc degeneration, lumbar region: Secondary | ICD-10-CM | POA: Diagnosis not present

## 2019-06-21 ENCOUNTER — Encounter: Payer: Self-pay | Admitting: Physical Therapy

## 2019-06-21 ENCOUNTER — Ambulatory Visit: Payer: Medicare Other | Admitting: Physical Therapy

## 2019-06-21 ENCOUNTER — Other Ambulatory Visit: Payer: Self-pay

## 2019-06-21 DIAGNOSIS — M25552 Pain in left hip: Secondary | ICD-10-CM | POA: Diagnosis not present

## 2019-06-21 DIAGNOSIS — M6281 Muscle weakness (generalized): Secondary | ICD-10-CM | POA: Diagnosis not present

## 2019-06-21 DIAGNOSIS — R262 Difficulty in walking, not elsewhere classified: Secondary | ICD-10-CM

## 2019-06-21 DIAGNOSIS — M25551 Pain in right hip: Secondary | ICD-10-CM | POA: Diagnosis not present

## 2019-06-21 NOTE — Therapy (Addendum)
[facility] [place] [address]  [address] [place], [place], [address] Phone: [phone]   Fax:  [phone]  Physical Therapy Treatment / Discharge Summary  Patient Details  Name: Felicia Hall Name MRN: [id number] Date of Birth: 08/18/1945 Referring Provider (PT): Christopher Y Blackman, MD   Encounter Date: 06/21/2019  PT End of Session - 06/21/19 1024    Visit Number  12    Number of Visits  12    Date for PT Re-Evaluation  06/21/19    Authorization Type  [employer] Medicare    Progress Note Due on Visit  12    PT Start Time  1015    PT Stop Time  1051    PT Time Calculation (min)  36 min    Activity Tolerance  Patient tolerated treatment well    Behavior During Therapy  WFL for tasks assessed/performed       Past Medical History:  Diagnosis Date  . Acute upper respiratory infections of unspecified site   . Allergic rhinitis due to pollen   . Benign essential hypertension   . Cervical spondylosis without myelopathy   . Cervicalgia   . Diaphragmatic hernia without mention of obstruction or gangrene   . Disorder of bone and cartilage, unspecified   . Diverticulosis of colon (without mention of hemorrhage)   . Encounter for long-term (current) use of other medications   . GERD (gastroesophageal reflux disease)   . History of hiatal hernia   . Hyperlipidemia LDL goal < 100   . Keratoconjunctivitis sicca, 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'Hall   . Other and unspecified hyperlipidemia   . Pain in joint, site unspecified   . Pre-diabetes   . Routine general medical examination at a health care facility   . Sjogren'Hall syndrome (HCC) 10/27/2012  . Tear film insufficiency, unspecified   . Unspecified disorder of kidney and ureter   . Unspecified glaucoma(365.9)     Past Surgical History:  Procedure Laterality Date  . BELPHAROPTOSIS REPAIR     brow lift   bil  . CATARACT EXTRACTION W/ INTRAOCULAR LENS  IMPLANT, BILATERAL Bilateral 12/2014   Dr.  Lyles  . CHOLECYSTECTOMY  1993   Dr.Lindsey   . DILATION AND CURETTAGE OF UTERUS    . LUMBAR LAMINECTOMY/DECOMPRESSION MICRODISCECTOMY N/A 04/21/2017   Procedure: BILATERAL LUMBAR FOUR- LUMBAR FIVE AND LEFT LUMBAR FIVE- SACRAL ONE LAMINOTOMY, MEDIAL FACETECTOMY AND FORAMINOTOMIES;  Surgeon: Nudelman, Robert, MD;  Location: [facility];  Service: Neurosurgery;  Laterality: N/A;  . NASAL SEPTUM SURGERY      There were no vitals filed for this visit.  Subjective Assessment - 06/21/19 1023    Subjective  Pt. denies pain today.  Does anticipate finishing up with therapy today.    Diagnostic tests  04/17/19 - B hip x-ray negative: There is no evidence of hip fracture or dislocation. There is no evidence of arthropathy or other focal bone abnormality.    Patient Stated Goals  "to get rid of the pain so I can walk more"    Currently in Pain?  No/denies    Pain Score  0-No pain    Pain Location  --    Pain Orientation  --    Multiple Pain Sites  No         [facility] PT Assessment - 06/21/19 1015      Assessment   Medical Diagnosis  B hip trochanteric bursitis    Referring Provider (PT)  Christopher Y Blackman, MD      Onset Date/Surgical Date  --   chronic - 1990s   Next MD Visit  none scheduled      Observation/Other Assessments   Focus on Therapeutic Outcomes (FOTO)   Hip - 67% (33% limitation)      Strength   Right Hip Flexion  4/5   mild pain   Right Hip Extension  4/5    Right Hip External Rotation   4/5   mild pain - groin   Right Hip Internal Rotation  4/5   mild pain - posterior hip   Right Hip ABduction  4+/5    Right Hip ADduction  4+/5    Left Hip Flexion  4/5   mild pain   Left Hip Extension  4/5    Left Hip External Rotation  4/5   mild pain - groin   Left Hip Internal Rotation  4/5   mild pain - posterior hip   Left Hip ABduction  4+/5    Left Hip ADduction  4+/5    Right Knee Flexion  4+/5    Right Knee Extension  4+/5    Left Knee Flexion  4+/5    Left Knee Extension   4+/5    Right Ankle Dorsiflexion  4+/5    Right Ankle Plantar Flexion  4/5    Left Ankle Dorsiflexion  4+/5    Left Ankle Plantar Flexion  4/5                   [facility] Adult PT Treatment/Exercise - 06/21/19 1015      Knee/Hip Exercises: Aerobic   Nustep  L4 x 7 min (UE/LE)      Manual Therapy   Manual Therapy  Taping    Kinesiotex  Create Space      Kinesiotix   Create Space  30-50% star over B greater trochanter               PT Short Term Goals - 06/04/19 1024      PT SHORT TERM GOAL #1   Title  Patient will be independent with initial HEP    Status  Achieved   06/01/19       PT Long Term Goals - 06/21/19 1026      PT LONG TERM GOAL #1   Title  Patient will be independent with ongoing/advanced HEP    Status  Achieved   06/21/19     PT LONG TERM GOAL #2   Title  Patient to report B hip pain reduction in frequency and intensity by >/= 50%    Status  Achieved   06/01/19     PT LONG TERM GOAL #3   Title  Patient will demonstrate improved B LE strength to >/= 4/5 to 4+/5 for improved stability and activity tolerance    Status  Achieved   06/21/19     PT LONG TERM GOAL #4   Title  Patient will report ability to initiate walking program for exercise at least 3x/week    Status  Unable to assess   06/21/19 - not attempted due pt concerns about sun exposure but she states she does not think her hips/legs would prevent her from walking           Plan - 06/21/19 1051    Clinical Impression Statement  Iveliz is pleased with her progress with PT noting improved LE strength and increased walking tolerance when shopping with decreased B hip pain. She denies pain at rest or with   normal activity but does note some discomfort with resistance for MMT. B LE strength significantly improved with overall MMT now 4/5 to 4+/5. Patient feels confident with current HEP for continued strengthening and denies need for further review. She has not initiated a home walking  program as she intended but states this is not due to pain but rather wanting to limit sun exposure and does not have somewhere she feels comfortable walking indoors - she states she plans to try to walk some on cloudy days. All goals met other than unable to assess walking program as above and patient wanting to proceed with transition to HEP at this time but would like to remain on hold for 30 days in the event that issues arise with HEP or as she initiates the walking program.    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, Sjogren'Hall syndrome, HTN, glaucoma    Rehab Potential  Good    PT Treatment/Interventions  ADLs/Self Care Home Management;Cryotherapy;Electrical Stimulation;Iontophoresis 4mg/ml Dexamethasone;Moist Heat;Ultrasound;Gait training;Functional mobility training;Therapeutic activities;Therapeutic exercise;Neuromuscular re-education;Patient/family education;Manual techniques;Passive range of motion;Dry needling;Joint Manipulations    PT Next Visit Plan  30-day hold    PT Home Exercise Plan  05/14/19 - KTOS glute/piriformis stretch, hooklying clam with yellow TB, 3-way standing SLR, heel raises, rolling pin self-STM; 06/01/19 - seated figure-4 stretch, yellow TB issued for 3-way hip kicker and pt instructed to perform when she is ready; 06/07/19 -  frog leg hip adductor stretch, seated yellow TB march, seated hip ADB/ADD, hooklying clam progressed to red TB    Consulted and Agree with Plan of Care  Patient       Patient will benefit from skilled therapeutic intervention in order to improve the following deficits and impairments:  Abnormal gait, Decreased activity tolerance, Decreased balance, Decreased endurance, Decreased mobility, Decreased safety awareness, Decreased strength, Difficulty walking, Increased fascial restricitons, Increased muscle spasms, Impaired  perceived functional ability, Impaired flexibility, Pain  Visit Diagnosis: Pain in right hip  Pain in left hip  Muscle weakness (generalized)  Difficulty in walking, not elsewhere classified     Problem List Patient Active Problem List   Diagnosis Date Noted  . 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  . Benign essential tremor 01/03/2017  . Irritable bowel syndrome with constipation 08/09/2016  . Neck pain, chronic 08/09/2016  . Spinal stenosis in cervical region 08/09/2016  . Generalized constriction of visual field 06/22/2016  . 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 Perennial allergic rhinitis 10/17/2015  . Chronic pain syndrome 04/30/2013  . Hyperglycemia 04/30/2013  . Low back pain with right-sided sciatica 04/30/2013  . 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 Family history of colonic polyps 04/30/2013  . Sjogren'Hall syndrome (HCC) 10/27/2012  . Keratoconjunctivitis sicca, 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'Hall   . Encounter for long-term (current) use of other medications   . Disorder of bone and cartilage, unspecified   . Allergic rhinitis due to pollen   . Cervical spondylosis without myelopathy   . 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 Benign essential hypertension   . Mixed hyperlipidemia     JoAnne M Kreis, PT, MPT 06/21/2019, 12:34 PM  [facility] [place] [address]  [address] [place], [place], [address] Phone: [phone]   Fax:  [phone]  Name: Bernetha Hall Duprey MRN: [id number] Date of Birth: 11/08/1945  PHYSICAL THERAPY   DISCHARGE SUMMARY  Visits from Start of Care: 12  Current functional level related to goals / functional outcomes:   Refer to above clinical impression for status as of last visit on 06/21/2019. Patient was placed on hold for 30 days and has not needed to return to PT, therefore will proceed with  discharge from PT for this episode.   Remaining deficits:   As above.   Education / Equipment:   HEP  Plan: Patient agrees to discharge.  Patient goals were mostly met. Patient is being discharged due to being pleased with the current functional level.  ?????     JoAnne M. Kreis, PT, MPT 08/22/19, 8:43 AM  [facility] [place] [address]  [address] [place], [place], [address] Phone: [phone]   Fax:  [phone]    

## 2019-07-03 DIAGNOSIS — M9902 Segmental and somatic dysfunction of thoracic region: Secondary | ICD-10-CM | POA: Diagnosis not present

## 2019-07-03 DIAGNOSIS — M5134 Other intervertebral disc degeneration, thoracic region: Secondary | ICD-10-CM | POA: Diagnosis not present

## 2019-07-03 DIAGNOSIS — M5136 Other intervertebral disc degeneration, lumbar region: Secondary | ICD-10-CM | POA: Diagnosis not present

## 2019-07-03 DIAGNOSIS — M9903 Segmental and somatic dysfunction of lumbar region: Secondary | ICD-10-CM | POA: Diagnosis not present

## 2019-07-04 ENCOUNTER — Ambulatory Visit: Payer: Medicare Other | Attending: Internal Medicine

## 2019-07-04 DIAGNOSIS — Z23 Encounter for immunization: Secondary | ICD-10-CM

## 2019-07-04 NOTE — Progress Notes (Signed)
   Covid-19 Vaccination Clinic  Name:  Felicia Hall    MRN: [id number] DOB: 15-May-1945  07/04/2019  Ms. Cake was observed post Covid-19 immunization for 15 minutes without incident. She was provided with Vaccine Information Sheet and instruction to access the V-Safe system.   [name] was instructed to call 911 with any severe reactions post vaccine: Marland Kitchen Difficulty breathing  . Swelling of face and throat  . A fast heartbeat  . A bad rash all over body  . Dizziness and weakness   Immunizations Administered    Name Date Dose VIS Date Route   Pfizer COVID-19 Vaccine 07/04/2019  4:05 PM 0.3 mL 03/23/2019 Intramuscular   Manufacturer: [place]   Lot: [id number]   [place]: [id number]

## 2019-07-17 DIAGNOSIS — M5136 Other intervertebral disc degeneration, lumbar region: Secondary | ICD-10-CM | POA: Diagnosis not present

## 2019-07-17 DIAGNOSIS — M9903 Segmental and somatic dysfunction of lumbar region: Secondary | ICD-10-CM | POA: Diagnosis not present

## 2019-07-17 DIAGNOSIS — M9902 Segmental and somatic dysfunction of thoracic region: Secondary | ICD-10-CM | POA: Diagnosis not present

## 2019-07-17 DIAGNOSIS — M5134 Other intervertebral disc degeneration, thoracic region: Secondary | ICD-10-CM | POA: Diagnosis not present

## 2019-07-23 DIAGNOSIS — I8310 Varicose veins of unspecified lower extremity with inflammation: Secondary | ICD-10-CM | POA: Diagnosis not present

## 2019-07-23 DIAGNOSIS — L720 Epidermal cyst: Secondary | ICD-10-CM | POA: Diagnosis not present

## 2019-07-31 DIAGNOSIS — M5136 Other intervertebral disc degeneration, lumbar region: Secondary | ICD-10-CM | POA: Diagnosis not present

## 2019-07-31 DIAGNOSIS — M9903 Segmental and somatic dysfunction of lumbar region: Secondary | ICD-10-CM | POA: Diagnosis not present

## 2019-07-31 DIAGNOSIS — M5134 Other intervertebral disc degeneration, thoracic region: Secondary | ICD-10-CM | POA: Diagnosis not present

## 2019-07-31 DIAGNOSIS — M9902 Segmental and somatic dysfunction of thoracic region: Secondary | ICD-10-CM | POA: Diagnosis not present

## 2019-08-08 DIAGNOSIS — L218 Other seborrheic dermatitis: Secondary | ICD-10-CM | POA: Diagnosis not present

## 2019-08-08 DIAGNOSIS — L720 Epidermal cyst: Secondary | ICD-10-CM | POA: Diagnosis not present

## 2019-08-21 DIAGNOSIS — Z1231 Encounter for screening mammogram for malignant neoplasm of breast: Secondary | ICD-10-CM | POA: Diagnosis not present

## 2019-08-21 LAB — HM MAMMOGRAPHY

## 2019-08-28 DIAGNOSIS — M9903 Segmental and somatic dysfunction of lumbar region: Secondary | ICD-10-CM | POA: Diagnosis not present

## 2019-08-28 DIAGNOSIS — M9901 Segmental and somatic dysfunction of cervical region: Secondary | ICD-10-CM | POA: Diagnosis not present

## 2019-08-28 DIAGNOSIS — M9905 Segmental and somatic dysfunction of pelvic region: Secondary | ICD-10-CM | POA: Diagnosis not present

## 2019-08-28 DIAGNOSIS — M5136 Other intervertebral disc degeneration, lumbar region: Secondary | ICD-10-CM | POA: Diagnosis not present

## 2019-08-31 ENCOUNTER — Encounter: Payer: Self-pay | Admitting: Internal Medicine

## 2019-09-11 DIAGNOSIS — M9903 Segmental and somatic dysfunction of lumbar region: Secondary | ICD-10-CM | POA: Diagnosis not present

## 2019-09-11 DIAGNOSIS — M5136 Other intervertebral disc degeneration, lumbar region: Secondary | ICD-10-CM | POA: Diagnosis not present

## 2019-09-11 DIAGNOSIS — M9901 Segmental and somatic dysfunction of cervical region: Secondary | ICD-10-CM | POA: Diagnosis not present

## 2019-09-11 DIAGNOSIS — M9905 Segmental and somatic dysfunction of pelvic region: Secondary | ICD-10-CM | POA: Diagnosis not present

## 2019-09-13 DIAGNOSIS — H401132 Primary open-angle glaucoma, bilateral, moderate stage: Secondary | ICD-10-CM | POA: Diagnosis not present

## 2019-09-20 ENCOUNTER — Other Ambulatory Visit: Payer: Self-pay | Admitting: Internal Medicine

## 2019-09-20 NOTE — Telephone Encounter (Signed)
rx sent to pharmacy by e-script  

## 2019-09-25 ENCOUNTER — Encounter: Payer: Medicare Other | Admitting: Family

## 2019-09-25 DIAGNOSIS — M9903 Segmental and somatic dysfunction of lumbar region: Secondary | ICD-10-CM | POA: Diagnosis not present

## 2019-09-25 DIAGNOSIS — M5136 Other intervertebral disc degeneration, lumbar region: Secondary | ICD-10-CM | POA: Diagnosis not present

## 2019-09-25 DIAGNOSIS — M9905 Segmental and somatic dysfunction of pelvic region: Secondary | ICD-10-CM | POA: Diagnosis not present

## 2019-09-25 DIAGNOSIS — M9901 Segmental and somatic dysfunction of cervical region: Secondary | ICD-10-CM | POA: Diagnosis not present

## 2019-09-27 ENCOUNTER — Other Ambulatory Visit: Payer: Self-pay

## 2019-09-27 ENCOUNTER — Other Ambulatory Visit: Payer: Medicare Other

## 2019-09-27 DIAGNOSIS — R739 Hyperglycemia, unspecified: Secondary | ICD-10-CM

## 2019-09-27 DIAGNOSIS — E782 Mixed hyperlipidemia: Secondary | ICD-10-CM

## 2019-09-27 DIAGNOSIS — I10 Essential (primary) hypertension: Secondary | ICD-10-CM

## 2019-09-28 LAB — CBC WITH DIFFERENTIAL/PLATELET
Absolute Monocytes: 592 cells/uL (ref 200–950)
Basophils Absolute: 118 cells/uL (ref 0–200)
Basophils Relative: 1.6 %
Eosinophils Absolute: 422 cells/uL (ref 15–500)
Eosinophils Relative: 5.7 %
HCT: 43.8 % (ref 35.0–45.0)
Hemoglobin: 14.8 g/dL (ref 11.7–15.5)
Lymphs Abs: 2139 cells/uL (ref 850–3900)
MCH: 30.1 pg (ref 27.0–33.0)
MCHC: 33.8 g/dL (ref 32.0–36.0)
MCV: 89.2 fL (ref 80.0–100.0)
MPV: 10.5 fL (ref 7.5–12.5)
Monocytes Relative: 8 %
Neutro Abs: 4129 cells/uL (ref 1500–7800)
Neutrophils Relative %: 55.8 %
Platelets: 301 10*3/uL (ref 140–400)
RBC: 4.91 10*6/uL (ref 3.80–5.10)
RDW: 12.1 % (ref 11.0–15.0)
Total Lymphocyte: 28.9 %
WBC: 7.4 10*3/uL (ref 3.8–10.8)

## 2019-09-28 LAB — COMPLETE METABOLIC PANEL WITH GFR
AG Ratio: 1.9 (calc) (ref 1.0–2.5)
ALT: 21 U/L (ref 6–29)
AST: 19 U/L (ref 10–35)
Albumin: 4.5 g/dL (ref 3.6–5.1)
Alkaline phosphatase (APISO): 42 U/L (ref 37–153)
BUN: 14 mg/dL (ref 7–25)
CO2: 25 mmol/L (ref 20–32)
Calcium: 9.7 mg/dL (ref 8.6–10.4)
Chloride: 104 mmol/L (ref 98–110)
Creat: 0.92 mg/dL (ref 0.60–0.93)
GFR, Est African American: 71 mL/min/{1.73_m2} (ref >=60)
GFR, Est Non African American: 61 mL/min/{1.73_m2} (ref >=60)
Globulin: 2.4 g/dL (calc) (ref 1.9–3.7)
Glucose, Bld: 126 mg/dL — ABNORMAL HIGH (ref 65–99)
Potassium: 4.2 mmol/L (ref 3.5–5.3)
Sodium: 137 mmol/L (ref 135–146)
Total Bilirubin: 0.6 mg/dL (ref 0.2–1.2)
Total Protein: 6.9 g/dL (ref 6.1–8.1)

## 2019-09-28 LAB — HEMOGLOBIN A1C
Hgb A1c MFr Bld: 6.5 % of total Hgb — ABNORMAL HIGH (ref <5.7)
Mean Plasma Glucose: 140 (calc)
eAG (mmol/L): 7.7 (calc)

## 2019-09-28 LAB — LIPID PANEL
Cholesterol: 167 mg/dL (ref <200)
HDL: 37 mg/dL — ABNORMAL LOW (ref >=50)
LDL Cholesterol (Calc): 107 mg/dL (calc) — ABNORMAL HIGH
Non-HDL Cholesterol (Calc): 130 mg/dL (calc) — ABNORMAL HIGH (ref <130)
Total CHOL/HDL Ratio: 4.5 (calc) (ref <5.0)
Triglycerides: 115 mg/dL (ref <150)

## 2019-09-28 NOTE — Progress Notes (Signed)
Bad cholesterol is about the same Triglycerides (starchy, sweet fats) have improved but were ok last time, too Blood counts are normal Kidneys, liver and electrolytes are all ok Sugar average has trended up into diabetic range, unfortunately.  We'll address this at her visit.

## 2019-10-01 ENCOUNTER — Encounter: Payer: Self-pay | Admitting: Internal Medicine

## 2019-10-01 ENCOUNTER — Other Ambulatory Visit: Payer: Self-pay

## 2019-10-01 ENCOUNTER — Ambulatory Visit (INDEPENDENT_AMBULATORY_CARE_PROVIDER_SITE_OTHER): Payer: Medicare Other | Admitting: Internal Medicine

## 2019-10-01 VITALS — BP 122/78 | HR 78 | Temp 97.5°F | Ht 67.0 in | Wt 174.0 lb

## 2019-10-01 DIAGNOSIS — Z0000 Encounter for general adult medical examination without abnormal findings: Secondary | ICD-10-CM

## 2019-10-01 DIAGNOSIS — M7060 Trochanteric bursitis, unspecified hip: Secondary | ICD-10-CM | POA: Diagnosis not present

## 2019-10-01 DIAGNOSIS — M159 Polyosteoarthritis, unspecified: Secondary | ICD-10-CM

## 2019-10-01 DIAGNOSIS — R42 Dizziness and giddiness: Secondary | ICD-10-CM | POA: Diagnosis not present

## 2019-10-01 DIAGNOSIS — I440 Atrioventricular block, first degree: Secondary | ICD-10-CM

## 2019-10-01 DIAGNOSIS — E119 Type 2 diabetes mellitus without complications: Secondary | ICD-10-CM

## 2019-10-01 DIAGNOSIS — R002 Palpitations: Secondary | ICD-10-CM | POA: Diagnosis not present

## 2019-10-01 HISTORY — DX: Type 2 diabetes mellitus without complications: E11.9

## 2019-10-01 MED ORDER — ASPIRIN EC 81 MG PO TBEC: 81.0000 mg | DELAYED_RELEASE_TABLET | Freq: Every day | ORAL | 11 refills | Status: AC

## 2019-10-01 NOTE — Progress Notes (Signed)
Provider:  Rexene Edison. Mariea Clonts, D.O., C.M.D. Location:   [facility]  Place of Service:   clinic  Previous PCP: Gayland Curry, DO Patient Care Team: Gayland Curry, DO as PCP - General (Geriatric Medicine)  Extended Emergency Contact Information Primary Emergency Contact: Tash,Thomas Address: [place], [place] Phone: [phone] Relation: Brother  Code Status: DNR Goals of Care: Advanced Directive information Advanced Directives 10/01/2019  Does Patient Have a Medical Advance Directive? Yes  Type of Advance Directive [place]  Does patient want to make changes to medical advance directive? No - Patient declined  Copy of [place] in Chart? -  Would patient like information on creating a medical advance directive? -   Chief Complaint  Patient presents with  . Annual Exam    CPE    HPI: Patient is a 74 y.o. female seen today for an annual physical exam.  She also has an acute concern of palpitations (below)  She had hip bursitis per dr. Ninfa Linden.  She has been to several weeks of PT.  She's been doing exercises at home.  Has days where she can walk without pain, but others hurts.  Hips were actually in good shape and no hip replacement indicated like she was concerned about.  Had covid vaccines in March.  Palpitations:  Happened last month sometime.  She's not certain when.  Had a few episodes of her heart beating fast.  Also had a spell of being dizzy walking to the living room, sat down fast and put her head back and it resolved.  Never happened again.    No other unusual or new things.  She is a little stressed b/c her old sick cat died last week.  She's down about that.    Past Medical History:  Diagnosis Date  . Acute upper respiratory infections of unspecified site   . Allergic rhinitis due to pollen   . Benign essential hypertension   . Cervical spondylosis without myelopathy     . Cervicalgia   . Diaphragmatic hernia without mention of obstruction or gangrene   . Disorder of bone and cartilage, unspecified   . Diverticulosis of colon (without mention of hemorrhage)   . Encounter for long-term (current) use of other medications   . GERD (gastroesophageal reflux disease)   . History of hiatal hernia   . Hyperlipidemia LDL goal < 100   . 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 Other and unspecified hyperlipidemia   . Pain in joint, site unspecified   . Pre-diabetes   . Routine general medical examination at a health care facility   . Sjogren's syndrome ([place]) 10/27/2012  . Tear film insufficiency, unspecified   . Unspecified disorder of kidney and ureter   . Unspecified glaucoma(365.9)    Past Surgical History:  Procedure Laterality Date  . BELPHAROPTOSIS REPAIR     brow lift   bil  . CATARACT EXTRACTION W/ INTRAOCULAR LENS  IMPLANT, BILATERAL Bilateral 12/2014   Dr. Prudencio Burly  . [place]   . DILATION AND CURETTAGE OF UTERUS    . LUMBAR LAMINECTOMY/DECOMPRESSION MICRODISCECTOMY N/A 04/21/2017   Procedure: BILATERAL LUMBAR FOUR- LUMBAR FIVE AND LEFT LUMBAR FIVE- SACRAL ONE LAMINOTOMY, MEDIAL FACETECTOMY AND FORAMINOTOMIES;  Surgeon: Jovita Gamma, MD;  Location: [facility];  Service: Neurosurgery;  Laterality: N/A;  . NASAL SEPTUM SURGERY  reports that she has never smoked. She has never used smokeless tobacco. She reports current alcohol use. She reports that she does not use drugs.  Functional Status Survey:    Family History  Problem Relation Age of Onset  . Heart attack Mother   . Breast cancer Mother   . Pancreatitis Father   . Heart disease Brother        By-pass surgery  . Lupus Cousin     Health Maintenance  Topic Date Due  . TETANUS/TDAP  04/13/2015  . INFLUENZA VACCINE  11/11/2019  . Fecal DNA (Cologuard)  02/29/2020  . MAMMOGRAM  08/20/2021  . DEXA SCAN  Completed  . COVID-19 Vaccine   Completed  . Hepatitis C Screening  Completed  . PNA vac Low Risk Adult  Completed    Allergies  Allergen Reactions  . Statins Other (See Comments)    Muscle problems  . Allopurinol Rash  . Darvocet [Propoxyphene N-Acetaminophen] Rash    Outpatient Encounter Medications as of 10/01/2019  Medication Sig  . brinzolamide (AZOPT) 1 % ophthalmic suspension Place 1 drop into both eyes 2 (two) times daily.  . calcium citrate-vitamin D (CITRACAL+D) 315-200 MG-UNIT per tablet Take 1 tablet by mouth 4 (four) times daily.  . cholecalciferol (VITAMIN D) 1000 UNITS tablet Take 2,000 Units by mouth daily.   . fenofibrate (TRICOR) 145 MG tablet TAKE 1 TABLET BY MOUTH EVERY DAY  . losartan (COZAAR) 50 MG tablet TAKE 1 TABLET BY MOUTH ONCE DAILY FOR BLOOD PRESSURE  . omeprazole (PRILOSEC) 20 MG capsule Take 20 mg by mouth daily.   . [DISCONTINUED] diclofenac Sodium (VOLTAREN) 1 % GEL Apply 2 g topically 4 (four) times daily.   No facility-administered encounter medications on file as of 10/01/2019.    Review of Systems  Constitutional: Negative for chills and fever.  HENT: Negative for congestion, hearing loss and sore throat.   Eyes:       Stable visit with Dr. Prudencio Burly   Respiratory: Negative for cough and shortness of breath.   Cardiovascular: Positive for palpitations. Negative for chest pain, orthopnea, leg swelling and PND.  Gastrointestinal: Positive for constipation. Negative for abdominal pain, blood in stool, diarrhea, melena, nausea and vomiting.  Genitourinary: Positive for frequency. Negative for dysuria.       Unchanged   Musculoskeletal: Positive for back pain and joint pain. Negative for falls.       Hip pain improved with therapy  Neurological: Positive for dizziness. Negative for sensory change, loss of consciousness and weakness.  Endo/Heme/Allergies: Does not bruise/bleed easily.  Psychiatric/Behavioral: Negative for depression and memory loss. The patient is not nervous/anxious  and does not have insomnia.     Vitals:   10/01/19 1013  BP: 122/78  Pulse: 78  Temp: (!) 97.5 F (36.4 C)  SpO2: 95%  Weight: 174 lb (78.9 kg)  Height: 5\' 7"  (1.702 m)   Body mass index is 27.25 kg/m. Physical Exam Constitutional:      General: She is not in acute distress.    Appearance: Normal appearance. She is not toxic-appearing.  HENT:     Head: Normocephalic and atraumatic.     Right Ear: Tympanic membrane and ear canal normal.     Left Ear: Tympanic membrane and ear canal normal.     Ears:     Comments:  dry cerumen in each ear     Nose:     Comments: Deferred nose and mouth due to covid masking and no concerns  in those areas--chronic dry mouth, rhinorrhea Eyes:     Extraocular Movements: Extraocular movements intact.     Conjunctiva/sclera: Conjunctivae normal.     Pupils: Pupils are equal, round, and reactive to light.  Cardiovascular:     Rate and Rhythm: Normal rate and regular rhythm.     Pulses: Normal pulses.     Heart sounds: Normal heart sounds.  Pulmonary:     Effort: Pulmonary effort is normal.     Breath sounds: Normal breath sounds. No wheezing, rhonchi or rales.  Abdominal:     General: Bowel sounds are normal. There is no distension.     Palpations: Abdomen is soft.     Tenderness: There is no abdominal tenderness. There is no guarding or rebound.  Musculoskeletal:        General: Normal range of motion.     Cervical back: Neck supple.     Right lower leg: No edema.     Left lower leg: No edema.     Comments: No pain reported now with joint rom of hips and knees  Lymphadenopathy:     Cervical: No cervical adenopathy.  Skin:    General: Skin is warm and dry.     Coloration: Skin is pale.  Neurological:     General: No focal deficit present.     Mental Status: She is alert and oriented to person, place, and time.     Cranial Nerves: No cranial nerve deficit.     Sensory: No sensory deficit.     Motor: No weakness.     Coordination:  Coordination normal.     Gait: Gait normal.     Deep Tendon Reflexes: Reflexes normal.  Psychiatric:        Mood and Affect: Mood normal.        Behavior: Behavior normal.        Thought Content: Thought content normal.        Judgment: Judgment normal.     Comments: Flat affect     Labs reviewed: Basic Metabolic Panel: Recent Labs    03/20/19 0815 09/27/19 0825  NA 138 137  K 4.0 4.2  CL 103 104  CO2 26 25  GLUCOSE 124* 126*  BUN 15 14  CREATININE 0.99* 0.92  CALCIUM 10.1 9.7   Liver Function Tests: Recent Labs    09/27/19 0825  AST 19  ALT 21  BILITOT 0.6  PROT 6.9   No results for input(s): LIPASE, AMYLASE in the last 8760 hours. No results for input(s): AMMONIA in the last 8760 hours. CBC: Recent Labs    09/27/19 0825  WBC 7.4  NEUTROABS 4,129  HGB 14.8  HCT 43.8  MCV 89.2  PLT 301   Cardiac Enzymes: No results for input(s): CKTOTAL, CKMB, CKMBINDEX, TROPONINI in the last 8760 hours. BNP: Invalid input(s): POCBNP Lab Results  Component Value Date   HGBA1C 6.5 (H) 09/27/2019   No results found for: TSH No results found for: VITAMINB12 No results found for: FOLATE No results found for: IRON, TIBC, FERRITIN  Imaging and Procedures Recently: Normal mammogram in may reviewed  EKG today for palpitations and dizziness:  Previously in NSR last summer and now in 1st degree AV block at 75 which is a change--some flattening of anterior T waves also  Assessment/Plan 1. Annual physical exam -performed today  -had breast exam with gyn and normal mammogram -up to date on vaccines  2. Palpitations--acute concern - a few episodes last month then no  more, also had spell of dizziness separately - EKG 12-Lead is different than last time a year ago--will refer to cardiology--may need monitor -start baby aspirin in case she's been in afib and due to hba1c now in diabetic range and DMI being CAD equivalent  3. Dizziness - one episode while walking that  resolved with sitting and putting her head back - EKG 12-Lead  4. Generalized osteoarthritis of multiple sites -may use tylenol or topicals like voltaren gel, salonpas  5. Trochanteric bursitis, unspecified laterality -doing better since therapy--cont exercises at home  6. Controlled type 2 diabetes mellitus without complication, without long-term current use of insulin ([place]) -new diagnosis -cont arb, counseled on diet and exercise  -exercise limited by considerable low back pain and joint pains -f/u hba1c in 4 mos  Labs/tests ordered:   Lab Orders     Hemoglobin A1c     Lipid panel     BASIC METABOLIC PANEL WITH GFR  F/u in 4 mos for med mgt, fasting labs before  Issaiah Seabrooks L. Ezana Hubbert, D.O. [facility] [address], [place] [address] Cell Phone (Mon-Fri 8am-5pm):  [phone] On Call:  [phone] & follow prompts after 5pm & weekends Office Phone:  [phone] Office Fax:  [phone]

## 2019-10-01 NOTE — Patient Instructions (Addendum)
I recommend you get some debrox drops to put in your ears to loosen up the dry wax--it will probably help you hear better if that works its way out.   Diabetes Basics  Diabetes (diabetes mellitus) is a long-term (chronic) disease. It occurs when the body does not properly use sugar (glucose) that is released from food after you eat. Diabetes may be caused by one or both of these problems:  Your pancreas does not make enough of a hormone called insulin.  Your body does not react in a normal way to insulin that it makes. Insulin lets sugars (glucose) go into cells in your body. This gives you energy. If you have diabetes, sugars cannot get into cells. This causes high blood sugar (hyperglycemia). Follow these instructions at home: How is diabetes treated? You may need to take insulin or other diabetes medicines daily to keep your blood sugar in balance if diet and exercise cannot keep your sugar under control.  How do I manage my blood sugar?  Check your blood sugar levels using a blood glucose monitor as directed by your doctor. Your doctor will set treatment goals for you. Generally, you should have these blood sugar levels:  Before meals (preprandial): 80-130 mg/dL (4.4-7.2 mmol/L).  After meals (postprandial): below 180 mg/dL (10 mmol/L).  A1c level: less than 7%.  What do I need to know about low blood sugar? Low blood sugar is called hypoglycemia. This is when blood sugar is at or below 70 mg/dL (3.9 mmol/L). Symptoms may include:  Feeling: ? Hungry. ? Worried or nervous (anxious). ? Sweaty and clammy. ? Confused. ? Dizzy. ? Sleepy. ? Sick to your stomach (nauseous).  Having: ? A fast heartbeat. ? A headache. ? A change in your vision. ? Tingling or no feeling (numbness) around the mouth, lips, or tongue. ? Jerky movements that you cannot control (seizure).  Having trouble with: ? Moving (coordination). ? Sleeping. ? Passing out (fainting). ? Getting upset easily  (irritability). Treating low blood sugar To treat low blood sugar, eat or drink something sugary right away. If you can think clearly and swallow safely, follow the 15:15 rule:  Take 15 grams of a fast-acting carb (carbohydrate). Talk with your doctor about how much you should take.  Some fast-acting carbs are: ? Sugar tablets (glucose pills). Take 3-4 glucose pills. ? 6-8 pieces of hard candy. ? 4-6 oz (120-150 mL) of fruit juice. ? 4-6 oz (120-150 mL) of regular (not diet) soda. ? 1 Tbsp (15 mL) honey or sugar.  Check your blood sugar 15 minutes after you take the carb.  If your blood sugar is still at or below 70 mg/dL (3.9 mmol/L), take 15 grams of a carb again.  If your blood sugar does not go above 70 mg/dL (3.9 mmol/L) after 3 tries, get help right away.  After your blood sugar goes back to normal, eat a meal or a snack within 1 hour. Treating very low blood sugar If your blood sugar is at or below 54 mg/dL (3 mmol/L), you have very low blood sugar (severe hypoglycemia). This is an emergency. Do not wait to see if the symptoms will go away. Get medical help right away. Call your local emergency services (911 in the [place] Do not drive yourself to the hospital. Questions to ask your health care provider  Do I need to meet with a diabetes educator?  What equipment will I need to care for myself at home?  What diabetes medicines do  I need? When should I take them?  How often do I need to check my blood sugar?  What number can I call if I have questions?  When is my next doctor's visit?  Where can I find a support group for people with diabetes? Where to find more information  American Diabetes Association: www.diabetes.org  American Association of Diabetes Educators: www.diabeteseducator.org/patient-resources Contact a doctor if:  Your blood sugar is at or above 240 mg/dL (13.3 mmol/L) for 2 days in a row.  You have been sick or have had a fever for 2 days or more,  and you are not getting better.  You have any of these problems for more than 6 hours: ? You cannot eat or drink. ? You feel sick to your stomach (nauseous). ? You throw up (vomit). ? You have watery poop (diarrhea). Get help right away if:  Your blood sugar is lower than 54 mg/dL (3 mmol/L).  You get confused.  You have trouble: ? Thinking clearly. ? Breathing. Summary  Diabetes (diabetes mellitus) is a long-term (chronic) disease. It occurs when the body does not properly use sugar (glucose) that is released from food after digestion.  Take insulin and diabetes medicines as told.  Check your blood sugar every day, as often as told.  Keep all follow-up visits as told by your doctor. This is important. This information is not intended to replace advice given to you by your health care provider. Make sure you discuss any questions you have with your health care provider. Document Revised: 12/20/2018 Document Reviewed: 07/01/2017 Elsevier Patient Education  [employer].   Diabetes Mellitus and Nutrition, Adult When you have diabetes (diabetes mellitus), it is very important to have healthy eating habits because your blood sugar (glucose) levels are greatly affected by what you eat and drink. Eating healthy foods in the appropriate amounts, at about the same times every day, can help you:  Control your blood glucose.  Lower your risk of heart disease.  Improve your blood pressure.  Reach or maintain a healthy weight. Every person with diabetes is different, and each person has different needs for a meal plan. Your health care provider may recommend that you work with a diet and nutrition specialist (dietitian) to make a meal plan that is best for you. Your meal plan may vary depending on factors such as:  The calories you need.  The medicines you take.  Your weight.  Your blood glucose, blood pressure, and cholesterol levels.  Your activity level.  Other health  conditions you have, such as heart or kidney disease. How do carbohydrates affect me? Carbohydrates, also called carbs, affect your blood glucose level more than any other type of food. Eating carbs naturally raises the amount of glucose in your blood. Carb counting is a method for keeping track of how many carbs you eat. Counting carbs is important to keep your blood glucose at a healthy level, especially if you use insulin or take certain oral diabetes medicines. It is important to know how many carbs you can safely have in each meal. This is different for every person. Your dietitian can help you calculate how many carbs you should have at each meal and for each snack. Foods that contain carbs include:  Bread, cereal, rice, pasta, and crackers.  Potatoes and corn.  Peas, beans, and lentils.  Milk and yogurt.  Fruit and juice.  Desserts, such as cakes, cookies, ice cream, and candy. How does alcohol affect me? Alcohol  can cause a sudden decrease in blood glucose (hypoglycemia), especially if you use insulin or take certain oral diabetes medicines. Hypoglycemia can be a life-threatening condition. Symptoms of hypoglycemia (sleepiness, dizziness, and confusion) are similar to symptoms of having too much alcohol. If your health care provider says that alcohol is safe for you, follow these guidelines:  Limit alcohol intake to no more than 1 drink per day for nonpregnant women and 2 drinks per day for men. One drink equals 12 oz of beer, 5 oz of wine, or 1 oz of hard liquor.  Do not drink on an empty stomach.  Keep yourself hydrated with water, diet soda, or unsweetened iced tea.  Keep in mind that regular soda, juice, and other mixers may contain a lot of sugar and must be counted as carbs. What are tips for following this plan?  Reading food labels  Start by checking the serving size on the "Nutrition Facts" label of packaged foods and drinks. The amount of calories, carbs, fats, and  other nutrients listed on the label is based on one serving of the item. Many items contain more than one serving per package.  Check the total grams (g) of carbs in one serving. You can calculate the number of servings of carbs in one serving by dividing the total carbs by 15. For example, if a food has 30 g of total carbs, it would be equal to 2 servings of carbs.  Check the number of grams (g) of saturated and trans fats in one serving. Choose foods that have low or no amount of these fats.  Check the number of milligrams (mg) of salt (sodium) in one serving. Most people should limit total sodium intake to less than 2,300 mg per day.  Always check the nutrition information of foods labeled as "low-fat" or "nonfat". These foods may be higher in added sugar or refined carbs and should be avoided.  Talk to your dietitian to identify your daily goals for nutrients listed on the label. Shopping  Avoid buying canned, premade, or processed foods. These foods tend to be high in fat, sodium, and added sugar.  Shop around the outside edge of the grocery store. This includes fresh fruits and vegetables, bulk grains, fresh meats, and fresh dairy. Cooking  Use low-heat cooking methods, such as baking, instead of high-heat cooking methods like deep frying.  Cook using healthy oils, such as olive, canola, or sunflower oil.  Avoid cooking with butter, cream, or high-fat meats. Meal planning  Eat meals and snacks regularly, preferably at the same times every day. Avoid going long periods of time without eating.  Eat foods high in fiber, such as fresh fruits, vegetables, beans, and whole grains. Talk to your dietitian about how many servings of carbs you can eat at each meal.  Eat 4-6 ounces (oz) of lean protein each day, such as lean meat, chicken, fish, eggs, or tofu. One oz of lean protein is equal to: ? 1 oz of meat, chicken, or fish. ? 1 egg. ?  cup of tofu.  Eat some foods each day that  contain healthy fats, such as avocado, nuts, seeds, and fish. Lifestyle  Check your blood glucose regularly.  Exercise regularly as told by your health care provider. This may include: ? 150 minutes of moderate-intensity or vigorous-intensity exercise each week. This could be brisk walking, biking, or water aerobics. ? Stretching and doing strength exercises, such as yoga or weightlifting, at least 2 times a week.  Take  medicines as told by your health care provider.  Do not use any products that contain nicotine or tobacco, such as cigarettes and e-cigarettes. If you need help quitting, ask your health care provider.  Work with a Social worker or diabetes educator to identify strategies to manage stress and any emotional and social challenges. Questions to ask a health care provider  Do I need to meet with a diabetes educator?  Do I need to meet with a dietitian?  What number can I call if I have questions?  When are the best times to check my blood glucose? Where to find more information:  American Diabetes Association: diabetes.org  Academy of Nutrition and Dietetics: www.eatright.[employer] of Diabetes and Digestive and Kidney Diseases (NIH): [employer] Summary  A healthy meal plan will help you control your blood glucose and maintain a healthy lifestyle.  Working with a diet and nutrition specialist (dietitian) can help you make a meal plan that is best for you.  Keep in mind that carbohydrates (carbs) and alcohol have immediate effects on your blood glucose levels. It is important to count carbs and to use alcohol carefully. This information is not intended to replace advice given to you by your health care provider. Make sure you discuss any questions you have with your health care provider. Document Revised: 03/11/2017 Document Reviewed: 05/03/2016 Elsevier Patient Education  2020 [employer].

## 2019-10-03 ENCOUNTER — Encounter: Payer: Medicare Other | Admitting: Family

## 2019-10-05 ENCOUNTER — Other Ambulatory Visit: Payer: Self-pay

## 2019-10-05 ENCOUNTER — Encounter: Payer: Self-pay | Admitting: Family

## 2019-10-05 ENCOUNTER — Telehealth: Payer: Self-pay

## 2019-10-05 ENCOUNTER — Ambulatory Visit (INDEPENDENT_AMBULATORY_CARE_PROVIDER_SITE_OTHER): Payer: Medicare Other | Admitting: Family

## 2019-10-05 DIAGNOSIS — Z0000 Encounter for general adult medical examination without abnormal findings: Secondary | ICD-10-CM

## 2019-10-05 NOTE — Progress Notes (Signed)
Subjective:   Felicia Hall is a 74 y.o. female who presents for Medicare Annual (Subsequent) preventive examination.  Review of Systems     Cardiac Risk Factors include: advanced age (>74men, >70 women);diabetes mellitus;hypertension     Objective:    Today's Vitals   10/05/19 1207  PainSc: 4    There is no height or weight on file to calculate BMI.  Advanced Directives 10/05/2019 10/01/2019 05/10/2019 09/20/2018 06/09/2018 05/31/2018 03/16/2018  Does Patient Have a Medical Advance Directive? Yes Yes Yes Yes Yes Yes Yes  Type of Advance Directive Living will;Healthcare Power of [place];Living will [place];Living will Healthcare Power of [place] of [place];Living will  Does patient want to make changes to medical advance directive? No - Patient declined No - Patient declined No - Patient declined No - Patient declined No - Patient declined No - Patient declined No - Patient declined  Copy of [place] in Chart? Yes - validated most recent copy scanned in chart (See row information) - Yes - validated most recent copy scanned in chart (See row information) - Yes - validated most recent copy scanned in chart (See row information) Yes - validated most recent copy scanned in chart (See row information) No - copy requested  Would patient like information on creating a medical advance directive? - - - - - - -    Current Medications (verified) Outpatient Encounter Medications as of 10/05/2019  Medication Sig  . aspirin EC 81 MG tablet Take 1 tablet (81 mg total) by mouth daily. Swallow whole.  . brinzolamide (AZOPT) 1 % ophthalmic suspension Place 1 drop into both eyes 2 (two) times daily.  . calcium citrate-vitamin D (CITRACAL+D) 315-200 MG-UNIT per tablet Take 1 tablet by mouth 4 (four) times daily.  . cholecalciferol (VITAMIN D) 1000 UNITS tablet  Take 2,000 Units by mouth daily.   . fenofibrate (TRICOR) 145 MG tablet TAKE 1 TABLET BY MOUTH EVERY DAY  . losartan (COZAAR) 50 MG tablet TAKE 1 TABLET BY MOUTH ONCE DAILY FOR BLOOD PRESSURE  . omeprazole (PRILOSEC) 20 MG capsule Take 20 mg by mouth daily.    No facility-administered encounter medications on file as of 10/05/2019.    Allergies (verified) Statins, Allopurinol, and Darvocet [propoxyphene n-acetaminophen]   History: Past Medical History:  Diagnosis Date  . Acute upper respiratory infections of unspecified site   . Allergic rhinitis due to pollen   . Benign essential hypertension   . Cervical spondylosis without myelopathy   . Cervicalgia   . Controlled type 2 diabetes mellitus without complication, without long-term current use of insulin ([place]) 10/01/2019  . Diaphragmatic hernia without mention of obstruction or gangrene   . Disorder of bone and cartilage, unspecified   . Diverticulosis of colon (without mention of hemorrhage)   . Encounter for long-term (current) use of other medications   . GERD (gastroesophageal reflux disease)   . History of hiatal hernia   . Hyperlipidemia LDL goal < 100   . 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 Other and unspecified hyperlipidemia   . Pain in joint, site unspecified   . Pre-diabetes   . Routine general medical examination at a health care facility   . Sjogren's syndrome ([place]) 10/27/2012  . Tear film insufficiency, unspecified   . Unspecified disorder of kidney and ureter   . Unspecified glaucoma(365.9)    Past Surgical History:  Procedure Laterality Date  . BELPHAROPTOSIS REPAIR     brow lift   bil  . CATARACT EXTRACTION W/ INTRAOCULAR LENS  IMPLANT, BILATERAL Bilateral 12/2014   Dr. Prudencio Burly  . [place]   . DILATION AND CURETTAGE OF UTERUS    . LUMBAR LAMINECTOMY/DECOMPRESSION MICRODISCECTOMY N/A 04/21/2017   Procedure: BILATERAL LUMBAR FOUR- LUMBAR FIVE AND LEFT LUMBAR FIVE-  SACRAL ONE LAMINOTOMY, MEDIAL FACETECTOMY AND FORAMINOTOMIES;  Surgeon: Jovita Gamma, MD;  Location: [facility];  Service: Neurosurgery;  Laterality: N/A;  . NASAL SEPTUM SURGERY     Family History  Problem Relation Age of Onset  . Heart attack Mother   . Breast cancer Mother   . Pancreatitis Father   . Heart disease Brother        By-pass surgery  . Lupus Cousin    Social History   Socioeconomic History  . Marital status: Single    Spouse name: Not on file  . Number of children: Not on file  . Years of education: Not on file  . Highest education level: Not on file  Occupational History  . Occupation: Herbalist  Tobacco Use  . Smoking status: Never Smoker  . Smokeless tobacco: Never Used  Vaping Use  . Vaping Use: Never used  Substance and Sexual Activity  . Alcohol use: Yes    Alcohol/week: 0.0 standard drinks    Comment: once a year  . Drug use: No  . Sexual activity: Not on file  Other Topics Concern  . Not on file  Social History Narrative   Single   No children   Never smoked   Alcohol none   Exercise -stretches, hand weights    Social Determinants of Health   Financial Resource Strain:   . Difficulty of Paying Living Expenses:   Food Insecurity:   . Worried About Charity fundraiser in the Last Year:   . Arboriculturist in the Last Year:   Transportation Needs:   . Film/video editor (Medical):   Marland Kitchen Lack of Transportation (Non-Medical):   Physical Activity:   . Days of Exercise per Week:   . Minutes of Exercise per Session:   Stress:   . Feeling of Stress :   Social Connections:   . Frequency of Communication with Friends and Family:   . Frequency of Social Gatherings with Friends and Family:   . Attends Religious Services:   . Active Member of Clubs or Organizations:   . Attends Archivist Meetings:   Marland Kitchen Marital Status:     Tobacco Counseling Counseling given: Not Answered   Clinical Intake:  Pre-visit preparation  completed: No  Pain : 0-10 Pain Score: 4  Pain Type: Chronic pain Pain Location: Back (hip) Pain Orientation: Right, Left Pain Radiating Towards: no Pain Descriptors / Indicators: Aching Pain Onset: Other (comment) (several years) Pain Frequency: Intermittent Pain Relieving Factors: resting,Aleve,Heat Effect of Pain on Daily Activities: Not able to walk far.cannot do house work  Pain Relieving Factors: resting,Aleve,Heat  BMI - recorded: 27.25 Nutritional Status: BMI 25 -29 Overweight Nutritional Risks: None Diabetes: Yes CBG done?: No Did pt. bring in CBG monitor from home?: No  How often do you need to have someone help you when you read instructions, pamphlets, or other written materials from your doctor or pharmacy?: 1 - Never What is the last grade level you completed in school?: Graduate  Diabetic?yes  Interpreter Needed?: No  Information entered by ::  Rahul Malinak FNP-C   Activities of Daily Living In your present state of health, do you have any difficulty performing the following activities: 10/05/2019  Hearing? N  Vision? N  Difficulty concentrating or making decisions? Y  Comment [place]  Walking or climbing stairs? N  Dressing or bathing? N  Doing errands, shopping? N  Preparing Food and eating ? N  Using the Toilet? N  In the past six months, have you accidently leaked urine? Y  Comment has stress incontinent  Do you have problems with loss of bowel control? N  Managing your Medications? N  Managing your Finances? N  Housekeeping or managing your Housekeeping? Y  Comment has Chartered certified accountant  Some recent data might be hidden    Patient Care Team: Gayland Curry, DO as PCP - General (Geriatric Medicine)  Indicate any recent Medical Services you may have received from other than [facility] providers in the past year (date may be approximate).     Assessment:   This is a routine wellness examination for [place].  Hearing/Vision screen  Hearing  Screening   125Hz  250Hz  500Hz  1000Hz  2000Hz  3000Hz  4000Hz  6000Hz  8000Hz   Right ear:           Left ear:           Comments: No Hearing Concerns.   Vision Screening Comments: No Vision Concerns. Patient wears reading glasses.   Dietary issues and exercise activities discussed: Current Exercise Habits: Home exercise routine, Type of exercise: stretching;strength training/weights;Other - see comments (Statitionary bike), Time (Minutes): 30, Frequency (Times/Week): 5, Weekly Exercise (Minutes/Week): 150, Intensity: Moderate  Goals    . Weight (lb) < 160 lb (72.6 kg)     Starting 08/07/2016 I would like to lose weight by snacking less.    . Weight (lb) < 200 lb (90.7 kg)     I would like to loss weight       Depression Screen PHQ 2/9 Scores 10/05/2019 10/01/2019 03/30/2019 09/21/2018 09/20/2018 03/16/2018 09/12/2017  PHQ - 2 Score 0 0 0 0 0 0 1  PHQ- 9 Score - - - - - - -    Fall Risk Fall Risk  10/05/2019 10/01/2019 05/22/2019 03/30/2019 09/21/2018  Falls in the past year? 0 0 0 0 0  Number falls in past yr: 0 0 0 0 0  Injury with Fall? 0 0 0 0 0  Risk for fall due to : - - - - -  Follow up - - - - -    Any stairs in or around the home? No  If so, are there any without handrails? No  Home free of loose throw rugs in walkways, pet beds, electrical cords, etc? No  Adequate lighting in your home to reduce risk of falls? Yes   ASSISTIVE DEVICES UTILIZED TO PREVENT FALLS:  Life alert? Yes  Use of a cane, walker or w/c? No  Grab bars in the bathroom? Yes  Shower chair or bench in shower? No  Elevated toilet seat or a handicapped toilet? Yes   TIMED UP AND GO:  Was the test performed? No .  Length of time to ambulate 10 feet: N/A  sec.    Cognitive Function: MMSE - Mini Mental State Exam 09/12/2017 08/06/2016 05/09/2015  Orientation to time 5 5 5   Orientation to Place 5 5 5   Registration 3 3 3   Attention/ Calculation 5 5 5   Recall 3 2 3   Language- name 2 objects 2 2 2   Language-  repeat 1 1 1   Language- follow 3 step command 3 3 3   Language- read & follow direction 1 1 1   Write a sentence 1 1 1   Copy design 1 1 1   Total score 30 29 30      6CIT Screen 10/05/2019 09/20/2018  What Year? 0 points 0 points  What month? 0 points 0 points  What time? 0 points 0 points  Count back from 20 0 points 0 points  Months in reverse 0 points 0 points  Repeat phrase 0 points 0 points  Total Score 0 0    Immunizations Immunization History  Administered Date(s) Administered  . Influenza, High Dose Seasonal PF 01/03/2017, 03/16/2018, 02/01/2019  . Influenza,inj,Quad PF,6+ Mos 12/27/2014, 01/15/2016  . Influenza-Unspecified 01/10/2008, 02/15/2011, 01/20/2012  . PFIZER SARS-COV-2 Vaccination 06/10/2019, 07/04/2019  . Pneumococcal Conjugate-13 11/01/2013  . Pneumococcal Polysaccharide-23 09/14/2010, 09/12/2017  . Tdap 04/12/2005  . Zoster 04/12/2005  . Zoster Recombinat (Shingrix) 04/07/2018, 09/28/2018    TDAP status: Up to date Flu Vaccine status: Up to date Pneumococcal vaccine status: Up to date Covid-19 vaccine status: Completed vaccines  Qualifies for Shingles Vaccine? Yes   Zostavax completed Yes   Shingrix Completed?: Yes  Screening Tests Health Maintenance  Topic Date Due  . FOOT EXAM  Never done  . OPHTHALMOLOGY EXAM  Never done  . TETANUS/TDAP  04/13/2015  . INFLUENZA VACCINE  11/11/2019  . Fecal DNA (Cologuard)  02/29/2020  . HEMOGLOBIN A1C  03/28/2020  . MAMMOGRAM  08/20/2021  . DEXA SCAN  Completed  . COVID-19 Vaccine  Completed  . Hepatitis C Screening  Completed  . PNA vac Low Risk Adult  Completed    Health Maintenance  Health Maintenance Due  Topic Date Due  . FOOT EXAM  Never done  . OPHTHALMOLOGY EXAM  Never done  . TETANUS/TDAP  04/13/2015    Colorectal cancer screening: Completed 02/28/2017. Repeat every 02/29/2020  years Mammogram status: Completed 08/21/2019 . Repeat every year Bone Density status: Ordered 10/05/2019 . Pt  provided with contact info and advised to call to schedule appt.  Lung Cancer Screening: (Low Dose CT Chest recommended if Age 65-80 years, 30 pack-year currently smoking OR have quit w/in 15years.) does not qualify.   Lung Cancer Screening Referral:N/A   Additional Screening:  Hepatitis C Screening: does not qualify; Completed   Vision Screening: Recommended annual ophthalmology exams for early detection of glaucoma and other disorders of the eye. Is the patient up to date with their annual eye exam?  Yes  Who is the provider or what is the name of the office in which the patient attends annual eye exams? Dr.Lylees  If pt is not established with a provider, would they like to be referred to a provider to establish care? No .   Dental Screening: Recommended annual dental exams for proper oral hygiene  Community Resource Referral / Chronic Care Management: CRR required this visit?  No   CCM required this visit?  No     Plan:   - reports :  had shingrix vaccine at [facility] on [address].will obtain records from pharmacy. - Also had Tdap vaccine at [facility] on [facility] obtain records.CMA Bridget notified to call pharmacy for records.   I have personally reviewed and noted the following in the patient's chart:   . Medical and social history . Use of alcohol, tobacco or illicit drugs  . Current medications and supplements . Functional ability and status . Nutritional status .  Physical activity . Advanced directives . List of other physicians . Hospitalizations, surgeries, and ER visits in previous 12 months . Vitals . Screenings to include cognitive, depression, and falls . Referrals and appointments  In addition, I have reviewed and discussed with patient certain preventive protocols, quality metrics, and best practice recommendations. A written personalized care plan for preventive services as well as general preventive health recommendations were  provided to patient.   Sandrea Hughs, NP   10/05/2019   Nurse Notes: Dietary modification and exercise advised.

## 2019-10-05 NOTE — Progress Notes (Signed)
    This service is provided via telemedicine  No vital signs collected/recorded due to the encounter was a telemedicine visit.   Location of patient (ex: home, work): Home.  Patient consents to a telephone visit: Yes.  Location of the provider (ex: office, home):  [facility].  Name of any referring provider: N/A  Names of all persons participating in the telemedicine service and their role in the encounter:  Patient, Cosme Jacob, RMA, Ngetich, Dinah, NP.    Time spent on call: 8 minutes spent on the phone with Medical Assistant.   

## 2019-10-05 NOTE — Patient Instructions (Addendum)
[name] , Thank you for taking time to come for your Medicare Wellness Visit. I appreciate your ongoing commitment to your health goals. Please review the following plan we discussed and let me know if I can assist you in the future.   Screening recommendations/referrals: Colonoscopy: Up to date  Mammogram : Up to date  Bone Density: Ordered today imaging place will call you  Recommended yearly ophthalmology/optometry visit for glaucoma screening and checkup Recommended yearly dental visit for hygiene and checkup  Vaccinations: Influenza vaccine : Up to date  Pneumococcal vaccine : Up to date  Tdap vaccine : will [facility] then update record.  Shingles vaccine : will obtain records from [facility] for second dose of shingrix then update records.    Advanced directives: yes   Conditions/risks identified: Advance age female > 81 yrs,type 2 Diabetes mellitus, Hypertension,overweight   Next appointment: 1 year    Preventive Care 65 Years and Older, Female Preventive care refers to lifestyle choices and visits with your health care provider that can promote health and wellness. What does preventive care include?  A yearly physical exam. This is also called an annual well check.  Dental exams once or twice a year.  Routine eye exams. Ask your health care provider how often you should have your eyes checked.  Personal lifestyle choices, including:  Daily care of your teeth and gums.  Regular physical activity.  Eating a healthy diet.  Avoiding tobacco and drug use.  Limiting alcohol use.  Practicing safe sex.  Taking low-dose aspirin every day.  Taking vitamin and mineral supplements as recommended by your health care provider. What happens during an annual well check? The services and screenings done by your health care provider during your annual well check will depend on your age, overall health, lifestyle risk factors, and family history of  disease. Counseling  Your health care provider may ask you questions about your:  Alcohol use.  Tobacco use.  Drug use.  Emotional well-being.  Home and relationship well-being.  Sexual activity.  Eating habits.  History of falls.  Memory and ability to understand (cognition).  Work and work Statistician.  Reproductive health. Screening  You may have the following tests or measurements:  Height, weight, and BMI.  Blood pressure.  Lipid and cholesterol levels. These may be checked every 5 years, or more frequently if you are over 26 years old.  Skin check.  Lung cancer screening. You may have this screening every year starting at age 30 if you have a 30-pack-year history of smoking and currently smoke or have quit within the past 15 years.  Fecal occult blood test (FOBT) of the stool. You may have this test every year starting at age 66.  Flexible sigmoidoscopy or colonoscopy. You may have a sigmoidoscopy every 5 years or a colonoscopy every 10 years starting at age 18.  Hepatitis C blood test.  Hepatitis B blood test.  Sexually transmitted disease (STD) testing.  Diabetes screening. This is done by checking your blood sugar (glucose) after you have not eaten for a while (fasting). You may have this done every 1-3 years.  Bone density scan. This is done to screen for osteoporosis. You may have this done starting at age 15.  Mammogram. This may be done every 1-2 years. Talk to your health care provider about how often you should have regular mammograms. Talk with your health care provider about your test results, treatment options, and if necessary, the  need for more tests. Vaccines  Your health care provider may recommend certain vaccines, such as:  Influenza vaccine. This is recommended every year.  Tetanus, diphtheria, and acellular pertussis (Tdap, Td) vaccine. You may need a Td booster every 10 years.  Zoster vaccine. You may need this after age  25.  Pneumococcal 13-valent conjugate (PCV13) vaccine. One dose is recommended after age 61.  Pneumococcal polysaccharide (PPSV23) vaccine. One dose is recommended after age 7. Talk to your health care provider about which screenings and vaccines you need and how often you need them. This information is not intended to replace advice given to you by your health care provider. Make sure you discuss any questions you have with your health care provider. Document Released: 04/25/2015 Document Revised: 12/17/2015 Document Reviewed: 01/28/2015 Elsevier Interactive Patient Education  2017 [employer] Prevention in the Home Falls can cause injuries. They can happen to people of all ages. There are many things you can do to make your home safe and to help prevent falls. What can I do on the outside of my home?  Regularly fix the edges of walkways and driveways and fix any cracks.  Remove anything that might make you trip as you walk through a door, such as a raised step or threshold.  Trim any bushes or trees on the path to your home.  Use bright outdoor lighting.  Clear any walking paths of anything that might make someone trip, such as rocks or tools.  Regularly check to see if handrails are loose or broken. Make sure that both sides of any steps have handrails.  Any raised decks and porches should have guardrails on the edges.  Have any leaves, snow, or ice cleared regularly.  Use sand or salt on walking paths during winter.  Clean up any spills in your garage right away. This includes oil or grease spills. What can I do in the bathroom?  Use night lights.  Install grab bars by the toilet and in the tub and shower. Do not use towel bars as grab bars.  Use non-skid mats or decals in the tub or shower.  If you need to sit down in the shower, use a plastic, non-slip stool.  Keep the floor dry. Clean up any water that spills on the floor as soon as it happens.  Remove  soap buildup in the tub or shower regularly.  Attach bath mats securely with double-sided non-slip rug tape.  Do not have throw rugs and other things on the floor that can make you trip. What can I do in the bedroom?  Use night lights.  Make sure that you have a light by your bed that is easy to reach.  Do not use any sheets or blankets that are too big for your bed. They should not hang down onto the floor.  Have a firm chair that has side arms. You can use this for support while you get dressed.  Do not have throw rugs and other things on the floor that can make you trip. What can I do in the kitchen?  Clean up any spills right away.  Avoid walking on wet floors.  Keep items that you use a lot in easy-to-reach places.  If you need to reach something above you, use a strong step stool that has a grab bar.  Keep electrical cords out of the way.  Do not use floor polish or wax that makes floors slippery. If you must use wax,  use non-skid floor wax.  Do not have throw rugs and other things on the floor that can make you trip. What can I do with my stairs?  Do not leave any items on the stairs.  Make sure that there are handrails on both sides of the stairs and use them. Fix handrails that are broken or loose. Make sure that handrails are as long as the stairways.  Check any carpeting to make sure that it is firmly attached to the stairs. Fix any carpet that is loose or worn.  Avoid having throw rugs at the top or bottom of the stairs. If you do have throw rugs, attach them to the floor with carpet tape.  Make sure that you have a light switch at the top of the stairs and the bottom of the stairs. If you do not have them, ask someone to add them for you. What else can I do to help prevent falls?  Wear shoes that:  Do not have high heels.  Have rubber bottoms.  Are comfortable and fit you well.  Are closed at the toe. Do not wear sandals.  If you use a  stepladder:  Make sure that it is fully opened. Do not climb a closed stepladder.  Make sure that both sides of the stepladder are locked into place.  Ask someone to hold it for you, if possible.  Clearly mark and make sure that you can see:  Any grab bars or handrails.  First and last steps.  Where the edge of each step is.  Use tools that help you move around (mobility aids) if they are needed. These include:  Canes.  Walkers.  Scooters.  Crutches.  Turn on the lights when you go into a dark area. Replace any light bulbs as soon as they burn out.  Set up your furniture so you have a clear path. Avoid moving your furniture around.  If any of your floors are uneven, fix them.  If there are any pets around you, be aware of where they are.  Review your medicines with your doctor. Some medicines can make you feel dizzy. This can increase your chance of falling. Ask your doctor what other things that you can do to help prevent falls. This information is not intended to replace advice given to you by your health care provider. Make sure you discuss any questions you have with your health care provider. Document Released: 01/23/2009 Document Revised: 09/04/2015 Document Reviewed: 05/03/2014 Elsevier Interactive Patient Education  2017 [employer].

## 2019-10-05 NOTE — Telephone Encounter (Signed)
[name], waddington are scheduled for a virtual visit with your provider today.    Just as we do with appointments in the office, we must obtain your consent to participate.  Your consent will be active for this visit and any virtual visit you may have with one of our providers in the next 365 days.    If you have a MyChart account, I can also send a copy of this consent to you electronically.  All virtual visits are billed to your insurance company just like a traditional visit in the office.  As this is a virtual visit, video technology does not allow for your provider to perform a traditional examination.  This may limit your provider's ability to fully assess your condition.  If your provider identifies any concerns that need to be evaluated in person or the need to arrange testing such as labs, EKG, etc, we will make arrangements to do so.    Although advances in technology are sophisticated, we cannot ensure that it will always work on either your end or our end.  If the connection with a video visit is poor, we may have to switch to a telephone visit.  With either a video or telephone visit, we are not always able to ensure that we have a secure connection.   I need to obtain your verbal consent now.   Are you willing to proceed with your visit today?   Felicia Hall has provided verbal consent on 10/05/2019 for a virtual visit (video or telephone).   [place], [name] 10/05/2019  11:12 AM

## 2019-10-09 DIAGNOSIS — M9905 Segmental and somatic dysfunction of pelvic region: Secondary | ICD-10-CM | POA: Diagnosis not present

## 2019-10-09 DIAGNOSIS — M9901 Segmental and somatic dysfunction of cervical region: Secondary | ICD-10-CM | POA: Diagnosis not present

## 2019-10-09 DIAGNOSIS — M5136 Other intervertebral disc degeneration, lumbar region: Secondary | ICD-10-CM | POA: Diagnosis not present

## 2019-10-09 DIAGNOSIS — M9903 Segmental and somatic dysfunction of lumbar region: Secondary | ICD-10-CM | POA: Diagnosis not present

## 2019-10-18 DIAGNOSIS — M9901 Segmental and somatic dysfunction of cervical region: Secondary | ICD-10-CM | POA: Diagnosis not present

## 2019-10-18 DIAGNOSIS — M9905 Segmental and somatic dysfunction of pelvic region: Secondary | ICD-10-CM | POA: Diagnosis not present

## 2019-10-18 DIAGNOSIS — M9903 Segmental and somatic dysfunction of lumbar region: Secondary | ICD-10-CM | POA: Diagnosis not present

## 2019-10-18 DIAGNOSIS — M5136 Other intervertebral disc degeneration, lumbar region: Secondary | ICD-10-CM | POA: Diagnosis not present

## 2019-10-23 DIAGNOSIS — M9903 Segmental and somatic dysfunction of lumbar region: Secondary | ICD-10-CM | POA: Diagnosis not present

## 2019-10-23 DIAGNOSIS — M9901 Segmental and somatic dysfunction of cervical region: Secondary | ICD-10-CM | POA: Diagnosis not present

## 2019-10-23 DIAGNOSIS — M9905 Segmental and somatic dysfunction of pelvic region: Secondary | ICD-10-CM | POA: Diagnosis not present

## 2019-10-23 DIAGNOSIS — M5136 Other intervertebral disc degeneration, lumbar region: Secondary | ICD-10-CM | POA: Diagnosis not present

## 2019-10-23 NOTE — Progress Notes (Signed)
Cardiology Office Note:    Date:  10/24/2019  Is working recently been crusted ID:  Felicia Hall, DOB December 23, 1945, MRN [id number]  PCP:  Felicia Curry, Felicia Hall  Cardiologist:  No primary care provider on file.  Electrophysiologist:  None   Referring MD: Felicia Curry, Felicia Hall   Chief Complaint  Patient presents with  . Palpitations    History of Present Illness:    Felicia Hall is a 74 y.o. female with a hx of T2DM, GERD, hyperlipidemia, hypertension who is referred by Felicia Hall for evaluation of palpitations.   She reports that palpitations started in May 2021.  She was watching TV and had an episode where she felt her heart was racing.  Lasted for few minutes and resolved.  She denies any lightheadedness or syncope during this episode.  Then earlier in July 2021 had an episode where she felt like her heart was fluttering, lasted few seconds and resolved.  She reports that she exercises by riding stationary bicycle every day for 30 minutes.  She denies any chest pain or dyspnea.  She denies any lower extremity edema.  No smoking history.  Rare alcohol use.  She drinks one latte every morning, no other caffeine throughout the day.  No known history of heart disease in her immediate family, though mother died of unclear cause at age 73 and may have been an MI.  She has had issues with myalgias on statins in the past.    Past Medical History:  Diagnosis Date  . Acute upper respiratory infections of unspecified site   . Allergic rhinitis due to pollen   . Benign essential hypertension   . Cervical spondylosis without myelopathy   . Cervicalgia   . Controlled type 2 diabetes mellitus without complication, without long-term current use of insulin ([place]) 10/01/2019  . Diaphragmatic hernia without mention of obstruction or gangrene   . Disorder of bone and cartilage, unspecified   . Diverticulosis of colon (without mention of hemorrhage)   . Encounter for long-term (current) use of other  medications   . GERD (gastroesophageal reflux disease)   . History of hiatal hernia   . Hyperlipidemia LDL goal < 100   . 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 Other and unspecified hyperlipidemia   . Pain in joint, site unspecified   . Pre-diabetes   . Routine general medical examination at a health care facility   . Sjogren's syndrome ([place]) 10/27/2012  . Tear film insufficiency, unspecified   . Unspecified disorder of kidney and ureter   . Unspecified glaucoma(365.9)     Past Surgical History:  Procedure Laterality Date  . BELPHAROPTOSIS REPAIR     brow lift   bil  . CATARACT EXTRACTION W/ INTRAOCULAR LENS  IMPLANT, BILATERAL Bilateral 12/2014   Dr. Prudencio Burly  . [place]   . DILATION AND CURETTAGE OF UTERUS    . LUMBAR LAMINECTOMY/DECOMPRESSION MICRODISCECTOMY N/A 04/21/2017   Procedure: BILATERAL LUMBAR FOUR- LUMBAR FIVE AND LEFT LUMBAR FIVE- SACRAL ONE LAMINOTOMY, MEDIAL FACETECTOMY AND FORAMINOTOMIES;  Surgeon: Jovita Gamma, MD;  Location: [place] [facility];  Service: Neurosurgery;  Laterality: N/A;  . NASAL SEPTUM SURGERY      Current Medications: Current Meds  Medication Sig  . aspirin EC 81 MG tablet Take 1 tablet (81 mg total) by mouth daily. Swallow whole.  . brinzolamide (AZOPT) 1 % ophthalmic suspension Place 1 drop into both eyes 2 (two) times daily.  . calcium citrate-vitamin  D (CITRACAL+D) 315-200 MG-UNIT per tablet Take 1 tablet by mouth 4 (four) times daily.  . cholecalciferol (VITAMIN D) 1000 UNITS tablet Take 2,000 Units by mouth daily.   . fenofibrate (TRICOR) 145 MG tablet TAKE 1 TABLET BY MOUTH EVERY DAY  . losartan (COZAAR) 50 MG tablet TAKE 1 TABLET BY MOUTH ONCE DAILY FOR BLOOD PRESSURE  . omeprazole (PRILOSEC) 20 MG capsule Take 20 mg by mouth daily.      Allergies:   Statins, Allopurinol, and Darvocet [propoxyphene n-acetaminophen]   Social History   Socioeconomic History  . Marital status: Single    Spouse  name: Not on file  . Number of children: Not on file  . Years of education: Not on file  . Highest education level: Not on file  Occupational History  . Occupation: Herbalist  Tobacco Use  . Smoking status: Never Smoker  . Smokeless tobacco: Never Used  Vaping Use  . Vaping Use: Never used  Substance and Sexual Activity  . Alcohol use: Yes    Alcohol/week: 0.0 standard drinks    Comment: once a year  . Drug use: No  . Sexual activity: Not on file  Other Topics Concern  . Not on file  Social History Narrative   Single   No children   Never smoked   Alcohol none   Exercise -stretches, hand weights    Social Determinants of Health   Financial Resource Strain:   . Difficulty of Paying Living Expenses:   Food Insecurity:   . Worried About Charity fundraiser in the Last Year:   . Arboriculturist in the Last Year:   Transportation Needs:   . Film/video editor (Medical):   Marland Kitchen Lack of Transportation (Non-Medical):   Physical Activity:   . Days of Exercise per Week:   . Minutes of Exercise per Session:   Stress:   . Feeling of Stress :   Social Connections:   . Frequency of Communication with Friends and Family:   . Frequency of Social Gatherings with Friends and Family:   . Attends Religious Services:   . Active Member of Clubs or Organizations:   . Attends Archivist Meetings:   Marland Kitchen Marital Status:      Family History: The patient's family history includes Breast cancer in her mother; Heart attack in her mother; Heart disease in her brother; Lupus in her cousin; Pancreatitis in her father.  ROS:   Please see the history of present illness.     All other systems reviewed and are negative.  EKGs/Labs/Other Studies Reviewed:    The following studies were reviewed today:   EKG:  EKG is ordered today.  The ekg ordered today demonstrates sinus rhythm, first-degree AV block, no ST changes  Recent Labs: 09/27/2019: ALT 21; BUN 14; Creat 0.92;  Hemoglobin 14.8; Platelets 301; Potassium 4.2; Sodium 137  Recent Lipid Panel    Component Value Date/Time   CHOL 167 09/27/2019 0825   CHOL 189 09/10/2015 0848   TRIG 115 09/27/2019 0825   HDL 37 (L) 09/27/2019 0825   HDL 39 (L) 09/10/2015 0848   CHOLHDL 4.5 09/27/2019 0825   VLDL 36 (H) 08/06/2016 0825   LDLCALC 107 (H) 09/27/2019 0825    Physical Exam:    VS:  BP 128/80   Pulse 84   Temp (!) 96.8 F (36 C)   Ht 5\' 7"  (1.702 m)   Wt 166 lb (75.3 kg)   SpO2 97%  BMI 26.00 kg/m     Wt Readings from Last 3 Encounters:  10/24/19 166 lb (75.3 kg)  10/01/19 174 lb (78.9 kg)  03/30/19 168 lb (76.2 kg)     GEN: Well nourished, well developed in no acute distress HEENT: Normal NECK: No JVD; No carotid bruits LYMPHATICS: No lymphadenopathy CARDIAC: RRR, no murmurs, rubs, gallops RESPIRATORY:  Clear to auscultation without rales, wheezing or rhonchi  ABDOMEN: Soft, non-tender, non-distended MUSCULOSKELETAL:  No edema; No deformity  SKIN: Warm and dry NEUROLOGIC:  Alert and oriented x 3 PSYCHIATRIC:  Normal affect   ASSESSMENT:    1. Palpitations   2. Hyperlipidemia, unspecified hyperlipidemia type   3. Essential hypertension    PLAN:    Palpitations: Description concerning for arrhythmia, will check Zio patch x2 weeks.  T2DM: A1c 6.5 on 09/27/2019.  Hyperlipidemia: Fenofibrate 145 mg daily.  LDL 107 on 09/27/2019.  Meets indication for starting statin given history of diabetes, but patient would prefer to hold off at this time given past issues with myalgias.  Will check calcium score for further risk ratification.  Hypertension: On losartan 50 mg daily.  Appears controlled  RTC in 3 months   Medication Adjustments/Labs and Tests Ordered: Current medicines are reviewed at length with the patient today.  Concerns regarding medicines are outlined above.  Orders Placed This Encounter  Procedures  . CT CARDIAC SCORING  . LONG TERM MONITOR (3-14 DAYS)  . EKG  12-Lead   No orders of the defined types were placed in this encounter.   Patient Instructions  Medication Instructions:  Your physician recommends that you continue on your current medications as directed. Please refer to the Current Medication list given to you today.  Testing/Procedures: CT coronary calcium score. This test is done at [address]. This is $150 out of pocket.   Coronary CalciumScan A coronary calcium scan is an imaging test used to look for deposits of calcium and other fatty materials (plaques) in the inner lining of the blood vessels of the heart (coronary arteries). These deposits of calcium and plaques can partly clog and narrow the coronary arteries without producing any symptoms or warning signs. This puts a person at risk for a heart attack. This test can detect these deposits before symptoms develop. Tell a health care provider about:  Any allergies you have.  All medicines you are taking, including vitamins, herbs, eye drops, creams, and over-the-counter medicines.  Any problems you or family members have had with anesthetic medicines.  Any blood disorders you have.  Any surgeries you have had.  Any medical conditions you have.  Whether you are pregnant or may be pregnant. What are the risks? Generally, this is a safe procedure. However, problems may occur, including:  Harm to a pregnant woman and her unborn baby. This test involves the use of radiation. Radiation exposure can be dangerous to a pregnant woman and her unborn baby. If you are pregnant, you generally should not have this procedure done.  Slight increase in the risk of cancer. This is because of the radiation involved in the test. What happens before the procedure? No preparation is needed for this procedure. What happens during the procedure?  You will undress and remove any jewelry around your neck or chest.  You will put on a hospital gown.  Sticky electrodes  will be placed on your chest. The electrodes will be connected to an electrocardiogram (ECG) machine to record a tracing of the electrical activity  of your heart.  A CT scanner will take pictures of your heart. During this time, you will be asked to lie still and hold your breath for 2-3 seconds while a picture of your heart is being taken. The procedure may vary among health care providers and hospitals. What happens after the procedure?  You can get dressed.  You can return to your normal activities.  It is up to you to get the results of your test. Ask your health care provider, or the department that is doing the test, when your results will be ready. Summary  A coronary calcium scan is an imaging test used to look for deposits of calcium and other fatty materials (plaques) in the inner lining of the blood vessels of the heart (coronary arteries).  Generally, this is a safe procedure. Tell your health care provider if you are pregnant or may be pregnant.  No preparation is needed for this procedure.  A CT scanner will take pictures of your heart.  You can return to your normal activities after the scan is done. This information is not intended to replace advice given to you by your health care provider. Make sure you discuss any questions you have with your health care provider. Document Released: 09/25/2007 Document Revised: 02/16/2016 Document Reviewed: 02/16/2016 Elsevier Interactive Patient Education  2017 [place] Term Monitor Instructions   Your physician has requested you wear your ZIO patch monitor 14 days.   This is a single patch monitor.  [employer] supplies one patch monitor per enrollment.  Additional stickers are not available.   Please Felicia Hall not apply patch if you will be having a Nuclear Stress Test, Echocardiogram, Cardiac CT, MRI, or Chest Xray during the time frame you would be wearing the monitor. The patch cannot be worn during these tests.   You cannot remove and re-apply the ZIO XT patch monitor.   Your ZIO patch monitor will be sent USPS Priority mail from [facility] directly to your home address. The monitor may also be mailed to a PO BOX if home delivery is not available.   It may take 3-5 days to receive your monitor after you have been enrolled.   Once you have received you monitor, please review enclosed instructions.  Your monitor has already been registered assigning a specific monitor serial # to you.   Applying the monitor   Shave hair from upper left chest.   Hold abrader disc by orange tab.  Rub abrader in 40 strokes over left upper chest as indicated in your monitor instructions.   Clean area with 4 enclosed alcohol pads .  Use all pads to assure are is cleaned thoroughly.  Let dry.   Apply patch as indicated in monitor instructions.  Patch will be place under collarbone on left side of chest with arrow pointing upward.   Rub patch adhesive wings for 2 minutes.Remove white label marked "1".  Remove white label marked "2".  Rub patch adhesive wings for 2 additional minutes.   While looking in a mirror, press and release button in center of patch.  A small green light will flash 3-4 times .  This will be your only indicator the monitor has been turned on.     Felicia Hall not shower for the first 24 hours.  You may shower after the first 24 hours.   Press button if you feel a symptom. You will hear a small click.  Record Date, Time and  Symptom in the Patient Log Book.   When you are ready to remove patch, follow instructions on last 2 pages of Patient Log Book.  Stick patch monitor onto last page of Patient Log Book.   Place Patient Log Book in Black box.  Use locking tab on box and tape box closed securely.  The [employer] and [employer] has [employer] on it.  Please place in mailbox as soon as possible.  Your physician should have your test results approximately 7 days after the monitor has been mailed back to  [facility]   Call [place] at [phone] if you have questions regarding your ZIO XT patch monitor.  Call them immediately if you see an orange light blinking on your monitor.   If your monitor falls off in less than 4 days contact our Monitor department at [phone].  If your monitor becomes loose or falls off after 4 days call [employer] at [phone] for suggestions on securing your monitor.    Follow-Up: At [facility], you and your health needs are our priority.  As part of our continuing mission to provide you with exceptional heart care, we have created designated Provider Care Teams.  These Care Teams include your primary Cardiologist (physician) and Advanced Practice Providers (APPs -  Physician Assistants and Nurse Practitioners) who all work together to provide you with the care you need, when you need it.  We recommend signing up for the patient portal called "MyChart".  Sign up information is provided on this After Visit Summary.  MyChart is used to connect with patients for Virtual Visits (Telemedicine).  Patients are able to view lab/test results, encounter notes, upcoming appointments, etc.  Non-urgent messages can be sent to your provider as well.   To learn more about what you can Felicia Hall with MyChart, go to [email or web address].    Your next appointment:   3 month(s)  The format for your next appointment:   In Person  Provider:   Oswaldo Milian, MD        Signed, Donato Heinz, MD  10/24/2019 11:50 AM    [place]

## 2019-10-24 ENCOUNTER — Ambulatory Visit: Payer: Medicare Other | Admitting: Cardiology

## 2019-10-24 ENCOUNTER — Other Ambulatory Visit: Payer: Self-pay

## 2019-10-24 ENCOUNTER — Telehealth: Payer: Self-pay | Admitting: Radiology

## 2019-10-24 ENCOUNTER — Encounter: Payer: Self-pay | Admitting: Cardiology

## 2019-10-24 VITALS — BP 128/80 | HR 84 | Temp 96.8°F | Ht 67.0 in | Wt 166.0 lb

## 2019-10-24 DIAGNOSIS — R002 Palpitations: Secondary | ICD-10-CM

## 2019-10-24 DIAGNOSIS — I10 Essential (primary) hypertension: Secondary | ICD-10-CM | POA: Diagnosis not present

## 2019-10-24 DIAGNOSIS — E785 Hyperlipidemia, unspecified: Secondary | ICD-10-CM | POA: Diagnosis not present

## 2019-10-24 NOTE — Telephone Encounter (Signed)
Enrolled patient for a 14 day Zio monitor to be mailed to patients home.  

## 2019-10-24 NOTE — Patient Instructions (Signed)
Medication Instructions:  Your physician recommends that you continue on your current medications as directed. Please refer to the Current Medication list given to you today.  Testing/Procedures: CT coronary calcium score. This test is done at [address]. This is $150 out of pocket.   Coronary CalciumScan A coronary calcium scan is an imaging test used to look for deposits of calcium and other fatty materials (plaques) in the inner lining of the blood vessels of the heart (coronary arteries). These deposits of calcium and plaques can partly clog and narrow the coronary arteries without producing any symptoms or warning signs. This puts a person at risk for a heart attack. This test can detect these deposits before symptoms develop. Tell a health care provider about:  Any allergies you have.  All medicines you are taking, including vitamins, herbs, eye drops, creams, and over-the-counter medicines.  Any problems you or family members have had with anesthetic medicines.  Any blood disorders you have.  Any surgeries you have had.  Any medical conditions you have.  Whether you are pregnant or may be pregnant. What are the risks? Generally, this is a safe procedure. However, problems may occur, including:  Harm to a pregnant woman and her unborn baby. This test involves the use of radiation. Radiation exposure can be dangerous to a pregnant woman and her unborn baby. If you are pregnant, you generally should not have this procedure done.  Slight increase in the risk of cancer. This is because of the radiation involved in the test. What happens before the procedure? No preparation is needed for this procedure. What happens during the procedure?  You will undress and remove any jewelry around your neck or chest.  You will put on a hospital gown.  Sticky electrodes will be placed on your chest. The electrodes will be connected to an electrocardiogram (ECG) machine  to record a tracing of the electrical activity of your heart.  A CT scanner will take pictures of your heart. During this time, you will be asked to lie still and hold your breath for 2-3 seconds while a picture of your heart is being taken. The procedure may vary among health care providers and hospitals. What happens after the procedure?  You can get dressed.  You can return to your normal activities.  It is up to you to get the results of your test. Ask your health care provider, or the department that is doing the test, when your results will be ready. Summary  A coronary calcium scan is an imaging test used to look for deposits of calcium and other fatty materials (plaques) in the inner lining of the blood vessels of the heart (coronary arteries).  Generally, this is a safe procedure. Tell your health care provider if you are pregnant or may be pregnant.  No preparation is needed for this procedure.  A CT scanner will take pictures of your heart.  You can return to your normal activities after the scan is done. This information is not intended to replace advice given to you by your health care provider. Make sure you discuss any questions you have with your health care provider. Document Released: 09/25/2007 Document Revised: 02/16/2016 Document Reviewed: 02/16/2016 Elsevier Interactive Patient Education  2017 [place] Term Monitor Instructions   Your physician has requested you wear your ZIO patch monitor 14 days.   This is a single patch monitor.  [employer] supplies one patch monitor  per enrollment.  Additional stickers are not available.   Please do not apply patch if you will be having a Nuclear Stress Test, Echocardiogram, Cardiac CT, MRI, or Chest Xray during the time frame you would be wearing the monitor. The patch cannot be worn during these tests.  You cannot remove and re-apply the ZIO XT patch monitor.   Your ZIO patch monitor will be sent USPS  Priority mail from [facility] directly to your home address. The monitor may also be mailed to a PO BOX if home delivery is not available.   It may take 3-5 days to receive your monitor after you have been enrolled.   Once you have received you monitor, please review enclosed instructions.  Your monitor has already been registered assigning a specific monitor serial # to you.   Applying the monitor   Shave hair from upper left chest.   Hold abrader disc by orange tab.  Rub abrader in 40 strokes over left upper chest as indicated in your monitor instructions.   Clean area with 4 enclosed alcohol pads .  Use all pads to assure are is cleaned thoroughly.  Let dry.   Apply patch as indicated in monitor instructions.  Patch will be place under collarbone on left side of chest with arrow pointing upward.   Rub patch adhesive wings for 2 minutes.Remove white label marked "1".  Remove white label marked "2".  Rub patch adhesive wings for 2 additional minutes.   While looking in a mirror, press and release button in center of patch.  A small green light will flash 3-4 times .  This will be your only indicator the monitor has been turned on.     Do not shower for the first 24 hours.  You may shower after the first 24 hours.   Press button if you feel a symptom. You will hear a small click.  Record Date, Time and Symptom in the Patient Log Book.   When you are ready to remove patch, follow instructions on last 2 pages of Patient Log Book.  Stick patch monitor onto last page of Patient Log Book.   Place Patient Log Book in [place] box.  Use locking tab on box and tape box closed securely.  The [employer] and [employer] has [employer] on it.  Please place in mailbox as soon as possible.  Your physician should have your test results approximately 7 days after the monitor has been mailed back to [facility].   Call [place] at [phone] if you have questions regarding  your ZIO XT patch monitor.  Call them immediately if you see an orange light blinking on your monitor.   If your monitor falls off in less than 4 days contact our Monitor department at [phone].  If your monitor becomes loose or falls off after 4 days call [employer] at [phone] for suggestions on securing your monitor.    Follow-Up: At [facility], you and your health needs are our priority.  As part of our continuing mission to provide you with exceptional heart care, we have created designated Provider Care Teams.  These Care Teams include your primary Cardiologist (physician) and Advanced Practice Providers (APPs -  Physician Assistants and Nurse Practitioners) who all work together to provide you with the care you need, when you need it.  We recommend signing up for the patient portal called "MyChart".  Sign up information is provided on this After Visit Summary.  MyChart is  used to connect with patients for Virtual Visits (Telemedicine).  Patients are able to view lab/test results, encounter notes, upcoming appointments, etc.  Non-urgent messages can be sent to your provider as well.   To learn more about what you can do with MyChart, go to [email or web address].    Your next appointment:   3 month(s)  The format for your next appointment:   In Person  Provider:   Oswaldo Milian, MD

## 2019-10-27 ENCOUNTER — Other Ambulatory Visit (INDEPENDENT_AMBULATORY_CARE_PROVIDER_SITE_OTHER): Payer: Medicare Other

## 2019-10-27 DIAGNOSIS — R002 Palpitations: Secondary | ICD-10-CM

## 2019-10-30 DIAGNOSIS — M9905 Segmental and somatic dysfunction of pelvic region: Secondary | ICD-10-CM | POA: Diagnosis not present

## 2019-10-30 DIAGNOSIS — M9903 Segmental and somatic dysfunction of lumbar region: Secondary | ICD-10-CM | POA: Diagnosis not present

## 2019-10-30 DIAGNOSIS — M5136 Other intervertebral disc degeneration, lumbar region: Secondary | ICD-10-CM | POA: Diagnosis not present

## 2019-10-30 DIAGNOSIS — M9901 Segmental and somatic dysfunction of cervical region: Secondary | ICD-10-CM | POA: Diagnosis not present

## 2019-11-01 ENCOUNTER — Other Ambulatory Visit: Payer: Medicare Other

## 2019-11-02 DIAGNOSIS — H04123 Dry eye syndrome of bilateral lacrimal glands: Secondary | ICD-10-CM | POA: Diagnosis not present

## 2019-11-08 DIAGNOSIS — M9905 Segmental and somatic dysfunction of pelvic region: Secondary | ICD-10-CM | POA: Diagnosis not present

## 2019-11-08 DIAGNOSIS — M9901 Segmental and somatic dysfunction of cervical region: Secondary | ICD-10-CM | POA: Diagnosis not present

## 2019-11-08 DIAGNOSIS — M9903 Segmental and somatic dysfunction of lumbar region: Secondary | ICD-10-CM | POA: Diagnosis not present

## 2019-11-08 DIAGNOSIS — M5136 Other intervertebral disc degeneration, lumbar region: Secondary | ICD-10-CM | POA: Diagnosis not present

## 2019-11-12 ENCOUNTER — Ambulatory Visit (INDEPENDENT_AMBULATORY_CARE_PROVIDER_SITE_OTHER)
Admission: RE | Admit: 2019-11-12 | Discharge: 2019-11-12 | Disposition: A | Discharge status: Home / Self Care | Payer: Self-pay | Source: Ambulatory Visit | Attending: Cardiology | Admitting: Cardiology

## 2019-11-12 ENCOUNTER — Other Ambulatory Visit: Payer: Self-pay

## 2019-11-12 DIAGNOSIS — E785 Hyperlipidemia, unspecified: Secondary | ICD-10-CM

## 2019-11-12 DIAGNOSIS — I700 Atherosclerosis of aorta: Secondary | ICD-10-CM | POA: Diagnosis not present

## 2019-11-16 ENCOUNTER — Other Ambulatory Visit: Payer: Self-pay | Admitting: Internal Medicine

## 2019-11-16 NOTE — Telephone Encounter (Signed)
rx sent to pharmacy by e-script  

## 2019-11-20 DIAGNOSIS — M9905 Segmental and somatic dysfunction of pelvic region: Secondary | ICD-10-CM | POA: Diagnosis not present

## 2019-11-20 DIAGNOSIS — M9901 Segmental and somatic dysfunction of cervical region: Secondary | ICD-10-CM | POA: Diagnosis not present

## 2019-11-20 DIAGNOSIS — M5136 Other intervertebral disc degeneration, lumbar region: Secondary | ICD-10-CM | POA: Diagnosis not present

## 2019-11-20 DIAGNOSIS — R002 Palpitations: Secondary | ICD-10-CM | POA: Diagnosis not present

## 2019-11-20 DIAGNOSIS — M9903 Segmental and somatic dysfunction of lumbar region: Secondary | ICD-10-CM | POA: Diagnosis not present

## 2019-11-21 ENCOUNTER — Other Ambulatory Visit: Payer: Self-pay | Admitting: *Deleted

## 2019-11-21 MED ORDER — ROSUVASTATIN CALCIUM 10 MG PO TABS: 10.0000 mg | ORAL_TABLET | Freq: Every day | ORAL | 3 refills | Status: DC

## 2019-11-29 DIAGNOSIS — M5134 Other intervertebral disc degeneration, thoracic region: Secondary | ICD-10-CM | POA: Diagnosis not present

## 2019-11-29 DIAGNOSIS — M5136 Other intervertebral disc degeneration, lumbar region: Secondary | ICD-10-CM | POA: Diagnosis not present

## 2019-11-29 DIAGNOSIS — M9902 Segmental and somatic dysfunction of thoracic region: Secondary | ICD-10-CM | POA: Diagnosis not present

## 2019-11-29 DIAGNOSIS — M9903 Segmental and somatic dysfunction of lumbar region: Secondary | ICD-10-CM | POA: Diagnosis not present

## 2019-12-02 IMAGING — RF DG ESOPHAGUS
5 series · 14 of 21 positions shown · non-contrast
Comparison: CT abdomen pelvis 05/23/2016

CLINICAL DATA: History of hiatal hernia, sensation of food sticking
in throat

EXAM:
ESOPHOGRAM / BARIUM SWALLOW / BARIUM TABLET STUDY
TECHNIQUE: Combined double contrast and single contrast examination performed
using effervescent crystals, thick barium liquid, and thin barium
liquid. The patient was observed with fluoroscopy swallowing a 13 mm
barium sulphate tablet.
FLUOROSCOPY TIME:  Fluoroscopy Time:  1 minutes 12 seconds
Radiation Exposure Index (if provided by the fluoroscopic device):
107 mGy
Number of Acquired Spot Images: 0

[Series 1: sequence · 3 of 10 frames shown (1 of 4)]
[frame 2/10]
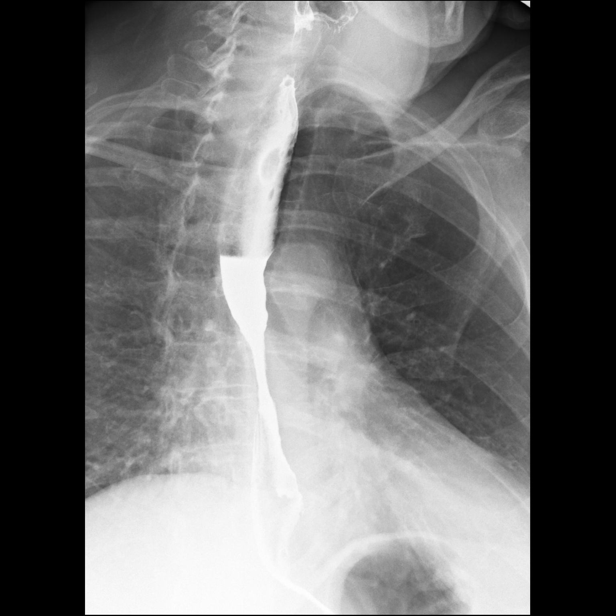
[frame 6/10]
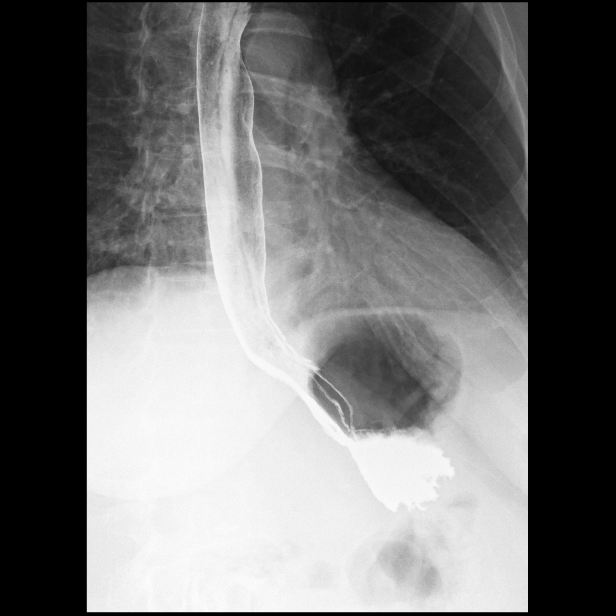
[frame 9/10]
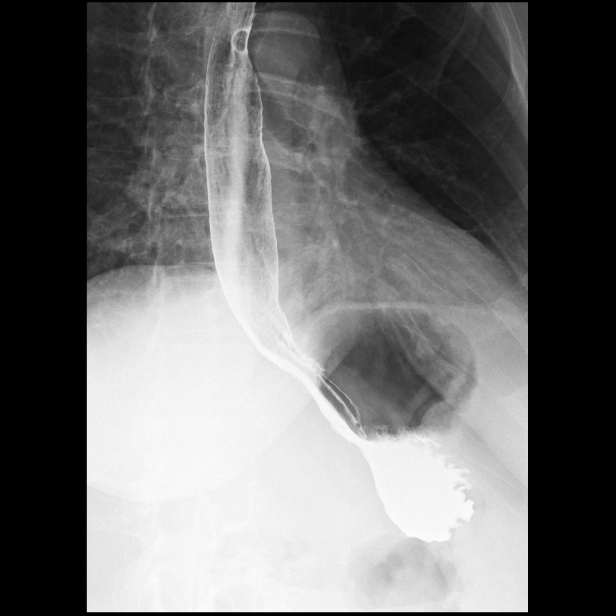

[Series 2: sequence · 2 of 21 frames shown (2 of 4)]
[frame 11/21]
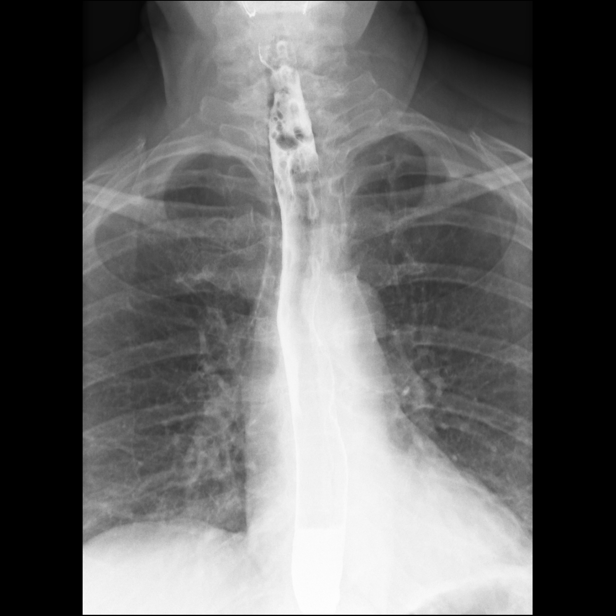
[frame 18/21]
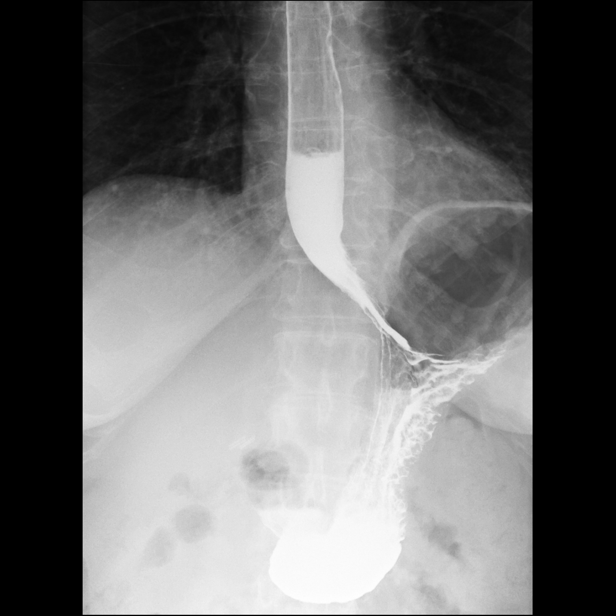

[Series 3: sequence · 2 of 14 frames shown (3 of 4)]
[frame 8/14]
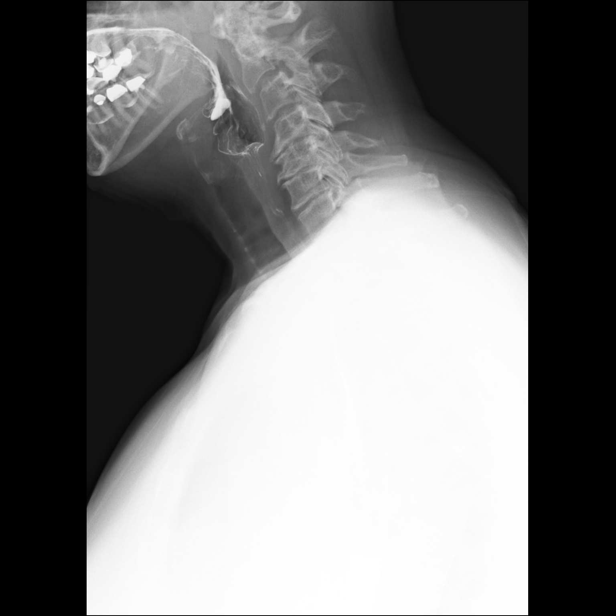
[frame 12/14]
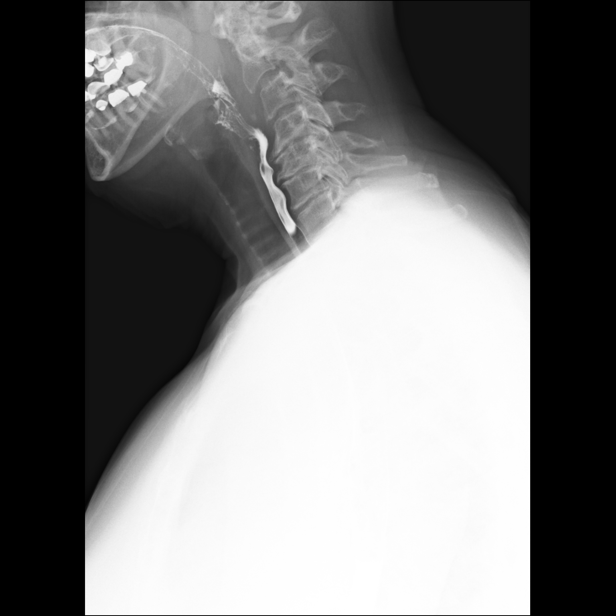

[Series 4: sequence · 3 of 45 frames shown (4 of 4)]
[frame 7/45]
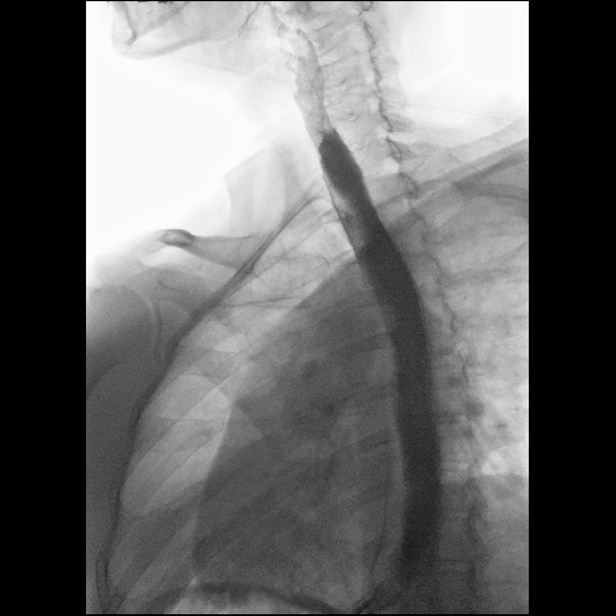
[frame 21/45]
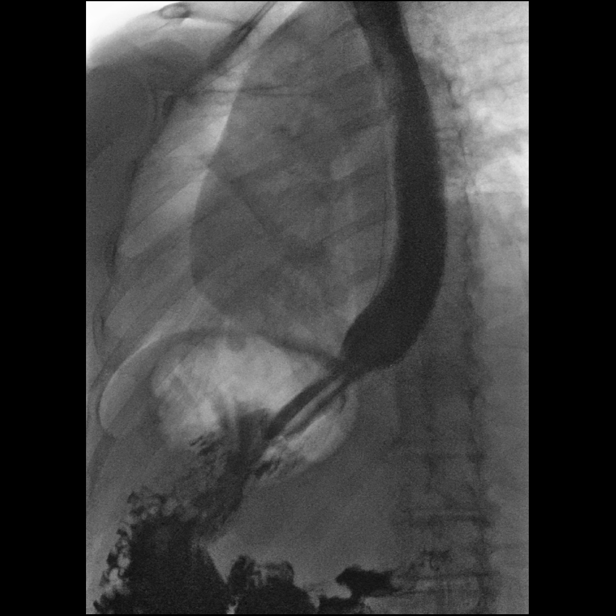
[frame 39/45]
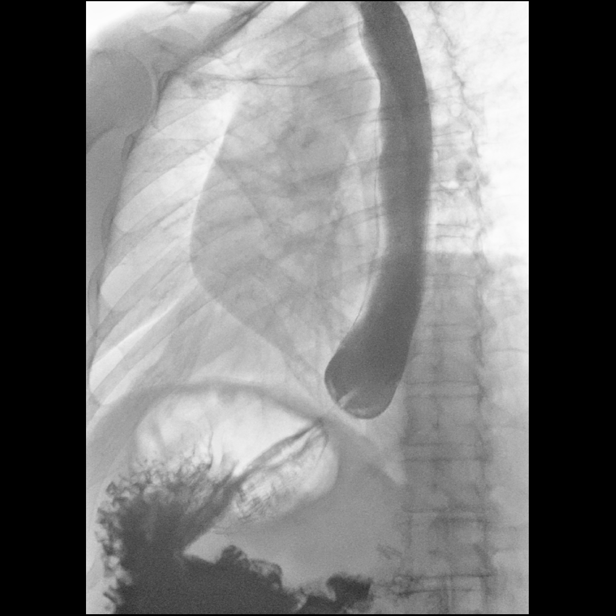

[Series 5: one shot · 4 of 6 slices shown]
[im 1/6]
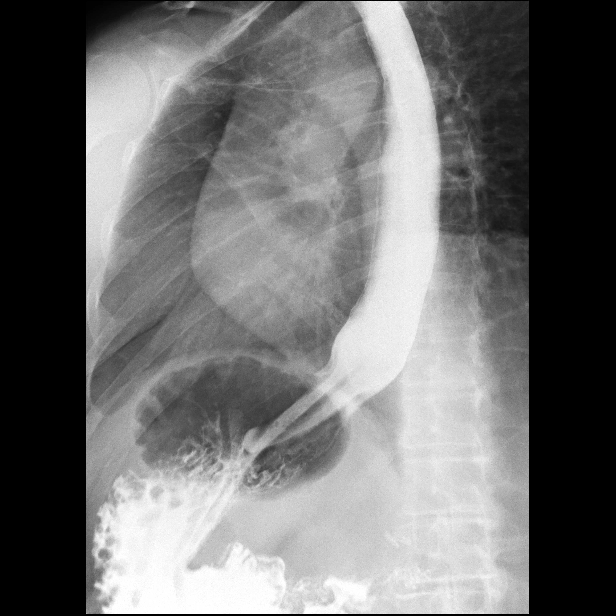
[im 3/6]
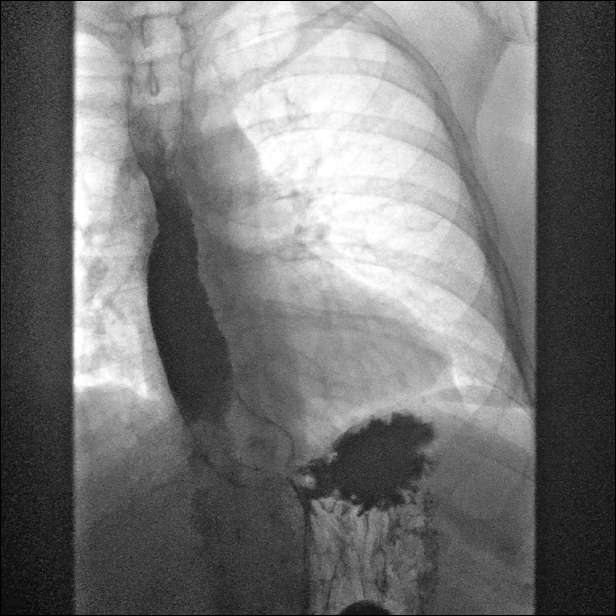
[im 4/6]
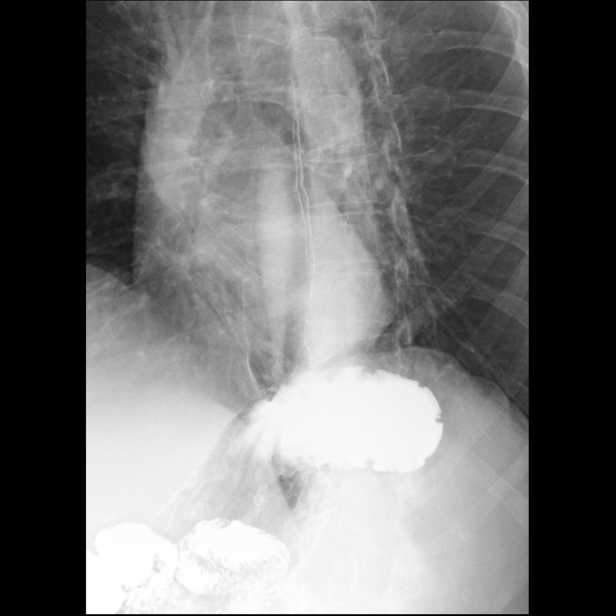
[im 6/6]
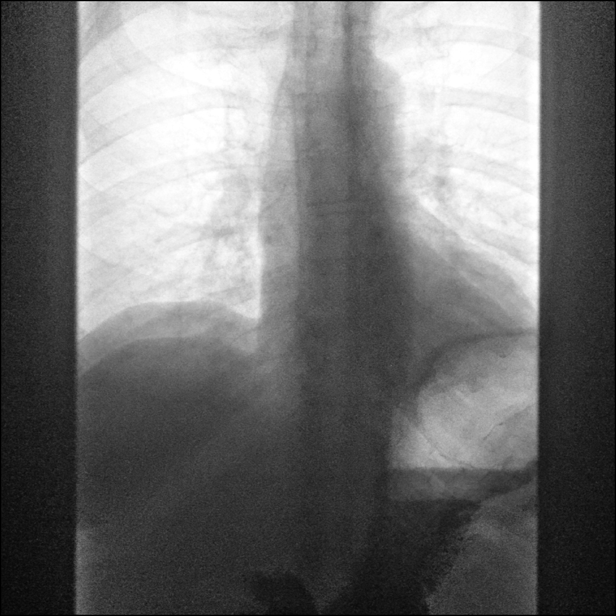

[14 of 21 positions shown; findings below may reference images not displayed]

FINDINGS: Initially double-contrast barium swallow was performed. The mucosa
of the esophagus is unremarkable. Single contrast study shows the
swallowing mechanism to be normal. Rapid sequence spot films show no
evidence of aspiration or penetration. A barium pill was given at
the end the study which passed without delay into the stomach. There
is very slight narrowing of the lower cervical esophagus from the
right of midline which could be due to prominence of the right lobe
of thyroid or within normal limits. Mild tertiary contractions are
noted distally. No hiatal hernia is evident even with the Valsalva
maneuver. Faint gastroesophageal reflux is demonstrated.
IMPRESSION: 1. Mild to moderate gastroesophageal reflux. Barium pill passes into
the stomach without delay.
2. No hiatal hernia is seen.
3. Mild tertiary contractions distally.

## 2019-12-11 DIAGNOSIS — M9903 Segmental and somatic dysfunction of lumbar region: Secondary | ICD-10-CM | POA: Diagnosis not present

## 2019-12-11 DIAGNOSIS — M5136 Other intervertebral disc degeneration, lumbar region: Secondary | ICD-10-CM | POA: Diagnosis not present

## 2019-12-11 DIAGNOSIS — M9902 Segmental and somatic dysfunction of thoracic region: Secondary | ICD-10-CM | POA: Diagnosis not present

## 2019-12-11 DIAGNOSIS — M5134 Other intervertebral disc degeneration, thoracic region: Secondary | ICD-10-CM | POA: Diagnosis not present

## 2019-12-20 DIAGNOSIS — M8589 Other specified disorders of bone density and structure, multiple sites: Secondary | ICD-10-CM | POA: Diagnosis not present

## 2019-12-20 LAB — HM DEXA SCAN: HM Dexa Scan: -1.8

## 2019-12-25 DIAGNOSIS — M5134 Other intervertebral disc degeneration, thoracic region: Secondary | ICD-10-CM | POA: Diagnosis not present

## 2019-12-25 DIAGNOSIS — M5136 Other intervertebral disc degeneration, lumbar region: Secondary | ICD-10-CM | POA: Diagnosis not present

## 2019-12-25 DIAGNOSIS — M9903 Segmental and somatic dysfunction of lumbar region: Secondary | ICD-10-CM | POA: Diagnosis not present

## 2019-12-25 DIAGNOSIS — M9902 Segmental and somatic dysfunction of thoracic region: Secondary | ICD-10-CM | POA: Diagnosis not present

## 2020-01-07 ENCOUNTER — Ambulatory Visit: Payer: Medicare Other | Attending: Internal Medicine

## 2020-01-07 ENCOUNTER — Other Ambulatory Visit (HOSPITAL_BASED_OUTPATIENT_CLINIC_OR_DEPARTMENT_OTHER): Payer: Self-pay | Admitting: Internal Medicine

## 2020-01-07 DIAGNOSIS — Z23 Encounter for immunization: Secondary | ICD-10-CM

## 2020-01-07 NOTE — Progress Notes (Signed)
   Covid-19 Vaccination Clinic  Name:  Felicia Hall    MRN: [id number] DOB: Jul 08, 1945  01/07/2020  Felicia Hall was observed post Covid-19 immunization for 15 minutes without incident. She was provided with Vaccine Information Sheet and instruction to access the V-Safe system. Vaccinated By: Jeronimo Norma.  Felicia Hall was instructed to call 911 with any severe reactions post vaccine: Marland Kitchen Difficulty breathing  . Swelling of face and throat  . A fast heartbeat  . A bad rash all over body  . Dizziness and weakness

## 2020-01-08 DIAGNOSIS — M5136 Other intervertebral disc degeneration, lumbar region: Secondary | ICD-10-CM | POA: Diagnosis not present

## 2020-01-08 DIAGNOSIS — M9902 Segmental and somatic dysfunction of thoracic region: Secondary | ICD-10-CM | POA: Diagnosis not present

## 2020-01-08 DIAGNOSIS — M9903 Segmental and somatic dysfunction of lumbar region: Secondary | ICD-10-CM | POA: Diagnosis not present

## 2020-01-08 DIAGNOSIS — M5134 Other intervertebral disc degeneration, thoracic region: Secondary | ICD-10-CM | POA: Diagnosis not present

## 2020-01-15 DIAGNOSIS — M9902 Segmental and somatic dysfunction of thoracic region: Secondary | ICD-10-CM | POA: Diagnosis not present

## 2020-01-15 DIAGNOSIS — M5134 Other intervertebral disc degeneration, thoracic region: Secondary | ICD-10-CM | POA: Diagnosis not present

## 2020-01-15 DIAGNOSIS — M5136 Other intervertebral disc degeneration, lumbar region: Secondary | ICD-10-CM | POA: Diagnosis not present

## 2020-01-15 DIAGNOSIS — M9903 Segmental and somatic dysfunction of lumbar region: Secondary | ICD-10-CM | POA: Diagnosis not present

## 2020-01-15 MED FILL — PFIZER-BIONTECH COVID-19 VA: 30 | 1 days supply | Qty: 0 | Fill #0

## 2020-01-20 NOTE — Progress Notes (Signed)
Cardiology Office Note:    Date:  01/24/2020  ID:  Felicia Hall, DOB 1945-05-30, MRN [id number]  PCP:  Gayland Curry, DO  Cardiologist:  No primary care provider on file.  Electrophysiologist:  None   Referring MD: Gayland Curry, DO   Chief Complaint  Patient presents with  . Coronary Artery Disease    History of Present Illness:    Felicia Hall is a 74 y.o. female with a hx of T2DM, GERD, hyperlipidemia, hypertension who presents for follow-up.  She was referred by Dr. Mariea Clonts for evaluation of palpitations, sweating on 10/24/2019.   She reports that palpitations started in May 2021.  She was watching TV and had an episode where she felt her heart was racing.  Lasted for few minutes and resolved.  She denies any lightheadedness or syncope during this episode.  Then earlier in July 2021 had an episode where she felt like her heart was fluttering, lasted few seconds and resolved.  She reports that she exercises by riding stationary bicycle every day for 30 minutes.  She denies any chest pain or dyspnea.  She denies any lower extremity edema.  No smoking history.  Rare alcohol use.  She drinks one latte every morning, no other caffeine throughout the day.  Brother had CABG in 5s.  She has had issues with myalgias on statins in the past.  Zio patch x14 days on 11/21/2019 showed no significant arrhythmias.  Calcium score on 8/2/221 was 463 (86 percentile).  Since last clinic visit, she reports that her palpitations have resolved.  Does states she has been having epigastric/lower chest pain.  Described as pressure, occurs few times per month.  Can occur with eating but also occurs when she has not eaten.  Has not noted relationship with exertion.  She has been doing stationary bike for 30 minutes/day, denies any exertional symptoms.  Denies any lightheadedness, syncope, or lower extremity edema.  States that she has been taking her statin, is tolerating well.    Past Medical History:   Diagnosis Date  . Acute upper respiratory infections of unspecified site   . Allergic rhinitis due to pollen   . Benign essential hypertension   . Cervical spondylosis without myelopathy   . Cervicalgia   . Controlled type 2 diabetes mellitus without complication, without long-term current use of insulin ([place]) 10/01/2019  . Diaphragmatic hernia without mention of obstruction or gangrene   . Disorder of bone and cartilage, unspecified   . Diverticulosis of colon (without mention of hemorrhage)   . Encounter for long-term (current) use of other medications   . GERD (gastroesophageal reflux disease)   . History of hiatal hernia   . Hyperlipidemia LDL goal < 100   . 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 Other and unspecified hyperlipidemia   . Pain in joint, site unspecified   . Pre-diabetes   . Routine general medical examination at a health care facility   . Sjogren's syndrome ([place]) 10/27/2012  . Tear film insufficiency, unspecified   . Unspecified disorder of kidney and ureter   . Unspecified glaucoma(365.9)     Past Surgical History:  Procedure Laterality Date  . BELPHAROPTOSIS REPAIR     brow lift   bil  . CATARACT EXTRACTION W/ INTRAOCULAR LENS  IMPLANT, BILATERAL Bilateral 12/2014   Dr. Prudencio Burly  . [place]   . DILATION AND CURETTAGE OF UTERUS    . LUMBAR LAMINECTOMY/DECOMPRESSION MICRODISCECTOMY N/A 04/21/2017  Procedure: BILATERAL LUMBAR FOUR- LUMBAR FIVE AND LEFT LUMBAR FIVE- SACRAL ONE LAMINOTOMY, MEDIAL FACETECTOMY AND FORAMINOTOMIES;  Surgeon: Jovita Gamma, MD;  Location: [place];  Service: Neurosurgery;  Laterality: N/A;  . NASAL SEPTUM SURGERY      Current Medications: Current Meds  Medication Sig  . aspirin EC 81 MG tablet Take 1 tablet (81 mg total) by mouth daily. Swallow whole.  . brinzolamide (AZOPT) 1 % ophthalmic suspension Place 1 drop into both eyes 2 (two) times daily.  . calcium citrate-vitamin D  (CITRACAL+D) 315-200 MG-UNIT per tablet Take 1 tablet by mouth 4 (four) times daily.  . cholecalciferol (VITAMIN D) 1000 UNITS tablet Take 2,000 Units by mouth daily.   . Coenzyme Q10 100 MG capsule Take 100 mg by mouth daily.  Marland Kitchen losartan (COZAAR) 50 MG tablet TAKE 1 TABLET BY MOUTH ONCE DAILY FOR BLOOD PRESSURE  . omeprazole (PRILOSEC) 20 MG capsule Take 20 mg by mouth daily.   . rosuvastatin (CRESTOR) 10 MG tablet Take 1 tablet (10 mg total) by mouth daily.     Allergies:   Statins, Allopurinol, and Darvocet [propoxyphene n-acetaminophen]   Social History   Socioeconomic History  . Marital status: Single    Spouse name: Not on file  . Number of children: Not on file  . Years of education: Not on file  . Highest education level: Not on file  Occupational History  . Occupation: Herbalist  Tobacco Use  . Smoking status: Never Smoker  . Smokeless tobacco: Never Used  Vaping Use  . Vaping Use: Never used  Substance and Sexual Activity  . Alcohol use: Yes    Alcohol/week: 0.0 standard drinks    Comment: once a year  . Drug use: No  . Sexual activity: Not on file  Other Topics Concern  . Not on file  Social History Narrative   Single   No children   Never smoked   Alcohol none   Exercise -stretches, hand weights    Social Determinants of Health   Financial Resource Strain:   . Difficulty of Paying Living Expenses: Not on file  Food Insecurity:   . Worried About Charity fundraiser in the Last Year: Not on file  . Ran Out of Food in the Last Year: Not on file  Transportation Needs:   . Lack of Transportation (Medical): Not on file  . Lack of Transportation (Non-Medical): Not on file  Physical Activity:   . Days of Exercise per Week: Not on file  . Minutes of Exercise per Session: Not on file  Stress:   . Feeling of Stress : Not on file  Social Connections:   . Frequency of Communication with Friends and Family: Not on file  . Frequency of Social Gatherings  with Friends and Family: Not on file  . Attends Religious Services: Not on file  . Active Member of Clubs or Organizations: Not on file  . Attends Archivist Meetings: Not on file  . Marital Status: Not on file     Family History: The patient's family history includes Breast cancer in her mother; Heart attack in her mother; Heart disease in her brother; Lupus in her cousin; Pancreatitis in her father.  ROS:   Please see the history of present illness.     All other systems reviewed and are negative.  EKGs/Labs/Other Studies Reviewed:    The following studies were reviewed today:   EKG:  EKG is not ordered today.  The ekg  ordered at prior clinic visit demonstrates sinus rhythm, first-degree AV block, no ST changes  Recent Labs: 09/27/2019: ALT 21; BUN 14; Creat 0.92; Hemoglobin 14.8; Platelets 301; Potassium 4.2; Sodium 137  Recent Lipid Panel    Component Value Date/Time   CHOL 167 09/27/2019 0825   CHOL 189 09/10/2015 0848   TRIG 115 09/27/2019 0825   HDL 37 (L) 09/27/2019 0825   HDL 39 (L) 09/10/2015 0848   CHOLHDL 4.5 09/27/2019 0825   VLDL 36 (H) 08/06/2016 0825   LDLCALC 107 (H) 09/27/2019 0825    Physical Exam:    VS:  BP 106/82   Pulse 90   Ht 5' 5.5" (1.664 m)   Wt 160 lb (72.6 kg)   SpO2 97%   BMI 26.22 kg/m     Wt Readings from Last 3 Encounters:  01/24/20 160 lb (72.6 kg)  10/24/19 166 lb (75.3 kg)  10/01/19 174 lb (78.9 kg)     GEN: Well nourished, well developed in no acute distress HEENT: Normal NECK: No JVD; No carotid bruits LYMPHATICS: No lymphadenopathy CARDIAC: RRR, no murmurs, rubs, gallops RESPIRATORY:  Clear to auscultation without rales, wheezing or rhonchi  ABDOMEN: Soft, non-tender, non-distended MUSCULOSKELETAL:  No edema; No deformity  SKIN: Warm and dry NEUROLOGIC:  Alert and oriented x 3 PSYCHIATRIC:  Normal affect   ASSESSMENT:    1. Chest pain of uncertain etiology   2. Essential hypertension   3.  Hyperlipidemia, unspecified hyperlipidemia type   4. Palpitations   5. Coronary artery disease involving native coronary artery of native heart, unspecified whether angina present    PLAN:    CAD: Calcium score on 8/2/221 was 463 (86 percentile).  She is reporting atypical lower chest/epigastric pain.  Given known CAD, will need to rule out obstructive disease -Lexiscan Myoview -Echocardiogram -Continue rosuvastatin 10 mg daily.  She is having lipid panel checked next week with her PCP, will follow up to ensure at goal LDL less than 70  Palpitations: Zio patch x14 days on 11/21/2019 showed no significant arrhythmias.  T2DM: A1c 6.5 on 09/27/2019.  Hyperlipidemia: On Fenofibrate 145 mg daily.  LDL 107 on 09/27/2019.  Calcium score on 8/2/221 was 463 (86 percentile).  Started on rosuvastatin 10 mg daily.  Recheck lipid panel as above  Hypertension: On losartan 50 mg daily.  Appears controlled  RTC in 3 months   Medication Adjustments/Labs and Tests Ordered: Current medicines are reviewed at length with the patient today.  Concerns regarding medicines are outlined above.  No orders of the defined types were placed in this encounter.  No orders of the defined types were placed in this encounter.   Patient Instructions  Medication Instructions:  Your physician recommends that you continue on your current medications as directed. Please refer to the Current Medication list given to you today  *If you need a refill on your cardiac medications before your next appointment, please call your pharmacy*  Testing/Procedures: Your physician has requested that you have a lexiscan myoview. For further information please visit [email or web address]. Please follow instruction sheet, as given.  Your physician has requested that you have an echocardiogram. Echocardiography is a painless test that uses sound waves to create images of your heart. It provides your doctor with information about the size and  shape of your heart and how well your heart's chambers and valves are working. This procedure takes approximately one hour. There are no restrictions for this procedure.   Follow-Up: At [facility], you and your  health needs are our priority.  As part of our continuing mission to provide you with exceptional heart care, we have created designated Provider Care Teams.  These Care Teams include your primary Cardiologist (physician) and Advanced Practice Providers (APPs -  Physician Assistants and Nurse Practitioners) who all work together to provide you with the care you need, when you need it.  We recommend signing up for the patient portal called "MyChart".  Sign up information is provided on this After Visit Summary.  MyChart is used to connect with patients for Virtual Visits (Telemedicine).  Patients are able to view lab/test results, encounter notes, upcoming appointments, etc.  Non-urgent messages can be sent to your provider as well.   To learn more about what you can do with MyChart, go to [email or web address].    Your next appointment:   3 month(s)  The format for your next appointment:   In Person  Provider:   Oswaldo Milian, MD         Signed, Donato Heinz, MD  01/24/2020 1:48 PM    [facility]

## 2020-01-22 DIAGNOSIS — M5134 Other intervertebral disc degeneration, thoracic region: Secondary | ICD-10-CM | POA: Diagnosis not present

## 2020-01-22 DIAGNOSIS — M5136 Other intervertebral disc degeneration, lumbar region: Secondary | ICD-10-CM | POA: Diagnosis not present

## 2020-01-22 DIAGNOSIS — M9902 Segmental and somatic dysfunction of thoracic region: Secondary | ICD-10-CM | POA: Diagnosis not present

## 2020-01-22 DIAGNOSIS — M9903 Segmental and somatic dysfunction of lumbar region: Secondary | ICD-10-CM | POA: Diagnosis not present

## 2020-01-24 ENCOUNTER — Encounter: Payer: Self-pay | Admitting: Cardiology

## 2020-01-24 ENCOUNTER — Other Ambulatory Visit: Payer: Self-pay

## 2020-01-24 ENCOUNTER — Ambulatory Visit: Payer: Medicare Other | Admitting: Cardiology

## 2020-01-24 VITALS — BP 106/82 | HR 90 | Ht 65.5 in | Wt 160.0 lb

## 2020-01-24 DIAGNOSIS — R002 Palpitations: Secondary | ICD-10-CM

## 2020-01-24 DIAGNOSIS — E785 Hyperlipidemia, unspecified: Secondary | ICD-10-CM | POA: Diagnosis not present

## 2020-01-24 DIAGNOSIS — R079 Chest pain, unspecified: Secondary | ICD-10-CM | POA: Diagnosis not present

## 2020-01-24 DIAGNOSIS — I2510 Atherosclerotic heart disease of native coronary artery without angina pectoris: Secondary | ICD-10-CM | POA: Diagnosis not present

## 2020-01-24 DIAGNOSIS — I10 Essential (primary) hypertension: Secondary | ICD-10-CM | POA: Diagnosis not present

## 2020-01-24 NOTE — Patient Instructions (Signed)
Medication Instructions:  Your physician recommends that you continue on your current medications as directed. Please refer to the Current Medication list given to you today  *If you need a refill on your cardiac medications before your next appointment, please call your pharmacy*  Testing/Procedures: Your physician has requested that you have a lexiscan myoview. For further information please visit [email or web address]. Please follow instruction sheet, as given.  Your physician has requested that you have an echocardiogram. Echocardiography is a painless test that uses sound waves to create images of your heart. It provides your doctor with information about the size and shape of your heart and how well your heart's chambers and valves are working. This procedure takes approximately one hour. There are no restrictions for this procedure.   Follow-Up: At [facility], you and your health needs are our priority.  As part of our continuing mission to provide you with exceptional heart care, we have created designated Provider Care Teams.  These Care Teams include your primary Cardiologist (physician) and Advanced Practice Providers (APPs -  Physician Assistants and Nurse Practitioners) who all work together to provide you with the care you need, when you need it.  We recommend signing up for the patient portal called "MyChart".  Sign up information is provided on this After Visit Summary.  MyChart is used to connect with patients for Virtual Visits (Telemedicine).  Patients are able to view lab/test results, encounter notes, upcoming appointments, etc.  Non-urgent messages can be sent to your provider as well.   To learn more about what you can do with MyChart, go to [email or web address].    Your next appointment:   3 month(s)  The format for your next appointment:   In Person  Provider:   Oswaldo Milian, MD

## 2020-01-29 ENCOUNTER — Other Ambulatory Visit: Payer: Self-pay

## 2020-01-29 ENCOUNTER — Other Ambulatory Visit: Payer: Medicare Other

## 2020-01-29 DIAGNOSIS — E119 Type 2 diabetes mellitus without complications: Secondary | ICD-10-CM

## 2020-01-29 DIAGNOSIS — Z0000 Encounter for general adult medical examination without abnormal findings: Secondary | ICD-10-CM

## 2020-01-30 ENCOUNTER — Telehealth (HOSPITAL_COMMUNITY): Payer: Self-pay | Admitting: *Deleted

## 2020-01-30 ENCOUNTER — Encounter (HOSPITAL_COMMUNITY): Payer: Self-pay | Admitting: *Deleted

## 2020-01-30 LAB — BASIC METABOLIC PANEL WITH GFR
BUN: 10 mg/dL (ref 7–25)
CO2: 28 mmol/L (ref 20–32)
Calcium: 9.8 mg/dL (ref 8.6–10.4)
Chloride: 102 mmol/L (ref 98–110)
Creat: 0.85 mg/dL (ref 0.60–0.93)
GFR, Est African American: 78 mL/min/{1.73_m2} (ref >=60)
GFR, Est Non African American: 67 mL/min/{1.73_m2} (ref >=60)
Glucose, Bld: 115 mg/dL — ABNORMAL HIGH (ref 65–99)
Potassium: 4 mmol/L (ref 3.5–5.3)
Sodium: 137 mmol/L (ref 135–146)

## 2020-01-30 LAB — LIPID PANEL
Cholesterol: 136 mg/dL (ref <200)
HDL: 37 mg/dL — ABNORMAL LOW (ref >=50)
LDL Cholesterol (Calc): 76 mg/dL (calc)
Non-HDL Cholesterol (Calc): 99 mg/dL (calc) (ref <130)
Total CHOL/HDL Ratio: 3.7 (calc) (ref <5.0)
Triglycerides: 155 mg/dL — ABNORMAL HIGH (ref <150)

## 2020-01-30 LAB — HEMOGLOBIN A1C
Hgb A1c MFr Bld: 5.7 % of total Hgb — ABNORMAL HIGH (ref <5.7)
Mean Plasma Glucose: 117 (calc)
eAG (mmol/L): 6.5 (calc)

## 2020-01-30 NOTE — Progress Notes (Signed)
Bad cholesterol and sugar average have both improved. Triglycerides are up a little. Electrolytes and kidneys look good.

## 2020-01-30 NOTE — Telephone Encounter (Signed)
MyChart letter sent outlining instructions for upcoming stress test on 02/04/20@10 :45.

## 2020-01-31 ENCOUNTER — Encounter: Payer: Self-pay | Admitting: Internal Medicine

## 2020-01-31 ENCOUNTER — Ambulatory Visit (INDEPENDENT_AMBULATORY_CARE_PROVIDER_SITE_OTHER): Payer: Medicare Other | Admitting: Internal Medicine

## 2020-01-31 ENCOUNTER — Other Ambulatory Visit: Payer: Self-pay

## 2020-01-31 VITALS — BP 122/82 | HR 83 | Temp 97.5°F | Ht 65.5 in | Wt 157.6 lb

## 2020-01-31 DIAGNOSIS — E119 Type 2 diabetes mellitus without complications: Secondary | ICD-10-CM

## 2020-01-31 DIAGNOSIS — G8929 Other chronic pain: Secondary | ICD-10-CM

## 2020-01-31 DIAGNOSIS — Z23 Encounter for immunization: Secondary | ICD-10-CM | POA: Diagnosis not present

## 2020-01-31 DIAGNOSIS — K581 Irritable bowel syndrome with constipation: Secondary | ICD-10-CM

## 2020-01-31 DIAGNOSIS — I10 Essential (primary) hypertension: Secondary | ICD-10-CM

## 2020-01-31 DIAGNOSIS — M159 Polyosteoarthritis, unspecified: Secondary | ICD-10-CM

## 2020-01-31 DIAGNOSIS — M8580 Other specified disorders of bone density and structure, unspecified site: Secondary | ICD-10-CM

## 2020-01-31 DIAGNOSIS — E782 Mixed hyperlipidemia: Secondary | ICD-10-CM

## 2020-01-31 DIAGNOSIS — M48062 Spinal stenosis, lumbar region with neurogenic claudication: Secondary | ICD-10-CM

## 2020-01-31 DIAGNOSIS — M3501 Sicca syndrome with keratoconjunctivitis: Secondary | ICD-10-CM

## 2020-01-31 DIAGNOSIS — K219 Gastro-esophageal reflux disease without esophagitis: Secondary | ICD-10-CM | POA: Diagnosis not present

## 2020-01-31 DIAGNOSIS — M5441 Lumbago with sciatica, right side: Secondary | ICD-10-CM

## 2020-01-31 MED ORDER — PANTOPRAZOLE SODIUM 40 MG PO TBEC: 40.0000 mg | DELAYED_RELEASE_TABLET | Freq: Every day | ORAL | 0 refills | Status: DC

## 2020-01-31 NOTE — Progress Notes (Signed)
Location:  [facility] clinic Provider:  Laverta Harnisch L. Mariea Clonts, D.O., C.M.D.  Goals of Care:  Advanced Directives 01/31/2020  Does Patient Have a Medical Advance Directive? Yes  Type of Advance Directive [place]  Does patient want to make changes to medical advance directive? No - Patient declined  Copy of [place] in Chart? -  Would patient like information on creating a medical advance directive? -     Chief Complaint  Patient presents with  . Medical Management of Chronic Issues    4 month follow up   . Health Maintenance    influenza, foot exam,   . Acute Visit    flu shot questions, and talk about this heart thing she has going on per patient  words.     HPI: Patient is a 74 y.o. female seen today for medical management of chronic diseases.    She wore a heart monitor two weeks, then had calcium scoring.  Now has echo and stress test this coming Monday.  She's not had flu shot yet.  Had 9/27 covid booster.  Ok to take flu shot.    Back pain comes and goes.  She'd had a bad period but it's been better since she last saw Dr. Teryl Lucy.  She's having more difficulty with heartburn even with prilosec.  Using tums and tagamet.  Might take an aleve rarely for severe back pain but not regularly.  Years ago, she had Rx nexium which worked really well for her heartburn but had abdominal pain.  Has a lot of gas, too.  Has intermittent constipation.  It's unchanged.  She's been on a low carb diet.  She has lost some weight since the summertime.    Had eye exam in June for her glaucoma--was told it was stable.  Has another appt in Dec.    She brought her bone density report from [facility] on 12/20/19--it shows osteopenia with T score -1.8.    Past Medical History:  Diagnosis Date  . Acute upper respiratory infections of unspecified site   . Allergic rhinitis due to pollen   . Benign essential hypertension   . Cervical spondylosis without myelopathy   .  Cervicalgia   . Controlled type 2 diabetes mellitus without complication, without long-term current use of insulin ([place]) 10/01/2019  . Diaphragmatic hernia without mention of obstruction or gangrene   . Disorder of bone and cartilage, unspecified   . Diverticulosis of colon (without mention of hemorrhage)   . Encounter for long-term (current) use of other medications   . GERD (gastroesophageal reflux disease)   . History of hiatal hernia   . Hyperlipidemia LDL goal < 100   . 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 Other and unspecified hyperlipidemia   . Pain in joint, site unspecified   . Pre-diabetes   . Routine general medical examination at a health care facility   . Sjogren's syndrome ([place]) 10/27/2012  . Tear film insufficiency, unspecified   . Unspecified disorder of kidney and ureter   . Unspecified glaucoma(365.9)     Past Surgical History:  Procedure Laterality Date  . BELPHAROPTOSIS REPAIR     brow lift   bil  . CATARACT EXTRACTION W/ INTRAOCULAR LENS  IMPLANT, BILATERAL Bilateral 12/2014   Dr. Prudencio Burly  . [place]   . DILATION AND CURETTAGE OF UTERUS    . LUMBAR LAMINECTOMY/DECOMPRESSION MICRODISCECTOMY N/A 04/21/2017   Procedure: BILATERAL LUMBAR FOUR- LUMBAR  FIVE AND LEFT LUMBAR FIVE- SACRAL ONE LAMINOTOMY, MEDIAL FACETECTOMY AND FORAMINOTOMIES;  Surgeon: Jovita Gamma, MD;  Location: [place];  Service: Neurosurgery;  Laterality: N/A;  . NASAL SEPTUM SURGERY      Allergies  Allergen Reactions  . Statins Other (See Comments)    Muscle problems  . Allopurinol Rash  . Darvocet [Propoxyphene N-Acetaminophen] Rash    Outpatient Encounter Medications as of 01/31/2020  Medication Sig  . aspirin EC 81 MG tablet Take 1 tablet (81 mg total) by mouth daily. Swallow whole.  . brinzolamide (AZOPT) 1 % ophthalmic suspension Place 1 drop into both eyes 2 (two) times daily.  . calcium citrate-vitamin D (CITRACAL+D) 315-200 MG-UNIT per  tablet Take 1 tablet by mouth 4 (four) times daily.  . cholecalciferol (VITAMIN D) 1000 UNITS tablet Take 2,000 Units by mouth daily.   . Coenzyme Q10 100 MG capsule Take 100 mg by mouth daily.  Marland Kitchen losartan (COZAAR) 50 MG tablet TAKE 1 TABLET BY MOUTH ONCE DAILY FOR BLOOD PRESSURE  . omeprazole (PRILOSEC) 20 MG capsule Take 20 mg by mouth daily.   . rosuvastatin (CRESTOR) 10 MG tablet Take 1 tablet (10 mg total) by mouth daily.   No facility-administered encounter medications on file as of 01/31/2020.    Review of Systems:  Review of Systems  Constitutional: Positive for malaise/fatigue. Negative for chills and fever.  HENT: Positive for hearing loss. Negative for congestion and sore throat.   Eyes: Negative for blurred vision.       Glaucoma  Respiratory: Negative for cough and shortness of breath.   Cardiovascular: Negative for chest pain, palpitations and leg swelling.  Gastrointestinal: Positive for heartburn. Negative for abdominal pain, blood in stool, constipation, diarrhea, melena, nausea and vomiting.  Genitourinary: Negative for dysuria.  Musculoskeletal: Positive for back pain and joint pain. Negative for falls.  Skin: Negative for itching and rash.  Neurological: Negative for dizziness, loss of consciousness and weakness.  Psychiatric/Behavioral: Positive for depression. The patient is not nervous/anxious and does not have insomnia.     Health Maintenance  Topic Date Due  . FOOT EXAM  Never done  . INFLUENZA VACCINE  11/11/2019  . Fecal DNA (Cologuard)  02/29/2020  . HEMOGLOBIN A1C  07/29/2020  . OPHTHALMOLOGY EXAM  09/10/2020  . MAMMOGRAM  08/20/2021  . TETANUS/TDAP  05/22/2029  . DEXA SCAN  Completed  . COVID-19 Vaccine  Completed  . Hepatitis C Screening  Completed  . PNA vac Low Risk Adult  Completed    Physical Exam: There were no vitals filed for this visit. There is no height or weight on file to calculate BMI. Physical Exam Vitals reviewed.    Constitutional:      General: She is not in acute distress.    Appearance: Normal appearance. She is not ill-appearing or toxic-appearing.  HENT:     Head: Normocephalic and atraumatic.  Cardiovascular:     Rate and Rhythm: Normal rate and regular rhythm.     Pulses: Normal pulses.     Heart sounds: Normal heart sounds.  Pulmonary:     Effort: Pulmonary effort is normal.     Breath sounds: Normal breath sounds. No wheezing, rhonchi or rales.  Abdominal:     General: Bowel sounds are normal.  Musculoskeletal:        General: Normal range of motion.     Right lower leg: No edema.     Left lower leg: No edema.  Neurological:     General:  No focal deficit present.     Mental Status: She is alert and oriented to person, place, and time.  Psychiatric:        Mood and Affect: Mood normal.        Behavior: Behavior normal.     Labs reviewed: Basic Metabolic Panel: Recent Labs    03/20/19 0815 09/27/19 0825 01/29/20 0815  NA 138 137 137  K 4.0 4.2 4.0  CL 103 104 102  CO2 26 25 28   GLUCOSE 124* 126* 115*  BUN 15 14 10   CREATININE 0.99* 0.92 0.85  CALCIUM 10.1 9.7 9.8   Liver Function Tests: Recent Labs    09/27/19 0825  AST 19  ALT 21  BILITOT 0.6  PROT 6.9   No results for input(s): LIPASE, AMYLASE in the last 8760 hours. No results for input(s): AMMONIA in the last 8760 hours. CBC: Recent Labs    09/27/19 0825  WBC 7.4  NEUTROABS 4,129  HGB 14.8  HCT 43.8  MCV 89.2  PLT 301   Lipid Panel: Recent Labs    03/20/19 0815 09/27/19 0825 01/29/20 0815  CHOL 168 167 136  HDL 37* 37* 37*  LDLCALC 106* 107* 76  TRIG 142 115 155*  CHOLHDL 4.5 4.5 3.7   Lab Results  Component Value Date   HGBA1C 5.7 (H) 01/29/2020    Assessment/Plan 1. Gastroesophageal reflux disease, unspecified whether esophagitis present -not controlled with prilosec -will try protonix, but if not helping, will need further eval like h pylori testing, gi follow-up - pantoprazole  (PROTONIX) 40 MG tablet; Take 1 tablet (40 mg total) by mouth daily before breakfast.  Dispense: 30 tablet; Refill: 0  2. Controlled type 2 diabetes mellitus without complication, without long-term current use of insulin (HCC) - control improved as has cholesterol with low carb diet - Hemoglobin A1c; Future  3. Irritable bowel syndrome with constipation -stable, unchanged  4. Benign essential hypertension - bp at goal with current regimen, cont same, await cardiac workup - BASIC METABOLIC PANEL WITH GFR; Future  5. Chronic right-sided low back pain with right-sided sciatica -stable, continues to see chiropractor  6. Generalized osteoarthritis of multiple sites -did not c/o joints as much today, stable  7. Sjogren's syndrome with keratoconjunctivitis sicca ([place]) -longstanding dry eyes and dry mouth -has eye exams in June and Dec  8. Lumbar stenosis with neurogenic claudication -continues to have challenges here, but helped by chiropractor visits  9. Mixed hyperlipidemia -  Cholesterol improved, TG still elevated - Lipid panel; Future  10. Need for influenza vaccination -high dose flu shot given today  11,  Osteopenia -bone density done at [facility] still in osteopenic range -encouraged weightbearing exercise as tolerated -conts on ca with D and additional D3  Labs/tests ordered:   Lab Orders     Hemoglobin A1c     Lipid panel     BASIC METABOLIC PANEL WITH GFR  Next appt:  4 mos med mgt, fasting labs before   Maddisyn Hegwood L. Dmarion Perfect, D.O. [facility] [address], [place] [address] Cell Phone (Mon-Fri 8am-5pm):  [phone] On Call:  [phone] & follow prompts after 5pm & weekends Office Phone:  [phone] Office Fax:  [phone]

## 2020-02-04 ENCOUNTER — Encounter: Payer: Self-pay | Admitting: Internal Medicine

## 2020-02-04 ENCOUNTER — Ambulatory Visit (HOSPITAL_COMMUNITY): Payer: Medicare Other | Attending: Cardiology

## 2020-02-04 ENCOUNTER — Ambulatory Visit (HOSPITAL_BASED_OUTPATIENT_CLINIC_OR_DEPARTMENT_OTHER): Payer: Medicare Other

## 2020-02-04 ENCOUNTER — Other Ambulatory Visit: Payer: Self-pay

## 2020-02-04 DIAGNOSIS — R079 Chest pain, unspecified: Secondary | ICD-10-CM | POA: Insufficient documentation

## 2020-02-04 LAB — ECHOCARDIOGRAM COMPLETE
Area-P 1/2: 3.68 cm2
S' Lateral: 2.1 cm

## 2020-02-04 LAB — MYOCARDIAL PERFUSION IMAGING
LV dias vol: 49 mL (ref 46–106)
LV sys vol: 12 mL
Peak HR: 101 {beats}/min
Rest HR: 75 {beats}/min
SDS: 0
SRS: 0
SSS: 0
TID: 0.88

## 2020-02-04 MED ORDER — REGADENOSON 0.4 MG/5ML IV SOLN
0.4000 mg | Freq: Once | INTRAVENOUS | Status: AC
  Administered 2020-02-04: 0.4 mg via INTRAVENOUS

## 2020-02-04 MED ORDER — TECHNETIUM TC 99M TETROFOSMIN IV KIT
32.9000 | PACK | Freq: Once | INTRAVENOUS | Status: AC | PRN
  Administered 2020-02-04: 32.9 via INTRAVENOUS
  Filled 2020-02-04: qty 33

## 2020-02-04 MED ORDER — TECHNETIUM TC 99M TETROFOSMIN IV KIT
10.7000 | PACK | Freq: Once | INTRAVENOUS | Status: AC | PRN
  Administered 2020-02-04: 10.7 via INTRAVENOUS
  Filled 2020-02-04: qty 11

## 2020-02-05 DIAGNOSIS — M9903 Segmental and somatic dysfunction of lumbar region: Secondary | ICD-10-CM | POA: Diagnosis not present

## 2020-02-05 DIAGNOSIS — M5134 Other intervertebral disc degeneration, thoracic region: Secondary | ICD-10-CM | POA: Diagnosis not present

## 2020-02-05 DIAGNOSIS — M9902 Segmental and somatic dysfunction of thoracic region: Secondary | ICD-10-CM | POA: Diagnosis not present

## 2020-02-05 DIAGNOSIS — M5136 Other intervertebral disc degeneration, lumbar region: Secondary | ICD-10-CM | POA: Diagnosis not present

## 2020-02-14 ENCOUNTER — Other Ambulatory Visit: Payer: Self-pay | Admitting: Cardiology

## 2020-02-19 DIAGNOSIS — M5134 Other intervertebral disc degeneration, thoracic region: Secondary | ICD-10-CM | POA: Diagnosis not present

## 2020-02-19 DIAGNOSIS — M5136 Other intervertebral disc degeneration, lumbar region: Secondary | ICD-10-CM | POA: Diagnosis not present

## 2020-02-19 DIAGNOSIS — M9902 Segmental and somatic dysfunction of thoracic region: Secondary | ICD-10-CM | POA: Diagnosis not present

## 2020-02-19 DIAGNOSIS — M9903 Segmental and somatic dysfunction of lumbar region: Secondary | ICD-10-CM | POA: Diagnosis not present

## 2020-02-23 ENCOUNTER — Other Ambulatory Visit: Payer: Self-pay | Admitting: Internal Medicine

## 2020-02-23 DIAGNOSIS — K219 Gastro-esophageal reflux disease without esophagitis: Secondary | ICD-10-CM

## 2020-02-25 ENCOUNTER — Other Ambulatory Visit: Payer: Self-pay | Admitting: *Deleted

## 2020-02-25 DIAGNOSIS — K219 Gastro-esophageal reflux disease without esophagitis: Secondary | ICD-10-CM

## 2020-02-25 MED ORDER — PANTOPRAZOLE SODIUM 40 MG PO TBEC: 40.0000 mg | DELAYED_RELEASE_TABLET | Freq: Every day | ORAL | 1 refills | Status: DC

## 2020-02-25 NOTE — Telephone Encounter (Signed)
Patient requested refill to be faxed to [facility]

## 2020-03-04 DIAGNOSIS — M5136 Other intervertebral disc degeneration, lumbar region: Secondary | ICD-10-CM | POA: Diagnosis not present

## 2020-03-04 DIAGNOSIS — M9901 Segmental and somatic dysfunction of cervical region: Secondary | ICD-10-CM | POA: Diagnosis not present

## 2020-03-04 DIAGNOSIS — M5031 Other cervical disc degeneration,  high cervical region: Secondary | ICD-10-CM | POA: Diagnosis not present

## 2020-03-04 DIAGNOSIS — M9903 Segmental and somatic dysfunction of lumbar region: Secondary | ICD-10-CM | POA: Diagnosis not present

## 2020-03-18 DIAGNOSIS — M9903 Segmental and somatic dysfunction of lumbar region: Secondary | ICD-10-CM | POA: Diagnosis not present

## 2020-03-18 DIAGNOSIS — M5136 Other intervertebral disc degeneration, lumbar region: Secondary | ICD-10-CM | POA: Diagnosis not present

## 2020-03-18 DIAGNOSIS — M5031 Other cervical disc degeneration,  high cervical region: Secondary | ICD-10-CM | POA: Diagnosis not present

## 2020-03-18 DIAGNOSIS — M9901 Segmental and somatic dysfunction of cervical region: Secondary | ICD-10-CM | POA: Diagnosis not present

## 2020-03-21 DIAGNOSIS — E119 Type 2 diabetes mellitus without complications: Secondary | ICD-10-CM | POA: Diagnosis not present

## 2020-03-21 DIAGNOSIS — H04123 Dry eye syndrome of bilateral lacrimal glands: Secondary | ICD-10-CM | POA: Diagnosis not present

## 2020-03-21 DIAGNOSIS — H401132 Primary open-angle glaucoma, bilateral, moderate stage: Secondary | ICD-10-CM | POA: Diagnosis not present

## 2020-04-01 DIAGNOSIS — M9901 Segmental and somatic dysfunction of cervical region: Secondary | ICD-10-CM | POA: Diagnosis not present

## 2020-04-01 DIAGNOSIS — M5031 Other cervical disc degeneration,  high cervical region: Secondary | ICD-10-CM | POA: Diagnosis not present

## 2020-04-01 DIAGNOSIS — M5136 Other intervertebral disc degeneration, lumbar region: Secondary | ICD-10-CM | POA: Diagnosis not present

## 2020-04-01 DIAGNOSIS — M9903 Segmental and somatic dysfunction of lumbar region: Secondary | ICD-10-CM | POA: Diagnosis not present

## 2020-04-15 DIAGNOSIS — M5031 Other cervical disc degeneration,  high cervical region: Secondary | ICD-10-CM | POA: Diagnosis not present

## 2020-04-15 DIAGNOSIS — M5136 Other intervertebral disc degeneration, lumbar region: Secondary | ICD-10-CM | POA: Diagnosis not present

## 2020-04-15 DIAGNOSIS — M9903 Segmental and somatic dysfunction of lumbar region: Secondary | ICD-10-CM | POA: Diagnosis not present

## 2020-04-15 DIAGNOSIS — M9901 Segmental and somatic dysfunction of cervical region: Secondary | ICD-10-CM | POA: Diagnosis not present

## 2020-04-24 ENCOUNTER — Other Ambulatory Visit: Payer: Self-pay | Admitting: *Deleted

## 2020-04-24 MED ORDER — LOSARTAN POTASSIUM 50 MG PO TABS: ORAL_TABLET | ORAL | 1 refills | Status: DC

## 2020-04-24 NOTE — Telephone Encounter (Signed)
Patient requested refill. Stated that the mail order she usually use is out of Losartan. Patient is requesting refill to be sent to her local pharmacy.

## 2020-04-25 ENCOUNTER — Ambulatory Visit: Payer: Medicare Other | Admitting: Cardiology

## 2020-04-25 ENCOUNTER — Telehealth: Payer: Self-pay

## 2020-04-25 NOTE — Telephone Encounter (Signed)
Spoke with patient Rescheduled appointment for 05/12/2020 Due to provider out .

## 2020-04-25 NOTE — Progress Notes (Deleted)
Cardiology Office Note:    Date:  04/25/2020  ID:  Felicia Hall, DOB Nov 02, 1945, MRN [id number]  PCP:  Gayland Curry, DO  Cardiologist:  No primary care provider on file.  Electrophysiologist:  None   Referring MD: Gayland Curry, DO   No chief complaint on file.   History of Present Illness:    Felicia Hall is a 75 y.o. female with a hx of T2DM, GERD, hyperlipidemia, hypertension who presents for follow-up.  She was referred by Dr. Mariea Clonts for evaluation of palpitations, sweating on 10/24/2019.   She reports that palpitations started in May 2021.  She was watching TV and had an episode where she felt her heart was racing.  Lasted for few minutes and resolved.  She denies any lightheadedness or syncope during this episode.  Then earlier in July 2021 had an episode where she felt like her heart was fluttering, lasted few seconds and resolved.  She reports that she exercises by riding stationary bicycle every day for 30 minutes.  She denies any chest pain or dyspnea.  She denies any lower extremity edema.  No smoking history.  Rare alcohol use.  She drinks one latte every morning, no other caffeine throughout the day.  Brother had CABG in 42s.  She has had issues with myalgias on statins in the past.    Zio patch x14 days on 11/21/2019 showed no significant arrhythmias.  Calcium score on 8/2/221 was 463 (86 percentile).  Echo 02/04/2020 showed LVEF 70 to 75%, normal RV function, grade 1 diastolic dysfunction, mild MR, recorded as severe pulmonary artery systolic pressure elevation but pulmonary pressures appear normal.   Lexiscan Myoview on 02/04/2020 showed normal perfusion, EF 76%.  Since last clinic visit,   she reports that her palpitations have resolved.  Does states she has been having epigastric/lower chest pain.  Described as pressure, occurs few times per month.  Can occur with eating but also occurs when she has not eaten.  Has not noted relationship with exertion.  She has been doing  stationary bike for 30 minutes/day, denies any exertional symptoms.  Denies any lightheadedness, syncope, or lower extremity edema.  States that she has been taking her statin, is tolerating well.    Past Medical History:  Diagnosis Date  . Acute upper respiratory infections of unspecified site   . Allergic rhinitis due to pollen   . Benign essential hypertension   . Cervical spondylosis without myelopathy   . Cervicalgia   . Controlled type 2 diabetes mellitus without complication, without long-term current use of insulin ([place]) 10/01/2019  . Diaphragmatic hernia without mention of obstruction or gangrene   . Disorder of bone and cartilage, unspecified   . Diverticulosis of colon (without mention of hemorrhage)   . Encounter for long-term (current) use of other medications   . GERD (gastroesophageal reflux disease)   . History of hiatal hernia   . Hyperlipidemia LDL goal < 100   . 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 Other and unspecified hyperlipidemia   . Pain in joint, site unspecified   . Pre-diabetes   . Routine general medical examination at a health care facility   . Sjogren's syndrome ([place]) 10/27/2012  . Tear film insufficiency, unspecified   . Unspecified disorder of kidney and ureter   . Unspecified glaucoma(365.9)     Past Surgical History:  Procedure Laterality Date  . BELPHAROPTOSIS REPAIR     brow lift   bil  . CATARACT  EXTRACTION W/ INTRAOCULAR LENS  IMPLANT, BILATERAL Bilateral 12/2014   Dr. Prudencio Burly  . [place]   . DILATION AND CURETTAGE OF UTERUS    . LUMBAR LAMINECTOMY/DECOMPRESSION MICRODISCECTOMY N/A 04/21/2017   Procedure: BILATERAL LUMBAR FOUR- LUMBAR FIVE AND LEFT LUMBAR FIVE- SACRAL ONE LAMINOTOMY, MEDIAL FACETECTOMY AND FORAMINOTOMIES;  Surgeon: Jovita Gamma, MD;  Location: [place];  Service: Neurosurgery;  Laterality: N/A;  . NASAL SEPTUM SURGERY      Current Medications: No outpatient medications have  been marked as taking for the 04/25/20 encounter (Appointment) with Donato Heinz, MD.     Allergies:   Statins, Allopurinol, and Darvocet [propoxyphene n-acetaminophen]   Social History   Socioeconomic History  . Marital status: Single    Spouse name: Not on file  . Number of children: Not on file  . Years of education: Not on file  . Highest education level: Not on file  Occupational History  . Occupation: Herbalist  Tobacco Use  . Smoking status: Never Smoker  . Smokeless tobacco: Never Used  Vaping Use  . Vaping Use: Never used  Substance and Sexual Activity  . Alcohol use: Yes    Alcohol/week: 0.0 standard drinks    Comment: once a year  . Drug use: No  . Sexual activity: Not on file  Other Topics Concern  . Not on file  Social History Narrative   Single   No children   Never smoked   Alcohol none   Exercise -stretches, hand weights    Social Determinants of Health   Financial Resource Strain: Not on file  Food Insecurity: Not on file  Transportation Needs: Not on file  Physical Activity: Not on file  Stress: Not on file  Social Connections: Not on file     Family History: The patient's family history includes Breast cancer in her mother; Heart attack in her mother; Heart disease in her brother; Lupus in her cousin; Pancreatitis in her father.  ROS:   Please see the history of present illness.     All other systems reviewed and are negative.  EKGs/Labs/Other Studies Reviewed:    The following studies were reviewed today:   EKG:  EKG is not ordered today.  The ekg ordered at prior clinic visit demonstrates sinus rhythm, first-degree AV block, no ST changes  Recent Labs: 09/27/2019: ALT 21; Hemoglobin 14.8; Platelets 301 01/29/2020: BUN 10; Creat 0.85; Potassium 4.0; Sodium 137  Recent Lipid Panel    Component Value Date/Time   CHOL 136 01/29/2020 0815   CHOL 189 09/10/2015 0848   TRIG 155 (H) 01/29/2020 0815   HDL 37 (L) 01/29/2020  0815   HDL 39 (L) 09/10/2015 0848   CHOLHDL 3.7 01/29/2020 0815   VLDL 36 (H) 08/06/2016 0825   LDLCALC 76 01/29/2020 0815    Physical Exam:    VS:  There were no vitals taken for this visit.    Wt Readings from Last 3 Encounters:  02/04/20 160 lb (72.6 kg)  01/31/20 157 lb 9.6 oz (71.5 kg)  01/24/20 160 lb (72.6 kg)     GEN: Well nourished, well developed in no acute distress HEENT: Normal NECK: No JVD; No carotid bruits LYMPHATICS: No lymphadenopathy CARDIAC: RRR, no murmurs, rubs, gallops RESPIRATORY:  Clear to auscultation without rales, wheezing or rhonchi  ABDOMEN: Soft, non-tender, non-distended MUSCULOSKELETAL:  No edema; No deformity  SKIN: Warm and dry NEUROLOGIC:  Alert and oriented x 3 PSYCHIATRIC:  Normal affect  ASSESSMENT:    No diagnosis found. PLAN:    CAD: Calcium score on 8/2/221 was 463 (86 percentile).  She is reporting atypical lower chest/epigastric pain.  Echo 02/04/2020 showed LVEF 70 to 75%, normal RV function, grade 1 diastolic dysfunction, mild MR, recorded as severe pulmonary artery systolic pressure elevation but pulmonary pressures appear normal.  Lexiscan Myoview on 02/04/2020 showed normal perfusion, EF 76%. -LDL 76 on 01/29/2020, recommend increase rosuvastatin to 20 mg daily  Palpitations: Zio patch x14 days on 11/21/2019 showed no significant arrhythmias.  T2DM: A1c 6.5 on 09/27/2019.  Hyperlipidemia: On Fenofibrate 145 mg daily.  LDL 76 on 01/29/2020.  Calcium score on 8/2/221 was 463 (86 percentile).  On rosuvastatin 10 mg daily, will increase to 20 mg daily as above  Hypertension: On losartan 50 mg daily.  Appears controlled  RTC in ***   Medication Adjustments/Labs and Tests Ordered: Current medicines are reviewed at length with the patient today.  Concerns regarding medicines are outlined above.  No orders of the defined types were placed in this encounter.  No orders of the defined types were placed in this  encounter.   There are no Patient Instructions on file for this visit.   Signed, Donato Heinz, MD  04/25/2020 6:48 AM    [place]

## 2020-04-30 DIAGNOSIS — M9901 Segmental and somatic dysfunction of cervical region: Secondary | ICD-10-CM | POA: Diagnosis not present

## 2020-04-30 DIAGNOSIS — M5136 Other intervertebral disc degeneration, lumbar region: Secondary | ICD-10-CM | POA: Diagnosis not present

## 2020-04-30 DIAGNOSIS — M5031 Other cervical disc degeneration,  high cervical region: Secondary | ICD-10-CM | POA: Diagnosis not present

## 2020-04-30 DIAGNOSIS — M9903 Segmental and somatic dysfunction of lumbar region: Secondary | ICD-10-CM | POA: Diagnosis not present

## 2020-05-08 DIAGNOSIS — M9902 Segmental and somatic dysfunction of thoracic region: Secondary | ICD-10-CM | POA: Diagnosis not present

## 2020-05-08 DIAGNOSIS — M9903 Segmental and somatic dysfunction of lumbar region: Secondary | ICD-10-CM | POA: Diagnosis not present

## 2020-05-08 DIAGNOSIS — M5134 Other intervertebral disc degeneration, thoracic region: Secondary | ICD-10-CM | POA: Diagnosis not present

## 2020-05-08 DIAGNOSIS — M5136 Other intervertebral disc degeneration, lumbar region: Secondary | ICD-10-CM | POA: Diagnosis not present

## 2020-05-11 NOTE — Progress Notes (Signed)
Cardiology Office Note:    Date:  05/12/2020  ID:  Felicia Hall, DOB June 30, 1945, MRN [id number]  PCP:  Gayland Curry, DO  Cardiologist:  No primary care provider on file.  Electrophysiologist:  None   Referring MD: Gayland Curry, DO   Chief Complaint  Patient presents with  . Palpitations    History of Present Illness:    Felicia Hall is a 75 y.o. female with a hx of T2DM, GERD, hyperlipidemia, hypertension who presents for follow-up.  She was referred by Dr. Mariea Clonts for evaluation of palpitations, sweating on 10/24/2019.   She reports that palpitations started in May 2021.  She was watching TV and had an episode where she felt her heart was racing.  Lasted for few minutes and resolved.  She denies any lightheadedness or syncope during this episode.  Then earlier in July 2021 had an episode where she felt like her heart was fluttering, lasted few seconds and resolved.  She reports that she exercises by riding stationary bicycle every day for 30 minutes.  She denies any chest pain or dyspnea.  She denies any lower extremity edema.  No smoking history.  Rare alcohol use.  She drinks one latte every morning, no other caffeine throughout the day.  Brother had CABG in 21s.  She has had issues with myalgias on statins in the past.    Zio patch x14 days on 11/21/2019 showed no significant arrhythmias.  Calcium score on 8/2/221 was 463 (86 percentile).  Echo 02/04/2020 showed LVEF 70 to 75%, normal RV function, grade 1 diastolic dysfunction, mild MR, recorded as severe pulmonary artery systolic pressure elevation but pulmonary pressures appear normal.   Lexiscan Myoview on 02/04/2020 showed normal perfusion, EF 76%.  Since last clinic visit, she reports that she is doing well.  States that her chest/epigastric pain has improved.  She changed medications for her GERD and symptoms improved.  She denies any dyspnea, lightheadedness, syncope, or lower extremity edema.  States that had an episode last  week where she woke up during the night and heart was racing, but only lasted for short time.  She continues to exercise by doing 30 minutes on stationary bike every day, denies any exertional symptoms.    Past Medical History:  Diagnosis Date  . Acute upper respiratory infections of unspecified site   . Allergic rhinitis due to pollen   . Benign essential hypertension   . Cervical spondylosis without myelopathy   . Cervicalgia   . Controlled type 2 diabetes mellitus without complication, without long-term current use of insulin ([place]) 10/01/2019  . Diaphragmatic hernia without mention of obstruction or gangrene   . Disorder of bone and cartilage, unspecified   . Diverticulosis of colon (without mention of hemorrhage)   . Encounter for long-term (current) use of other medications   . GERD (gastroesophageal reflux disease)   . History of hiatal hernia   . Hyperlipidemia LDL goal < 100   . 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 Other and unspecified hyperlipidemia   . Pain in joint, site unspecified   . Pre-diabetes   . Routine general medical examination at a health care facility   . Sjogren's syndrome ([place]) 10/27/2012  . Tear film insufficiency, unspecified   . Unspecified disorder of kidney and ureter   . Unspecified glaucoma(365.9)     Past Surgical History:  Procedure Laterality Date  . BELPHAROPTOSIS REPAIR     brow lift   bil  .  CATARACT EXTRACTION W/ INTRAOCULAR LENS  IMPLANT, BILATERAL Bilateral 12/2014   Dr. Prudencio Burly  . [place]   . DILATION AND CURETTAGE OF UTERUS    . LUMBAR LAMINECTOMY/DECOMPRESSION MICRODISCECTOMY N/A 04/21/2017   Procedure: BILATERAL LUMBAR FOUR- LUMBAR FIVE AND LEFT LUMBAR FIVE- SACRAL ONE LAMINOTOMY, MEDIAL FACETECTOMY AND FORAMINOTOMIES;  Surgeon: Jovita Gamma, MD;  Location: [facility];  Service: Neurosurgery;  Laterality: N/A;  . NASAL SEPTUM SURGERY      Current Medications: Current Meds  Medication  Sig  . aspirin EC 81 MG tablet Take 1 tablet (81 mg total) by mouth daily. Swallow whole.  . brinzolamide (AZOPT) 1 % ophthalmic suspension Place 1 drop into both eyes 2 (two) times daily.  . calcium citrate-vitamin D (CITRACAL+D) 315-200 MG-UNIT per tablet Take 1 tablet by mouth 4 (four) times daily.  . cholecalciferol (VITAMIN D) 1000 UNITS tablet Take 2,000 Units by mouth daily.   Marland Kitchen losartan (COZAAR) 50 MG tablet Take one tablet by mouth once daily for blood pressure  . pantoprazole (PROTONIX) 40 MG tablet Take 1 tablet (40 mg total) by mouth daily before breakfast.  . [DISCONTINUED] rosuvastatin (CRESTOR) 10 MG tablet TAKE 1 TABLET BY MOUTH EVERY DAY     Allergies:   Statins, Allopurinol, and Darvocet [propoxyphene n-acetaminophen]   Social History   Socioeconomic History  . Marital status: Single    Spouse name: Not on file  . Number of children: Not on file  . Years of education: Not on file  . Highest education level: Not on file  Occupational History  . Occupation: Herbalist  Tobacco Use  . Smoking status: Never Smoker  . Smokeless tobacco: Never Used  Vaping Use  . Vaping Use: Never used  Substance and Sexual Activity  . Alcohol use: Yes    Alcohol/week: 0.0 standard drinks    Comment: once a year  . Drug use: No  . Sexual activity: Not on file  Other Topics Concern  . Not on file  Social History Narrative   Single   No children   Never smoked   Alcohol none   Exercise -stretches, hand weights    Social Determinants of Health   Financial Resource Strain: Not on file  Food Insecurity: Not on file  Transportation Needs: Not on file  Physical Activity: Not on file  Stress: Not on file  Social Connections: Not on file     Family History: The patient's family history includes Breast cancer in her mother; Heart attack in her mother; Heart disease in her brother; Lupus in her cousin; Pancreatitis in her father.  ROS:   Please see the history of present  illness.     All other systems reviewed and are negative.  EKGs/Labs/Other Studies Reviewed:    The following studies were reviewed today:   EKG:  EKG is not ordered today.  The ekg ordered at prior clinic visit demonstrates sinus rhythm, first-degree AV block, no ST changes  Recent Labs: 09/27/2019: ALT 21; Hemoglobin 14.8; Platelets 301 01/29/2020: BUN 10; Creat 0.85; Potassium 4.0; Sodium 137  Recent Lipid Panel    Component Value Date/Time   CHOL 136 01/29/2020 0815   CHOL 189 09/10/2015 0848   TRIG 155 (H) 01/29/2020 0815   HDL 37 (L) 01/29/2020 0815   HDL 39 (L) 09/10/2015 0848   CHOLHDL 3.7 01/29/2020 0815   VLDL 36 (H) 08/06/2016 0825   LDLCALC 76 01/29/2020 0815    Physical Exam:  VS:  BP 126/82   Pulse 64   Ht 5' 5.5" (1.664 m)   Wt 157 lb 9.6 oz (71.5 kg)   BMI 25.83 kg/m     Wt Readings from Last 3 Encounters:  05/12/20 157 lb 9.6 oz (71.5 kg)  02/04/20 160 lb (72.6 kg)  01/31/20 157 lb 9.6 oz (71.5 kg)     GEN: Well nourished, well developed in no acute distress HEENT: Normal NECK: No JVD; No carotid bruits LYMPHATICS: No lymphadenopathy CARDIAC: RRR, no murmurs, rubs, gallops RESPIRATORY:  Clear to auscultation without rales, wheezing or rhonchi  ABDOMEN: Soft, non-tender, non-distended MUSCULOSKELETAL:  No edema; No deformity  SKIN: Warm and dry NEUROLOGIC:  Alert and oriented x 3 PSYCHIATRIC:  Normal affect   ASSESSMENT:    1. Coronary artery disease involving native coronary artery of native heart, unspecified whether angina present   2. Palpitations   3. Hyperlipidemia, unspecified hyperlipidemia type   4. Essential hypertension    PLAN:    CAD: Calcium score on 8/2/221 was 463 (86 percentile).  She reported atypical lower chest/epigastric pain.  Echo 02/04/2020 showed LVEF 70 to 75%, normal RV function, grade 1 diastolic dysfunction, mild MR, recorded as severe pulmonary artery systolic pressure elevation but pulmonary pressures appear  normal.  Lexiscan Myoview on 02/04/2020 showed normal perfusion, EF 76%. -LDL 76 on 01/29/2020, recommend increase rosuvastatin to 20 mg daily  Palpitations: Zio patch x14 days on 11/21/2019 showed no significant arrhythmias.  T2DM: A1c 6.5 on 09/27/2019.  Hyperlipidemia: On Fenofibrate 145 mg daily.  LDL 76 on 01/29/2020.  Calcium score on 8/2/221 was 463 (86 percentile).  On rosuvastatin 10 mg daily, will increase to 20 mg daily as above.  Recheck lipid panel in 2 to 3 months  Hypertension: On losartan 50 mg daily.  Appears controlled  RTC in 1 year   Medication Adjustments/Labs and Tests Ordered: Current medicines are reviewed at length with the patient today.  Concerns regarding medicines are outlined above.  Orders Placed This Encounter  Procedures  . Lipid panel   Meds ordered this encounter  Medications  . rosuvastatin (CRESTOR) 20 MG tablet    Sig: Take 1 tablet (20 mg total) by mouth daily.    Dispense:  90 tablet    Refill:  3    Dose increase    Patient Instructions  Medication Instructions:  INCREASE rosuvastatin (Crestor) to 20 mg daily  *If you need a refill on your cardiac medications before your next appointment, please call your pharmacy*   Lab Work: Please return for FASTING labs in 2-3 month (Lipid)-lab slips will be mailed closer to date as a reminder  Our in office lab hours are Monday-Friday 8:00-4:00, closed for lunch 12:45-1:45 pm.  No appointment needed.  Follow-Up: At [facility], you and your health needs are our priority.  As part of our continuing mission to provide you with exceptional heart care, we have created designated Provider Care Teams.  These Care Teams include your primary Cardiologist (physician) and Advanced Practice Providers (APPs -  Physician Assistants and Nurse Practitioners) who all work together to provide you with the care you need, when you need it.  We recommend signing up for the patient portal called "MyChart".  Sign  up information is provided on this After Visit Summary.  MyChart is used to connect with patients for Virtual Visits (Telemedicine).  Patients are able to view lab/test results, encounter notes, upcoming appointments, etc.  Non-urgent messages can be sent to your  provider as well.   To learn more about what you can do with MyChart, go to [email or web address].    Your next appointment:   12 month(s)  The format for your next appointment:   In Person  Provider:   Oswaldo Milian, MD        Signed, Donato Heinz, MD  05/12/2020 5:51 PM    [place]

## 2020-05-12 ENCOUNTER — Other Ambulatory Visit: Payer: Self-pay

## 2020-05-12 ENCOUNTER — Ambulatory Visit: Payer: Medicare Other | Admitting: Cardiology

## 2020-05-12 ENCOUNTER — Encounter: Payer: Self-pay | Admitting: Cardiology

## 2020-05-12 VITALS — BP 126/82 | HR 64 | Ht 65.5 in | Wt 157.6 lb

## 2020-05-12 DIAGNOSIS — I2510 Atherosclerotic heart disease of native coronary artery without angina pectoris: Secondary | ICD-10-CM

## 2020-05-12 DIAGNOSIS — I10 Essential (primary) hypertension: Secondary | ICD-10-CM | POA: Diagnosis not present

## 2020-05-12 DIAGNOSIS — E785 Hyperlipidemia, unspecified: Secondary | ICD-10-CM | POA: Diagnosis not present

## 2020-05-12 DIAGNOSIS — R002 Palpitations: Secondary | ICD-10-CM

## 2020-05-12 MED ORDER — ROSUVASTATIN CALCIUM 20 MG PO TABS: 20.0000 mg | ORAL_TABLET | Freq: Every day | ORAL | 3 refills | Status: DC

## 2020-05-12 NOTE — Patient Instructions (Signed)
Medication Instructions:  INCREASE rosuvastatin (Crestor) to 20 mg daily  *If you need a refill on your cardiac medications before your next appointment, please call your pharmacy*   Lab Work: Please return for FASTING labs in 2-3 month (Lipid)-lab slips will be mailed closer to date as a reminder  Our in office lab hours are Monday-Friday 8:00-4:00, closed for lunch 12:45-1:45 pm.  No appointment needed.  Follow-Up: At [facility], you and your health needs are our priority.  As part of our continuing mission to provide you with exceptional heart care, we have created designated Provider Care Teams.  These Care Teams include your primary Cardiologist (physician) and Advanced Practice Providers (APPs -  Physician Assistants and Nurse Practitioners) who all work together to provide you with the care you need, when you need it.  We recommend signing up for the patient portal called "MyChart".  Sign up information is provided on this After Visit Summary.  MyChart is used to connect with patients for Virtual Visits (Telemedicine).  Patients are able to view lab/test results, encounter notes, upcoming appointments, etc.  Non-urgent messages can be sent to your provider as well.   To learn more about what you can do with MyChart, go to [email or web address].    Your next appointment:   12 month(s)  The format for your next appointment:   In Person  Provider:   Oswaldo Milian, MD

## 2020-05-20 DIAGNOSIS — M5134 Other intervertebral disc degeneration, thoracic region: Secondary | ICD-10-CM | POA: Diagnosis not present

## 2020-05-20 DIAGNOSIS — M9903 Segmental and somatic dysfunction of lumbar region: Secondary | ICD-10-CM | POA: Diagnosis not present

## 2020-05-20 DIAGNOSIS — M9902 Segmental and somatic dysfunction of thoracic region: Secondary | ICD-10-CM | POA: Diagnosis not present

## 2020-05-20 DIAGNOSIS — M5136 Other intervertebral disc degeneration, lumbar region: Secondary | ICD-10-CM | POA: Diagnosis not present

## 2020-05-28 DIAGNOSIS — M5136 Other intervertebral disc degeneration, lumbar region: Secondary | ICD-10-CM | POA: Diagnosis not present

## 2020-05-28 DIAGNOSIS — M5134 Other intervertebral disc degeneration, thoracic region: Secondary | ICD-10-CM | POA: Diagnosis not present

## 2020-05-28 DIAGNOSIS — M9903 Segmental and somatic dysfunction of lumbar region: Secondary | ICD-10-CM | POA: Diagnosis not present

## 2020-05-28 DIAGNOSIS — M9902 Segmental and somatic dysfunction of thoracic region: Secondary | ICD-10-CM | POA: Diagnosis not present

## 2020-06-02 ENCOUNTER — Encounter: Payer: Self-pay | Admitting: Internal Medicine

## 2020-06-09 ENCOUNTER — Other Ambulatory Visit: Payer: Self-pay

## 2020-06-09 ENCOUNTER — Other Ambulatory Visit: Payer: Medicare Other

## 2020-06-09 DIAGNOSIS — I10 Essential (primary) hypertension: Secondary | ICD-10-CM | POA: Diagnosis not present

## 2020-06-09 DIAGNOSIS — E782 Mixed hyperlipidemia: Secondary | ICD-10-CM

## 2020-06-09 DIAGNOSIS — M9903 Segmental and somatic dysfunction of lumbar region: Secondary | ICD-10-CM | POA: Diagnosis not present

## 2020-06-09 DIAGNOSIS — E119 Type 2 diabetes mellitus without complications: Secondary | ICD-10-CM

## 2020-06-09 DIAGNOSIS — M9902 Segmental and somatic dysfunction of thoracic region: Secondary | ICD-10-CM | POA: Diagnosis not present

## 2020-06-09 DIAGNOSIS — M5136 Other intervertebral disc degeneration, lumbar region: Secondary | ICD-10-CM | POA: Diagnosis not present

## 2020-06-09 DIAGNOSIS — M5134 Other intervertebral disc degeneration, thoracic region: Secondary | ICD-10-CM | POA: Diagnosis not present

## 2020-06-10 LAB — BASIC METABOLIC PANEL WITH GFR
BUN: 13 mg/dL (ref 7–25)
CO2: 27 mmol/L (ref 20–32)
Calcium: 9.5 mg/dL (ref 8.6–10.4)
Chloride: 100 mmol/L (ref 98–110)
Creat: 0.72 mg/dL (ref 0.60–0.93)
GFR, Est African American: 96 mL/min/{1.73_m2} (ref >=60)
GFR, Est Non African American: 82 mL/min/{1.73_m2} (ref >=60)
Glucose, Bld: 101 mg/dL — ABNORMAL HIGH (ref 65–99)
Potassium: 4.2 mmol/L (ref 3.5–5.3)
Sodium: 136 mmol/L (ref 135–146)

## 2020-06-10 LAB — HEMOGLOBIN A1C
Hgb A1c MFr Bld: 6 % of total Hgb — ABNORMAL HIGH (ref <5.7)
Mean Plasma Glucose: 126 mg/dL
eAG (mmol/L): 7 mmol/L

## 2020-06-10 LAB — LIPID PANEL
Cholesterol: 140 mg/dL (ref <200)
HDL: 49 mg/dL — ABNORMAL LOW (ref >=50)
LDL Cholesterol (Calc): 69 mg/dL (calc)
Non-HDL Cholesterol (Calc): 91 mg/dL (calc) (ref <130)
Total CHOL/HDL Ratio: 2.9 (calc) (ref <5.0)
Triglycerides: 132 mg/dL (ref <150)

## 2020-06-10 NOTE — Progress Notes (Signed)
Bad cholesterol is at goal now, but sugar average trended up.  We'll review at her visit.

## 2020-06-12 ENCOUNTER — Ambulatory Visit (INDEPENDENT_AMBULATORY_CARE_PROVIDER_SITE_OTHER): Payer: Medicare Other | Admitting: Internal Medicine

## 2020-06-12 ENCOUNTER — Encounter: Payer: Self-pay | Admitting: Internal Medicine

## 2020-06-12 ENCOUNTER — Other Ambulatory Visit: Payer: Self-pay

## 2020-06-12 VITALS — BP 126/74 | HR 82 | Temp 96.9°F | Ht 66.0 in | Wt 161.0 lb

## 2020-06-12 DIAGNOSIS — I2510 Atherosclerotic heart disease of native coronary artery without angina pectoris: Secondary | ICD-10-CM

## 2020-06-12 DIAGNOSIS — I10 Essential (primary) hypertension: Secondary | ICD-10-CM

## 2020-06-12 DIAGNOSIS — M3501 Sicca syndrome with keratoconjunctivitis: Secondary | ICD-10-CM | POA: Diagnosis not present

## 2020-06-12 DIAGNOSIS — M9903 Segmental and somatic dysfunction of lumbar region: Secondary | ICD-10-CM | POA: Diagnosis not present

## 2020-06-12 DIAGNOSIS — M5136 Other intervertebral disc degeneration, lumbar region: Secondary | ICD-10-CM | POA: Diagnosis not present

## 2020-06-12 DIAGNOSIS — E119 Type 2 diabetes mellitus without complications: Secondary | ICD-10-CM | POA: Diagnosis not present

## 2020-06-12 DIAGNOSIS — M48062 Spinal stenosis, lumbar region with neurogenic claudication: Secondary | ICD-10-CM

## 2020-06-12 DIAGNOSIS — M9902 Segmental and somatic dysfunction of thoracic region: Secondary | ICD-10-CM | POA: Diagnosis not present

## 2020-06-12 DIAGNOSIS — E782 Mixed hyperlipidemia: Secondary | ICD-10-CM | POA: Diagnosis not present

## 2020-06-12 DIAGNOSIS — Z1211 Encounter for screening for malignant neoplasm of colon: Secondary | ICD-10-CM | POA: Diagnosis not present

## 2020-06-12 DIAGNOSIS — M5134 Other intervertebral disc degeneration, thoracic region: Secondary | ICD-10-CM | POA: Diagnosis not present

## 2020-06-12 NOTE — Progress Notes (Signed)
Location:  [facility] clinic Provider:  Misbah Hornaday L. Mariea Clonts, D.O., C.M.D.  Code Status: DNR Goals of Care:  Advanced Directives 06/12/2020  Does Patient Have a Medical Advance Directive? Yes  Type of Paramedic of [place];Living will  Does patient want to make changes to medical advance directive? No - Patient declined  Copy of [place] in Chart? Yes - validated most recent copy scanned in chart (See row information)  Would patient like information on creating a medical advance directive? -     Chief Complaint  Patient presents with  . Medical Management of Chronic Issues    4 month follow-up and discuss lab. Discuss cologuard recommendation.      HPI: Patient is a 75 y.o. female seen today for medical management of chronic diseases.    She says she's hungry all of the time and eats more than she should.  Says with her sjogrer's she is slightly dehydrated all of the time.  She has increased her exercise just a little bit.  She had been riding her stationary bike 30 mins and went up to 35 mins now and wants to go to 40 mins.    Cholesterol did get better with higher dose of crestor.after cardiology appt.  She's having a bit more leg pain, but is unsure if related to the med or simply advancing age.  No weakness problem this time.    Saw Dr. Prudencio Burly and she had no diabetic retinopathy.  Has seen Dr. Teryl Lucy for chiropractor.  She was told that the pulmonary htn was not accurate.  Had grade 1 diastolic dysfunction. Past Medical History:  Diagnosis Date  . Acute upper respiratory infections of unspecified site   . Allergic rhinitis due to pollen   . Benign essential hypertension   . Cervical spondylosis without myelopathy   . Cervicalgia   . Controlled type 2 diabetes mellitus without complication, without long-term current use of insulin ([place]) 10/01/2019  . Diaphragmatic hernia without mention of obstruction or gangrene   . Disorder of bone and  cartilage, unspecified   . Diverticulosis of colon (without mention of hemorrhage)   . Encounter for long-term (current) use of other medications   . GERD (gastroesophageal reflux disease)   . History of hiatal hernia   . Hyperlipidemia LDL goal < 100   . 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 Other and unspecified hyperlipidemia   . Pain in joint, site unspecified   . Pre-diabetes   . Routine general medical examination at a health care facility   . Sjogren's syndrome ([place]) 10/27/2012  . Tear film insufficiency, unspecified   . Unspecified disorder of kidney and ureter   . Unspecified glaucoma(365.9)     Past Surgical History:  Procedure Laterality Date  . BELPHAROPTOSIS REPAIR     brow lift   bil  . CATARACT EXTRACTION W/ INTRAOCULAR LENS  IMPLANT, BILATERAL Bilateral 12/2014   Dr. Prudencio Burly  . [place]   . DILATION AND CURETTAGE OF UTERUS    . LUMBAR LAMINECTOMY/DECOMPRESSION MICRODISCECTOMY N/A 04/21/2017   Procedure: BILATERAL LUMBAR FOUR- LUMBAR FIVE AND LEFT LUMBAR FIVE- SACRAL ONE LAMINOTOMY, MEDIAL FACETECTOMY AND FORAMINOTOMIES;  Surgeon: Jovita Gamma, MD;  Location: [facility];  Service: Neurosurgery;  Laterality: N/A;  . NASAL SEPTUM SURGERY      Allergies  Allergen Reactions  . Statins Other (See Comments)    Muscle problems  . Allopurinol Rash  . Darvocet [Propoxyphene N-Acetaminophen]  Rash    Outpatient Encounter Medications as of 06/12/2020  Medication Sig  . aspirin EC 81 MG tablet Take 1 tablet (81 mg total) by mouth daily. Swallow whole.  . brinzolamide (AZOPT) 1 % ophthalmic suspension Place 1 drop into both eyes 2 (two) times daily.  . calcium citrate-vitamin D (CITRACAL+D) 315-200 MG-UNIT per tablet Take 1 tablet by mouth 4 (four) times daily.  . cholecalciferol (VITAMIN D) 1000 UNITS tablet Take 2,000 Units by mouth daily.   Marland Kitchen losartan (COZAAR) 50 MG tablet Take one tablet by mouth once daily for blood pressure   . pantoprazole (PROTONIX) 40 MG tablet Take 1 tablet (40 mg total) by mouth daily before breakfast.  . rosuvastatin (CRESTOR) 20 MG tablet Take 1 tablet (20 mg total) by mouth daily.   No facility-administered encounter medications on file as of 06/12/2020.    Review of Systems:  Review of Systems  Constitutional: Negative for chills, fever and malaise/fatigue.  HENT: Negative for congestion and sore throat.   Eyes: Negative for blurred vision.       Chronic dry eyes  Respiratory: Negative for cough and shortness of breath.   Cardiovascular: Negative for chest pain, palpitations and leg swelling.  Gastrointestinal: Negative for abdominal pain, blood in stool, diarrhea and melena.  Genitourinary: Negative for dysuria.  Musculoskeletal: Positive for back pain, joint pain and neck pain. Negative for falls.  Neurological: Negative for dizziness and loss of consciousness.  Endo/Heme/Allergies: Does not bruise/bleed easily.  Psychiatric/Behavioral: Negative for depression and memory loss. The patient is not nervous/anxious and does not have insomnia.     Health Maintenance  Topic Date Due  . Fecal DNA (Cologuard)  02/29/2020  . OPHTHALMOLOGY EXAM  09/10/2020  . HEMOGLOBIN A1C  12/07/2020  . FOOT EXAM  01/30/2021  . MAMMOGRAM  08/20/2021  . TETANUS/TDAP  05/22/2029  . INFLUENZA VACCINE  Completed  . DEXA SCAN  Completed  . COVID-19 Vaccine  Completed  . Hepatitis C Screening  Completed  . PNA vac Low Risk Adult  Completed  . HPV VACCINES  Aged Out    Physical Exam: Vitals:   06/12/20 1020  BP: 126/74  Pulse: 82  Temp: (!) 96.9 F (36.1 C)  TempSrc: Temporal  SpO2: 99%  Weight: 161 lb (73 kg)  Height: 5\' 6"  (1.676 m)   Body mass index is 25.99 kg/m. Physical Exam Vitals reviewed.  Constitutional:      General: She is not in acute distress.    Appearance: Normal appearance. She is not toxic-appearing.  HENT:     Head: Normocephalic and atraumatic.  Cardiovascular:      Rate and Rhythm: Normal rate and regular rhythm.     Pulses: Normal pulses.     Heart sounds: Normal heart sounds.  Pulmonary:     Effort: Pulmonary effort is normal.     Breath sounds: Normal breath sounds. No wheezing, rhonchi or rales.  Musculoskeletal:        General: Normal range of motion.     Cervical back: Neck supple.     Right lower leg: No edema.     Left lower leg: No edema.  Neurological:     General: No focal deficit present.     Mental Status: She is alert and oriented to person, place, and time.  Psychiatric:        Mood and Affect: Mood normal.        Behavior: Behavior normal.    Labs reviewed: Basic Metabolic Panel:  Recent Labs    09/27/19 0825 01/29/20 0815 06/09/20 0828  NA 137 137 136  K 4.2 4.0 4.2  CL 104 102 100  CO2 25 28 27   GLUCOSE 126* 115* 101*  BUN 14 10 13   CREATININE 0.92 0.85 0.72  CALCIUM 9.7 9.8 9.5   Liver Function Tests: Recent Labs    09/27/19 0825  AST 19  ALT 21  BILITOT 0.6  PROT 6.9   No results for input(s): LIPASE, AMYLASE in the last 8760 hours. No results for input(s): AMMONIA in the last 8760 hours. CBC: Recent Labs    09/27/19 0825  WBC 7.4  NEUTROABS 4,129  HGB 14.8  HCT 43.8  MCV 89.2  PLT 301   Lipid Panel: Recent Labs    09/27/19 0825 01/29/20 0815 06/09/20 0828  CHOL 167 136 140  HDL 37* 37* 49*  LDLCALC 107* 76 69  TRIG 115 155* 132  CHOLHDL 4.5 3.7 2.9   Lab Results  Component Value Date   HGBA1C 6.0 (H) 06/09/2020    Assessment/Plan 1. Controlled type 2 diabetes mellitus without complication, without long-term current use of insulin ([place]) - remains in prediabetic range, but has trended up so she's going to work on this - Basic metabolic panel - Hemoglobin A1c - Hemoglobin A1c; Future - COMPLETE METABOLIC PANEL WITH GFR; Future - CBC with Differential/Platelet; Future  2. Sjogren's syndrome with keratoconjunctivitis sicca (HCC) Cont biotene products, hydration  3. Lumbar  stenosis with neurogenic claudication -back pain stable, follows with chiropractor for therapeutic modalities  4. Benign essential hypertension -bp at goal with current regimen, cont same and monitor - Basic metabolic panel  5. Mixed hyperlipidemia - cont current crestor--appears to be tolerating - Lipid panel  6. Coronary artery disease involving native coronary artery of native heart without angina pectoris -has had eval with cardiology -cont bp, lipid and sugar mgt -LDL improved  7.  Colon cancer screening:  Agrees to repeat cologuard (3 yrs)  Labs/tests ordered:  * No order type specified * Next appt:  10/07/2020   Stephanee Barcomb L. Karel Turpen, D.O. [facility] [address], [place] [address] Cell Phone (Mon-Fri 8am-5pm):  [phone] On Call:  [phone] & follow prompts after 5pm & weekends Office Phone:  [phone] Office Fax:  [phone]

## 2020-06-23 DIAGNOSIS — M5134 Other intervertebral disc degeneration, thoracic region: Secondary | ICD-10-CM | POA: Diagnosis not present

## 2020-06-23 DIAGNOSIS — M9902 Segmental and somatic dysfunction of thoracic region: Secondary | ICD-10-CM | POA: Diagnosis not present

## 2020-06-23 DIAGNOSIS — M5136 Other intervertebral disc degeneration, lumbar region: Secondary | ICD-10-CM | POA: Diagnosis not present

## 2020-06-23 DIAGNOSIS — M9903 Segmental and somatic dysfunction of lumbar region: Secondary | ICD-10-CM | POA: Diagnosis not present

## 2020-06-25 DIAGNOSIS — Z1212 Encounter for screening for malignant neoplasm of rectum: Secondary | ICD-10-CM | POA: Diagnosis not present

## 2020-06-25 DIAGNOSIS — Z1211 Encounter for screening for malignant neoplasm of colon: Secondary | ICD-10-CM | POA: Diagnosis not present

## 2020-06-25 LAB — COLOGUARD: Cologuard: NEGATIVE

## 2020-07-01 ENCOUNTER — Other Ambulatory Visit: Payer: Self-pay | Admitting: *Deleted

## 2020-07-01 DIAGNOSIS — E785 Hyperlipidemia, unspecified: Secondary | ICD-10-CM

## 2020-07-02 ENCOUNTER — Encounter: Payer: Self-pay | Admitting: *Deleted

## 2020-07-02 ENCOUNTER — Telehealth: Payer: Self-pay | Admitting: *Deleted

## 2020-07-02 LAB — COLOGUARD: COLOGUARD: NEGATIVE

## 2020-07-02 NOTE — Telephone Encounter (Signed)
Called patient to switch PCP, since Dr. Mariea Clonts is not longer at office, and patient stated that she was NOT going to switch her care to anyone in our office.  Stated that her and Dr. Mariea Clonts had a discussion that Dr. Mariea Clonts would see patient in her home.   Explained to patient that she would no longer receive refills or services from our office and she Agreed and stated that she wanted to stay with Dr. Mariea Clonts.   Removed PCP from patient's chart.

## 2020-07-07 DIAGNOSIS — M9902 Segmental and somatic dysfunction of thoracic region: Secondary | ICD-10-CM | POA: Diagnosis not present

## 2020-07-07 DIAGNOSIS — M5134 Other intervertebral disc degeneration, thoracic region: Secondary | ICD-10-CM | POA: Diagnosis not present

## 2020-07-07 DIAGNOSIS — M9903 Segmental and somatic dysfunction of lumbar region: Secondary | ICD-10-CM | POA: Diagnosis not present

## 2020-07-07 DIAGNOSIS — M5136 Other intervertebral disc degeneration, lumbar region: Secondary | ICD-10-CM | POA: Diagnosis not present

## 2020-07-21 DIAGNOSIS — M9903 Segmental and somatic dysfunction of lumbar region: Secondary | ICD-10-CM | POA: Diagnosis not present

## 2020-07-21 DIAGNOSIS — M9902 Segmental and somatic dysfunction of thoracic region: Secondary | ICD-10-CM | POA: Diagnosis not present

## 2020-07-21 DIAGNOSIS — M5134 Other intervertebral disc degeneration, thoracic region: Secondary | ICD-10-CM | POA: Diagnosis not present

## 2020-07-21 DIAGNOSIS — M5136 Other intervertebral disc degeneration, lumbar region: Secondary | ICD-10-CM | POA: Diagnosis not present

## 2020-08-04 ENCOUNTER — Other Ambulatory Visit (HOSPITAL_BASED_OUTPATIENT_CLINIC_OR_DEPARTMENT_OTHER): Payer: Self-pay

## 2020-08-04 ENCOUNTER — Ambulatory Visit: Payer: Medicare Other | Attending: Internal Medicine

## 2020-08-04 DIAGNOSIS — Z23 Encounter for immunization: Secondary | ICD-10-CM

## 2020-08-04 MED ORDER — PFIZER-BIONT COVID-19 VAC-TRIS 30 MCG/0.3ML IM SUSP
INTRAMUSCULAR | 0 refills | Status: DC
  Filled 2020-08-04: qty 0.3, 1d supply, fill #0

## 2020-08-04 NOTE — Progress Notes (Signed)
   Covid-19 Vaccination Clinic  Name:  Felicia Hall    MRN: [id number] DOB: October 10, 1945  08/04/2020  Felicia Hall was observed post Covid-19 immunization for 15 minutes without incident. She was provided with Vaccine Information Sheet and instruction to access the V-Safe system.   Felicia Hall was instructed to call 911 with any severe reactions post vaccine: Marland Kitchen Difficulty breathing  . Swelling of face and throat  . A fast heartbeat  . A bad rash all over body  . Dizziness and weakness   Immunizations Administered    Name Date Dose VIS Date Route   PFIZER Comrnaty(Gray TOP) Covid-19 Vaccine 08/04/2020 10:09 AM 0.3 mL 03/20/2020 Intramuscular   Manufacturer: [place]   Lot: [id number]   NDC: [id number]

## 2020-08-05 DIAGNOSIS — M9902 Segmental and somatic dysfunction of thoracic region: Secondary | ICD-10-CM | POA: Diagnosis not present

## 2020-08-05 DIAGNOSIS — M5134 Other intervertebral disc degeneration, thoracic region: Secondary | ICD-10-CM | POA: Diagnosis not present

## 2020-08-05 DIAGNOSIS — M9903 Segmental and somatic dysfunction of lumbar region: Secondary | ICD-10-CM | POA: Diagnosis not present

## 2020-08-05 DIAGNOSIS — M5136 Other intervertebral disc degeneration, lumbar region: Secondary | ICD-10-CM | POA: Diagnosis not present

## 2020-08-12 LAB — LIPID PANEL
Chol/HDL Ratio: 3.2 ratio (ref 0.0–4.4)
Cholesterol, Total: 146 mg/dL (ref 100–199)
HDL: 46 mg/dL (ref >39)
LDL Chol Calc (NIH): 75 mg/dL (ref 0–99)
Triglycerides: 145 mg/dL (ref 0–149)
VLDL Cholesterol Cal: 25 mg/dL (ref 5–40)

## 2020-08-18 ENCOUNTER — Other Ambulatory Visit: Payer: Self-pay | Admitting: *Deleted

## 2020-08-18 MED ORDER — EZETIMIBE 10 MG PO TABS: 10.0000 mg | ORAL_TABLET | Freq: Every day | ORAL | 11 refills | Status: DC

## 2020-08-25 ENCOUNTER — Ambulatory Visit: Payer: Medicare Other | Admitting: Obstetrics and Gynecology

## 2020-09-10 DIAGNOSIS — M9903 Segmental and somatic dysfunction of lumbar region: Secondary | ICD-10-CM | POA: Diagnosis not present

## 2020-09-10 DIAGNOSIS — M5134 Other intervertebral disc degeneration, thoracic region: Secondary | ICD-10-CM | POA: Diagnosis not present

## 2020-09-10 DIAGNOSIS — M5136 Other intervertebral disc degeneration, lumbar region: Secondary | ICD-10-CM | POA: Diagnosis not present

## 2020-09-10 DIAGNOSIS — M9902 Segmental and somatic dysfunction of thoracic region: Secondary | ICD-10-CM | POA: Diagnosis not present

## 2020-09-11 ENCOUNTER — Telehealth: Payer: Self-pay

## 2020-09-11 NOTE — Telephone Encounter (Signed)
RN call to patient.  Patient wishes to keep Dr. Hollace Kinnier as her PCP. Consent for PCP sent o patient, appt made

## 2020-09-15 NOTE — Progress Notes (Signed)
Late Entry for 09/11/20 telephonic encounter clarification: RN returned call to patient to follow up on request from patient to continue with Dr. Hollace Kinnier as provider

## 2020-09-16 DIAGNOSIS — M5134 Other intervertebral disc degeneration, thoracic region: Secondary | ICD-10-CM | POA: Diagnosis not present

## 2020-09-16 DIAGNOSIS — M5136 Other intervertebral disc degeneration, lumbar region: Secondary | ICD-10-CM | POA: Diagnosis not present

## 2020-09-16 DIAGNOSIS — M9902 Segmental and somatic dysfunction of thoracic region: Secondary | ICD-10-CM | POA: Diagnosis not present

## 2020-09-16 DIAGNOSIS — M9903 Segmental and somatic dysfunction of lumbar region: Secondary | ICD-10-CM | POA: Diagnosis not present

## 2020-09-18 DIAGNOSIS — Z1231 Encounter for screening mammogram for malignant neoplasm of breast: Secondary | ICD-10-CM | POA: Diagnosis not present

## 2020-09-24 DIAGNOSIS — M9903 Segmental and somatic dysfunction of lumbar region: Secondary | ICD-10-CM | POA: Diagnosis not present

## 2020-09-24 DIAGNOSIS — M9902 Segmental and somatic dysfunction of thoracic region: Secondary | ICD-10-CM | POA: Diagnosis not present

## 2020-09-24 DIAGNOSIS — M5134 Other intervertebral disc degeneration, thoracic region: Secondary | ICD-10-CM | POA: Diagnosis not present

## 2020-09-24 DIAGNOSIS — M5136 Other intervertebral disc degeneration, lumbar region: Secondary | ICD-10-CM | POA: Diagnosis not present

## 2020-09-25 DIAGNOSIS — H401132 Primary open-angle glaucoma, bilateral, moderate stage: Secondary | ICD-10-CM | POA: Diagnosis not present

## 2020-09-25 DIAGNOSIS — H04123 Dry eye syndrome of bilateral lacrimal glands: Secondary | ICD-10-CM | POA: Diagnosis not present

## 2020-10-06 DIAGNOSIS — M5134 Other intervertebral disc degeneration, thoracic region: Secondary | ICD-10-CM | POA: Diagnosis not present

## 2020-10-06 DIAGNOSIS — M5136 Other intervertebral disc degeneration, lumbar region: Secondary | ICD-10-CM | POA: Diagnosis not present

## 2020-10-06 DIAGNOSIS — M9902 Segmental and somatic dysfunction of thoracic region: Secondary | ICD-10-CM | POA: Diagnosis not present

## 2020-10-06 DIAGNOSIS — M9903 Segmental and somatic dysfunction of lumbar region: Secondary | ICD-10-CM | POA: Diagnosis not present

## 2020-10-07 ENCOUNTER — Other Ambulatory Visit: Payer: Medicare Other | Admitting: Internal Medicine

## 2020-10-07 ENCOUNTER — Encounter: Payer: Medicare Other | Admitting: Family

## 2020-10-07 ENCOUNTER — Other Ambulatory Visit: Payer: Self-pay

## 2020-10-16 DIAGNOSIS — M5134 Other intervertebral disc degeneration, thoracic region: Secondary | ICD-10-CM | POA: Diagnosis not present

## 2020-10-16 DIAGNOSIS — M9903 Segmental and somatic dysfunction of lumbar region: Secondary | ICD-10-CM | POA: Diagnosis not present

## 2020-10-16 DIAGNOSIS — M5136 Other intervertebral disc degeneration, lumbar region: Secondary | ICD-10-CM | POA: Diagnosis not present

## 2020-10-16 DIAGNOSIS — M9902 Segmental and somatic dysfunction of thoracic region: Secondary | ICD-10-CM | POA: Diagnosis not present

## 2020-10-22 DIAGNOSIS — H04121 Dry eye syndrome of right lacrimal gland: Secondary | ICD-10-CM | POA: Diagnosis not present

## 2020-10-22 DIAGNOSIS — H04122 Dry eye syndrome of left lacrimal gland: Secondary | ICD-10-CM | POA: Diagnosis not present

## 2020-10-27 DIAGNOSIS — M5136 Other intervertebral disc degeneration, lumbar region: Secondary | ICD-10-CM | POA: Diagnosis not present

## 2020-10-27 DIAGNOSIS — M5134 Other intervertebral disc degeneration, thoracic region: Secondary | ICD-10-CM | POA: Diagnosis not present

## 2020-10-27 DIAGNOSIS — M9903 Segmental and somatic dysfunction of lumbar region: Secondary | ICD-10-CM | POA: Diagnosis not present

## 2020-10-27 DIAGNOSIS — H10413 Chronic giant papillary conjunctivitis, bilateral: Secondary | ICD-10-CM | POA: Diagnosis not present

## 2020-10-27 DIAGNOSIS — M9902 Segmental and somatic dysfunction of thoracic region: Secondary | ICD-10-CM | POA: Diagnosis not present

## 2020-10-31 ENCOUNTER — Ambulatory Visit: Payer: Medicare Other | Admitting: Obstetrics and Gynecology

## 2020-11-04 DIAGNOSIS — M5134 Other intervertebral disc degeneration, thoracic region: Secondary | ICD-10-CM | POA: Diagnosis not present

## 2020-11-04 DIAGNOSIS — M5136 Other intervertebral disc degeneration, lumbar region: Secondary | ICD-10-CM | POA: Diagnosis not present

## 2020-11-04 DIAGNOSIS — M9902 Segmental and somatic dysfunction of thoracic region: Secondary | ICD-10-CM | POA: Diagnosis not present

## 2020-11-04 DIAGNOSIS — M9903 Segmental and somatic dysfunction of lumbar region: Secondary | ICD-10-CM | POA: Diagnosis not present

## 2020-11-13 DIAGNOSIS — M9903 Segmental and somatic dysfunction of lumbar region: Secondary | ICD-10-CM | POA: Diagnosis not present

## 2020-11-13 DIAGNOSIS — M9902 Segmental and somatic dysfunction of thoracic region: Secondary | ICD-10-CM | POA: Diagnosis not present

## 2020-11-13 DIAGNOSIS — M5136 Other intervertebral disc degeneration, lumbar region: Secondary | ICD-10-CM | POA: Diagnosis not present

## 2020-11-13 DIAGNOSIS — M5134 Other intervertebral disc degeneration, thoracic region: Secondary | ICD-10-CM | POA: Diagnosis not present

## 2020-11-17 ENCOUNTER — Encounter: Payer: Self-pay | Admitting: Obstetrics and Gynecology

## 2020-11-17 ENCOUNTER — Other Ambulatory Visit: Payer: Self-pay

## 2020-11-17 ENCOUNTER — Ambulatory Visit (INDEPENDENT_AMBULATORY_CARE_PROVIDER_SITE_OTHER): Payer: Medicare Other | Admitting: Obstetrics and Gynecology

## 2020-11-17 VITALS — BP 159/81 | HR 106 | Ht 65.0 in | Wt 161.0 lb

## 2020-11-17 DIAGNOSIS — N393 Stress incontinence (female) (male): Secondary | ICD-10-CM

## 2020-11-17 DIAGNOSIS — N3281 Overactive bladder: Secondary | ICD-10-CM

## 2020-11-17 NOTE — Patient Instructions (Signed)

## 2020-11-17 NOTE — Progress Notes (Signed)
[facility] New Patient Evaluation and Consultation  Referring Provider: Gayland Curry, DO PCP: Gayland Curry, DO Date of Service: 11/17/2020  SUBJECTIVE Chief Complaint: overactive bladder  History of Present Illness: Felicia Hall is a 75 y.o. White or Caucasian female seen presenting for evaluation of overactive bladder.    Urinary Symptoms: Leaks urine with cough/ sneeze and with a full bladder. Leaks 0-3 time(s) per day.  Pad use none She is not bothered by her UI symptoms. But is bothered by urine frequency  Day time voids 15-18.  Nocturia: 2-3 times per night to void. Voiding dysfunction: she empties her bladder well.  does not use a catheter to empty bladder.  When urinating, she feels a weak stream and dribbling after finishing Drinks: small glass orange juice, 3-4 8oz glasses decaf tea, water, sugar free flavored water   UTIs:  0  UTI's in the last year.   Denies history of blood in urine and kidney or bladder stones  Pelvic Organ Prolapse Symptoms:                  She Denies a feeling of a bulge the vaginal area.   Bowel Symptom: Bowel movements: 1-2 time(s) per day Stool consistency: soft  Straining: yes.  Splinting: no.  Incomplete evacuation: yes.  She Denies accidental bowel leakage / fecal incontinence Bowel regimen: none  Sexual Function Sexually active: no.  Sexual orientation:  heterosexual   Pelvic Pain Denies pelvic pain    Past Medical History:  Past Medical History:  Diagnosis Date   Acute upper respiratory infections of unspecified site    Allergic rhinitis due to pollen    Benign essential hypertension    Cervical spondylosis without myelopathy    Cervicalgia    Controlled type 2 diabetes mellitus without complication, without long-term current use of insulin ([place]) 10/01/2019   Diaphragmatic hernia without mention of obstruction or gangrene    Disorder of bone and cartilage, unspecified    Diverticulosis of colon  (without mention of hemorrhage)    Encounter for long-term (current) use of other medications    GERD (gastroesophageal reflux disease)    History of hiatal hernia    Hyperlipidemia LDL goal < 100    Keratoconjunctivitis sicca, 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's    Other and unspecified hyperlipidemia    Pain in joint, site unspecified    Pre-diabetes    Routine general medical examination at a health care facility    Sjogren's syndrome ([place]) 10/27/2012   Tear film insufficiency, unspecified    Unspecified disorder of kidney and ureter    Unspecified glaucoma(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 Prudencio Burly   CHOLECYSTECTOMY  1993   Dr.Lindsey    DILATION AND CURETTAGE OF UTERUS     LUMBAR LAMINECTOMY/DECOMPRESSION MICRODISCECTOMY N/A 04/21/2017   Procedure: BILATERAL LUMBAR FOUR- LUMBAR FIVE AND LEFT LUMBAR FIVE- SACRAL ONE LAMINOTOMY, MEDIAL FACETECTOMY AND FORAMINOTOMIES;  Surgeon: Jovita Gamma, MD;  Location: [facility];  Service: Neurosurgery;  Laterality: N/A;   NASAL SEPTUM SURGERY       Past OB/GYN History: OB History  Gravida Para Term Preterm AB Living  0 0 0 0 0 0  SAB IAB Ectopic Multiple Live Births  0 0 0 0 0  Menopausal: Yes, at age 1, Denies vaginal bleeding since menopause Last pap smear was about 2 years ago.  Any history of abnormal pap smears: no.   Medications: She has a current medication list which includes the following prescription(s): aspirin ec, brinzolamide, calcium citrate-vitamin d, cholecalciferol, pfizer-biont covid-19 vac-tris, covid-19 mrna vaccine (pfizer), losartan, pantoprazole, rosuvastatin, and ezetimibe.   Allergies: Patient is allergic to statins, allopurinol, and darvocet [propoxyphene n-acetaminophen].   Social History:  Social History   Tobacco Use   Smoking  status: Never   Smokeless tobacco: Never  Vaping Use   Vaping Use: Never used  Substance Use Topics   Alcohol use: Yes    Alcohol/week: 0.0 standard drinks    Comment: once a year   Drug use: No    Relationship status: single She lives alone She is not employed. Regular exercise: Yes: stationary bike, weights History of abuse: No  Family History:   Family History  Problem Relation Age of Onset   Heart attack Mother    Breast cancer Mother    Pancreatitis Father    Heart disease Brother        By-pass surgery   Lupus Cousin      Review of Systems: Review of Systems  Constitutional:  Negative for fever, malaise/fatigue and weight loss.  Respiratory:  Negative for cough, shortness of breath and wheezing.   Cardiovascular:  Negative for chest pain, palpitations and leg swelling.  Gastrointestinal:  Negative for abdominal pain and blood in stool.  Genitourinary:  Negative for dysuria.  Musculoskeletal:  Positive for myalgias.  Skin:  Negative for rash.  Neurological:  Negative for dizziness and headaches.  Endo/Heme/Allergies:  Does not bruise/bleed easily.  Psychiatric/Behavioral:  Negative for depression. The patient is not nervous/anxious.     OBJECTIVE Physical Exam: Vitals:   11/17/20 1144  BP: (!) 159/81  Pulse: (!) 106  Weight: 161 lb (73 kg)  Height: '5\' 5"'$  (1.651 m)    Physical Exam   GU / Detailed Urogynecologic Evaluation:  Pelvic Exam: Normal external female genitalia; Bartholin's and Skene's glands normal in appearance; urethral meatus normal in appearance, no urethral masses or discharge.   CST: negative   Speculum exam reveals normal vaginal mucosa with atrophy. Cervix normal appearance. Uterus normal single, nontender. Adnexa no mass, fullness, tenderness.    Pelvic floor strength II/V  Pelvic floor musculature: Right levator non-tender, Right obturator non-tender, Left levator non-tender, Left obturator non-tender  POP-Q:   POP-Q  -0.5                                             Aa   -0.5                                           Ba  -6.5                                              C   2  Gh  3.5                                            Pb  8.5                                            tvl   -2                                            Ap  -2                                            Bp  -8                                              D     Rectal Exam:  Normal external rectum  Post-Void Residual (PVR) by Bladder Scan: In order to evaluate bladder emptying, we discussed obtaining a postvoid residual and she agreed to this procedure.  Procedure: The ultrasound unit was placed on the patient's abdomen in the suprapubic region after the patient had voided. A PVR of 22 ml was obtained by bladder scan.  Laboratory Results: Unable to provide urine sample  ASSESSMENT AND PLAN Felicia Hall is a 75 y.o. with:  1. Overactive bladder   2. SUI (stress urinary incontinence, female)    OAB - We discussed the symptoms of overactive bladder (OAB), which include urinary urgency, urinary frequency, nocturia, with or without urge incontinence.  While we do not know the exact etiology of OAB, several treatment options exist. We discussed management including behavioral therapy (decreasing bladder irritants, urge suppression strategies, timed voids, bladder retraining), physical therapy, medication; for refractory cases posterior tibial nerve stimulation, sacral neuromodulation, and intravesical botulinum toxin injection.  - She is not a candidate for anticholinergic medications due to Sjogrens and glaucoma. Her BP is elevated so also not a candidate for Beta3 agonist.  - She is potentially interested in PTNS. She would like to work first with physical therapy and reducing bladder irritants. PT referral placed.   2. SUI - rare, not as bothersome. Will pursue PT.   Return 3  months  Jaquita Folds, MD   Time spent: I spent 50 minutes dedicated to the care of this patient on the date of this encounter to include pre-visit review of records, face-to-face time with the patient and post visit documentation and ordering medication/ testing.

## 2020-11-25 DIAGNOSIS — M5134 Other intervertebral disc degeneration, thoracic region: Secondary | ICD-10-CM | POA: Diagnosis not present

## 2020-11-25 DIAGNOSIS — M9903 Segmental and somatic dysfunction of lumbar region: Secondary | ICD-10-CM | POA: Diagnosis not present

## 2020-11-25 DIAGNOSIS — M9902 Segmental and somatic dysfunction of thoracic region: Secondary | ICD-10-CM | POA: Diagnosis not present

## 2020-11-25 DIAGNOSIS — M5136 Other intervertebral disc degeneration, lumbar region: Secondary | ICD-10-CM | POA: Diagnosis not present

## 2020-12-03 ENCOUNTER — Other Ambulatory Visit: Payer: Self-pay

## 2020-12-03 ENCOUNTER — Encounter: Payer: Self-pay | Admitting: Physical Therapy

## 2020-12-03 ENCOUNTER — Ambulatory Visit: Payer: Medicare Other | Attending: Obstetrics and Gynecology | Admitting: Physical Therapy

## 2020-12-03 DIAGNOSIS — R2689 Other abnormalities of gait and mobility: Secondary | ICD-10-CM | POA: Diagnosis not present

## 2020-12-03 DIAGNOSIS — R279 Unspecified lack of coordination: Secondary | ICD-10-CM | POA: Diagnosis not present

## 2020-12-03 DIAGNOSIS — M6281 Muscle weakness (generalized): Secondary | ICD-10-CM | POA: Insufficient documentation

## 2020-12-03 NOTE — Patient Instructions (Addendum)
Bladder Irritants  Certain foods and beverages can be irritating to the bladder.  Avoiding these irritants may decrease your symptoms of urinary urgency, frequency or bladder pain.  Even reducing your intake can help with your symptoms.  Not everyone is sensitive to all bladder irritants, so you may consider focusing on one irritant at a time, removing or reducing your intake of that irritant for 7-10 days to see if this change helps your symptoms.  Water intake is also very important.  Below is a list of bladder irritants.  Drinks: alcohol, carbonated beverages, caffeinated beverages such as coffee and tea, drinks with artificial sweeteners, citrus juices, apple juice, tomato juice  Foods: tomatoes and tomato based foods, spicy food, sugar and artificial sweeteners, vinegar, chocolate, raw onion, apples, citrus fruits, pineapple, cranberries, tomatoes, strawberries, plums, peaches, cantaloupe  Other: acidic urine (too concentrated) - see water intake info below  Substitutes you can try that are NOT irritating to the bladder: cooked onion, pears, papayas, sun-brewed decaf teas, watermelons, non-citrus herbal teas, apricots, kava and low-acid instant drinks (Postum).    WATER INTAKE: Remember to drink lots of water (aim for fluid intake of half your body weight with 2/3 of fluids being water).  You may be limiting fluids due to fear of leakage, but this can actually worsen urgency symptoms due to highly concentrated urine.  Water helps balance the pH of your urine so it doesn't become too acidic - acidic urine is a bladder irritant!  Urge Incontinence  Ideal urination frequency is every 2-4 wakeful hours, which equates to 5-8 times within a 24-hour period.   Urge incontinence is leakage that occurs when the bladder muscle contracts, creating a sudden need to go before getting to the bathroom.   Going too often when your bladder isn't actually full can disrupt the body's automatic signals to  store and hold urine longer, which will increase urgency/frequency.  In this case, the bladder "is running the show" and strategies can be learned to retrain this pattern.   One should be able to control the first urge to urinate, at around 150mL.  The bladder can hold up to a "grande latte," or 400mL. To help you gain control, practice the Urge Drill below when urgency strikes.  This drill will help retrain your bladder signals and allow you to store and hold urine longer.  The overall goal is to stretch out your time between voids to reach a more manageable voiding schedule.    Practice your "quick flicks" often throughout the day (each waking hour) even when you don't need feel the urge to go.  This will help strengthen your pelvic floor muscles, making them more effective in controlling leakage.  Urge Drill  When you feel an urge to go, follow these steps to regain control: Stop what you are doing and be still Take one deep breath, directing your air into your abdomen Think an affirming thought, such as "I've got this." Do 5 quick flicks of your pelvic floor Walk with control to the bathroom to void, or delay voiding   Relaxation Exercises with the Urge to Void   When you experience an urge to void:  FIRST  Stop and stand very still    Sit down if you can    Don't move    You need to stay very still to maintain control  SECOND Squeeze your pelvic floor muscles 5 times, like a quick flick, to keep from leaking  THIRD Relax  Take   a deep breath and then let it out  Try to make the urge go away by using relaxation and visualization techniques  FINALLY When you feel the urge go away somewhat, walk normally to the bathroom.   If the urge gets suddenly stronger on the way, you may stop again and relax to regain control.    

## 2020-12-03 NOTE — Therapy (Signed)
[facility] [address], [place], [place], [address] Phone: [phone]   Fax:  [phone]  Physical Therapy Evaluation  Patient Details  Name: Felicia Hall MRN: [id number] Date of Birth: 04-14-1945 Referring Provider (PT): Jaquita Folds, MD   Encounter Date: 12/03/2020   PT End of Session - 12/03/20 1531     Visit Number 1    Date for PT Re-Evaluation 04/04/21    Authorization Type medicare    Progress Note Due on Visit 10    PT Start Time 1418    PT Stop Time 1445    PT Time Calculation (min) 27 min    Activity Tolerance Patient tolerated treatment well    Behavior During Therapy [facility] for tasks assessed/performed             Past Medical History:  Diagnosis Date   Acute upper respiratory infections of unspecified site    Allergic rhinitis due to pollen    Benign essential hypertension    Cervical spondylosis without myelopathy    Cervicalgia    Controlled type 2 diabetes mellitus without complication, without long-term current use of insulin ([place]) 10/01/2019   Diaphragmatic hernia without mention of obstruction or gangrene    Disorder of bone and cartilage, unspecified    Diverticulosis of colon (without mention of hemorrhage)    Encounter for long-term (current) use of other medications    GERD (gastroesophageal reflux disease)    History of hiatal hernia    Hyperlipidemia LDL goal < 100    Keratoconjunctivitis sicca, 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's    Other and unspecified hyperlipidemia    Pain in joint, site unspecified    Pre-diabetes    Routine general medical examination at a health care facility    Sjogren's syndrome ([place]) 10/27/2012   Tear film insufficiency, unspecified    Unspecified disorder of kidney and ureter    Unspecified glaucoma(365.9)     Past Surgical History:  Procedure Laterality Date   BELPHAROPTOSIS REPAIR     brow lift   bil   CATARACT EXTRACTION W/ INTRAOCULAR  LENS  IMPLANT, BILATERAL Bilateral 12/2014   Dr. Prudencio Burly   CHOLECYSTECTOMY  1993   Dr.Lindsey    DILATION AND CURETTAGE OF UTERUS     LUMBAR LAMINECTOMY/DECOMPRESSION MICRODISCECTOMY N/A 04/21/2017   Procedure: BILATERAL LUMBAR FOUR- LUMBAR FIVE AND LEFT LUMBAR FIVE- SACRAL ONE LAMINOTOMY, MEDIAL FACETECTOMY AND FORAMINOTOMIES;  Surgeon: Jovita Gamma, MD;  Location: [facility];  Service: Neurosurgery;  Laterality: N/A;   NASAL SEPTUM SURGERY      There were no vitals filed for this visit.    Subjective Assessment - 12/03/20 1420     Subjective Pt reports "I have to pee all the time. At least once an hour during the day." Pt also reports she gets up to urinate at least 2-3x per times per night up to 4-5x. Does have urinary leakage with sneezing and strong urge with full bladder and unable to get to a bathroom quickly enough. Pt reports sometimes she feels she is not completely empty of bladder and sits longer to fully empty. Pt has issues with constipation most of the time feels she is able to empty bowels but does report she needs to strain to complete BM.    How long can you sit comfortably? no limits    How long can you stand comfortably? no limits    How long can you walk comfortably? no limits    Patient  Stated Goals to have less leakage    Currently in Pain? No/denies                [facility] PT Assessment - 12/03/20 0001       Assessment   Medical Diagnosis N32.81 (ICD-10-CM) - Overactive bladder    Referring Provider (PT) Jaquita Folds, MD    Prior Therapy had previous therapy for hips but never had pelvic floor PT.      Precautions   Precautions None      Restrictions   Weight Bearing Restrictions No      Balance Screen   Has the patient fallen in the past 6 months No    Has the patient had a decrease in activity level because of a fear of falling?  No    Is the patient reluctant to leave their home because of a fear of falling?  No      Home Environment    Living Environment Private residence    [place] entry    [place] None      Prior Function   Level of [place] Retired    Leisure reading, puzzles      Cognition   Overall Cognitive Status Within Functional Limits for tasks assessed      Sensation   Light Touch Appears Intact      Coordination   Gross Motor Movements are Fluid and Coordinated Yes    Fine Motor Movements are Fluid and Coordinated Yes      Posture/Postural Control   Posture/Postural Control Postural limitations    Postural Limitations Rounded Shoulders;Posterior pelvic tilt;Increased thoracic kyphosis      ROM / Strength   AROM / PROM / Strength AROM;Strength      AROM   Overall AROM Comments decreased by 50% in bil side bending, rotation, flexion and 25% in extension in thoracic and lumbar spine      Strength   Overall Strength Comments bil hips 3+/5 globally except flexion 4/5      Flexibility   Soft Tissue Assessment /Muscle Length yes      Palpation   Palpation comment fascial restrictions noted in all quadrants of abdomen                        Objective measurements completed on examination: See above findings.     Pelvic Floor Special Questions - 12/03/20 0001     Prior Pelvic/Prostate Exam Yes    Are you Pregnant or attempting pregnancy? No    Prior Pregnancies No    Currently Sexually Active No    History of sexually transmitted disease No    Urinary Leakage Yes    How often 2-3s per week    Pad use not usually but will with a cold due to increased sneezing    Activities that cause leaking Sneezing;With strong urge;Lifting    Urinary urgency No    Urinary frequency yes   15-18x per day and 2-5 times night   Fecal incontinence No    Fluid intake yes feels like she drinks enough daily    Caffeine beverages some but not a lot per pt    Falling out feeling (prolapse) No     External Perineal Exam deferred    Pelvic Floor Internal Exam deferred  PT Education - 12/03/20 1520     Education Details Pt educated on exam findings, POC, HEP, urge drill and bladder irritants.    Person(s) Educated Patient    Methods Explanation;Demonstration;Tactile cues;Verbal cues    Comprehension Verbalized understanding;Returned demonstration              PT Short Term Goals - 12/03/20 1624       PT SHORT TERM GOAL #1   Title Pt to be I with HEP    Time 6    Period Weeks    Status New    Target Date 01/14/21      PT SHORT TERM GOAL #2   Title pt to report no more than 3x urinary voids consistently at night for improved sleep at night    Baseline up to 5 times a night    Time 6    Period Weeks    Status New    Target Date 01/14/21      PT SHORT TERM GOAL #3   Title pt to report urinary voids between 1-2 hours for decreased frequency to improve ability to complete community outings    Time 6    Period Weeks    Status New    Target Date 01/14/21      PT SHORT TERM GOAL #4   Title pt to demonstrate at least 4/5 globally in bil hips for improved mobility in community and improved pelvic stability    Time 6    Period Weeks    Status New    Target Date 01/14/21               PT Long Term Goals - 12/03/20 1626       PT LONG TERM GOAL #1   Title pt to be I with advanced HEP    Time 4    Period Months    Status New    Target Date 04/04/21      PT LONG TERM GOAL #2   Title pt to report no more than 1-2x urinary voids consistently at night for improved sleep at night    Time 4    Period Months    Status New    Target Date 04/04/21      PT LONG TERM GOAL #3   Title pt to demonstrate at least 4+/5 globally in bil hips for improved mobility in community and improved pelvic stability    Time 4    Period Months    Status New    Target Date 04/04/21      PT LONG TERM GOAL #4   Title pt to report improved ability  to correct and maintain proper coordination breathing pattern with core/pelvic floor contract/relax with activity at least 75% of the time    Time 4    Period Months    Status New    Target Date 04/04/21      PT LONG TERM GOAL #5   Title pt to demonstrate at least 3/5 pelvic floor strength for improved ability to prevent urinary leakage    Time 4    Period Months    Status New    Target Date 04/04/21                    Plan - 12/03/20 1614     Clinical Impression Statement Pt is 75yo female presenting to clinic with chronic history of increased urinary  frequency both during the day and night reporting going  at least every 1 hour if not more during the day and 2-5x per night depending on the night. Pt has history of Sjogren's and reports she has to drink a lot of water during the day and night. Pt states she does have some leakage with sneezing and lifting, does not wear pads unless she has a cold. Pt states she has had this problem for a long time but it has gotten worse in the past few months. Pt found to have weakness globally, decreased core activation and required multiple cues to activiate TA, impaired breathing pattern, and decreased rib mobility and fascial restrictions in abdomen. Pt denied vaginal internal exam this session.  Pt also reports she has intermittent consitpation and sometimes feels that he is unable to fully empty. Pt educated on bladder irritants, urge drill at end of session and given HEP. Pt denied questions and agreeable to POC at this time. Pt would benefit from continued PT to address deficits found.    Personal Factors and Comorbidities Age;Time since onset of injury/illness/exacerbation    Examination-Activity Limitations Continence;Lift    Examination-Participation Restrictions Community Activity    Stability/Clinical Decision Making Stable/Uncomplicated    Clinical Decision Making Low    Rehab Potential Good    PT Frequency 1x / week    PT Duration  --   10 vists   PT Treatment/Interventions ADLs/Self Care Home Management;Aquatic Therapy;Functional mobility training;Therapeutic activities;Therapeutic exercise;Neuromuscular re-education;Manual techniques;Patient/family education;Passive range of motion;Energy conservation;Taping    PT Next Visit Plan go over HEP,, voiding mechanics, breathing mechanics, timed voiding    PT Home Exercise Plan [id number]    Consulted and Agree with Plan of Care Patient             Patient will benefit from skilled therapeutic intervention in order to improve the following deficits and impairments:  Increased fascial restricitons, Decreased coordination, Decreased endurance, Decreased mobility, Decreased strength, Postural dysfunction, Improper body mechanics, Impaired flexibility  Visit Diagnosis: Muscle weakness (generalized) - Plan: PT plan of care cert/re-cert  Lack of coordination - Plan: PT plan of care cert/re-cert  Other abnormalities of gait and mobility - Plan: PT plan of care cert/re-cert     Problem List Patient Active Problem List   Diagnosis Date Noted   Controlled type 2 diabetes mellitus without complication, without long-term current use of insulin ([place]) 10/01/2019   Trochanteric bursitis 10/01/2019   Dizziness 10/01/2019   Palpitations 10/01/2019   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   Benign essential tremor 01/03/2017   Irritable bowel syndrome with constipation 08/09/2016   Neck pain, chronic 08/09/2016   Spinal stenosis in cervical region 08/09/2016   Generalized constriction of visual field 06/22/2016   Chronic pansinusitis 10/17/2015   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   Perennial allergic rhinitis 10/17/2015   Chronic pain syndrome 04/30/2013   Hyperglycemia 04/30/2013   Low back pain with right-sided sciatica 04/30/2013   Osteopenia, 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   Family history of colonic  polyps 04/30/2013   Sjogren's syndrome ([place]) 10/27/2012   Keratoconjunctivitis sicca, 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's    Encounter for long-term (current) use of other medications    Disorder of bone and cartilage, unspecified    Allergic rhinitis due to pollen    Cervical spondylosis without myelopathy    Unspecified glaucoma(365.9)    Benign essential hypertension    Mixed hyperlipidemia     Stacy Gardner, PT 08/24/224:33 PM   [facility] [address], [place], [place], [address] Phone: [phone]   Fax:  [phone]  Name: Felicia Hall MRN: [id number] Date of Birth: [birth year]

## 2020-12-04 DIAGNOSIS — M5136 Other intervertebral disc degeneration, lumbar region: Secondary | ICD-10-CM | POA: Diagnosis not present

## 2020-12-04 DIAGNOSIS — M5134 Other intervertebral disc degeneration, thoracic region: Secondary | ICD-10-CM | POA: Diagnosis not present

## 2020-12-04 DIAGNOSIS — M9902 Segmental and somatic dysfunction of thoracic region: Secondary | ICD-10-CM | POA: Diagnosis not present

## 2020-12-04 DIAGNOSIS — M9903 Segmental and somatic dysfunction of lumbar region: Secondary | ICD-10-CM | POA: Diagnosis not present

## 2020-12-10 NOTE — Progress Notes (Deleted)
Cardiology Office Note:    Date:  12/10/2020   ID:  Felicia Hall, DOB [birth year], MRN [id number]  PCP:  Gayland Curry, DO Referring MD: Reed, Tiffany L, [facility]  Cardiologist:  Donato Heinz, MD ***  Reason for visit: ***  History of Present Illness:    Felicia Hall is a 75 y.o. female with a hx of T2DM, GERD, hyperlipidemia, hypertension.   Brother had CABG in 70s.  She has had issues with myalgias on statins in the past.  She was referred by Dr. Mariea Clonts for evaluation of palpitations, sweating on 10/24/2019. Zio patch x14 days on 11/21/2019 showed no significant arrhythmias.  Calcium score on 8/2/221 was 463 (86 percentile).  Echo 02/04/2020 showed LVEF 70 to 75%, normal RV function, grade 1 diastolic dysfunction, mild MR, recorded as severe pulmonary artery systolic pressure elevation but pulmonary pressures appear normal.   Lexiscan Myoview on 02/04/2020 showed normal perfusion, EF 76%.  She last saw Dr. Gardiner Rhyme on 05/12/20.  With an elevated calcium score and LDL >70, her Crestor was increased from '10mg'$  to '20mg'$  daily.  Recommended f/u in 1 year.  ***  CAD/ Aortic atherosclerosis - Calcium score on 8/2/221 was 463 (86 percentile).  She reported atypical lower chest/epigastric pain.  Echo 02/04/2020 showed LVEF 70 to 75%, normal RV function, grade 1 diastolic dysfunction, mild MR, recorded as severe pulmonary artery systolic pressure elevation but pulmonary pressures appear normal.  Lexiscan Myoview on 02/04/2020 showed normal perfusion, EF 76%. - ***  Palpitations - Zio patch x14 days on 11/21/2019 showed no significant arrhythmias.  Hyperlipidemia - On Fenofibrate 145 mg daily.  LDL 76 on 01/29/2020.  Calcium score on 8/2/221 was 463 (86 percentile).  On rosuvastatin 10 mg daily, will increase to 20 mg daily as above.  Recheck lipid panel in 2 to 3 months   Hypertension - On losartan 50 mg daily.  Appears controlled  Disposition:  ***  Past Medical History:  Diagnosis Date   Acute upper respiratory infections of unspecified site    Allergic rhinitis due to pollen    Benign essential hypertension    Cervical spondylosis without myelopathy    Cervicalgia    Controlled type 2 diabetes mellitus without complication, without long-term current use of insulin ([place]) 10/01/2019   Diaphragmatic hernia without mention of obstruction or gangrene    Disorder of bone and cartilage, unspecified    Diverticulosis of colon (without mention of hemorrhage)    Encounter for long-term (current) use of other medications    GERD (gastroesophageal reflux disease)    History of hiatal hernia    Hyperlipidemia LDL goal < 100    Keratoconjunctivitis sicca, 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's    Other and unspecified hyperlipidemia    Pain in joint, site unspecified    Pre-diabetes    Routine general medical examination at a health care facility    Sjogren's syndrome ([place]) 10/27/2012   Tear film insufficiency, unspecified    Unspecified disorder of kidney and ureter    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 Prudencio Burly   CHOLECYSTECTOMY  1993   Dr.Lindsey    DILATION AND CURETTAGE OF UTERUS     LUMBAR LAMINECTOMY/DECOMPRESSION MICRODISCECTOMY N/A 04/21/2017   Procedure: BILATERAL LUMBAR FOUR-  LUMBAR FIVE AND LEFT LUMBAR FIVE- SACRAL ONE LAMINOTOMY, MEDIAL FACETECTOMY AND FORAMINOTOMIES;  Surgeon: Jovita Gamma, MD;  Location: [facility];  Service: Neurosurgery;  Laterality: N/A;   NASAL SEPTUM SURGERY      Current Medications: No outpatient medications have been marked as taking for the 12/11/20 encounter (Appointment) with Warren Lacy, PA-C.     Allergies:   Statins, Allopurinol, and Darvocet [propoxyphene n-acetaminophen]   Social History   Socioeconomic History    Marital status: Single    Spouse name: Not on file   Number of children: Not on file   Years of education: Not on file   Highest education level: Not on file  Occupational History   Occupation: Herbalist  Tobacco Use   Smoking status: Never   Smokeless tobacco: Never  Vaping Use   Vaping Use: Never used  Substance and Sexual Activity   Alcohol use: Yes    Alcohol/week: 0.0 standard drinks    Comment: once a year   Drug use: No   Sexual activity: Not on file  Other Topics Concern   Not on file  Social History Narrative   Single   No children   Never smoked   Alcohol none   Exercise -stretches, hand weights    Social Determinants of Health   Financial Resource Strain: Not on file  Food Insecurity: Not on file  Transportation Needs: Not on file  Physical Activity: Not on file  Stress: Not on file  Social Connections: Not on file     Family History: The patient's family history includes Breast cancer in her mother; Heart attack in her mother; Heart disease in her brother; Lupus in her cousin; Pancreatitis in her father.  ROS:   Please see the history of present illness.     EKGs/Labs/Other Studies Reviewed:    EKG:  The ekg ordered today demonstrates ***  Recent Labs: 06/09/2020: BUN 13; Creat 0.72; Potassium 4.2; Sodium 136  Recent Lipid Panel    Component Value Date/Time   CHOL 146 08/12/2020 0805   TRIG 145 08/12/2020 0805   HDL 46 08/12/2020 0805   CHOLHDL 3.2 08/12/2020 0805   CHOLHDL 2.9 06/09/2020 0828   VLDL 36 (H) 08/06/2016 0825   LDLCALC 75 08/12/2020 0805   LDLCALC 69 06/09/2020 0828    Physical Exam:    VS:  There were no vitals taken for this visit.    Wt Readings from Last 3 Encounters:  11/17/20 161 lb (73 kg)  06/12/20 161 lb (73 kg)  05/12/20 157 lb 9.6 oz (71.5 kg)     GEN: *** Well nourished, well developed in no acute distress HEENT: Normal NECK: No JVD; No carotid bruits CARDIAC: ***RRR, no murmurs, rubs,  gallops RESPIRATORY:  Clear to auscultation without rales, wheezing or rhonchi  ABDOMEN: Soft, non-tender, non-distended MUSCULOSKELETAL: No edema; No deformity  SKIN: Warm and dry NEUROLOGIC:  Alert and oriented PSYCHIATRIC:  Normal affect   ASSESSMENT AND PLAN   ***   {Are you ordering a CV Procedure (e.g. stress test, cath, DCCV, TEE, etc)?   Press F2        :[id number]    Medication Adjustments/Labs and Tests Ordered: Current medicines are reviewed at length with the patient today.  Concerns regarding medicines are outlined above.  No orders of the defined types were placed in this encounter.  No orders of the defined types were placed in this encounter.   There are no Patient Instructions on file for this  visit.   Signed, Warren Lacy, PA-C  12/10/2020 9:57 PM    [facility]

## 2020-12-11 ENCOUNTER — Ambulatory Visit: Payer: Medicare Other | Admitting: Physician Assistant

## 2020-12-11 DIAGNOSIS — E782 Mixed hyperlipidemia: Secondary | ICD-10-CM

## 2020-12-11 DIAGNOSIS — I10 Essential (primary) hypertension: Secondary | ICD-10-CM

## 2020-12-11 NOTE — Progress Notes (Signed)
Cardiology Office Note:    Date:  12/12/2020   ID:  Felicia Hall, DOB 03/24/1946, MRN [id number]  PCP:  Gayland Curry, DO Referring MD: Reed, Tiffany L, [place]  Cardiologist:  Donato Heinz, MD   Reason for visit: Concern for statin induced myalgias  History of Present Illness:    Felicia Hall is a 75 y.o. female with a hx of GERD, hyperlipidemia, hypertension.   Brother had CABG in 43s.  She has had issues with myalgias on statins in the past.  She was referred by Dr. Mariea Clonts for evaluation of palpitations, sweating on 10/24/2019. Zio patch x14 days on 11/21/2019 showed no significant arrhythmias.  Calcium score on 8/2/221 was 463 (86 percentile).  Echo 02/04/2020 showed LVEF 70 to 75%, normal RV function, grade 1 diastolic dysfunction, mild MR, recorded as severe pulmonary artery systolic pressure elevation but pulmonary pressures appear normal.   Lexiscan Myoview on 02/04/2020 showed normal perfusion, EF 76%.  She last saw Dr. Gardiner Rhyme on 05/12/20.  With an elevated calcium score and LDL >70, her Crestor was increased from '10mg'$  to '20mg'$  daily.  Her cholesterol was rechecked May 2022 and her LDL is 75.  With LDL greater than 70, Zetia was added.  Today the patient made an appointment to discuss what she believes is statin induced myalgias.  She states her muscle aches have worsened since November 2021.  The last 6 months especially she has felt weaker.  With weaker muscles, she noticed more flares of her bursitis.  She had similar issues with statins approximately 15 years ago.  She lives alone though has housecleaner as someone that helps with the yard.  She has chronic back pain.  While deciphering when her muscle aches worsened, she believes it may be related to when her Crestor was increased from 10 to 20 mg.  Otherwise, She denies chest pain, shortness of breath, syncope, orthopnea, PND or significant pedal edema.  Past Medical History:   Diagnosis Date   Acute upper respiratory infections of unspecified site    Allergic rhinitis due to pollen    Benign essential hypertension    Cervical spondylosis without myelopathy    Cervicalgia    Controlled type 2 diabetes mellitus without complication, without long-term current use of insulin ([place]) 10/01/2019   Diaphragmatic hernia without mention of obstruction or gangrene    Disorder of bone and cartilage, unspecified    Diverticulosis of colon (without mention of hemorrhage)    Encounter for long-term (current) use of other medications    GERD (gastroesophageal reflux disease)    History of hiatal hernia    Hyperlipidemia LDL goal < 100    Keratoconjunctivitis sicca, 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's    Other and unspecified hyperlipidemia    Pain in joint, site unspecified    Pre-diabetes    Routine general medical examination at a health care facility    Sjogren's syndrome ([place]) 10/27/2012   Tear film insufficiency, unspecified    Unspecified disorder of kidney and ureter    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 Prudencio Burly   CHOLECYSTECTOMY  1993   Dr.Lindsey    DILATION AND CURETTAGE OF UTERUS     LUMBAR LAMINECTOMY/DECOMPRESSION MICRODISCECTOMY N/A 04/21/2017   Procedure: BILATERAL LUMBAR  FOUR- LUMBAR FIVE AND LEFT LUMBAR FIVE- SACRAL ONE LAMINOTOMY, MEDIAL FACETECTOMY AND FORAMINOTOMIES;  Surgeon: Jovita Gamma, MD;  Location: [facility];  Service: Neurosurgery;  Laterality: N/A;   NASAL SEPTUM SURGERY      Current Medications: Current Meds  Medication Sig   aspirin EC 81 MG tablet Take 1 tablet (81 mg total) by mouth daily. Swallow whole.   brinzolamide (AZOPT) 1 % ophthalmic suspension Place 1 drop into both eyes 2 (two) times daily.   calcium citrate-vitamin D (CITRACAL+D) 315-200 MG-UNIT per tablet  Take 1 tablet by mouth 4 (four) times daily.   cholecalciferol (VITAMIN D) 1000 UNITS tablet Take 2,000 Units by mouth daily.    Chromium Picolinate 800 MCG TABS Take by mouth daily after breakfast.   COVID-19 mRNA Vac-TriS, Pfizer, (PFIZER-BIONT COVID-19 VAC-TRIS) SUSP injection Inject into the muscle.   COVID-19 mRNA vaccine, Pfizer, 30 MCG/0.3ML injection INJECT AS DIRECTED   losartan (COZAAR) 50 MG tablet Take one tablet by mouth once daily for blood pressure   pantoprazole (PROTONIX) 40 MG tablet Take 1 tablet (40 mg total) by mouth daily before breakfast.   [DISCONTINUED] rosuvastatin (CRESTOR) 20 MG tablet Take 1 tablet (20 mg total) by mouth daily.     Allergies:   Statins, Allopurinol, and Darvocet [propoxyphene n-acetaminophen]   Social History   Socioeconomic History   Marital status: Single    Spouse name: Not on file   Number of children: Not on file   Years of education: Not on file   Highest education level: Not on file  Occupational History   Occupation: Herbalist  Tobacco Use   Smoking status: Never   Smokeless tobacco: Never  Vaping Use   Vaping Use: Never used  Substance and Sexual Activity   Alcohol use: Yes    Alcohol/week: 0.0 standard drinks    Comment: once a year   Drug use: No   Sexual activity: Not on file  Other Topics Concern   Not on file  Social History Narrative   Single   No children   Never smoked   Alcohol none   Exercise -stretches, hand weights    Social Determinants of Health   Financial Resource Strain: Not on file  Food Insecurity: Not on file  Transportation Needs: Not on file  Physical Activity: Not on file  Stress: Not on file  Social Connections: Not on file     Family History: The patient's family history includes Breast cancer in her mother; Heart attack in her mother; Heart disease in her brother; Lupus in her cousin; Pancreatitis in her father.  ROS:   Please see the history of present illness.      EKGs/Labs/Other Studies Reviewed:    EKG:  The ekg ordered today demonstrates normal sinus rhythm, heart rate 76, PR interval 280 ms, QRS duration 86 ms, QT 378 ms.  Recent Labs: 06/09/2020: BUN 13; Creat 0.72; Potassium 4.2; Sodium 136  Recent Lipid Panel    Component Value Date/Time   CHOL 146 08/12/2020 0805   TRIG 145 08/12/2020 0805   HDL 46 08/12/2020 0805   CHOLHDL 3.2 08/12/2020 0805   CHOLHDL 2.9 06/09/2020 0828   VLDL 36 (H) 08/06/2016 0825   LDLCALC 75 08/12/2020 0805   LDLCALC 69 06/09/2020 0828    Physical Exam:    VS:  BP 120/88   Pulse 76   Resp 20   Ht '5\' 5"'$  (1.651 m)   Wt 163 lb 6.4 oz (74.1 kg)  SpO2 98%   BMI 27.19 kg/m     Wt Readings from Last 3 Encounters:  12/12/20 163 lb 6.4 oz (74.1 kg)  11/17/20 161 lb (73 kg)  06/12/20 161 lb (73 kg)     GEN:  Well nourished, well developed in no acute distress HEENT: Normal NECK: No JVD; No carotid bruits CARDIAC: RRR, no murmurs, rubs, gallops RESPIRATORY:  Clear to auscultation without rales, wheezing or rhonchi  ABDOMEN: Soft, non-tender, non-distended MUSCULOSKELETAL: No edema; No deformity  SKIN: Warm and dry NEUROLOGIC:  Alert and oriented PSYCHIATRIC:  Normal affect   ASSESSMENT AND PLAN   Myalgias - Possibly statin induced.  This is likely dose-related.  Recommend decreasing Crestor to 10 mg daily.  We always have the option of decreasing daily dose or changing it to every other day dosing until her muscle aches/weakness resolved. - Check CK level and fasting lipids today.  We have not checked fasting lipids since adding Zetia back in May 2022. - Cannot tolerate statins at all, consider Repatha/Praluent.  CAD/ Aortic atherosclerosis - Calcium score on 8/2/221 was 463 (86 percentile).  She reported atypical lower chest/epigastric pain.  Echo 02/04/2020 showed LVEF 70 to 75%, normal RV function, grade 1 diastolic dysfunction, mild MR, recorded as severe pulmonary artery systolic pressure  elevation but pulmonary pressures appear normal.  Lexiscan Myoview on 02/04/2020 showed normal perfusion, EF 76%. - No angina - Continue aspirin and statin therapy.  Palpitations, resolved - Zio patch x14 days on 11/21/2019 showed no significant arrhythmias.  Hyperlipidemia - LDL 76 on 01/29/2020 (Crestor increased from 10 to '20mg'$  daily) LDL 75 in 08/2020 (Zetia added).  Calcium score on 8/2/221 was 463 (86 percentile).   - Check fasting lipids.  Changes to Crestor dosing as above.   Hypertension, well controlled. - Continue losartan 50 mg.  Disposition: Follow-up in January 2023 as already scheduled.  Recommended to call our office if myalgias continue.  Will follow-up on her labs (CK, lipids) that she is getting checked next week.    Medication Adjustments/Labs and Tests Ordered: Current medicines are reviewed at length with the patient today.  Concerns regarding medicines are outlined above.  Orders Placed This Encounter  Procedures   Lipid panel   CK total and CKMB (cardiac)not at [facility]   EKG 12-Lead   Meds ordered this encounter  Medications   rosuvastatin (CRESTOR) 10 MG tablet    Sig: Take 1 tablet (10 mg total) by mouth daily.    Dispense:  90 tablet    Refill:  3    Dose change    Patient Instructions  Medication Instructions:  DECREASE Crestor to 10 mg daily  *If you need a refill on your cardiac medications before your next appointment, please call your pharmacy*  Lab Work: Your physician recommends that you return for lab work at your earliest convenience:  Fasting Lipid Panel-DO NOT EAT OR DRINK PAST MIDNIGHT.  OKAY TO HAVE WATER. CK Total If you have labs (blood work) drawn today and your tests are completely normal, you will receive your results only by: [place] (if you have MyChart) OR A paper copy in the mail If you have any lab test that is abnormal or we need to change your treatment, we will call you to review the  results.  Testing/Procedures: NONE ordered at this time of appointment   Follow-Up: At [facility], you and your health needs are our priority.  As part of our continuing mission to provide you with  exceptional heart care, we have created designated Provider Care Teams.  These Care Teams include your primary Cardiologist (physician) and Advanced Practice Providers (APPs -  Physician Assistants and Nurse Practitioners) who all work together to provide you with the care you need, when you need it.  We recommend signing up for the patient portal called "MyChart".  Sign up information is provided on this After Visit Summary.  MyChart is used to connect with patients for Virtual Visits (Telemedicine).  Patients are able to view lab/test results, encounter notes, upcoming appointments, etc.  Non-urgent messages can be sent to your provider as well.   To learn more about what you can do with MyChart, go to [email or web address].    Your next appointment:   4 month(s)  The format for your next appointment:   In Person  Provider:   Oswaldo Milian, MD  Other Instructions If the muscle ache/pain become persistent or worse please give our office a call.    Signed, Warren Lacy, PA-C  12/12/2020 10:17 AM    [facility]

## 2020-12-12 ENCOUNTER — Encounter: Payer: Self-pay | Admitting: Physician Assistant

## 2020-12-12 ENCOUNTER — Other Ambulatory Visit: Payer: Self-pay

## 2020-12-12 ENCOUNTER — Ambulatory Visit: Payer: Medicare Other | Admitting: Physician Assistant

## 2020-12-12 VITALS — BP 120/88 | HR 76 | Resp 20 | Ht 65.0 in | Wt 163.4 lb

## 2020-12-12 DIAGNOSIS — T466X5A Adverse effect of antihyperlipidemic and antiarteriosclerotic drugs, initial encounter: Secondary | ICD-10-CM | POA: Diagnosis not present

## 2020-12-12 DIAGNOSIS — R002 Palpitations: Secondary | ICD-10-CM

## 2020-12-12 DIAGNOSIS — I10 Essential (primary) hypertension: Secondary | ICD-10-CM

## 2020-12-12 DIAGNOSIS — E785 Hyperlipidemia, unspecified: Secondary | ICD-10-CM | POA: Diagnosis not present

## 2020-12-12 DIAGNOSIS — I2510 Atherosclerotic heart disease of native coronary artery without angina pectoris: Secondary | ICD-10-CM | POA: Diagnosis not present

## 2020-12-12 DIAGNOSIS — M791 Myalgia, unspecified site: Secondary | ICD-10-CM

## 2020-12-12 MED ORDER — ROSUVASTATIN CALCIUM 10 MG PO TABS: 10.0000 mg | ORAL_TABLET | Freq: Every day | ORAL | 3 refills | Status: DC

## 2020-12-12 NOTE — Patient Instructions (Addendum)
Medication Instructions:  DECREASE Crestor to 10 mg daily  *If you need a refill on your cardiac medications before your next appointment, please call your pharmacy*  Lab Work: Your physician recommends that you return for lab work at your earliest convenience:  Fasting Lipid Panel-DO NOT EAT OR DRINK PAST MIDNIGHT.  OKAY TO HAVE WATER. CK Total If you have labs (blood work) drawn today and your tests are completely normal, you will receive your results only by: [place] (if you have MyChart) OR A paper copy in the mail If you have any lab test that is abnormal or we need to change your treatment, we will call you to review the results.  Testing/Procedures: NONE ordered at this time of appointment   Follow-Up: At [facility], you and your health needs are our priority.  As part of our continuing mission to provide you with exceptional heart care, we have created designated Provider Care Teams.  These Care Teams include your primary Cardiologist (physician) and Advanced Practice Providers (APPs -  Physician Assistants and Nurse Practitioners) who all work together to provide you with the care you need, when you need it.  We recommend signing up for the patient portal called "MyChart".  Sign up information is provided on this After Visit Summary.  MyChart is used to connect with patients for Virtual Visits (Telemedicine).  Patients are able to view lab/test results, encounter notes, upcoming appointments, etc.  Non-urgent messages can be sent to your provider as well.   To learn more about what you can do with MyChart, go to [email or web address].    Your next appointment:   4 month(s)  The format for your next appointment:   In Person  Provider:   Oswaldo Milian, MD  Other Instructions If the muscle ache/pain become persistent or worse please give our office a call.

## 2020-12-16 DIAGNOSIS — M9903 Segmental and somatic dysfunction of lumbar region: Secondary | ICD-10-CM | POA: Diagnosis not present

## 2020-12-16 DIAGNOSIS — M5136 Other intervertebral disc degeneration, lumbar region: Secondary | ICD-10-CM | POA: Diagnosis not present

## 2020-12-16 DIAGNOSIS — M9902 Segmental and somatic dysfunction of thoracic region: Secondary | ICD-10-CM | POA: Diagnosis not present

## 2020-12-16 DIAGNOSIS — M5134 Other intervertebral disc degeneration, thoracic region: Secondary | ICD-10-CM | POA: Diagnosis not present

## 2020-12-18 DIAGNOSIS — E785 Hyperlipidemia, unspecified: Secondary | ICD-10-CM | POA: Diagnosis not present

## 2020-12-18 DIAGNOSIS — M791 Myalgia, unspecified site: Secondary | ICD-10-CM | POA: Diagnosis not present

## 2020-12-19 ENCOUNTER — Telehealth: Payer: Self-pay

## 2020-12-19 LAB — LIPID PANEL
Chol/HDL Ratio: 2.6 ratio (ref 0.0–4.4)
Cholesterol, Total: 115 mg/dL (ref 100–199)
HDL: 45 mg/dL (ref >39)
LDL Chol Calc (NIH): 51 mg/dL (ref 0–99)
Triglycerides: 99 mg/dL (ref 0–149)
VLDL Cholesterol Cal: 19 mg/dL (ref 5–40)

## 2020-12-19 LAB — CK TOTAL AND CKMB (NOT AT ARMC)
CK-MB Index: 3.9 ng/mL (ref 0.0–5.3)
Total CK: 179 U/L (ref 32–182)

## 2020-12-19 NOTE — Telephone Encounter (Addendum)
Spoke with patient regrding results.----- Message from Warren Lacy, PA-C sent at 12/19/2020  9:17 AM EDT ----- Your bad cholesterol is at goal (51), less than 70.  Zetia improved your cholesterol.  Hopefully with decreasing your Crestor dose to '10mg'$  daily, we keep your cholesterol below 70 and decrease your muscle aches.    Your CK level is normal meaning there is no evidence of muscle breakdown from your cholesterol medicine.

## 2020-12-25 DIAGNOSIS — M9903 Segmental and somatic dysfunction of lumbar region: Secondary | ICD-10-CM | POA: Diagnosis not present

## 2020-12-25 DIAGNOSIS — M5136 Other intervertebral disc degeneration, lumbar region: Secondary | ICD-10-CM | POA: Diagnosis not present

## 2020-12-25 DIAGNOSIS — M5134 Other intervertebral disc degeneration, thoracic region: Secondary | ICD-10-CM | POA: Diagnosis not present

## 2020-12-25 DIAGNOSIS — M9902 Segmental and somatic dysfunction of thoracic region: Secondary | ICD-10-CM | POA: Diagnosis not present

## 2020-12-30 ENCOUNTER — Other Ambulatory Visit: Payer: Self-pay

## 2020-12-30 ENCOUNTER — Ambulatory Visit: Payer: Medicare Other | Attending: Obstetrics and Gynecology | Admitting: Physical Therapy

## 2020-12-30 DIAGNOSIS — R279 Unspecified lack of coordination: Secondary | ICD-10-CM | POA: Insufficient documentation

## 2020-12-30 DIAGNOSIS — M6281 Muscle weakness (generalized): Secondary | ICD-10-CM | POA: Insufficient documentation

## 2020-12-30 DIAGNOSIS — R2689 Other abnormalities of gait and mobility: Secondary | ICD-10-CM | POA: Insufficient documentation

## 2020-12-30 NOTE — Therapy (Signed)
[facility] [address], [place], [place], [address] Phone: [phone]   Fax:  [phone]  Physical Therapy Treatment  Patient Details  Name: Felicia Hall MRN: [id number] Date of Birth: 02-24-1946 Referring Provider (PT): Jaquita Folds, MD   Encounter Date: 12/30/2020   PT End of Session - 12/30/20 1437     Visit Number 2    Date for PT Re-Evaluation 04/04/21    Authorization Type medicare    Authorization - Visit Number 2    Progress Note Due on Visit 10    PT Start Time 1400    PT Stop Time 1440    PT Time Calculation (min) 40 min    Activity Tolerance Patient tolerated treatment well    Behavior During Therapy [facility] for tasks assessed/performed             Past Medical History:  Diagnosis Date   Acute upper respiratory infections of unspecified site    Allergic rhinitis due to pollen    Benign essential hypertension    Cervical spondylosis without myelopathy    Cervicalgia    Controlled type 2 diabetes mellitus without complication, without long-term current use of insulin ([place]) 10/01/2019   Diaphragmatic hernia without mention of obstruction or gangrene    Disorder of bone and cartilage, unspecified    Diverticulosis of colon (without mention of hemorrhage)    Encounter for long-term (current) use of other medications    GERD (gastroesophageal reflux disease)    History of hiatal hernia    Hyperlipidemia LDL goal < 100    Keratoconjunctivitis sicca, 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's    Other and unspecified hyperlipidemia    Pain in joint, site unspecified    Pre-diabetes    Routine general medical examination at a health care facility    Sjogren's syndrome ([place]) 10/27/2012   Tear film insufficiency, unspecified    Unspecified disorder of kidney and ureter    Unspecified glaucoma(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 Prudencio Burly   CHOLECYSTECTOMY  1993   Dr.Lindsey    DILATION AND CURETTAGE OF UTERUS     LUMBAR LAMINECTOMY/DECOMPRESSION MICRODISCECTOMY N/A 04/21/2017   Procedure: BILATERAL LUMBAR FOUR- LUMBAR FIVE AND LEFT LUMBAR FIVE- SACRAL ONE LAMINOTOMY, MEDIAL FACETECTOMY AND FORAMINOTOMIES;  Surgeon: Jovita Gamma, MD;  Location: [facility];  Service: Neurosurgery;  Laterality: N/A;   NASAL SEPTUM SURGERY      There were no vitals filed for this visit.   Subjective Assessment - 12/30/20 1358     Subjective Pt reports she can make it about an hour now without leakage between voids, up to 4 times per night without leakage.                               [facility] PT Treatment/Exercise - 12/30/20 0001       Self-Care   Self-Care Other Self-Care Comments    Other Self-Care Comments  pt educated on breathing mechanics with all exerciess, voiding mechanics, urge drill, and diaphragmatic breathing for relaxation.      Exercises   Exercises Knee/Hip;Lumbar      Lumbar Exercises: Standing   Other Standing Lumbar Exercises lifting 4# handweight from 2nd stair 2x5 with proper lifting  mechanics and breathing mechanics to decrease back pain and strain at pelvis cues throughout to complete.      Lumbar Exercises: Seated   Sit to Stand 10 reps   2x5   Sit to Stand Limitations bil UE support due to knee pain per pt      Lumbar Exercises: Supine   Clam 20 reps    Clam Limitations red loop with breathing mechanics    Bridge 5 reps   back pain reported by pt at this rep, exercise stopped.   Other Supine Lumbar Exercises ball squeezes 2x10 with breathing mechanics    Other Supine Lumbar Exercises 2x10 TA activations; 2x10 standing palloffs green band; 2x10 rotation palloffs green bands                     PT Education - 12/30/20 1437     Education Details Pt educated on technique throughout  all exercises and proper breathing mechanics to decrease strain on pelvic floor    Person(s) Educated Patient    Methods Explanation;Demonstration;Tactile cues;Verbal cues    Comprehension Verbalized understanding;Returned demonstration              PT Short Term Goals - 12/03/20 1624       PT SHORT TERM GOAL #1   Title Pt to be I with HEP    Time 6    Period Weeks    Status New    Target Date 01/14/21      PT SHORT TERM GOAL #2   Title pt to report no more than 3x urinary voids consistently at night for improved sleep at night    Baseline up to 5 times a night    Time 6    Period Weeks    Status New    Target Date 01/14/21      PT SHORT TERM GOAL #3   Title pt to report urinary voids between 1-2 hours for decreased frequency to improve ability to complete community outings    Time 6    Period Weeks    Status New    Target Date 01/14/21      PT SHORT TERM GOAL #4   Title pt to demonstrate at least 4/5 globally in bil hips for improved mobility in community and improved pelvic stability    Time 6    Period Weeks    Status New    Target Date 01/14/21               PT Long Term Goals - 12/03/20 1626       PT LONG TERM GOAL #1   Title pt to be I with advanced HEP    Time 4    Period Months    Status New    Target Date 04/04/21      PT LONG TERM GOAL #2   Title pt to report no more than 1-2x urinary voids consistently at night for improved sleep at night    Time 4    Period Months    Status New    Target Date 04/04/21      PT LONG TERM GOAL #3   Title pt to demonstrate at least 4+/5 globally in bil hips for improved mobility in community and improved pelvic stability    Time 4    Period Months    Status New    Target Date 04/04/21      PT LONG TERM GOAL #4   Title pt to  report improved ability to correct and maintain proper coordination breathing pattern with core/pelvic floor contract/relax with activity at least 75% of the time    Time 4     Period Months    Status New    Target Date 04/04/21      PT LONG TERM GOAL #5   Title pt to demonstrate at least 3/5 pelvic floor strength for improved ability to prevent urinary leakage    Time 4    Period Months    Status New    Target Date 04/04/21                   Plan - 12/30/20 1438     Clinical Impression Statement Pt presenting to clinic with reports of feeling like she has had some improvement since first session and has began attempting to increase time between voids, use urge drill and decrease just in case pees. Pt able to make it 1 hour without leaks between voids and most of the time 2-3x per night voids without leaks, thinks urge drill is helping. pt reports she wakes up several times a night unrelated to urinary urgency but goes to the bathroom when she wakes out of habit, she is attempting to decrease this with improvement noted. Pt session focused on hip and core strengthening with proper lifting mechanics, breathing mechanics, and technique overall with VC to complete properly. Pt tolerated session well and would benefit from continued PT to address deficits.    Personal Factors and Comorbidities Age;Time since onset of injury/illness/exacerbation    Examination-Activity Limitations Continence;Lift    Examination-Participation Restrictions Community Activity    Stability/Clinical Decision Making Stable/Uncomplicated    Clinical Decision Making Low    Rehab Potential Good    PT Frequency 1x / week    PT Duration --   10 visits   PT Treatment/Interventions ADLs/Self Care Home Management;Aquatic Therapy;Functional mobility training;Therapeutic activities;Therapeutic exercise;Neuromuscular re-education;Manual techniques;Patient/family education;Passive range of motion;Energy conservation;Taping    PT Next Visit Plan go over HEP,, voiding mechanics, breathing mechanics, timed voiding    PT Home Exercise Plan [email or web address]    Consulted and Agree with Plan of Care Patient              Patient will benefit from skilled therapeutic intervention in order to improve the following deficits and impairments:  Increased fascial restricitons, Decreased coordination, Decreased endurance, Decreased mobility, Decreased strength, Postural dysfunction, Improper body mechanics, Impaired flexibility  Visit Diagnosis: Muscle weakness (generalized)  Lack of coordination  Other abnormalities of gait and mobility     Problem List Patient Active Problem List   Diagnosis Date Noted   Controlled type 2 diabetes mellitus without complication, without long-term current use of insulin ([place]) 10/01/2019   Trochanteric bursitis 10/01/2019   Dizziness 10/01/2019   Palpitations 10/01/2019   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   Benign essential tremor 01/03/2017   Irritable bowel syndrome with constipation 08/09/2016   Neck pain, chronic 08/09/2016   Spinal stenosis in cervical region 08/09/2016   Generalized constriction of visual field 06/22/2016   Chronic pansinusitis 10/17/2015   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   Perennial allergic rhinitis 10/17/2015   Chronic pain syndrome 04/30/2013   Hyperglycemia 04/30/2013   Low back pain with right-sided sciatica 04/30/2013   Osteopenia, 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   Family history of colonic polyps 04/30/2013   Sjogren's syndrome ([place]) 10/27/2012   Keratoconjunctivitis sicca, 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's  Encounter for long-term (current) use of other medications    Disorder of bone and cartilage, unspecified    Allergic rhinitis due to pollen    Cervical spondylosis without myelopathy    Unspecified glaucoma(365.9)    Benign essential hypertension    Mixed hyperlipidemia     Stacy Gardner, PT, DPT 09/20/223:39 PM   [facility] [address], [place], [place], [address] Phone:  [phone]   Fax:  [phone]  Name: Felicia Hall MRN: [id number] Date of Birth: 1945-05-10

## 2020-12-30 NOTE — Patient Instructions (Signed)
   [facility] [address], [address] [place], [place] [address] Phone # [phone] Fax [phone]  

## 2021-01-05 ENCOUNTER — Ambulatory Visit: Payer: Medicare Other | Admitting: Physical Therapy

## 2021-01-05 ENCOUNTER — Other Ambulatory Visit: Payer: Self-pay

## 2021-01-05 DIAGNOSIS — M6281 Muscle weakness (generalized): Secondary | ICD-10-CM | POA: Diagnosis not present

## 2021-01-05 DIAGNOSIS — M5136 Other intervertebral disc degeneration, lumbar region: Secondary | ICD-10-CM | POA: Diagnosis not present

## 2021-01-05 DIAGNOSIS — R279 Unspecified lack of coordination: Secondary | ICD-10-CM | POA: Diagnosis not present

## 2021-01-05 DIAGNOSIS — M9902 Segmental and somatic dysfunction of thoracic region: Secondary | ICD-10-CM | POA: Diagnosis not present

## 2021-01-05 DIAGNOSIS — M5134 Other intervertebral disc degeneration, thoracic region: Secondary | ICD-10-CM | POA: Diagnosis not present

## 2021-01-05 DIAGNOSIS — M9903 Segmental and somatic dysfunction of lumbar region: Secondary | ICD-10-CM | POA: Diagnosis not present

## 2021-01-05 DIAGNOSIS — R2689 Other abnormalities of gait and mobility: Secondary | ICD-10-CM | POA: Diagnosis not present

## 2021-01-05 NOTE — Therapy (Signed)
[facility] [address], [place], [place], [address] Phone: [phone]   Fax:  [phone]  Physical Therapy Treatment  Patient Details  Name: Felicia Hall MRN: [id number] Date of Birth: October 18, 1945 Referring Provider (PT): Jaquita Folds, MD   Encounter Date: 01/05/2021   PT End of Session - 01/05/21 1316     Visit Number 3    Date for PT Re-Evaluation 04/04/21    Authorization Type medicare    Authorization - Visit Number 3    Progress Note Due on Visit 10    PT Start Time 1232    PT Stop Time 1314    PT Time Calculation (min) 42 min    Activity Tolerance Patient tolerated treatment well    Behavior During Therapy [facility] for tasks assessed/performed             Past Medical History:  Diagnosis Date   Acute upper respiratory infections of unspecified site    Allergic rhinitis due to pollen    Benign essential hypertension    Cervical spondylosis without myelopathy    Cervicalgia    Controlled type 2 diabetes mellitus without complication, without long-term current use of insulin ([place]) 10/01/2019   Diaphragmatic hernia without mention of obstruction or gangrene    Disorder of bone and cartilage, unspecified    Diverticulosis of colon (without mention of hemorrhage)    Encounter for long-term (current) use of other medications    GERD (gastroesophageal reflux disease)    History of hiatal hernia    Hyperlipidemia LDL goal < 100    Keratoconjunctivitis sicca, 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's    Other and unspecified hyperlipidemia    Pain in joint, site unspecified    Pre-diabetes    Routine general medical examination at a health care facility    Sjogren's syndrome ([place]) 10/27/2012   Tear film insufficiency, unspecified    Unspecified disorder of kidney and ureter    Unspecified glaucoma(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 Prudencio Burly   CHOLECYSTECTOMY  1993   Dr.Lindsey    DILATION AND CURETTAGE OF UTERUS     LUMBAR LAMINECTOMY/DECOMPRESSION MICRODISCECTOMY N/A 04/21/2017   Procedure: BILATERAL LUMBAR FOUR- LUMBAR FIVE AND LEFT LUMBAR FIVE- SACRAL ONE LAMINOTOMY, MEDIAL FACETECTOMY AND FORAMINOTOMIES;  Surgeon: Jovita Gamma, MD;  Location: [facility];  Service: Neurosurgery;  Laterality: N/A;   NASAL SEPTUM SURGERY      There were no vitals filed for this visit.   Subjective Assessment - 01/05/21 1233     Subjective Pt reports she feels the time between voids is increasing without leakage, nighttime voids continue to be ~4x per night. Pt reports with use of voiding mechanics and having her kneels higher than hips her bowel movements have been better with more complete evacuation and less straining.    How long can you sit comfortably? no limits    How long can you stand comfortably? no limits    How long can you walk comfortably? no limits    Patient Stated Goals to have less leakage    Currently in Pain? No/denies                               [facility] Adult PT  Treatment/Exercise - 01/05/21 0001       Exercises   Exercises Lumbar;Knee/Hip      Lumbar Exercises: Aerobic   Nustep 5 mins L2      Lumbar Exercises: Standing   Other Standing Lumbar Exercises palloffs green band 2x15      Lumbar Exercises: Seated   Sit to Stand 10 reps   2x5 with second set with one UE support   Other Seated Lumbar Exercises pelvic tilts on ball with hand support x10 ant/post and Lt/Rt    Other Seated Lumbar Exercises ball sitting with alternating BUE shoulder flexion, alternaing hip flexion x10 each      Knee/Hip Exercises: Supine   Other Supine Knee/Hip Exercises ball squeezes with breathing mechanics cued to exhale during activity 2x10    Other Supine Knee/Hip Exercises hip abd red loop 2x10 breathing mechanics                      PT Education - 01/05/21 1237     Education Details Pt educated on urge drill with increasing time between bladder voids to increase tolerance to at least every 2 hours.    Person(s) Educated Patient    Methods Explanation;Demonstration;Tactile cues;Verbal cues    Comprehension Verbalized understanding;Returned demonstration              PT Short Term Goals - 12/03/20 1624       PT SHORT TERM GOAL #1   Title Pt to be I with HEP    Time 6    Period Weeks    Status New    Target Date 01/14/21      PT SHORT TERM GOAL #2   Title pt to report no more than 3x urinary voids consistently at night for improved sleep at night    Baseline up to 5 times a night    Time 6    Period Weeks    Status New    Target Date 01/14/21      PT SHORT TERM GOAL #3   Title pt to report urinary voids between 1-2 hours for decreased frequency to improve ability to complete community outings    Time 6    Period Weeks    Status New    Target Date 01/14/21      PT SHORT TERM GOAL #4   Title pt to demonstrate at least 4/5 globally in bil hips for improved mobility in community and improved pelvic stability    Time 6    Period Weeks    Status New    Target Date 01/14/21               PT Long Term Goals - 12/03/20 1626       PT LONG TERM GOAL #1   Title pt to be I with advanced HEP    Time 4    Period Months    Status New    Target Date 04/04/21      PT LONG TERM GOAL #2   Title pt to report no more than 1-2x urinary voids consistently at night for improved sleep at night    Time 4    Period Months    Status New    Target Date 04/04/21      PT LONG TERM GOAL #3   Title pt to demonstrate at least 4+/5 globally in bil hips for improved mobility in community and improved pelvic stability    Time 4  Period Months    Status New    Target Date 04/04/21      PT LONG TERM GOAL #4   Title pt to report improved ability to correct and maintain proper  coordination breathing pattern with core/pelvic floor contract/relax with activity at least 75% of the time    Time 4    Period Months    Status New    Target Date 04/04/21      PT LONG TERM GOAL #5   Title pt to demonstrate at least 3/5 pelvic floor strength for improved ability to prevent urinary leakage    Time 4    Period Months    Status New    Target Date 04/04/21                   Plan - 01/05/21 1316     Clinical Impression Statement Pt presents to clinic reporting noted some improvement with bowel movement and urinary leakage symptoms, educated on urge drill technique and when to do and bladder retraining. Pt session focused on core and hip strengthening and breathing mechanics with all activity. Pt tolerated well with rest breaks but limited due to chronic back and hip pain requiring rest breaks and changes of position. Pt tolerated session well and would benefit from continued PT to address deficits.    Personal Factors and Comorbidities Age;Time since onset of injury/illness/exacerbation    Examination-Activity Limitations Continence;Lift    Examination-Participation Restrictions Community Activity    Stability/Clinical Decision Making Stable/Uncomplicated    Clinical Decision Making Low    PT Frequency 1x / week    PT Duration Other (comment)   10 visits   PT Treatment/Interventions ADLs/Self Care Home Management;Aquatic Therapy;Functional mobility training;Therapeutic activities;Therapeutic exercise;Neuromuscular re-education;Manual techniques;Patient/family education;Passive range of motion;Energy conservation;Taping    PT Next Visit Plan breathing mechanics with core and hip strengthening/stretching    PT Home Exercise Plan [id number]    Consulted and Agree with Plan of Care Patient             Patient will benefit from skilled therapeutic intervention in order to improve the following deficits and impairments:  Increased fascial restricitons, Decreased  coordination, Decreased endurance, Decreased mobility, Decreased strength, Postural dysfunction, Improper body mechanics, Impaired flexibility  Visit Diagnosis: Muscle weakness (generalized)  Lack of coordination  Other abnormalities of gait and mobility     Problem List Patient Active Problem List   Diagnosis Date Noted   Controlled type 2 diabetes mellitus without complication, without long-term current use of insulin ([place]) 10/01/2019   Trochanteric bursitis 10/01/2019   Dizziness 10/01/2019   Palpitations 10/01/2019   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   Benign essential tremor 01/03/2017   Irritable bowel syndrome with constipation 08/09/2016   Neck pain, chronic 08/09/2016   Spinal stenosis in cervical region 08/09/2016   Generalized constriction of visual field 06/22/2016   Chronic pansinusitis 10/17/2015   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   Perennial allergic rhinitis 10/17/2015   Chronic pain syndrome 04/30/2013   Hyperglycemia 04/30/2013   Low back pain with right-sided sciatica 04/30/2013   Osteopenia, 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   Family history of colonic polyps 04/30/2013   Sjogren's syndrome ([place]) 10/27/2012   Keratoconjunctivitis sicca, 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's    Encounter for long-term (current) use of other medications    Disorder of bone and cartilage, unspecified    Allergic rhinitis due to pollen    Cervical spondylosis without myelopathy  Unspecified glaucoma(365.9)    Benign essential hypertension    Mixed hyperlipidemia     Stacy Gardner, PT, DPT 09/26/221:19 PM    [facility] [address] [place], [place], [address] Phone: [phone]   Fax:  [phone]  Name: Felicia Hall MRN: [id number] Date of Birth: 11-Apr-1946

## 2021-01-13 ENCOUNTER — Other Ambulatory Visit: Payer: Self-pay

## 2021-01-13 ENCOUNTER — Ambulatory Visit: Payer: Medicare Other | Attending: Obstetrics and Gynecology | Admitting: Physical Therapy

## 2021-01-13 DIAGNOSIS — M5134 Other intervertebral disc degeneration, thoracic region: Secondary | ICD-10-CM | POA: Diagnosis not present

## 2021-01-13 DIAGNOSIS — M6281 Muscle weakness (generalized): Secondary | ICD-10-CM | POA: Diagnosis not present

## 2021-01-13 DIAGNOSIS — R293 Abnormal posture: Secondary | ICD-10-CM | POA: Diagnosis not present

## 2021-01-13 DIAGNOSIS — R252 Cramp and spasm: Secondary | ICD-10-CM | POA: Diagnosis not present

## 2021-01-13 DIAGNOSIS — R279 Unspecified lack of coordination: Secondary | ICD-10-CM

## 2021-01-13 DIAGNOSIS — M5136 Other intervertebral disc degeneration, lumbar region: Secondary | ICD-10-CM | POA: Diagnosis not present

## 2021-01-13 DIAGNOSIS — M9902 Segmental and somatic dysfunction of thoracic region: Secondary | ICD-10-CM | POA: Diagnosis not present

## 2021-01-13 DIAGNOSIS — R2689 Other abnormalities of gait and mobility: Secondary | ICD-10-CM | POA: Insufficient documentation

## 2021-01-13 DIAGNOSIS — M9903 Segmental and somatic dysfunction of lumbar region: Secondary | ICD-10-CM | POA: Diagnosis not present

## 2021-01-13 NOTE — Therapy (Signed)
[facility] @ [place], [place], [address] Phone:     Fax:     Physical Therapy Treatment  Patient Details  Name: Felicia Hall MRN: [id number] Date of Birth: 06-13-1945 Referring Provider (PT): Jaquita Folds, MD   Encounter Date: 01/13/2021   PT End of Session - 01/13/21 1441     Visit Number 4    Date for PT Re-Evaluation 04/04/21    Authorization Type medicare    Authorization - Visit Number 4    Progress Note Due on Visit 10    PT Start Time 1400    PT Stop Time 5277    PT Time Calculation (min) 42 min             Past Medical History:  Diagnosis Date   Acute upper respiratory infections of unspecified site    Allergic rhinitis due to pollen    Benign essential hypertension    Cervical spondylosis without myelopathy    Cervicalgia    Controlled type 2 diabetes mellitus without complication, without long-term current use of insulin ([place]) 10/01/2019   Diaphragmatic hernia without mention of obstruction or gangrene    Disorder of bone and cartilage, unspecified    Diverticulosis of colon (without mention of hemorrhage)    Encounter for long-term (current) use of other medications    GERD (gastroesophageal reflux disease)    History of hiatal hernia    Hyperlipidemia LDL goal < 100    Keratoconjunctivitis sicca, 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's    Other and unspecified hyperlipidemia    Pain in joint, site unspecified    Pre-diabetes    Routine general medical examination at a health care facility    Sjogren's syndrome ([place]) 10/27/2012   Tear film insufficiency, unspecified    Unspecified disorder of kidney and ureter    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 Prudencio Burly   CHOLECYSTECTOMY  1993   Dr.Lindsey    DILATION  AND CURETTAGE OF UTERUS     LUMBAR LAMINECTOMY/DECOMPRESSION MICRODISCECTOMY N/A 04/21/2017   Procedure: BILATERAL LUMBAR FOUR- LUMBAR FIVE AND LEFT LUMBAR FIVE- SACRAL ONE LAMINOTOMY, MEDIAL FACETECTOMY AND FORAMINOTOMIES;  Surgeon: Jovita Gamma, MD;  Location: [facility];  Service: Neurosurgery;  Laterality: N/A;   NASAL SEPTUM SURGERY      There were no vitals filed for this visit.   Subjective Assessment - 01/13/21 1357     Subjective Pt reports she is doing better with frequency and can now go out to grocery store without urinating before during and right after, now only after once needed. Pt now able to urinate every 1.5h without leakage. Pt now only getting up 2x per night. Has not had any leakage episodes lately.    How long can you sit comfortably? no limits    How long can you stand comfortably? no limits    How long can you walk comfortably? no limits    Patient Stated Goals to have less leakage    Currently in Pain? No/denies                               [facility] Adult PT Treatment/Exercise - 01/13/21 0001  Exercises   Exercises Lumbar;Knee/Hip      Lumbar Exercises: Aerobic   Nustep 30mins L3      Lumbar Exercises: Standing   Other Standing Lumbar Exercises standing alternating arm and leg flexion 2x10 with TA activation      Lumbar Exercises: Seated   Sit to Stand 10 reps   2x5 with one UE support   Other Seated Lumbar Exercises seated core activation with downward press on foam roller 2x10 with exhale; forward ball lumbar stretch x10 and x10 with lateral lumbar stretch at ball within pt tolerance; pelvic tilts on ball with hand support x10 ant/post and Lt/Rt    Other Seated Lumbar Exercises seated core activation with red band and exhale with shoulder flexion x10                     PT Education - 01/13/21 1440     Education Details Pt educated on technique with all exercises and breathing mechanics.    Person(s) Educated Patient     Methods Explanation;Demonstration;Tactile cues;Verbal cues    Comprehension Verbalized understanding;Returned demonstration              PT Short Term Goals - 12/03/20 1624       PT SHORT TERM GOAL #1   Title Pt to be I with HEP    Time 6    Period Weeks    Status New    Target Date 01/14/21      PT SHORT TERM GOAL #2   Title pt to report no more than 3x urinary voids consistently at night for improved sleep at night    Baseline up to 5 times a night    Time 6    Period Weeks    Status New    Target Date 01/14/21      PT SHORT TERM GOAL #3   Title pt to report urinary voids between 1-2 hours for decreased frequency to improve ability to complete community outings    Time 6    Period Weeks    Status New    Target Date 01/14/21      PT SHORT TERM GOAL #4   Title pt to demonstrate at least 4/5 globally in bil hips for improved mobility in community and improved pelvic stability    Time 6    Period Weeks    Status New    Target Date 01/14/21               PT Long Term Goals - 12/03/20 1626       PT LONG TERM GOAL #1   Title pt to be I with advanced HEP    Time 4    Period Months    Status New    Target Date 04/04/21      PT LONG TERM GOAL #2   Title pt to report no more than 1-2x urinary voids consistently at night for improved sleep at night    Time 4    Period Months    Status New    Target Date 04/04/21      PT LONG TERM GOAL #3   Title pt to demonstrate at least 4+/5 globally in bil hips for improved mobility in community and improved pelvic stability    Time 4    Period Months    Status New    Target Date 04/04/21      PT LONG TERM GOAL #4   Title pt to report  improved ability to correct and maintain proper coordination breathing pattern with core/pelvic floor contract/relax with activity at least 75% of the time    Time 4    Period Months    Status New    Target Date 04/04/21      PT LONG TERM GOAL #5   Title pt to demonstrate at  least 3/5 pelvic floor strength for improved ability to prevent urinary leakage    Time 4    Period Months    Status New    Target Date 04/04/21                   Plan - 01/13/21 1441     Clinical Impression Statement Pt presents to clinic reporting continued improvement with symptoms with having had no leakage since last visit, using restroom ~2 times per night without leakage, and now able to decrease frequency and urgency to ~1.5 hour voids without leaks and urge drill helpful per pt. Pt session focused on core and hip strengthening with proper breathing mechanics for all exercises to decrease strain on pelvic floor. Pt tolerated well with rest breaks due to fatigue and chronic pain at hips and back though relieved with rest.    Personal Factors and Comorbidities Age;Time since onset of injury/illness/exacerbation    Examination-Activity Limitations Continence;Lift    Examination-Participation Restrictions Community Activity    Stability/Clinical Decision Making Stable/Uncomplicated    Clinical Decision Making Low    Rehab Potential Good    PT Frequency 1x / week    PT Duration Other (comment)    PT Treatment/Interventions ADLs/Self Care Home Management;Aquatic Therapy;Functional mobility training;Therapeutic activities;Therapeutic exercise;Neuromuscular re-education;Manual techniques;Patient/family education;Passive range of motion;Energy conservation;Taping    PT Next Visit Plan breathing mechanics with core and hip strengthening/stretching    PT Home Exercise Plan [id number]    Consulted and Agree with Plan of Care Patient             Patient will benefit from skilled therapeutic intervention in order to improve the following deficits and impairments:  Increased fascial restricitons, Decreased coordination, Decreased endurance, Decreased mobility, Decreased strength, Postural dysfunction, Improper body mechanics, Impaired flexibility  Visit Diagnosis: Lack of  coordination  Muscle weakness (generalized)  Abnormal posture     Problem List Patient Active Problem List   Diagnosis Date Noted   Controlled type 2 diabetes mellitus without complication, without long-term current use of insulin ([place]) 10/01/2019   Trochanteric bursitis 10/01/2019   Dizziness 10/01/2019   Palpitations 10/01/2019   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   Benign essential tremor 01/03/2017   Irritable bowel syndrome with constipation 08/09/2016   Neck pain, chronic 08/09/2016   Spinal stenosis in cervical region 08/09/2016   Generalized constriction of visual field 06/22/2016   Chronic pansinusitis 10/17/2015   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   Perennial allergic rhinitis 10/17/2015   Chronic pain syndrome 04/30/2013   Hyperglycemia 04/30/2013   Low back pain with right-sided sciatica 04/30/2013   Osteopenia, 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   Family history of colonic polyps 04/30/2013   Sjogren's syndrome ([place]) 10/27/2012   Keratoconjunctivitis sicca, 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's    Encounter for long-term (current) use of other medications    Disorder of bone and cartilage, unspecified    Allergic rhinitis due to pollen    Cervical spondylosis without myelopathy    Unspecified glaucoma(365.9)    Benign essential hypertension    Mixed hyperlipidemia    Stacy Gardner,  PT, DPT 10/04/222:52 PM    [facility] @ [place], [place], [address] Phone:     Fax:     Name: Felicia Hall MRN: [id number] Date of Birth: 09-Feb-1946

## 2021-01-15 ENCOUNTER — Ambulatory Visit: Payer: Medicare Other | Attending: Internal Medicine

## 2021-01-15 DIAGNOSIS — Z23 Encounter for immunization: Secondary | ICD-10-CM

## 2021-01-15 NOTE — Progress Notes (Signed)
   Covid-19 Vaccination Clinic  Name:  MALAIKA ARNALL    MRN: [id number] DOB: 01/15/1946  01/15/2021  Ms. Stroh was observed post Covid-19 immunization for 15 minutes without incident. She was provided with Vaccine Information Sheet and instruction to access the V-Safe system.   Ms. Leyba was instructed to call 911 with any severe reactions post vaccine: Difficulty breathing  Swelling of face and throat  A fast heartbeat  A bad rash all over body  Dizziness and weakness

## 2021-01-16 NOTE — Progress Notes (Signed)
[facility] Return Visit  SUBJECTIVE  History of Present Illness: Felicia Hall is a 75 y.o. female seen in follow-up for overactive bladder. Plan at last visit was to start physical therapy.   Has attended 3 PT sessions and is performing exercises at home. Is able to hold the urine a bit longer than usual, can hold urine up to 2 hours . Urinary leakage has improved as well. Had some leakage with sneezing.   Past Medical History: Patient  has a past medical history of Acute upper respiratory infections of unspecified site, Allergic rhinitis due to pollen, Benign essential hypertension, Cervical spondylosis without myelopathy, Cervicalgia, Controlled type 2 diabetes mellitus without complication, without long-term current use of insulin ([place]) (10/01/2019), Diaphragmatic hernia without mention of obstruction or gangrene, Disorder of bone and cartilage, unspecified, Diverticulosis of colon (without mention of hemorrhage), Encounter for long-term (current) use of other medications, GERD (gastroesophageal reflux disease), History of hiatal hernia, Hyperlipidemia LDL goal < 100, Keratoconjunctivitis sicca, 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's, Other and unspecified hyperlipidemia, Pain in joint, site unspecified, Pre-diabetes, Routine general medical examination at a health care facility, Sjogren's syndrome ([place]) (10/27/2012), Tear film insufficiency, unspecified, Unspecified disorder of kidney and ureter, and Unspecified glaucoma(365.9).   Past Surgical History: She  has a past surgical history that includes Cholecystectomy (1993); Cataract extraction w/ intraocular lens  implant, bilateral (Bilateral, 12/2014); Nasal septum surgery; Blepharoptosis repair; Dilation and curettage of uterus; and Lumbar laminectomy/decompression microdiscectomy (N/A, 04/21/2017).   Medications: She has a current medication list which includes the following prescription(s): aspirin ec, brinzolamide, calcium citrate-vitamin  d, cholecalciferol, chromium picolinate, pfizer-biont covid-19 vac-tris, losartan, pantoprazole, rosuvastatin, vibegron, and ezetimibe.   Allergies: Patient is allergic to statins, allopurinol, and darvocet [propoxyphene n-acetaminophen].   Social History: Patient  reports that she has never smoked. She has never used smokeless tobacco. She reports current alcohol use. She reports that she does not use drugs.      OBJECTIVE     Physical Exam: Vitals:   01/19/21 1019  BP: (!) 163/77  Pulse: 94   Gen: No apparent distress, A&O x 3.  Detailed Urogynecologic Evaluation:  Deferred.    ASSESSMENT AND PLAN    Ms. Wolbert is a 75 y.o. with:  1. Overactive bladder   2. SUI (stress urinary incontinence, female)    - Continue with physical therapy - We discussed the option of medication for overactive bladder and she is interested in starting. Not a candidate for anticholinergics due to sjogrens and elevated BP precludes Myrbetriq. Will prescribe Gemtesa 75mg  daily and will likely need prior authorization. If we are unable to get authorization, she is potentially interested in PTNS.   Return 6 weeks for follow up  Jaquita Folds, MD  Time spent: I spent 15 minutes dedicated to the care of this patient on the date of this encounter to include pre-visit review of records, face-to-face time with the patient  and post visit documentation and ordering medication/ testing.

## 2021-01-19 ENCOUNTER — Ambulatory Visit (INDEPENDENT_AMBULATORY_CARE_PROVIDER_SITE_OTHER): Payer: Medicare Other | Admitting: Obstetrics and Gynecology

## 2021-01-19 ENCOUNTER — Other Ambulatory Visit: Payer: Self-pay | Admitting: Obstetrics and Gynecology

## 2021-01-19 ENCOUNTER — Encounter: Payer: Self-pay | Admitting: Obstetrics and Gynecology

## 2021-01-19 ENCOUNTER — Other Ambulatory Visit: Payer: Self-pay

## 2021-01-19 VITALS — BP 163/77 | HR 94

## 2021-01-19 DIAGNOSIS — N393 Stress incontinence (female) (male): Secondary | ICD-10-CM | POA: Diagnosis not present

## 2021-01-19 DIAGNOSIS — N3281 Overactive bladder: Secondary | ICD-10-CM

## 2021-01-19 MED ORDER — VIBEGRON 75 MG PO TABS: 1.0000 | ORAL_TABLET | Freq: Every day | ORAL | 5 refills | Status: DC

## 2021-01-20 ENCOUNTER — Ambulatory Visit: Payer: Medicare Other | Admitting: Physical Therapy

## 2021-01-20 DIAGNOSIS — R293 Abnormal posture: Secondary | ICD-10-CM | POA: Diagnosis not present

## 2021-01-20 DIAGNOSIS — M6281 Muscle weakness (generalized): Secondary | ICD-10-CM | POA: Diagnosis not present

## 2021-01-20 DIAGNOSIS — R252 Cramp and spasm: Secondary | ICD-10-CM | POA: Diagnosis not present

## 2021-01-20 DIAGNOSIS — R2689 Other abnormalities of gait and mobility: Secondary | ICD-10-CM | POA: Diagnosis not present

## 2021-01-20 DIAGNOSIS — R279 Unspecified lack of coordination: Secondary | ICD-10-CM | POA: Diagnosis not present

## 2021-01-20 NOTE — Therapy (Signed)
[facility] @ [place], [place], [address] Phone: [phone]   Fax:  [phone]  Physical Therapy Treatment  Patient Details  Name: Felicia Hall MRN: [id number] Date of Birth: [birth year] Referring Provider (PT): Jaquita Folds, MD   Encounter Date: 01/20/2021   PT End of Session - 01/20/21 1428     Visit Number 5    Date for PT Re-Evaluation 04/04/21    Authorization Type medicare    Authorization - Visit Number 5    Progress Note Due on Visit 10    PT Start Time 1401    PT Stop Time 1440    PT Time Calculation (min) 39 min    Activity Tolerance Patient tolerated treatment well    Behavior During Therapy [facility] for tasks assessed/performed             Past Medical History:  Diagnosis Date   Acute upper respiratory infections of unspecified site    Allergic rhinitis due to pollen    Benign essential hypertension    Cervical spondylosis without myelopathy    Cervicalgia    Controlled type 2 diabetes mellitus without complication, without long-term current use of insulin ([place]) 10/01/2019   Diaphragmatic hernia without mention of obstruction or gangrene    Disorder of bone and cartilage, unspecified    Diverticulosis of colon (without mention of hemorrhage)    Encounter for long-term (current) use of other medications    GERD (gastroesophageal reflux disease)    History of hiatal hernia    Hyperlipidemia LDL goal < 100    Keratoconjunctivitis sicca, 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's    Other and unspecified hyperlipidemia    Pain in joint, site unspecified    Pre-diabetes    Routine general medical examination at a health care facility    Sjogren's syndrome ([place]) 10/27/2012   Tear film insufficiency, unspecified    Unspecified disorder of kidney and ureter    Unspecified glaucoma(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 Prudencio Burly   CHOLECYSTECTOMY  1993   Dr.Lindsey    DILATION AND CURETTAGE OF UTERUS     LUMBAR LAMINECTOMY/DECOMPRESSION MICRODISCECTOMY N/A 04/21/2017   Procedure: BILATERAL LUMBAR FOUR- LUMBAR FIVE AND LEFT LUMBAR FIVE- SACRAL ONE LAMINOTOMY, MEDIAL FACETECTOMY AND FORAMINOTOMIES;  Surgeon: Jovita Gamma, MD;  Location: [facility];  Service: Neurosurgery;  Laterality: N/A;   NASAL SEPTUM SURGERY      There were no vitals filed for this visit.   Subjective Assessment - 01/20/21 1405     Subjective Pt reports she feels she is getting better with leakage overall. Generally reports she is able to maintain 1.5 hours between voids on most days but reports she does have bad days where she voids more frequently. And on bad days voiding every 1 hour. Pt saw Dr. Wannetta Sender and was prescribed a new medication for urgency and leakage; continues to get up 2x per night. Did have two instances of sneezing which causes very minor amount of leakage.    How long can you sit comfortably? no limits    How long can you stand comfortably? no limits    How long can you walk comfortably? no limits    Patient Stated Goals to have less leakage    Currently in Pain?  No/denies                               [facility] Adult PT Treatment/Exercise - 01/20/21 0001       Self-Care   Self-Care Other Self-Care Comments    Other Self-Care Comments  Pt educated on bladder diary, bladder retraining, and kegel HEP      Exercises   Exercises Knee/Hip;Lumbar      Lumbar Exercises: Stretches   Other Lumbar Stretch Exercise seated ball roll forward and bilaterally 5x10s      Lumbar Exercises: Aerobic   Nustep 35mins L5                     PT Education - 01/20/21 1422     Education Details Pt educated on bladder diary, kegel HEP.    Person(s) Educated Patient    Methods Explanation;Demonstration;Tactile cues;Verbal  cues;Handout    Comprehension Verbalized understanding;Returned demonstration              PT Short Term Goals - 12/03/20 1624       PT SHORT TERM GOAL #1   Title Pt to be I with HEP    Time 6    Period Weeks    Status New    Target Date 01/14/21      PT SHORT TERM GOAL #2   Title pt to report no more than 3x urinary voids consistently at night for improved sleep at night    Baseline up to 5 times a night    Time 6    Period Weeks    Status New    Target Date 01/14/21      PT SHORT TERM GOAL #3   Title pt to report urinary voids between 1-2 hours for decreased frequency to improve ability to complete community outings    Time 6    Period Weeks    Status New    Target Date 01/14/21      PT SHORT TERM GOAL #4   Title pt to demonstrate at least 4/5 globally in bil hips for improved mobility in community and improved pelvic stability    Time 6    Period Weeks    Status New    Target Date 01/14/21               PT Long Term Goals - 12/03/20 1626       PT LONG TERM GOAL #1   Title pt to be I with advanced HEP    Time 4    Period Months    Status New    Target Date 04/04/21      PT LONG TERM GOAL #2   Title pt to report no more than 1-2x urinary voids consistently at night for improved sleep at night    Time 4    Period Months    Status New    Target Date 04/04/21      PT LONG TERM GOAL #3   Title pt to demonstrate at least 4+/5 globally in bil hips for improved mobility in community and improved pelvic stability    Time 4    Period Months    Status New    Target Date 04/04/21      PT LONG TERM GOAL #4   Title pt to report improved ability to correct and maintain proper coordination breathing pattern with core/pelvic floor contract/relax with activity at least 75%  of the time    Time 4    Period Months    Status New    Target Date 04/04/21      PT LONG TERM GOAL #5   Title pt to demonstrate at least 3/5 pelvic floor strength for improved  ability to prevent urinary leakage    Time 4    Period Months    Status New    Target Date 04/04/21                   Plan - 01/20/21 1428     Clinical Impression Statement Pt presents to clinic reporting continued progress overall but still feels she is going ~1.5 hours between bladder voids, and around 2 times per night. Pt reports no leakage at all except x2 minor leaks with sneezing. Pt session foucsed on educated for bladder retraining, kegel HEP, and bladder diary, lumbar stretching, hip and core strengthening with proper breathing technique throughout. Pt tolerated well with minor limitations with bil knee pain with sit<>stands Rt>Lt per pt. Pt required rest breaks throughout. Pt would benefit from continued PT for improved mobility, strength, tolerance to activity, decreased urgency, frequency and decreased urinary leakage.    Personal Factors and Comorbidities Age;Time since onset of injury/illness/exacerbation    Examination-Activity Limitations Continence;Lift    Examination-Participation Restrictions Community Activity    Stability/Clinical Decision Making Stable/Uncomplicated    Clinical Decision Making Low    Rehab Potential Good    PT Frequency 1x / week    PT Duration Other (comment)    PT Treatment/Interventions ADLs/Self Care Home Management;Aquatic Therapy;Functional mobility training;Therapeutic activities;Therapeutic exercise;Neuromuscular re-education;Manual techniques;Patient/family education;Passive range of motion;Energy conservation;Taping    PT Next Visit Plan breathing mechanics with core and hip strengthening/stretching    PT Home Exercise Plan [id number]    Consulted and Agree with Plan of Care Patient             Patient will benefit from skilled therapeutic intervention in order to improve the following deficits and impairments:  Increased fascial restricitons, Decreased coordination, Decreased endurance, Decreased mobility, Decreased strength,  Postural dysfunction, Improper body mechanics, Impaired flexibility  Visit Diagnosis: Other abnormalities of gait and mobility  Abnormal posture  Muscle weakness (generalized)     Problem List Patient Active Problem List   Diagnosis Date Noted   Controlled type 2 diabetes mellitus without complication, without long-term current use of insulin ([place]) 10/01/2019   Trochanteric bursitis 10/01/2019   Dizziness 10/01/2019   Palpitations 10/01/2019   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   Benign essential tremor 01/03/2017   Irritable bowel syndrome with constipation 08/09/2016   Neck pain, chronic 08/09/2016   Spinal stenosis in cervical region 08/09/2016   Generalized constriction of visual field 06/22/2016   Chronic pansinusitis 10/17/2015   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   Perennial allergic rhinitis 10/17/2015   Chronic pain syndrome 04/30/2013   Hyperglycemia 04/30/2013   Low back pain with right-sided sciatica 04/30/2013   Osteopenia, 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   Family history of colonic polyps 04/30/2013   Sjogren's syndrome ([place]) 10/27/2012   Keratoconjunctivitis sicca, 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's    Encounter for long-term (current) use of other medications    Disorder of bone and cartilage, unspecified    Allergic rhinitis due to pollen    Cervical spondylosis without myelopathy    Unspecified glaucoma(365.9)    Benign essential hypertension    Mixed hyperlipidemia     Stacy Gardner,  PT, DPT 10/11/222:42 PM    [facility] @ [place], [place], [address] Phone: [phone]   Fax:  [phone]  Name: Felicia Hall MRN: [id number] Date of Birth: Dec 26, 1945

## 2021-01-23 ENCOUNTER — Other Ambulatory Visit (HOSPITAL_BASED_OUTPATIENT_CLINIC_OR_DEPARTMENT_OTHER): Payer: Self-pay

## 2021-01-23 MED ORDER — COVID-19MRNA BIVAL VACC PFIZER 30 MCG/0.3ML IM SUSP
INTRAMUSCULAR | 0 refills | Status: DC
  Filled 2021-01-23: qty 0.3, 1d supply, fill #0

## 2021-01-27 ENCOUNTER — Other Ambulatory Visit: Payer: Self-pay

## 2021-01-27 ENCOUNTER — Encounter: Payer: Self-pay | Admitting: Physical Therapy

## 2021-01-27 ENCOUNTER — Ambulatory Visit: Payer: Medicare Other | Admitting: Physical Therapy

## 2021-01-27 DIAGNOSIS — R293 Abnormal posture: Secondary | ICD-10-CM | POA: Diagnosis not present

## 2021-01-27 DIAGNOSIS — M5136 Other intervertebral disc degeneration, lumbar region: Secondary | ICD-10-CM | POA: Diagnosis not present

## 2021-01-27 DIAGNOSIS — M6281 Muscle weakness (generalized): Secondary | ICD-10-CM | POA: Diagnosis not present

## 2021-01-27 DIAGNOSIS — R279 Unspecified lack of coordination: Secondary | ICD-10-CM

## 2021-01-27 DIAGNOSIS — R252 Cramp and spasm: Secondary | ICD-10-CM

## 2021-01-27 DIAGNOSIS — R2689 Other abnormalities of gait and mobility: Secondary | ICD-10-CM | POA: Diagnosis not present

## 2021-01-27 DIAGNOSIS — M5134 Other intervertebral disc degeneration, thoracic region: Secondary | ICD-10-CM | POA: Diagnosis not present

## 2021-01-27 DIAGNOSIS — M9902 Segmental and somatic dysfunction of thoracic region: Secondary | ICD-10-CM | POA: Diagnosis not present

## 2021-01-27 DIAGNOSIS — M9903 Segmental and somatic dysfunction of lumbar region: Secondary | ICD-10-CM | POA: Diagnosis not present

## 2021-01-27 NOTE — Therapy (Signed)
[facility] @ [place], [place], [address] Phone: [phone]   Fax:  [phone]  Physical Therapy Treatment  Patient Details  Name: Felicia Hall MRN: [id number] Date of Birth: 03-16-1946 Referring Provider (PT): Jaquita Folds, MD   Encounter Date: 01/27/2021   PT End of Session - 01/27/21 1439     Visit Number 6    Date for PT Re-Evaluation 04/04/21    Authorization Type medicare    Authorization - Visit Number 6    Progress Note Due on Visit 10    PT Start Time 1400    PT Stop Time 1443    PT Time Calculation (min) 43 min    Activity Tolerance Patient tolerated treatment well    Behavior During Therapy [facility] for tasks assessed/performed             Past Medical History:  Diagnosis Date   Acute upper respiratory infections of unspecified site    Allergic rhinitis due to pollen    Benign essential hypertension    Cervical spondylosis without myelopathy    Cervicalgia    Controlled type 2 diabetes mellitus without complication, without long-term current use of insulin ([place]) 10/01/2019   Diaphragmatic hernia without mention of obstruction or gangrene    Disorder of bone and cartilage, unspecified    Diverticulosis of colon (without mention of hemorrhage)    Encounter for long-term (current) use of other medications    GERD (gastroesophageal reflux disease)    History of hiatal hernia    Hyperlipidemia LDL goal < 100    Keratoconjunctivitis sicca, 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's    Other and unspecified hyperlipidemia    Pain in joint, site unspecified    Pre-diabetes    Routine general medical examination at a health care facility    Sjogren's syndrome ([place]) 10/27/2012   Tear film insufficiency, unspecified    Unspecified disorder of kidney and ureter    Unspecified glaucoma(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 Prudencio Burly   CHOLECYSTECTOMY  1993   Dr.Lindsey    DILATION AND CURETTAGE OF UTERUS     LUMBAR LAMINECTOMY/DECOMPRESSION MICRODISCECTOMY N/A 04/21/2017   Procedure: BILATERAL LUMBAR FOUR- LUMBAR FIVE AND LEFT LUMBAR FIVE- SACRAL ONE LAMINOTOMY, MEDIAL FACETECTOMY AND FORAMINOTOMIES;  Surgeon: Jovita Gamma, MD;  Location: [facility];  Service: Neurosurgery;  Laterality: N/A;   NASAL SEPTUM SURGERY      There were no vitals filed for this visit.   Subjective Assessment - 01/27/21 1358     Subjective Pt reports "I'm doing a little better", now able to make it ~2 hours between voids no leakage between, ~2-3x per night. Pt reports no leakage since last visit.    How long can you sit comfortably? no limits    How long can you stand comfortably? no limits    How long can you walk comfortably? no limits    Patient Stated Goals to have less leakage    Currently in Pain? No/denies   none currently but pt reports back tightness/cramping and limits posture                              [facility] Adult PT Treatment/Exercise - 01/27/21 0001  Self-Care   Self-Care Other Self-Care Comments    Other Self-Care Comments  Pt educated on bladder retraining, attempting to decrease water intake during the night between voids but pt is limited in this due to Sjogren's.      Lumbar Exercises: Stretches   Other Lumbar Stretch Exercise seated ball roll forward and bilaterally 10x10s    Other Lumbar Stretch Exercise seated thoracic stretch with rotation 10x10s each      Manual Therapy   Manual Therapy Soft tissue mobilization;Myofascial release    Manual therapy comments manual work with pt in prone at thoracic and lumbar paraspinals with increased muslce tightness noted at Rt paraspinal compared to Lt which released with manual work and x3 large trigger points in thoracic bil paraspinals with released. Pt  reported feeling much better and able to tolerate mobility more                     PT Education - 01/27/21 1436     Education Details Pt educated on lumbar stretching and breathing mechanics and continued bladder retraining.    Person(s) Educated Patient    Methods Explanation;Demonstration;Tactile cues;Verbal cues    Comprehension Verbalized understanding;Returned demonstration              PT Short Term Goals - 12/03/20 1624       PT SHORT TERM GOAL #1   Title Pt to be I with HEP    Time 6    Period Weeks    Status New    Target Date 01/14/21      PT SHORT TERM GOAL #2   Title pt to report no more than 3x urinary voids consistently at night for improved sleep at night    Baseline up to 5 times a night    Time 6    Period Weeks    Status New    Target Date 01/14/21      PT SHORT TERM GOAL #3   Title pt to report urinary voids between 1-2 hours for decreased frequency to improve ability to complete community outings    Time 6    Period Weeks    Status New    Target Date 01/14/21      PT SHORT TERM GOAL #4   Title pt to demonstrate at least 4/5 globally in bil hips for improved mobility in community and improved pelvic stability    Time 6    Period Weeks    Status New    Target Date 01/14/21               PT Long Term Goals - 12/03/20 1626       PT LONG TERM GOAL #1   Title pt to be I with advanced HEP    Time 4    Period Months    Status New    Target Date 04/04/21      PT LONG TERM GOAL #2   Title pt to report no more than 1-2x urinary voids consistently at night for improved sleep at night    Time 4    Period Months    Status New    Target Date 04/04/21      PT LONG TERM GOAL #3   Title pt to demonstrate at least 4+/5 globally in bil hips for improved mobility in community and improved pelvic stability    Time 4    Period Months    Status New    Target Date  04/04/21      PT LONG TERM GOAL #4   Title pt to report improved  ability to correct and maintain proper coordination breathing pattern with core/pelvic floor contract/relax with activity at least 75% of the time    Time 4    Period Months    Status New    Target Date 04/04/21      PT LONG TERM GOAL #5   Title pt to demonstrate at least 3/5 pelvic floor strength for improved ability to prevent urinary leakage    Time 4    Period Months    Status New    Target Date 04/04/21                   Plan - 01/27/21 1445     Clinical Impression Statement Pt presents to clinic reporting improvement with daily voids with now able to hold ~2 hours between voids without leakage and around 2-3 times per night though pt is limited due to Sjogren's and needs to drink water throughout the night between voids. Pt educated to attempt maybe smaller amounts of liquid bewteen voids and pt agrees to attempt. Pt session focused on lumbar/thoracic manual and mobility to decrease pain and tightness which limits posture and mobility. Pt would benefit from continued PT for improved mobility, strength, tolerance to activity, decreased urgency, frequency and decreased urinary leakage.    Personal Factors and Comorbidities Age;Time since onset of injury/illness/exacerbation    Examination-Activity Limitations Continence;Lift    Examination-Participation Restrictions Community Activity    Stability/Clinical Decision Making Stable/Uncomplicated    Rehab Potential Good    PT Frequency 1x / week    PT Duration Other (comment)    PT Treatment/Interventions ADLs/Self Care Home Management;Aquatic Therapy;Functional mobility training;Therapeutic activities;Therapeutic exercise;Neuromuscular re-education;Manual techniques;Patient/family education;Passive range of motion;Energy conservation;Taping    PT Next Visit Plan breathing mechanics with core and hip strengthening/stretching    PT Home Exercise Plan [email or web address]    Consulted and Agree with Plan of Care Patient              Patient will benefit from skilled therapeutic intervention in order to improve the following deficits and impairments:  Increased fascial restricitons, Decreased coordination, Decreased endurance, Decreased mobility, Decreased strength, Postural dysfunction, Improper body mechanics, Impaired flexibility  Visit Diagnosis: Muscle weakness (generalized)  Lack of coordination  Cramp and spasm     Problem List Patient Active Problem List   Diagnosis Date Noted   Controlled type 2 diabetes mellitus without complication, without long-term current use of insulin ([place]) 10/01/2019   Trochanteric bursitis 10/01/2019   Dizziness 10/01/2019   Palpitations 10/01/2019   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   Benign essential tremor 01/03/2017   Irritable bowel syndrome with constipation 08/09/2016   Neck pain, chronic 08/09/2016   Spinal stenosis in cervical region 08/09/2016   Generalized constriction of visual field 06/22/2016   Chronic pansinusitis 10/17/2015   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   Perennial allergic rhinitis 10/17/2015   Chronic pain syndrome 04/30/2013   Hyperglycemia 04/30/2013   Low back pain with right-sided sciatica 04/30/2013   Osteopenia, 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   Family history of colonic polyps 04/30/2013   Sjogren's syndrome ([place]) 10/27/2012   Keratoconjunctivitis sicca, 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's    Encounter for long-term (current) use of other medications    Disorder of bone and cartilage, unspecified    Allergic rhinitis due to pollen    Cervical spondylosis without  myelopathy    Unspecified glaucoma(365.9)    Benign essential hypertension    Mixed hyperlipidemia     Stacy Gardner, PT, DPT 10/18/222:56 PM   [facility] @ [place], [place], [address] Phone: [phone]   Fax:  [phone]  Name:  REGINA GANCI MRN: [id number] Date of Birth: 1945-06-17

## 2021-02-02 DIAGNOSIS — H04122 Dry eye syndrome of left lacrimal gland: Secondary | ICD-10-CM | POA: Diagnosis not present

## 2021-02-02 DIAGNOSIS — H401132 Primary open-angle glaucoma, bilateral, moderate stage: Secondary | ICD-10-CM | POA: Diagnosis not present

## 2021-02-03 ENCOUNTER — Ambulatory Visit: Payer: Medicare Other | Admitting: Physical Therapy

## 2021-02-03 ENCOUNTER — Other Ambulatory Visit: Payer: Self-pay

## 2021-02-03 DIAGNOSIS — M6281 Muscle weakness (generalized): Secondary | ICD-10-CM

## 2021-02-03 DIAGNOSIS — R252 Cramp and spasm: Secondary | ICD-10-CM | POA: Diagnosis not present

## 2021-02-03 DIAGNOSIS — R279 Unspecified lack of coordination: Secondary | ICD-10-CM | POA: Diagnosis not present

## 2021-02-03 DIAGNOSIS — R2689 Other abnormalities of gait and mobility: Secondary | ICD-10-CM | POA: Diagnosis not present

## 2021-02-03 DIAGNOSIS — R293 Abnormal posture: Secondary | ICD-10-CM

## 2021-02-03 NOTE — Therapy (Signed)
[facility] @ [place], [place], [address] Phone: [phone]   Fax:  [phone]  Physical Therapy Treatment  Patient Details  Name: Felicia Hall MRN: [id number] Date of Birth: [birth year] Referring Provider (PT): Jaquita Folds, MD   Encounter Date: 02/03/2021   PT End of Session - 02/03/21 1407     Visit Number 7    Date for PT Re-Evaluation 04/04/21    Authorization Type medicare    Authorization - Visit Number 6    Progress Note Due on Visit 10    PT Start Time 1401    PT Stop Time 1444    PT Time Calculation (min) 43 min    Activity Tolerance Patient tolerated treatment well    Behavior During Therapy [facility] for tasks assessed/performed             Past Medical History:  Diagnosis Date   Acute upper respiratory infections of unspecified site    Allergic rhinitis due to pollen    Benign essential hypertension    Cervical spondylosis without myelopathy    Cervicalgia    Controlled type 2 diabetes mellitus without complication, without long-term current use of insulin ([place]) 10/01/2019   Diaphragmatic hernia without mention of obstruction or gangrene    Disorder of bone and cartilage, unspecified    Diverticulosis of colon (without mention of hemorrhage)    Encounter for long-term (current) use of other medications    GERD (gastroesophageal reflux disease)    History of hiatal hernia    Hyperlipidemia LDL goal < 100    Keratoconjunctivitis sicca, 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's    Other and unspecified hyperlipidemia    Pain in joint, site unspecified    Pre-diabetes    Routine general medical examination at a health care facility    Sjogren's syndrome ([place]) 10/27/2012   Tear film insufficiency, unspecified    Unspecified disorder of kidney and ureter    Unspecified glaucoma(365.9)     Past Surgical History:  Procedure Laterality Date   BELPHAROPTOSIS REPAIR     brow lift   bil   CATARACT  EXTRACTION W/ INTRAOCULAR LENS  IMPLANT, BILATERAL Bilateral 12/2014   Dr. Prudencio Burly   CHOLECYSTECTOMY  1993   Dr.Lindsey    DILATION AND CURETTAGE OF UTERUS     LUMBAR LAMINECTOMY/DECOMPRESSION MICRODISCECTOMY N/A 04/21/2017   Procedure: BILATERAL LUMBAR FOUR- LUMBAR FIVE AND LEFT LUMBAR FIVE- SACRAL ONE LAMINOTOMY, MEDIAL FACETECTOMY AND FORAMINOTOMIES;  Surgeon: Jovita Gamma, MD;  Location: [facility];  Service: Neurosurgery;  Laterality: N/A;   NASAL SEPTUM SURGERY      There were no vitals filed for this visit.   Subjective Assessment - 02/03/21 1404     Subjective Pt reports she completed bladder diary but forgot it at home, pt reports on days she is out doing things she is able to make it ~2 hours without leaks between urinations however at home a little more frequently. Pt has been sneezing a lot with allergies lately and has not had any leaks with these.    How long can you sit comfortably? no limits    How long can you stand comfortably? no limits    How long can you walk comfortably? no limits    Patient Stated Goals to have less leakage    Currently in Pain? No/denies  Pelvic Floor Special Questions - 02/03/21 0001     Pelvic Floor Internal Exam deferred               [facility] Adult PT Treatment/Exercise - 02/03/21 0001       Exercises   Exercises Lumbar;Knee/Hip      Lumbar Exercises: Stretches   Other Lumbar Stretch Exercise standing needle threaders 2x30s each at counter; lateral trunk stretches on peanut ball x30s each      Lumbar Exercises: Seated   Sit to Stand 20 reps    Sit to Stand Limitations 2x10    Other Seated Lumbar Exercises core contractions into standing foam roller in sitting 2x10x5s; forward ball lumbar stretch x10 and x10 with lateral lumbar stretch at ball within pt tolerance;    Other Seated Lumbar Exercises seated black loop 2x10 hip flexion, abduction; shoulder horizontal abduction red band x10 with VC  for breathing technique and core activation      Manual Therapy   Manual Therapy Soft tissue mobilization;Myofascial release    Manual therapy comments manual work with pt in prone at thoracic  and scauplar region with x2 moderate trigger points noted and increased muslce tightness noted at Rt compared to Lt which released with manual work. Pt reported feeling much better and able to tolerate mobility without back pain                     PT Education - 02/03/21 1427     Education Details Pt educated on continued urge drill to increase time between voids and bladder retraining and increasing reps of kegels at home 3x20 during the day (am, lunch, pm)    Person(s) Educated Patient    Methods Explanation;Demonstration;Tactile cues;Verbal cues    Comprehension Verbalized understanding;Returned demonstration              PT Short Term Goals - 12/03/20 1624       PT SHORT TERM GOAL #1   Title Pt to be I with HEP    Time 6    Period Weeks    Status New    Target Date 01/14/21      PT SHORT TERM GOAL #2   Title pt to report no more than 3x urinary voids consistently at night for improved sleep at night    Baseline up to 5 times a night    Time 6    Period Weeks    Status New    Target Date 01/14/21      PT SHORT TERM GOAL #3   Title pt to report urinary voids between 1-2 hours for decreased frequency to improve ability to complete community outings    Time 6    Period Weeks    Status New    Target Date 01/14/21      PT SHORT TERM GOAL #4   Title pt to demonstrate at least 4/5 globally in bil hips for improved mobility in community and improved pelvic stability    Time 6    Period Weeks    Status New    Target Date 01/14/21               PT Long Term Goals - 12/03/20 1626       PT LONG TERM GOAL #1   Title pt to be I with advanced HEP    Time 4    Period Months    Status New    Target Date 04/04/21  PT LONG TERM GOAL #2   Title pt to report  no more than 1-2x urinary voids consistently at night for improved sleep at night    Time 4    Period Months    Status New    Target Date 04/04/21      PT LONG TERM GOAL #3   Title pt to demonstrate at least 4+/5 globally in bil hips for improved mobility in community and improved pelvic stability    Time 4    Period Months    Status New    Target Date 04/04/21      PT LONG TERM GOAL #4   Title pt to report improved ability to correct and maintain proper coordination breathing pattern with core/pelvic floor contract/relax with activity at least 75% of the time    Time 4    Period Months    Status New    Target Date 04/04/21      PT LONG TERM GOAL #5   Title pt to demonstrate at least 3/5 pelvic floor strength for improved ability to prevent urinary leakage    Time 4    Period Months    Status New    Target Date 04/04/21                   Plan - 02/03/21 1407     Clinical Impression Statement Pt presents with progress reported with continued no leakage and continued ~2 hours between voids but able to have less urge as well. Pt tolerated session well, educated on increased kegel HEP for home, continued bladder retraining. Pt limited due to Sjogren's and needs to drink water throughout the night between voids. Pt session focused on core and hip strengthening with proper breathing mechanics and manual work briefly at [employer] upper thoracic/scapula region with x2 trigger points noted and released and decreased pt's pain in back and increased tolerance to therapy. Pt agreeable to attempt x2 weeks between next session to see how she does before discharge. Pt would benefit from continued PT for improved mobility, strength, tolerance to activity, decreased urgency, frequency and decreased urinary leakage.    Personal Factors and Comorbidities Age;Time since onset of injury/illness/exacerbation    Examination-Activity Limitations Continence;Lift    Examination-Participation Restrictions  Community Activity    Stability/Clinical Decision Making Stable/Uncomplicated    Rehab Potential Good    PT Frequency 1x / week    PT Duration Other (comment)    PT Treatment/Interventions ADLs/Self Care Home Management;Aquatic Therapy;Functional mobility training;Therapeutic activities;Therapeutic exercise;Neuromuscular re-education;Manual techniques;Patient/family education;Passive range of motion;Energy conservation;Taping    PT Next Visit Plan breathing mechanics with core and hip strengthening/stretching    PT Home Exercise Plan [id number]    Consulted and Agree with Plan of Care Patient             Patient will benefit from skilled therapeutic intervention in order to improve the following deficits and impairments:  Increased fascial restricitons, Decreased coordination, Decreased endurance, Decreased mobility, Decreased strength, Postural dysfunction, Improper body mechanics, Impaired flexibility  Visit Diagnosis: Muscle weakness (generalized)  Abnormal posture  Lack of coordination     Problem List Patient Active Problem List   Diagnosis Date Noted   Controlled type 2 diabetes mellitus without complication, without long-term current use of insulin ([place]) 10/01/2019   Trochanteric bursitis 10/01/2019   Dizziness 10/01/2019   Palpitations 10/01/2019   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   Benign essential tremor 01/03/2017  Irritable bowel syndrome with constipation 08/09/2016   Neck pain, chronic 08/09/2016   Spinal stenosis in cervical region 08/09/2016   Generalized constriction of visual field 06/22/2016   Chronic pansinusitis 10/17/2015   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   Perennial allergic rhinitis 10/17/2015   Chronic pain syndrome 04/30/2013   Hyperglycemia 04/30/2013   Low back pain with right-sided sciatica 04/30/2013   Osteopenia, 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   Family history of  colonic polyps 04/30/2013   Sjogren's syndrome ([place]) 10/27/2012   Keratoconjunctivitis sicca, 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's    Encounter for long-term (current) use of other medications    Disorder of bone and cartilage, unspecified    Allergic rhinitis due to pollen    Cervical spondylosis without myelopathy    Unspecified glaucoma(365.9)    Benign essential hypertension    Mixed hyperlipidemia     Stacy Gardner, PT, DPT 10/25/222:48 PM   [facility] @ [place], [place], [address] Phone: [phone]   Fax:  [phone]  Name: Felicia Hall MRN: [id number] Date of Birth: [birth year]

## 2021-02-05 DIAGNOSIS — M9903 Segmental and somatic dysfunction of lumbar region: Secondary | ICD-10-CM | POA: Diagnosis not present

## 2021-02-05 DIAGNOSIS — M5134 Other intervertebral disc degeneration, thoracic region: Secondary | ICD-10-CM | POA: Diagnosis not present

## 2021-02-05 DIAGNOSIS — M9902 Segmental and somatic dysfunction of thoracic region: Secondary | ICD-10-CM | POA: Diagnosis not present

## 2021-02-05 DIAGNOSIS — M5136 Other intervertebral disc degeneration, lumbar region: Secondary | ICD-10-CM | POA: Diagnosis not present

## 2021-02-10 ENCOUNTER — Encounter: Payer: Medicare Other | Admitting: Physical Therapy

## 2021-02-12 ENCOUNTER — Other Ambulatory Visit: Payer: Self-pay

## 2021-02-12 DIAGNOSIS — N3281 Overactive bladder: Secondary | ICD-10-CM

## 2021-02-17 ENCOUNTER — Other Ambulatory Visit: Payer: Self-pay

## 2021-02-17 ENCOUNTER — Ambulatory Visit: Payer: Medicare Other | Attending: Obstetrics and Gynecology | Admitting: Physical Therapy

## 2021-02-17 ENCOUNTER — Encounter: Payer: Self-pay | Admitting: Physical Therapy

## 2021-02-17 DIAGNOSIS — M9903 Segmental and somatic dysfunction of lumbar region: Secondary | ICD-10-CM | POA: Diagnosis not present

## 2021-02-17 DIAGNOSIS — M6281 Muscle weakness (generalized): Secondary | ICD-10-CM | POA: Diagnosis not present

## 2021-02-17 DIAGNOSIS — M5136 Other intervertebral disc degeneration, lumbar region: Secondary | ICD-10-CM | POA: Diagnosis not present

## 2021-02-17 DIAGNOSIS — M9904 Segmental and somatic dysfunction of sacral region: Secondary | ICD-10-CM | POA: Diagnosis not present

## 2021-02-17 DIAGNOSIS — M9905 Segmental and somatic dysfunction of pelvic region: Secondary | ICD-10-CM | POA: Diagnosis not present

## 2021-02-17 DIAGNOSIS — R279 Unspecified lack of coordination: Secondary | ICD-10-CM | POA: Diagnosis not present

## 2021-02-17 NOTE — Therapy (Addendum)
[place] @ [place], [place], [address] Phone: [phone]   Fax:  [phone]  Physical Therapy Treatment  Patient Details  Name: Felicia Hall MRN: [id number] Date of Birth: 31-Jan-1946 Referring Provider (PT): Jaquita Folds, MD   Encounter Date: 02/17/2021   PT End of Session - 02/17/21 1408     Visit Number 8    Date for PT Re-Evaluation 04/04/21    Authorization Type medicare    Authorization - Visit Number 8    Progress Note Due on Visit 10    PT Start Time 1400    PT Stop Time 1440    PT Time Calculation (min) 40 min    Activity Tolerance Patient tolerated treatment well    Behavior During Therapy [facility] for tasks assessed/performed             Past Medical History:  Diagnosis Date   Acute upper respiratory infections of unspecified site    Allergic rhinitis due to pollen    Benign essential hypertension    Cervical spondylosis without myelopathy    Cervicalgia    Controlled type 2 diabetes mellitus without complication, without long-term current use of insulin ([place]) 10/01/2019   Diaphragmatic hernia without mention of obstruction or gangrene    Disorder of bone and cartilage, unspecified    Diverticulosis of colon (without mention of hemorrhage)    Encounter for long-term (current) use of other medications    GERD (gastroesophageal reflux disease)    History of hiatal hernia    Hyperlipidemia LDL goal < 100    Keratoconjunctivitis sicca, 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's    Other and unspecified hyperlipidemia    Pain in joint, site unspecified    Pre-diabetes    Routine general medical examination at a health care facility    Sjogren's syndrome ([place]) 10/27/2012   Tear film insufficiency, unspecified    Unspecified disorder of kidney and ureter    Unspecified glaucoma(365.9)     Past Surgical History:  Procedure Laterality Date   BELPHAROPTOSIS REPAIR     brow lift   bil   CATARACT  EXTRACTION W/ INTRAOCULAR LENS  IMPLANT, BILATERAL Bilateral 12/2014   Dr. Prudencio Burly   CHOLECYSTECTOMY  1993   Dr.Lindsey    DILATION AND CURETTAGE OF UTERUS     LUMBAR LAMINECTOMY/DECOMPRESSION MICRODISCECTOMY N/A 04/21/2017   Procedure: BILATERAL LUMBAR FOUR- LUMBAR FIVE AND LEFT LUMBAR FIVE- SACRAL ONE LAMINOTOMY, MEDIAL FACETECTOMY AND FORAMINOTOMIES;  Surgeon: Jovita Gamma, MD;  Location: [place] [facility];  Service: Neurosurgery;  Laterality: N/A;   NASAL SEPTUM SURGERY      There were no vitals filed for this visit.   Subjective Assessment - 02/17/21 1357     Subjective Started a new medication for overactive bladder this week and continues to see improvement with frequency of bladder voids and now able to go into community for errands without having to find a bathroom or rush home. Has noticed decrease in leakage overall as well and has not had anything since lasts visit, urgency also decreasing.    How long can you sit comfortably? no limits    How long can you stand comfortably? no limits    How long can you walk comfortably? no limits    Patient Stated Goals to have less leakage    Currently in Pain? No/denies  [place] PT Treatment/Exercise - 02/17/21 0001       Exercises   Exercises Lumbar;Knee/Hip      Lumbar Exercises: Stretches   Other Lumbar Stretch Exercise seated ball roll forward and bilaterally 10x10s; lateral trunk stretch on peanut ball x45s each    Other Lumbar Stretch Exercise trunk extension with red ball overhead lifts x10; standing modified mid trunk rotation 3x10s each      Lumbar Exercises: Standing   Other Standing Lumbar Exercises mini squats x5; mini lunges x8 each      Lumbar Exercises: Seated   Sit to Stand 15 reps    Other Seated Lumbar Exercises core contractions into standing foam roller in sitting 2x10x5s      Knee/Hip Exercises: Standing   Other Standing Knee Exercises standing 4 way hip 3# bil x10                        PT Short Term Goals - 02/17/21 1445       PT SHORT TERM GOAL #1   Title Pt to be I with HEP    Time 6    Period Weeks    Status Achieved    Target Date 01/14/21      PT SHORT TERM GOAL #2   Title pt to report no more than 3x urinary voids consistently at night for improved sleep at night    Baseline up to 5 times a night > (11/8) ~3-4 nightly    Time 6    Period Weeks    Status Partially Met    Target Date 01/14/21      PT SHORT TERM GOAL #3   Title pt to report urinary voids between 1-2 hours for decreased frequency to improve ability to complete community outings    Time 6    Period Weeks    Status Achieved    Target Date 01/14/21      PT SHORT TERM GOAL #4   Title pt to demonstrate at least 4/5 globally in bil hips for improved mobility in community and improved pelvic stability    Time 6    Period Weeks    Status Achieved    Target Date 01/14/21               PT Long Term Goals - 02/17/21 1446       PT LONG TERM GOAL #1   Title pt to be I with advanced HEP    Time 4    Period Months    Status Achieved      PT LONG TERM GOAL #2   Title pt to report no more than 1-2x urinary voids consistently at night for improved sleep at night    Baseline ~3-4    Time 4    Period Months    Status New      PT LONG TERM GOAL #3   Title pt to demonstrate at least 4+/5 globally in bil hips for improved mobility in community and improved pelvic stability    Baseline 4/5 bil hips(11/8)    Time 4    Period Months    Status Partially Met      PT LONG TERM GOAL #4   Title pt to report improved ability to correct and maintain proper coordination breathing pattern with core/pelvic floor contract/relax with activity at least 75% of the time    Time 4    Period Months    Status Achieved  PT LONG TERM GOAL #5   Title pt to demonstrate at least 3/5 pelvic floor strength for improved ability to prevent urinary leakage   pt deferred  all attempts at internall assessment due to personal reasons, therefore unable to met this goal   Time 4    Period Months    Status Not Met                   Plan - 02/17/21 1409     Clinical Impression Statement Pt presents to clinic reporting continued improvement with bladder urgency, frequency, and leakage overall and now able to go into community for errands and not feeling like she needed to rush home to use bathroom, over 2 hours now. Pt also hasn't had leakage with tasks and continues to attempt kegels at home as well and given handout for this as well. Pt session focused on hip and core strengthening and proper breathing techniques for all exercises to decrease strain on pelvic floor and decrease leakage. Pt tolerated well however was limited with Lt thoracic back pain intermittently and with rest this relieved. Pt requests to wait at least another week before scheduling another appointment to see if medication helps with her bladder frequency then plans to call for this update and schedule additional appointment(s). Pt would benefit from additional PT to address increasing time bewteen voids to up to 3-3.5 hours to improve ability to complete community activities more effectively for pt.    Personal Factors and Comorbidities Age;Time since onset of injury/illness/exacerbation    Examination-Activity Limitations Continence;Lift    Examination-Participation Restrictions Community Activity    Stability/Clinical Decision Making Stable/Uncomplicated    Rehab Potential Good    PT Frequency 1x / week    PT Duration Other (comment)    PT Treatment/Interventions ADLs/Self Care Home Management;Aquatic Therapy;Functional mobility training;Therapeutic activities;Therapeutic exercise;Neuromuscular re-education;Manual techniques;Patient/family education;Passive range of motion;Energy conservation;Taping    PT Next Visit Plan breathing mechanics with core and hip strengthening/stretching    PT  Home Exercise Plan [id number]    Consulted and Agree with Plan of Care Patient             Patient will benefit from skilled therapeutic intervention in order to improve the following deficits and impairments:  Increased fascial restricitons, Decreased coordination, Decreased endurance, Decreased mobility, Decreased strength, Postural dysfunction, Improper body mechanics, Impaired flexibility  Visit Diagnosis: Muscle weakness (generalized)  Lack of coordination     Problem List Patient Active Problem List   Diagnosis Date Noted   Controlled type 2 diabetes mellitus without complication, without long-term current use of insulin ([place]) 10/01/2019   Trochanteric bursitis 10/01/2019   Dizziness 10/01/2019   Palpitations 10/01/2019   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   Benign essential tremor 01/03/2017   Irritable bowel syndrome with constipation 08/09/2016   Neck pain, chronic 08/09/2016   Spinal stenosis in cervical region 08/09/2016   Generalized constriction of visual field 06/22/2016   Chronic pansinusitis 10/17/2015   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   Perennial allergic rhinitis 10/17/2015   Chronic pain syndrome 04/30/2013   Hyperglycemia 04/30/2013   Low back pain with right-sided sciatica 04/30/2013   Osteopenia, 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   Family history of colonic polyps 04/30/2013   Sjogren's syndrome ([place]) 10/27/2012   Keratoconjunctivitis sicca, 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's    Encounter for long-term (current) use of other medications    Disorder of bone and cartilage, unspecified    Allergic  rhinitis due to pollen    Cervical spondylosis without myelopathy    Unspecified glaucoma(365.9)    Benign essential hypertension    Mixed hyperlipidemia     Stacy Gardner, PT, DPT 11/08/222:48 PM   PHYSICAL THERAPY DISCHARGE SUMMARY  Visits from Start of Care: 8  Current functional level  related to goals / functional outcomes: Pt called after this/last appointment (02/17/21) to DC from PT reporting she no longer needed appointments.    Remaining deficits: Unable to formally reassess however pt reported no longer needing PT.    Education / Equipment: HEP   Patient agrees to discharge. Patient goals were partially met. Patient is being discharged due to being pleased with the current functional level. Thank you for the referral.    [facility] @ [place], [place], [address] Phone: [phone]   Fax:  [phone]  Name: Felicia Hall MRN: [id number] Date of Birth: 30-Aug-1945

## 2021-02-17 NOTE — Patient Instructions (Signed)
Pelvic Floor Strengthening:  Do 3x10 Kegels throughout the day  Do 1U84 Quick Flicks throughout the day (contract and relax quickly back to back) Do 3x10 contractions with a 5 second hold each time *You can put a hand on your abdomen and one on your bottom when doing these. There shouldn't be large movements here when trying to do Kegels. Kegels should be isolated to the pelvic floor *Do one round of 10 at morning, lunch, and evening.

## 2021-02-26 DIAGNOSIS — M9904 Segmental and somatic dysfunction of sacral region: Secondary | ICD-10-CM | POA: Diagnosis not present

## 2021-02-26 DIAGNOSIS — M9905 Segmental and somatic dysfunction of pelvic region: Secondary | ICD-10-CM | POA: Diagnosis not present

## 2021-02-26 DIAGNOSIS — M9903 Segmental and somatic dysfunction of lumbar region: Secondary | ICD-10-CM | POA: Diagnosis not present

## 2021-02-26 DIAGNOSIS — M5136 Other intervertebral disc degeneration, lumbar region: Secondary | ICD-10-CM | POA: Diagnosis not present

## 2021-03-02 DIAGNOSIS — D225 Melanocytic nevi of trunk: Secondary | ICD-10-CM | POA: Diagnosis not present

## 2021-03-02 DIAGNOSIS — D485 Neoplasm of uncertain behavior of skin: Secondary | ICD-10-CM | POA: Diagnosis not present

## 2021-03-02 DIAGNOSIS — X32XXXD Exposure to sunlight, subsequent encounter: Secondary | ICD-10-CM | POA: Diagnosis not present

## 2021-03-02 DIAGNOSIS — L570 Actinic keratosis: Secondary | ICD-10-CM | POA: Diagnosis not present

## 2021-03-02 DIAGNOSIS — L820 Inflamed seborrheic keratosis: Secondary | ICD-10-CM | POA: Diagnosis not present

## 2021-03-02 NOTE — Progress Notes (Signed)
[facility] Return Visit  SUBJECTIVE  History of Present Illness: Felicia Hall is a 75 y.o. female seen in follow-up for OAB. She has been attending pelvic floor PT. At last visit, Logan Bores was prescribed.   Frequency and leakage has improved. Has been doing pelvic physical therapy exercises. Can hold up to two hours. Still waking up at night (usually for other reasons that to void), but has noticed she has been sleeping longer.   Had been on the [place] about 3 weeks. Has noticed chronic runny nose and bowel frequency and feels it is from the [place]. She stopped it today to see if symptoms improved.   Past Medical History: Patient  has a past medical history of Acute upper respiratory infections of unspecified site, Allergic rhinitis due to pollen, Benign essential hypertension, Cervical spondylosis without myelopathy, Cervicalgia, Controlled type 2 diabetes mellitus without complication, without long-term current use of insulin ([place]) (10/01/2019), Diaphragmatic hernia without mention of obstruction or gangrene, Disorder of bone and cartilage, unspecified, Diverticulosis of colon (without mention of hemorrhage), Encounter for long-term (current) use of other medications, GERD (gastroesophageal reflux disease), History of hiatal hernia, Hyperlipidemia LDL goal < 100, Keratoconjunctivitis sicca, 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's, Other and unspecified hyperlipidemia, Pain in joint, site unspecified, Pre-diabetes, Routine general medical examination at a health care facility, Sjogren's syndrome ([place]) (10/27/2012), Tear film insufficiency, unspecified, Unspecified disorder of kidney and ureter, and Unspecified glaucoma(365.9).   Past Surgical History: She  has a past surgical history that includes Cholecystectomy (1993); Cataract extraction w/ intraocular lens  implant, bilateral (Bilateral, 12/2014); Nasal septum surgery; Blepharoptosis repair; Dilation and curettage of uterus; and Lumbar  laminectomy/decompression microdiscectomy (N/A, 04/21/2017).   Medications: She has a current medication list which includes the following prescription(s): aspirin ec, brinzolamide, calcium citrate-vitamin d, cholecalciferol, chromium picolinate, covid-19 mrna bivalent vaccine (pfizer), pfizer-biont covid-19 vac-tris, ezetimibe, gemtesa, losartan, pantoprazole, and rosuvastatin.   Allergies: Patient is allergic to statins, allopurinol, and darvocet [propoxyphene n-acetaminophen].   Social History: Patient  reports that she has never smoked. She has never used smokeless tobacco. She reports current alcohol use. She reports that she does not use drugs.      OBJECTIVE     Physical Exam: Vitals:   03/04/21 0952  BP: (!) 160/83  Pulse: 88  Weight: 163 lb (73.9 kg)   Gen: No apparent distress, A&O x 3.  Detailed Urogynecologic Evaluation:  Deferred.    ASSESSMENT AND PLAN    Ms. Heslin is a 75 y.o. with:  1. Overactive bladder    - She will stop Gemtesa for a few days to see what her symptoms are like. She feels she has had good improvement overall and is not sure if its from the PT. If symptoms return, she will restart the [facility].  - return 2 months for follow up  Jaquita Folds, MD  Time spent: I spent 20 minutes dedicated to the care of this patient on the date of this encounter to include pre-visit review of records, face-to-face time with the patient and post visit documentation.

## 2021-03-04 ENCOUNTER — Ambulatory Visit (INDEPENDENT_AMBULATORY_CARE_PROVIDER_SITE_OTHER): Payer: Medicare Other | Admitting: Obstetrics and Gynecology

## 2021-03-04 ENCOUNTER — Other Ambulatory Visit: Payer: Self-pay

## 2021-03-04 ENCOUNTER — Encounter: Payer: Self-pay | Admitting: Obstetrics and Gynecology

## 2021-03-04 VITALS — BP 160/83 | HR 88 | Wt 163.0 lb

## 2021-03-04 DIAGNOSIS — N3281 Overactive bladder: Secondary | ICD-10-CM | POA: Diagnosis not present

## 2021-03-10 DIAGNOSIS — M9904 Segmental and somatic dysfunction of sacral region: Secondary | ICD-10-CM | POA: Diagnosis not present

## 2021-03-10 DIAGNOSIS — M9905 Segmental and somatic dysfunction of pelvic region: Secondary | ICD-10-CM | POA: Diagnosis not present

## 2021-03-10 DIAGNOSIS — M5136 Other intervertebral disc degeneration, lumbar region: Secondary | ICD-10-CM | POA: Diagnosis not present

## 2021-03-10 DIAGNOSIS — M9903 Segmental and somatic dysfunction of lumbar region: Secondary | ICD-10-CM | POA: Diagnosis not present

## 2021-03-19 DIAGNOSIS — M9904 Segmental and somatic dysfunction of sacral region: Secondary | ICD-10-CM | POA: Diagnosis not present

## 2021-03-19 DIAGNOSIS — M9903 Segmental and somatic dysfunction of lumbar region: Secondary | ICD-10-CM | POA: Diagnosis not present

## 2021-03-19 DIAGNOSIS — M5136 Other intervertebral disc degeneration, lumbar region: Secondary | ICD-10-CM | POA: Diagnosis not present

## 2021-03-19 DIAGNOSIS — M9905 Segmental and somatic dysfunction of pelvic region: Secondary | ICD-10-CM | POA: Diagnosis not present

## 2021-03-31 DIAGNOSIS — M9903 Segmental and somatic dysfunction of lumbar region: Secondary | ICD-10-CM | POA: Diagnosis not present

## 2021-03-31 DIAGNOSIS — M5136 Other intervertebral disc degeneration, lumbar region: Secondary | ICD-10-CM | POA: Diagnosis not present

## 2021-03-31 DIAGNOSIS — M9904 Segmental and somatic dysfunction of sacral region: Secondary | ICD-10-CM | POA: Diagnosis not present

## 2021-03-31 DIAGNOSIS — M9905 Segmental and somatic dysfunction of pelvic region: Secondary | ICD-10-CM | POA: Diagnosis not present

## 2021-04-01 DIAGNOSIS — D485 Neoplasm of uncertain behavior of skin: Secondary | ICD-10-CM | POA: Diagnosis not present

## 2021-04-01 DIAGNOSIS — L988 Other specified disorders of the skin and subcutaneous tissue: Secondary | ICD-10-CM | POA: Diagnosis not present

## 2021-04-09 DIAGNOSIS — M9904 Segmental and somatic dysfunction of sacral region: Secondary | ICD-10-CM | POA: Diagnosis not present

## 2021-04-09 DIAGNOSIS — M5136 Other intervertebral disc degeneration, lumbar region: Secondary | ICD-10-CM | POA: Diagnosis not present

## 2021-04-09 DIAGNOSIS — M9903 Segmental and somatic dysfunction of lumbar region: Secondary | ICD-10-CM | POA: Diagnosis not present

## 2021-04-09 DIAGNOSIS — M9905 Segmental and somatic dysfunction of pelvic region: Secondary | ICD-10-CM | POA: Diagnosis not present

## 2021-04-13 NOTE — Progress Notes (Signed)
Cardiology Office Note:    Date:  04/15/2021  ID:  Felicia Hall, DOB September 29, 1945, MRN [id number]  PCP:  Gayland Curry, DO  Cardiologist:  Donato Heinz, MD  Electrophysiologist:  None   Referring MD: Gayland Curry, DO   Chief Complaint  Patient presents with   Follow-up    4 months.   Headache    History of Present Illness:    Felicia Hall is a 76 y.o. female with a hx of T2DM, GERD, hyperlipidemia, hypertension who presents for follow-up.  She was referred by Dr. Mariea Clonts for evaluation of palpitations on 10/24/2019.   She reports that palpitations started in May 2021.  She was watching TV and had an episode where she felt her heart was racing.  Lasted for few minutes and resolved.  She denies any lightheadedness or syncope during this episode.  Then earlier in July 2021 had an episode where she felt like her heart was fluttering, lasted few seconds and resolved.  She reports that she exercises by riding stationary bicycle every day for 30 minutes.  She denies any chest pain or dyspnea.  She denies any lower extremity edema.  No smoking history.  Rare alcohol use.  She drinks one latte every morning, no other caffeine throughout the day.  Brother had CABG in 49s.  She has had issues with myalgias on statins in the past.    Zio patch x14 days on 11/21/2019 showed no significant arrhythmias.  Calcium score on 8/2/221 was 463 (86 percentile).  Echo 02/04/2020 showed LVEF 70 to 75%, normal RV function, grade 1 diastolic dysfunction, mild MR, recorded as severe pulmonary artery systolic pressure elevation but pulmonary pressures appear normal.   Lexiscan Myoview on 02/04/2020 showed normal perfusion, EF 76%.  Since last clinic visit, she reports that she is doing well.  Denies any chest pain, dyspnea, lower extremity edema, or palpitations.  She reports she had an episode of syncope on March 21, 2021.  Reports [place] the airport and started to feel lightheaded.   Reports she had not been drinking much that day.  States that she briefly passed out and fell to the ground.  Reports she was evaluated by EMS and was told everything looks okay.  Denies any lightheadedness since that time.  No palpitations.  Does stationary bike for 30 minutes/day, denies any exertional symptoms.  Has not been checking BP at home.     Past Medical History:  Diagnosis Date   Acute upper respiratory infections of unspecified site    Allergic rhinitis due to pollen    Benign essential hypertension    Cervical spondylosis without myelopathy    Cervicalgia    Controlled type 2 diabetes mellitus without complication, without long-term current use of insulin ([place]) 10/01/2019   Diaphragmatic hernia without mention of obstruction or gangrene    Disorder of bone and cartilage, unspecified    Diverticulosis of colon (without mention of hemorrhage)    Encounter for long-term (current) use of other medications    GERD (gastroesophageal reflux disease)    History of hiatal hernia    Hyperlipidemia LDL goal < 100    Keratoconjunctivitis sicca, 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's    Other and unspecified hyperlipidemia    Pain in joint, site unspecified    Pre-diabetes    Routine general medical examination at a health care facility    Sjogren's syndrome ([place]) 10/27/2012   Tear film insufficiency, unspecified  Unspecified disorder of kidney and ureter    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 Prudencio Burly   CHOLECYSTECTOMY  1993   Dr.Lindsey    DILATION AND CURETTAGE OF UTERUS     LUMBAR LAMINECTOMY/DECOMPRESSION MICRODISCECTOMY N/A 04/21/2017   Procedure: BILATERAL LUMBAR FOUR- LUMBAR FIVE AND LEFT LUMBAR FIVE- SACRAL ONE LAMINOTOMY, MEDIAL FACETECTOMY AND FORAMINOTOMIES;  Surgeon: Jovita Gamma, MD;  Location: [place];   Service: Neurosurgery;  Laterality: N/A;   NASAL SEPTUM SURGERY      Current Medications: Current Meds  Medication Sig   aspirin EC 81 MG tablet Take 1 tablet (81 mg total) by mouth daily. Swallow whole.   brinzolamide (AZOPT) 1 % ophthalmic suspension Place 1 drop into both eyes 2 (two) times daily.   calcium citrate-vitamin D (CITRACAL+D) 315-200 MG-UNIT per tablet Take 1 tablet by mouth 4 (four) times daily.   cholecalciferol (VITAMIN D) 1000 UNITS tablet Take 2,000 Units by mouth daily.    Chromium Picolinate 800 MCG TABS Take by mouth daily after breakfast.   COVID-19 mRNA bivalent vaccine, Pfizer, injection Inject into the muscle.   COVID-19 mRNA Vac-TriS, Pfizer, (PFIZER-BIONT COVID-19 VAC-TRIS) SUSP injection Inject into the muscle.   GEMTESA 75 MG TABS TAKE 1 TABLET BY MOUTH EVERY DAY   losartan (COZAAR) 50 MG tablet Take one tablet by mouth once daily for blood pressure   pantoprazole (PROTONIX) 40 MG tablet Take 1 tablet (40 mg total) by mouth daily before breakfast.   rosuvastatin (CRESTOR) 10 MG tablet Take 1 tablet (10 mg total) by mouth daily.     Allergies:   Statins, Allopurinol, and Darvocet [propoxyphene n-acetaminophen]   Social History   Socioeconomic History   Marital status: Single    Spouse name: Not on file   Number of children: Not on file   Years of education: Not on file   Highest education level: Not on file  Occupational History   Occupation: Herbalist  Tobacco Use   Smoking status: Never   Smokeless tobacco: Never  Vaping Use   Vaping Use: Never used  Substance and Sexual Activity   Alcohol use: Yes    Alcohol/week: 0.0 standard drinks    Comment: once a year   Drug use: No   Sexual activity: Not on file  Other Topics Concern   Not on file  Social History Narrative   Single   No children   Never smoked   Alcohol none   Exercise -stretches, hand weights    Social Determinants of Health   Financial Resource Strain: Not on file   Food Insecurity: Not on file  Transportation Needs: Not on file  Physical Activity: Not on file  Stress: Not on file  Social Connections: Not on file     Family History: The patient's family history includes Breast cancer in her mother; Heart attack in her mother; Heart disease in her brother; Lupus in her cousin; Pancreatitis in her father.  ROS:   Please see the history of present illness.     All other systems reviewed and are negative.  EKGs/Labs/Other Studies Reviewed:    The following studies were reviewed today:   EKG:  EKG is not ordered today.  The ekg ordered at prior clinic visit demonstrates sinus rhythm, first-degree AV block, no ST changes  Recent Labs: 06/09/2020: BUN 13; Creat 0.72; Potassium 4.2; Sodium 136  Recent Lipid Panel    Component Value Date/Time   CHOL 115 12/18/2020 0813   TRIG 99 12/18/2020 0813   HDL 45 12/18/2020 0813   CHOLHDL 2.6 12/18/2020 0813   CHOLHDL 2.9 06/09/2020 0828   VLDL 36 (H) 08/06/2016 0825   LDLCALC 51 12/18/2020 0813   LDLCALC 69 06/09/2020 0828    Physical Exam:    VS:  BP 140/80 (BP Location: Left Arm, Patient Position: Sitting, Cuff Size: Normal)    Pulse 76    Ht 5\' 5"  (1.651 m)    Wt 161 lb (73 kg)    BMI 26.79 kg/m     Wt Readings from Last 3 Encounters:  04/15/21 161 lb (73 kg)  03/04/21 163 lb (73.9 kg)  12/12/20 163 lb 6.4 oz (74.1 kg)     GEN: Well nourished, well developed in no acute distress HEENT: Normal NECK: No JVD; No carotid bruits LYMPHATICS: No lymphadenopathy CARDIAC: RRR, no murmurs, rubs, gallops RESPIRATORY:  Clear to auscultation without rales, wheezing or rhonchi  ABDOMEN: Soft, non-tender, non-distended MUSCULOSKELETAL:  No edema; No deformity  SKIN: Warm and dry NEUROLOGIC:  Alert and oriented x 3 PSYCHIATRIC:  Normal affect   ASSESSMENT:    1. Syncope and collapse   2. Coronary artery disease involving native coronary artery of native heart, unspecified whether angina present    3. Palpitations   4. Essential hypertension   5. Hyperlipidemia, unspecified hyperlipidemia type     PLAN:    Syncope: Unclear cause.  She reports she thinks she was dehydrated and.  Will check echocardiogram to rule out structural heart disease.  Zio patch x2 weeks to evaluate for arrhythmia  CAD: Calcium score on 8/2/221 was 463 (86 percentile).  She reported atypical lower chest/epigastric pain.  Echo 02/04/2020 showed LVEF 70 to 75%, normal RV function, grade 1 diastolic dysfunction, mild MR, recorded as severe pulmonary artery systolic pressure elevation but pulmonary pressures appear normal.  Lexiscan Myoview on 02/04/2020 showed normal perfusion, EF 76%. -Continue aspirin 81 mg daily -LDL 76 on 01/29/2020, rosuvastatin increased to 20 mg daily but developed myalgias and was reduced to 10 mg daily.  Zetia 10 mg daily added.  LDL 51 on 12/18/2020  Palpitations: Zio patch x14 days on 11/21/2019 showed no significant arrhythmias.  Hyperlipidemia: LDL 76 on 01/29/2020, rosuvastatin increased to 20 mg daily but developed myalgias and was reduced to 10 mg daily.  Zetia 10 mg daily added.  LDL 51 on 12/18/2020  Hypertension: On losartan 50 mg daily.  Mildly elevated in clinic today, asked to check BP twice daily for next week and call with results  T2DM: A1c 6.5 on 09/27/2019  RTC in 6 months   Medication Adjustments/Labs and Tests Ordered: Current medicines are reviewed at length with the patient today.  Concerns regarding medicines are outlined above.  Orders Placed This Encounter  Procedures   LONG TERM MONITOR (3-14 DAYS)   ECHOCARDIOGRAM COMPLETE   No orders of the defined types were placed in this encounter.   Patient Instructions  Medication Instructions:  The current medical regimen is effective;  continue present plan and medications.  *If you need a refill on your cardiac medications before your next appointment, please call your  pharmacy*   Testing/Procedures: Echocardiogram - Your physician has requested that you have an echocardiogram. Echocardiography is a painless test that uses sound waves to create images of your heart. It provides your  doctor with information about the size and shape of your heart and how well your hearts chambers and valves are working. This procedure takes approximately one hour. There are no restrictions for this procedure. This will be performed at either our [facility] location - [address] [place] location [facility] 2nd floor.  ZIO XT- Long Term Monitor Instructions  Your physician has requested you wear a ZIO patch monitor for 14 days.  This is a single patch monitor. [employer] supplies one patch monitor per enrollment. Additional stickers are not available. Please do not apply patch if you will be having a Nuclear Stress Test,  Echocardiogram, Cardiac CT, MRI, or Chest Xray during the period you would be wearing the  monitor. The patch cannot be worn during these tests. You cannot remove and re-apply the  ZIO XT patch monitor.  Your ZIO patch monitor will be mailed 3 day USPS to your address on file. It may take 3-5 days  to receive your monitor after you have been enrolled.  Once you have received your monitor, please review the enclosed instructions. Your monitor  has already been registered assigning a specific monitor serial # to you.  Billing and Patient Assistance Program Information  We have supplied [employer] with any of your insurance information on file for billing purposes. [employer] offers a sliding scale Patient Assistance Program for patients that do not have  insurance, or whose insurance does not completely cover the cost of the ZIO monitor.  You must apply for the Patient Assistance Program to qualify for this discounted rate.  To apply, please call [employer] at [phone], select option 4, select option 2, ask to apply for  Patient  Assistance Program. Theodore Demark will ask your household income, and how many people  are in your household. They will quote your out-of-pocket cost based on that information.  [employer] will also be able to set up a 62-month, interest-free payment plan if needed.  Applying the monitor   Shave hair from upper left chest.  Hold abrader disc by orange tab. Rub abrader in 40 strokes over the upper left chest as  indicated in your monitor instructions.  Clean area with 4 enclosed alcohol pads. Let dry.  Apply patch as indicated in monitor instructions. Patch will be placed under collarbone on left  side of chest with arrow pointing upward.  Rub patch adhesive wings for 2 minutes. Remove white label marked "1". Remove the white  label marked "2". Rub patch adhesive wings for 2 additional minutes.  While looking in a mirror, press and release button in center of patch. A small green light will  flash 3-4 times. This will be your only indicator that the monitor has been turned on.  Do not shower for the first 24 hours. You may shower after the first 24 hours.  Press the button if you feel a symptom. You will hear a small click. Record Date, Time and  Symptom in the Patient Logbook.  When you are ready to remove the patch, follow instructions on the last 2 pages of Patient  Logbook. Stick patch monitor onto the last page of Patient Logbook.  Place Patient Logbook in the blue and white box. Use locking tab on box and tape box closed  securely. The blue and white box has prepaid postage on it. Please place it in the mailbox as  soon as possible. Your physician should have your test results approximately 7 days after the  monitor has been mailed back to [place].  Call [place] at [phone] if you have questions regarding  your ZIO XT patch monitor. Call them immediately if you see an orange light blinking on your  monitor.  If your monitor falls off in less than 4 days,  contact our Monitor department at [phone].  If your monitor becomes loose or falls off after 4 days call [employer] at [phone] for  suggestions on securing your monitor    Follow-Up: At [facility], you and your health needs are our priority.  As part of our continuing mission to provide you with exceptional heart care, we have created designated Provider Care Teams.  These Care Teams include your primary Cardiologist (physician) and Advanced Practice Providers (APPs -  Physician Assistants and Nurse Practitioners) who all work together to provide you with the care you need, when you need it.  We recommend signing up for the patient portal called "MyChart".  Sign up information is provided on this After Visit Summary.  MyChart is used to connect with patients for Virtual Visits (Telemedicine).  Patients are able to view lab/test results, encounter notes, upcoming appointments, etc.  Non-urgent messages can be sent to your provider as well.   To learn more about what you can do with MyChart, go to [email or web address].    Your next appointment:   6 month(s)  The format for your next appointment:   In Person  Provider:   Donato Heinz, MD      Please take your blood pressure twice daily for the next week- please call us with the readings.     Signed, Donato Heinz, MD  04/15/2021 12:08 PM    [place]

## 2021-04-15 ENCOUNTER — Other Ambulatory Visit: Payer: Self-pay

## 2021-04-15 ENCOUNTER — Ambulatory Visit (INDEPENDENT_AMBULATORY_CARE_PROVIDER_SITE_OTHER): Payer: Medicare Other

## 2021-04-15 ENCOUNTER — Encounter: Payer: Self-pay | Admitting: Cardiology

## 2021-04-15 ENCOUNTER — Ambulatory Visit: Payer: Medicare Other | Admitting: Cardiology

## 2021-04-15 VITALS — BP 140/80 | HR 76 | Ht 65.0 in | Wt 161.0 lb

## 2021-04-15 DIAGNOSIS — R55 Syncope and collapse: Secondary | ICD-10-CM | POA: Diagnosis not present

## 2021-04-15 DIAGNOSIS — R002 Palpitations: Secondary | ICD-10-CM | POA: Diagnosis not present

## 2021-04-15 DIAGNOSIS — I10 Essential (primary) hypertension: Secondary | ICD-10-CM

## 2021-04-15 DIAGNOSIS — E785 Hyperlipidemia, unspecified: Secondary | ICD-10-CM | POA: Diagnosis not present

## 2021-04-15 DIAGNOSIS — I2510 Atherosclerotic heart disease of native coronary artery without angina pectoris: Secondary | ICD-10-CM

## 2021-04-15 MED ORDER — ROSUVASTATIN CALCIUM 10 MG PO TABS: 10.0000 mg | ORAL_TABLET | Freq: Every day | ORAL | 3 refills | Status: DC

## 2021-04-15 NOTE — Addendum Note (Signed)
Addended by: Caprice Beaver T on: 04/15/2021 02:10 PM   Modules accepted: Orders

## 2021-04-15 NOTE — Patient Instructions (Addendum)
Medication Instructions:  The current medical regimen is effective;  continue present plan and medications.  *If you need a refill on your cardiac medications before your next appointment, please call your pharmacy*   Testing/Procedures: Echocardiogram - Your physician has requested that you have an echocardiogram. Echocardiography is a painless test that uses sound waves to create images of your heart. It provides your doctor with information about the size and shape of your heart and how well your hearts chambers and valves are working. This procedure takes approximately one hour. There are no restrictions for this procedure. This will be performed at either our [facility] location - [address], [place] location [facility] 2nd floor.  ZIO XT- Long Term Monitor Instructions  Your physician has requested you wear a ZIO patch monitor for 14 days.  This is a single patch monitor. [employer] supplies one patch monitor per enrollment. Additional stickers are not available. Please do not apply patch if you will be having a Nuclear Stress Test,  Echocardiogram, Cardiac CT, MRI, or Chest Xray during the period you would be wearing the  monitor. The patch cannot be worn during these tests. You cannot remove and re-apply the  ZIO XT patch monitor.  Your ZIO patch monitor will be mailed 3 day USPS to your address on file. It may take 3-5 days  to receive your monitor after you have been enrolled.  Once you have received your monitor, please review the enclosed instructions. Your monitor  has already been registered assigning a specific monitor serial # to you.  Billing and Patient Assistance Program Information  We have supplied [employer] with any of your insurance information on file for billing purposes. [employer] offers a sliding scale Patient Assistance Program for patients that do not have  insurance, or whose insurance does not completely cover the cost of the  ZIO monitor.  You must apply for the Patient Assistance Program to qualify for this discounted rate.  To apply, please call [employer] at [phone], select option 4, select option 2, ask to apply for  Patient Assistance Program. Theodore Demark will ask your household income, and how many people  are in your household. They will quote your out-of-pocket cost based on that information.  [employer] will also be able to set up a 80-month, interest-free payment plan if needed.  Applying the monitor   Shave hair from upper left chest.  Hold abrader disc by orange tab. Rub abrader in 40 strokes over the upper left chest as  indicated in your monitor instructions.  Clean area with 4 enclosed alcohol pads. Let dry.  Apply patch as indicated in monitor instructions. Patch will be placed under collarbone on left  side of chest with arrow pointing upward.  Rub patch adhesive wings for 2 minutes. Remove white label marked "1". Remove the white  label marked "2". Rub patch adhesive wings for 2 additional minutes.  While looking in a mirror, press and release button in center of patch. A small green light will  flash 3-4 times. This will be your only indicator that the monitor has been turned on.  Do not shower for the first 24 hours. You may shower after the first 24 hours.  Press the button if you feel a symptom. You will hear a small click. Record Date, Time and  Symptom in the Patient Logbook.  When you are ready to remove the patch, follow instructions on the last 2 pages of Patient  Logbook. Stick patch monitor onto the last page of Patient Logbook.  Place Patient Logbook in the blue and white box. Use locking tab on box and tape box closed  securely. The blue and white box has prepaid postage on it. Please place it in the mailbox as  soon as possible. Your physician should have your test results approximately 7 days after the  monitor has been mailed back to [facility].  Call [place] at [phone] if you have questions regarding  your ZIO XT patch monitor. Call them immediately if you see an orange light blinking on your  monitor.  If your monitor falls off in less than 4 days, contact our Monitor department at [phone].  If your monitor becomes loose or falls off after 4 days call [employer] at [phone] for  suggestions on securing your monitor    Follow-Up: At [facility], you and your health needs are our priority.  As part of our continuing mission to provide you with exceptional heart care, we have created designated Provider Care Teams.  These Care Teams include your primary Cardiologist (physician) and Advanced Practice Providers (APPs -  Physician Assistants and Nurse Practitioners) who all work together to provide you with the care you need, when you need it.  We recommend signing up for the patient portal called "MyChart".  Sign up information is provided on this After Visit Summary.  MyChart is used to connect with patients for Virtual Visits (Telemedicine).  Patients are able to view lab/test results, encounter notes, upcoming appointments, etc.  Non-urgent messages can be sent to your provider as well.   To learn more about what you can do with MyChart, go to [email or web address].    Your next appointment:   6 month(s)  The format for your next appointment:   In Person  Provider:   Donato Heinz, MD      Please take your blood pressure twice daily for the next week- please call us with the readings.

## 2021-04-15 NOTE — Progress Notes (Unsigned)
Enrolled patient for a 14 day Zio XT  monitor to be mailed to patients home  °

## 2021-04-17 ENCOUNTER — Telehealth: Payer: Self-pay | Admitting: Cardiology

## 2021-04-17 NOTE — Telephone Encounter (Signed)
° ° °*  STAT* If patient is at the pharmacy, call can be transferred to refill team.   1. Which medications need to be refilled? (please list name of each medication and dose if known) rosuvastatin (CRESTOR) 10 MG tablet  2. Which pharmacy/location (including street and city if local pharmacy) is medication to be sent to? [id number] - [place], [place]  3. Do they need a 30 day or 90 day supply? 90 days   Pt said her pharmacy told her no prescription received yet, she requested to resend prescription

## 2021-04-20 ENCOUNTER — Encounter: Payer: Self-pay | Admitting: Cardiology

## 2021-04-20 MED ORDER — ROSUVASTATIN CALCIUM 10 MG PO TABS: 10.0000 mg | ORAL_TABLET | Freq: Every day | ORAL | 3 refills | Status: DC

## 2021-04-21 DIAGNOSIS — M9904 Segmental and somatic dysfunction of sacral region: Secondary | ICD-10-CM | POA: Diagnosis not present

## 2021-04-21 DIAGNOSIS — M9902 Segmental and somatic dysfunction of thoracic region: Secondary | ICD-10-CM | POA: Diagnosis not present

## 2021-04-21 DIAGNOSIS — M5134 Other intervertebral disc degeneration, thoracic region: Secondary | ICD-10-CM | POA: Diagnosis not present

## 2021-04-21 DIAGNOSIS — M5136 Other intervertebral disc degeneration, lumbar region: Secondary | ICD-10-CM | POA: Diagnosis not present

## 2021-04-23 ENCOUNTER — Telehealth: Payer: Self-pay | Admitting: Cardiology

## 2021-04-23 NOTE — Telephone Encounter (Signed)
Pt calling in to provide bp readings:  01/05:  123/81 am             103/63 pm  01/06:  111/77 am             112/73 pm  01/07:  116/75 am             111/73 pm  01/08:  112/77 am             109/62 pm  01/09:  117/75 am             109/78 pm  01/10:  118/73 am             110/77 pm  01/11:  119/75 am             111/79 pm

## 2021-04-23 NOTE — Telephone Encounter (Signed)
Spoke with patient regarding the BP readings she sent in. She denies having any symptoms. Pt calling in to provide bp readings:   01/05:  123/81 am             103/63 pm   01/06:  111/77 am             112/73 pm   01/07:  116/75 am             111/73 pm   01/08:  112/77 am             109/62 pm   01/09:  117/75 am             109/78 pm   01/10:  118/73 am             110/77 pm   01/11:  119/75 am             111/79 pm

## 2021-04-24 NOTE — Telephone Encounter (Signed)
Returned call to patient, advised patient of Dr. Newman Nickels recommendations. Patient verbalized understanding.   Advised patient to call back to office with any issues, questions, or concerns. Patient verbalized understanding.

## 2021-04-24 NOTE — Telephone Encounter (Signed)
BP looks good, no changes recommended 

## 2021-04-30 ENCOUNTER — Ambulatory Visit (HOSPITAL_COMMUNITY): Payer: Medicare Other | Attending: Cardiology

## 2021-04-30 ENCOUNTER — Other Ambulatory Visit: Payer: Self-pay

## 2021-04-30 DIAGNOSIS — M9902 Segmental and somatic dysfunction of thoracic region: Secondary | ICD-10-CM | POA: Diagnosis not present

## 2021-04-30 DIAGNOSIS — M5136 Other intervertebral disc degeneration, lumbar region: Secondary | ICD-10-CM | POA: Diagnosis not present

## 2021-04-30 DIAGNOSIS — M9904 Segmental and somatic dysfunction of sacral region: Secondary | ICD-10-CM | POA: Diagnosis not present

## 2021-04-30 DIAGNOSIS — M5134 Other intervertebral disc degeneration, thoracic region: Secondary | ICD-10-CM | POA: Diagnosis not present

## 2021-04-30 DIAGNOSIS — R55 Syncope and collapse: Secondary | ICD-10-CM | POA: Insufficient documentation

## 2021-04-30 LAB — ECHOCARDIOGRAM COMPLETE
Area-P 1/2: 3.21 cm2
S' Lateral: 2.2 cm

## 2021-05-01 DIAGNOSIS — R55 Syncope and collapse: Secondary | ICD-10-CM

## 2021-05-06 ENCOUNTER — Ambulatory Visit: Payer: Medicare Other | Admitting: Obstetrics and Gynecology

## 2021-05-12 DIAGNOSIS — M5136 Other intervertebral disc degeneration, lumbar region: Secondary | ICD-10-CM | POA: Diagnosis not present

## 2021-05-12 DIAGNOSIS — M9902 Segmental and somatic dysfunction of thoracic region: Secondary | ICD-10-CM | POA: Diagnosis not present

## 2021-05-12 DIAGNOSIS — M5134 Other intervertebral disc degeneration, thoracic region: Secondary | ICD-10-CM | POA: Diagnosis not present

## 2021-05-12 DIAGNOSIS — M9904 Segmental and somatic dysfunction of sacral region: Secondary | ICD-10-CM | POA: Diagnosis not present

## 2021-05-21 ENCOUNTER — Encounter: Payer: Self-pay | Admitting: Orthopedic Surgery

## 2021-05-21 ENCOUNTER — Ambulatory Visit (INDEPENDENT_AMBULATORY_CARE_PROVIDER_SITE_OTHER): Payer: Medicare Other | Admitting: Orthopedic Surgery

## 2021-05-21 ENCOUNTER — Other Ambulatory Visit: Payer: Self-pay

## 2021-05-21 VITALS — BP 124/78 | HR 87 | Temp 96.9°F | Ht 66.0 in | Wt 163.0 lb

## 2021-05-21 DIAGNOSIS — I10 Essential (primary) hypertension: Secondary | ICD-10-CM

## 2021-05-21 DIAGNOSIS — R5383 Other fatigue: Secondary | ICD-10-CM | POA: Diagnosis not present

## 2021-05-21 DIAGNOSIS — R55 Syncope and collapse: Secondary | ICD-10-CM

## 2021-05-21 DIAGNOSIS — I2510 Atherosclerotic heart disease of native coronary artery without angina pectoris: Secondary | ICD-10-CM | POA: Diagnosis not present

## 2021-05-21 DIAGNOSIS — M9902 Segmental and somatic dysfunction of thoracic region: Secondary | ICD-10-CM | POA: Diagnosis not present

## 2021-05-21 DIAGNOSIS — M5136 Other intervertebral disc degeneration, lumbar region: Secondary | ICD-10-CM | POA: Diagnosis not present

## 2021-05-21 DIAGNOSIS — H409 Unspecified glaucoma: Secondary | ICD-10-CM | POA: Diagnosis not present

## 2021-05-21 DIAGNOSIS — M7061 Trochanteric bursitis, right hip: Secondary | ICD-10-CM

## 2021-05-21 DIAGNOSIS — E782 Mixed hyperlipidemia: Secondary | ICD-10-CM | POA: Diagnosis not present

## 2021-05-21 DIAGNOSIS — M3501 Sicca syndrome with keratoconjunctivitis: Secondary | ICD-10-CM

## 2021-05-21 DIAGNOSIS — Z9109 Other allergy status, other than to drugs and biological substances: Secondary | ICD-10-CM

## 2021-05-21 DIAGNOSIS — K219 Gastro-esophageal reflux disease without esophagitis: Secondary | ICD-10-CM | POA: Diagnosis not present

## 2021-05-21 DIAGNOSIS — M5134 Other intervertebral disc degeneration, thoracic region: Secondary | ICD-10-CM | POA: Diagnosis not present

## 2021-05-21 DIAGNOSIS — M159 Polyosteoarthritis, unspecified: Secondary | ICD-10-CM | POA: Diagnosis not present

## 2021-05-21 DIAGNOSIS — M9904 Segmental and somatic dysfunction of sacral region: Secondary | ICD-10-CM | POA: Diagnosis not present

## 2021-05-21 DIAGNOSIS — E119 Type 2 diabetes mellitus without complications: Secondary | ICD-10-CM

## 2021-05-21 DIAGNOSIS — M7062 Trochanteric bursitis, left hip: Secondary | ICD-10-CM

## 2021-05-21 NOTE — Progress Notes (Signed)
Careteam: Patient Care Team: Gayland Curry, DO as PCP - General (Geriatric Medicine) Donato Heinz, MD as PCP - Cardiology (Cardiology) Katy Apo, MD as Consulting Physician (Ophthalmology)  Seen by: Windell Moulding, AGNP-C  PLACE OF SERVICE:  [place] Directive information Does Patient Have a Medical Advance Directive?: Yes, Type of Advance Directive: [place], Does patient want to make changes to medical advance directive?: No - Patient declined  Allergies  Allergen Reactions   Statins Other (See Comments)    Muscle problems   Allopurinol Rash   Darvocet [Propoxyphene N-Acetaminophen] Rash    Chief Complaint  Patient presents with   Establish Care    New patient to reestablish care.      HPI: Patient is a 76 y.o. female seen today to establish at [facility].   Previous provider was Dr.Tiffany Reed. Originally from [place]. Retired, Statistician. Single. No children. Brother emergency contact. Loves to read books and do puzzles.   HTN- diagnosed in 50's, followed by Dr. Gardiner Rhyme, takes blood pressures at home,averages SBP 110-120's, on losartan, follows low sodium diet  HLD- diagnosed in 50's, on Crestor, follows low fat diet, remains on Zetia and Crestor  OA- reports stiffness in elbows, wrists and hands, pain improves during the day, does not take medication at this time  Bursitis- in both hips, followed by Dr. Ninfa Linden, done PT in past, she continues to do exercises daily, pain has increased in past few weeks, tried voltaren gel for pain without success  Syncope/fall- she fell at the airport last month, reports feeling lightheaded, believes she was dehydrated, she saw Dr. Gardiner Rhyme, Zio patch done- waiting for results, Echo 01/29 LVEF 68%.   Glaucoma- followed by Dr. Prudencio Burly, involves both eyes, remains on Azopt drops, reports some floaters  GERD- does well with Protonix, does not know food  triggers  Sjogrens syndrome- diagnosed in 50's, not followed by specalist, dryness to eyes/mouth/throat/skin, uses biotene swishes for mouth, artificial tears for dry eyes, tried Restasis without success  Allergies/sinusitis- all her life, past nasal surgery, takes chlorpheniramine  Osteopenia- DEXA 2019, remains on calcium and vitamin D  Fatigue- reports feeling more tired, napping more  Past surgeries:  Gallbladder- 1992  Cataract - 2016  Decompression lower back - 2019- Dr. Sherwood Gambler  No recent hospitalizations or injuries.   Prior procedures:  Colonoscopy 2008. Agreed to do Cologuard in 2025 per Dr. Mariea Clonts note  Bone density- 2019  PAP - 2020  Mammogram- 2021  Vaccinations up to date.   Family history:  Father- deceased age 31- pancreatitis  Mother- deceased age 59- heart failure/ Hx: breast cancer, osteoporosis  Brother- alive age 36/ Hx: bypass surgery   Smoking: never smoked, exposure to second hand smoke as a child- father smoked Alcohol: no past use or abuse, may have a drink occasionally Illicit drugs: no past use or abuse  Diet: drinks decaffeinated drinks, does not drink soda, eats salad daily for dinner, 2-3 servings of fruit daily Exercise: uses stationary bike daily for 30 minutes, weight lifts 2-3x/week  Dental exam- Dr. Judeth Horn, last cleaning Fall 2022, gets cleanings twice yearly Eye exam: followed by Dr. Tyrone Schimke, last exam 2022, no evidence of diabetic retinopathy  Living will complete, does not have DNR or HPOA.    Review of Systems:  Review of Systems  Constitutional:  Positive for malaise/fatigue. Negative for chills, fever and weight loss.  HENT:  Positive for congestion. Negative for hearing loss and  sore throat.   Eyes:  Negative for blurred vision and double vision.  Respiratory:  Negative for cough, shortness of breath and wheezing.   Cardiovascular:  Negative for chest pain and leg swelling.  Gastrointestinal:  Positive for heartburn. Negative for  abdominal pain, blood in stool, constipation, diarrhea, nausea and vomiting.  Genitourinary:  Positive for frequency. Negative for dysuria and hematuria.  Musculoskeletal:  Positive for back pain, falls, joint pain and myalgias.  Skin: Negative.   Neurological:  Positive for dizziness and weakness. Negative for headaches.  Endo/Heme/Allergies:  Positive for environmental allergies.  Psychiatric/Behavioral:  Negative for depression. The patient has insomnia. The patient is not nervous/anxious.    Past Medical History:  Diagnosis Date   Acute upper respiratory infections of unspecified site    Allergic rhinitis due to pollen    Arthritis    Benign essential hypertension    Bursitis    Cervical spondylosis without myelopathy    Cervicalgia    Controlled type 2 diabetes mellitus without complication, without long-term current use of insulin ([place]) 10/01/2019   Diaphragmatic hernia without mention of obstruction or gangrene    Disorder of bone and cartilage, unspecified    Diverticulosis of colon (without mention of hemorrhage)    Encounter for long-term (current) use of other medications    GERD (gastroesophageal reflux disease)    Glaucoma    High blood sugar    History of bone density study    History of colonoscopy    History of echocardiogram    History of hiatal hernia    History of Papanicolaou smear of cervix    Hyperlipidemia LDL goal < 100    Hypertension    Keratoconjunctivitis sicca, 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's    Other and unspecified hyperlipidemia    Pain in joint, site unspecified    Pre-diabetes    Routine general medical examination at a health care facility    Sinusitis    Sjogren's syndrome ([place]) 10/27/2012   Tear film insufficiency, unspecified    Unspecified disorder of kidney and ureter    Unspecified glaucoma(365.9)    Past Surgical History:  Procedure Laterality Date   BACK SURGERY     BELPHAROPTOSIS REPAIR     brow lift   bil   CATARACT EXTRACTION W/  INTRAOCULAR LENS  IMPLANT, BILATERAL Bilateral 12/2014   Dr. Prudencio Burly   CHOLECYSTECTOMY  1993   Dr.Lindsey    DILATION AND CURETTAGE OF UTERUS     GALLBLADDER SURGERY     LUMBAR LAMINECTOMY/DECOMPRESSION MICRODISCECTOMY N/A 04/21/2017   Procedure: BILATERAL LUMBAR FOUR- LUMBAR FIVE AND LEFT LUMBAR FIVE- SACRAL ONE LAMINOTOMY, MEDIAL FACETECTOMY AND FORAMINOTOMIES;  Surgeon: Jovita Gamma, MD;  Location: [place];  Service: Neurosurgery;  Laterality: N/A;   NASAL SEPTUM SURGERY     Social History:   reports that she has never smoked. She has never used smokeless tobacco. She reports current alcohol use. She reports that she does not use drugs.  Family History  Problem Relation Age of Onset   Heart attack Mother    Breast cancer Mother    Heart failure Mother    Pancreatitis Father    Heart disease Brother        By-pass surgery   Lupus Cousin     Medications: Patient's Medications  New Prescriptions   No medications on file  Previous Medications   ASPIRIN EC 81 MG TABLET    Take 1 tablet (81 mg total) by mouth daily. Swallow whole.  BRINZOLAMIDE (AZOPT) 1 % OPHTHALMIC SUSPENSION    Place 1 drop into both eyes 2 (two) times daily.   CALCIUM CITRATE-VITAMIN D (CITRACAL+D) 315-200 MG-UNIT PER TABLET    Take 1 tablet by mouth 4 (four) times daily.   CHLORPHENIRAMINE (CHLOR-TRIMETON) 4 MG TABLET    Take 4 mg by mouth daily.   CHOLECALCIFEROL (VITAMIN D) 1000 UNITS TABLET    Take 2,000 Units by mouth daily.    EZETIMIBE (ZETIA) 10 MG TABLET    Take 1 tablet (10 mg total) by mouth daily.   LOSARTAN (COZAAR) 50 MG TABLET    Take one tablet by mouth once daily for blood pressure   PANTOPRAZOLE (PROTONIX) 40 MG TABLET    Take 1 tablet (40 mg total) by mouth daily before breakfast.   ROSUVASTATIN (CRESTOR) 10 MG TABLET    Take 1 tablet (10 mg total) by mouth daily.  Modified Medications   No medications on file  Discontinued Medications   No medications on file    Physical  Exam:  Vitals:   05/21/21 0926  BP: 124/78  Pulse: 87  Temp: (!) 96.9 F (36.1 C)  TempSrc: Temporal  SpO2: 97%  Weight: 163 lb (73.9 kg)  Height: 5\' 6"  (1.676 m)   Body mass index is 26.31 kg/m. Wt Readings from Last 3 Encounters:  05/21/21 163 lb (73.9 kg)  04/15/21 161 lb (73 kg)  03/04/21 163 lb (73.9 kg)    Physical Exam Vitals reviewed.  Constitutional:      General: She is not in acute distress. HENT:     Head: Normocephalic.  Eyes:     General:        Right eye: No discharge.        Left eye: No discharge.  Neck:     Vascular: No carotid bruit.  Cardiovascular:     Rate and Rhythm: Normal rate and regular rhythm.     Pulses: Normal pulses.     Heart sounds: Normal heart sounds. No murmur heard. Pulmonary:     Effort: Pulmonary effort is normal. No respiratory distress.     Breath sounds: Normal breath sounds. No wheezing.  Abdominal:     General: Bowel sounds are normal. There is no distension.     Palpations: Abdomen is soft.     Tenderness: There is no abdominal tenderness.  Musculoskeletal:     Cervical back: Neck supple.     Right lower leg: No edema.     Left lower leg: No edema.  Lymphadenopathy:     Cervical: No cervical adenopathy.  Skin:    General: Skin is warm and dry.     Capillary Refill: Capillary refill takes less than 2 seconds.  Neurological:     General: No focal deficit present.     Mental Status: She is alert and oriented to person, place, and time.     Motor: No weakness.     Gait: Gait normal.  Psychiatric:        Mood and Affect: Mood normal.        Behavior: Behavior normal.    Labs reviewed: Basic Metabolic Panel: Recent Labs    06/09/20 0828  NA 136  K 4.2  CL 100  CO2 27  GLUCOSE 101*  BUN 13  CREATININE 0.72  CALCIUM 9.5   Liver Function Tests: No results for input(s): AST, ALT, ALKPHOS, BILITOT, PROT, ALBUMIN in the last 8760 hours. No results for input(s): LIPASE, AMYLASE in the last 8760  hours. No  results for input(s): AMMONIA in the last 8760 hours. CBC: No results for input(s): WBC, NEUTROABS, HGB, HCT, MCV, PLT in the last 8760 hours. Lipid Panel: Recent Labs    06/09/20 0828 08/12/20 0805 12/18/20 0813  CHOL 140 146 115  HDL 49* 46 45  LDLCALC 69 75 51  TRIG 132 145 99  CHOLHDL 2.9 3.2 2.6   TSH: No results for input(s): TSH in the last 8760 hours. A1C: Lab Results  Component Value Date   HGBA1C 6.0 (H) 06/09/2020     Assessment/Plan 1. Benign essential hypertension - controlled - cont losartan - CBC with Differential/Platelet; Future - CMP; Future  2. Mixed hyperlipidemia - LDL 51 12/2020 - cont crestor - Lipid Panel; Future  3. Coronary artery disease involving native coronary artery of native heart without angina pectoris - cont asa and statin  4. Generalized osteoarthritis of multiple sites - involves upper extremities - improves with movement  5. Controlled type 2 diabetes mellitus without complication, without long-term current use of insulin (HCC) - A1c 6.0 05/2020 - not on medication - cont asa, statin, and ARB - cont to limit diet in sugars and carbs - diabetic eye exam 2022 - Hemoglobin A1c; Future  6. Trochanteric bursitis of both hips - followed by Dr. Ninfa Linden - reports increased pain - recommend tylenol 1000 mg po bid prn  7. Syncope, unspecified syncope type - 04/2021- fell at airport - f/u with cardiology - LVEF> 65% - Zio patch results not posted  8. Glaucoma of both eyes, unspecified glaucoma type - followed by Dr. Prudencio Burly - cont Azopt drops  9. Gastroesophageal reflux disease, unspecified whether esophagitis present - cont Protonix  10. Sjogren's syndrome with keratoconjunctivitis sicca ([place]) -not followed by specialist - cont biotene and artificial tears  11. Environmental allergies - cont chlorpheniramine  12. Fatigue, unspecified type - feels more tired, napping more - TSH; Future  Future labs/tests: MMSE,  discuss mammogram and DEXA  Total time: 51 minutes. Greater than 50% of total time spent doing patient education regarding, health maintenance, symptom management and medication management.   Next appt: 06/25/2021  Windell Moulding, [facility] [phone]

## 2021-05-21 NOTE — Patient Instructions (Addendum)
Schedule appointment with Dr. Ninfa Linden  May try tylenol 1000 mg daily for arthritic pain  Look into healthcare power of attorney paperwork

## 2021-05-22 ENCOUNTER — Other Ambulatory Visit: Payer: Self-pay | Admitting: Orthopedic Surgery

## 2021-05-22 DIAGNOSIS — R5383 Other fatigue: Secondary | ICD-10-CM

## 2021-05-22 DIAGNOSIS — I10 Essential (primary) hypertension: Secondary | ICD-10-CM

## 2021-05-22 DIAGNOSIS — E782 Mixed hyperlipidemia: Secondary | ICD-10-CM

## 2021-05-22 DIAGNOSIS — E119 Type 2 diabetes mellitus without complications: Secondary | ICD-10-CM

## 2021-06-02 DIAGNOSIS — M9902 Segmental and somatic dysfunction of thoracic region: Secondary | ICD-10-CM | POA: Diagnosis not present

## 2021-06-02 DIAGNOSIS — M9904 Segmental and somatic dysfunction of sacral region: Secondary | ICD-10-CM | POA: Diagnosis not present

## 2021-06-02 DIAGNOSIS — M5134 Other intervertebral disc degeneration, thoracic region: Secondary | ICD-10-CM | POA: Diagnosis not present

## 2021-06-02 DIAGNOSIS — M5136 Other intervertebral disc degeneration, lumbar region: Secondary | ICD-10-CM | POA: Diagnosis not present

## 2021-06-10 ENCOUNTER — Encounter: Payer: Self-pay | Admitting: Orthopaedic Surgery

## 2021-06-10 ENCOUNTER — Ambulatory Visit: Payer: Medicare Other | Admitting: Orthopaedic Surgery

## 2021-06-10 DIAGNOSIS — M7061 Trochanteric bursitis, right hip: Secondary | ICD-10-CM

## 2021-06-10 NOTE — Progress Notes (Signed)
The patient is a 76 year old female well-known to me.  She has chronic bilateral hip trochanteric bursitis and the right is worse than the left.  She said that the physical therapy exercises are not really helping and she had already been through physical therapy.  She denies any pain in the groin. ? ?On exam both hips move smoothly and fluidly with no pain in the groin at all.  There is mainly pain over the right trochanteric area and the proximal IT band.  Previous x-rays of her pelvis and both hips were normal. ? ?I am going to hold off on any steroid injections today since she is leaving for a cruise at the end of April.  I need to see her back about a week before that cruise because I think that will help her the most in terms of a steroid injection and she agrees with this as well.  She will reschedule. ?

## 2021-06-15 DIAGNOSIS — M5136 Other intervertebral disc degeneration, lumbar region: Secondary | ICD-10-CM | POA: Diagnosis not present

## 2021-06-15 DIAGNOSIS — M5134 Other intervertebral disc degeneration, thoracic region: Secondary | ICD-10-CM | POA: Diagnosis not present

## 2021-06-15 DIAGNOSIS — M9902 Segmental and somatic dysfunction of thoracic region: Secondary | ICD-10-CM | POA: Diagnosis not present

## 2021-06-15 DIAGNOSIS — M9904 Segmental and somatic dysfunction of sacral region: Secondary | ICD-10-CM | POA: Diagnosis not present

## 2021-06-22 ENCOUNTER — Other Ambulatory Visit: Payer: Medicare Other

## 2021-06-22 ENCOUNTER — Other Ambulatory Visit: Payer: Self-pay

## 2021-06-22 DIAGNOSIS — I10 Essential (primary) hypertension: Secondary | ICD-10-CM | POA: Diagnosis not present

## 2021-06-22 DIAGNOSIS — E119 Type 2 diabetes mellitus without complications: Secondary | ICD-10-CM | POA: Diagnosis not present

## 2021-06-22 DIAGNOSIS — R5383 Other fatigue: Secondary | ICD-10-CM | POA: Diagnosis not present

## 2021-06-22 DIAGNOSIS — E782 Mixed hyperlipidemia: Secondary | ICD-10-CM | POA: Diagnosis not present

## 2021-06-23 LAB — CBC WITH DIFFERENTIAL/PLATELET
Absolute Monocytes: 569 cells/uL (ref 200–950)
Basophils Absolute: 88 cells/uL (ref 0–200)
Basophils Relative: 1.2 %
Eosinophils Absolute: 350 cells/uL (ref 15–500)
Eosinophils Relative: 4.8 %
HCT: 47.6 % — ABNORMAL HIGH (ref 35.0–45.0)
Hemoglobin: 15.4 g/dL (ref 11.7–15.5)
Lymphs Abs: 2029 cells/uL (ref 850–3900)
MCH: 30 pg (ref 27.0–33.0)
MCHC: 32.4 g/dL (ref 32.0–36.0)
MCV: 92.6 fL (ref 80.0–100.0)
MPV: 10.4 fL (ref 7.5–12.5)
Monocytes Relative: 7.8 %
Neutro Abs: 4263 cells/uL (ref 1500–7800)
Neutrophils Relative %: 58.4 %
Platelets: 251 10*3/uL (ref 140–400)
RBC: 5.14 10*6/uL — ABNORMAL HIGH (ref 3.80–5.10)
RDW: 11.9 % (ref 11.0–15.0)
Total Lymphocyte: 27.8 %
WBC: 7.3 10*3/uL (ref 3.8–10.8)

## 2021-06-23 LAB — HEMOGLOBIN A1C
Hgb A1c MFr Bld: 6.1 % of total Hgb — ABNORMAL HIGH (ref <5.7)
Mean Plasma Glucose: 128 mg/dL
eAG (mmol/L): 7.1 mmol/L

## 2021-06-23 LAB — COMPREHENSIVE METABOLIC PANEL
AG Ratio: 1.8 (calc) (ref 1.0–2.5)
ALT: 16 U/L (ref 6–29)
AST: 17 U/L (ref 10–35)
Albumin: 4.5 g/dL (ref 3.6–5.1)
Alkaline phosphatase (APISO): 55 U/L (ref 37–153)
BUN: 10 mg/dL (ref 7–25)
CO2: 28 mmol/L (ref 20–32)
Calcium: 10 mg/dL (ref 8.6–10.4)
Chloride: 102 mmol/L (ref 98–110)
Creat: 0.75 mg/dL (ref 0.60–1.00)
Globulin: 2.5 g/dL (calc) (ref 1.9–3.7)
Glucose, Bld: 116 mg/dL — ABNORMAL HIGH (ref 65–99)
Potassium: 4.5 mmol/L (ref 3.5–5.3)
Sodium: 138 mmol/L (ref 135–146)
Total Bilirubin: 1 mg/dL (ref 0.2–1.2)
Total Protein: 7 g/dL (ref 6.1–8.1)

## 2021-06-23 LAB — LIPID PANEL
Cholesterol: 119 mg/dL (ref <200)
HDL: 43 mg/dL — ABNORMAL LOW (ref >=50)
LDL Cholesterol (Calc): 54 mg/dL (calc)
Non-HDL Cholesterol (Calc): 76 mg/dL (calc) (ref <130)
Total CHOL/HDL Ratio: 2.8 (calc) (ref <5.0)
Triglycerides: 137 mg/dL (ref <150)

## 2021-06-23 LAB — TSH: TSH: 3.88 mIU/L (ref 0.40–4.50)

## 2021-06-24 ENCOUNTER — Encounter: Payer: Self-pay | Admitting: Orthopedic Surgery

## 2021-06-25 ENCOUNTER — Other Ambulatory Visit: Payer: Self-pay

## 2021-06-25 ENCOUNTER — Ambulatory Visit (INDEPENDENT_AMBULATORY_CARE_PROVIDER_SITE_OTHER): Payer: Medicare Other | Admitting: Orthopedic Surgery

## 2021-06-25 ENCOUNTER — Encounter: Payer: Self-pay | Admitting: Orthopedic Surgery

## 2021-06-25 VITALS — BP 126/80 | HR 85 | Temp 97.1°F | Ht 66.0 in | Wt 160.2 lb

## 2021-06-25 DIAGNOSIS — E782 Mixed hyperlipidemia: Secondary | ICD-10-CM

## 2021-06-25 DIAGNOSIS — M159 Polyosteoarthritis, unspecified: Secondary | ICD-10-CM

## 2021-06-25 DIAGNOSIS — K219 Gastro-esophageal reflux disease without esophagitis: Secondary | ICD-10-CM

## 2021-06-25 DIAGNOSIS — I2510 Atherosclerotic heart disease of native coronary artery without angina pectoris: Secondary | ICD-10-CM

## 2021-06-25 DIAGNOSIS — I10 Essential (primary) hypertension: Secondary | ICD-10-CM | POA: Diagnosis not present

## 2021-06-25 DIAGNOSIS — M7061 Trochanteric bursitis, right hip: Secondary | ICD-10-CM | POA: Diagnosis not present

## 2021-06-25 DIAGNOSIS — E119 Type 2 diabetes mellitus without complications: Secondary | ICD-10-CM | POA: Diagnosis not present

## 2021-06-25 DIAGNOSIS — M7062 Trochanteric bursitis, left hip: Secondary | ICD-10-CM | POA: Diagnosis not present

## 2021-06-25 NOTE — Patient Instructions (Addendum)
Diabetic eye exam- please have Dr. Prudencio Burly send note to [facility] ? ? ? ? ?

## 2021-06-25 NOTE — Progress Notes (Signed)
? ? ?Careteam: ?Patient Care Team: ?Yvonna Alanis, NP as PCP - General (Adult Health Nurse Practitioner) ?Donato Heinz, MD as PCP - Cardiology (Cardiology) ?Katy Apo, MD as Consulting Physician (Ophthalmology) ? ?Seen by: Windell Moulding, AGNP-C ? ?PLACE OF SERVICE:  ?[facility]  ?Advanced Directive information ?  ? ?Allergies  ?Allergen Reactions  ? Statins Other (See Comments)  ?  Muscle problems  ? Allopurinol Rash  ? Darvocet [Propoxyphene N-Acetaminophen] Rash  ? ? ?Chief Complaint  ?Patient presents with  ? Follow-up  ?  Patient present today for 4 week follow up. MMSE score?  ? Quality Metric Gaps  ?  Foot exam today.  ?Discuss the need for flu vaccine and eye exam, or post pone if patient refuses.   ? ? ? ?HPI: Patient is a 76 y.o. female seen today for medical management of chronic conditions.  ? ?Lab results discussed with patient.  ? ?No health concerns today.  ? ?Admits to snacking, especially likes breads. Discussed diabetes and dietary precautions. Foot exam done today. Diabetic eye exam scheduled with Dr/ Prudencio Burly next week.  ? ?Recently seen by Dr. Ninfa Linden for right hip bursitis. X rays both hips normal. Plan for steroid injection end of April 2023. Hip pain intermittent taking tylenol prn for pain.  ? ?MMSE 30/30, correct clock and shapes.  ? ?Review of Systems:  ?Review of Systems  ?Constitutional:  Negative for chills, fever, malaise/fatigue and weight loss.  ?HENT:  Negative for hearing loss and sore throat.   ?Eyes:  Negative for discharge and redness.  ?Respiratory:  Negative for cough, shortness of breath and wheezing.   ?Cardiovascular:  Negative for chest pain and leg swelling.  ?Gastrointestinal:  Negative for abdominal pain, constipation, diarrhea, heartburn, nausea and vomiting.  ?Genitourinary:  Negative for dysuria, frequency and hematuria.  ?Musculoskeletal:  Negative for falls and joint pain.  ?Skin:  Negative for rash.  ?Neurological:  Negative for dizziness, tingling and  weakness.  ?Psychiatric/Behavioral:  Negative for depression. The patient is not nervous/anxious and does not have insomnia.   ? ?Past Medical History:  ?Diagnosis Date  ? Acute upper respiratory infections of unspecified site   ? Allergic rhinitis due to pollen   ? Arthritis   ? Benign essential hypertension   ? Bursitis   ? Cervical spondylosis without myelopathy   ? Cervicalgia   ? Controlled type 2 diabetes mellitus without complication, without long-term current use of insulin ([place]) 10/01/2019  ? Diaphragmatic hernia without mention of obstruction or gangrene   ? Disorder of bone and cartilage, unspecified   ? Diverticulosis of colon (without mention of hemorrhage)   ? Encounter for long-term (current) use of other medications   ? GERD (gastroesophageal reflux disease)   ? Glaucoma   ? High blood sugar   ? History of bone density study   ? History of colonoscopy   ? History of echocardiogram   ? History of hiatal hernia   ? History of Papanicolaou smear of cervix   ? Hyperlipidemia LDL goal < 100   ? Hypertension   ? Keratoconjunctivitis sicca, 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's   ? Other and unspecified hyperlipidemia   ? Pain in joint, site unspecified   ? Pre-diabetes   ? Routine general medical examination at a health care facility   ? Sinusitis   ? Sjogren's syndrome ([place]) 10/27/2012  ? Tear film insufficiency, unspecified   ? Unspecified disorder of kidney and ureter   ? Unspecified glaucoma(365.9)   ? ?  Past Surgical History:  ?Procedure Laterality Date  ? BACK SURGERY    ? [place]    ? brow lift   bil  ? CATARACT EXTRACTION W/ INTRAOCULAR LENS  IMPLANT, BILATERAL Bilateral 12/2014  ? Dr. Prudencio Burly  ? CHOLECYSTECTOMY  1993  ? Dr.Lindsey   ? DILATION AND CURETTAGE OF UTERUS    ? GALLBLADDER SURGERY    ? LUMBAR LAMINECTOMY/DECOMPRESSION MICRODISCECTOMY N/A 04/21/2017  ? Procedure: BILATERAL LUMBAR FOUR- LUMBAR FIVE AND LEFT LUMBAR FIVE- SACRAL ONE LAMINOTOMY, MEDIAL FACETECTOMY AND FORAMINOTOMIES;   Surgeon: Jovita Gamma, MD;  Location: [place];  Service: Neurosurgery;  Laterality: N/A;  ? NASAL SEPTUM SURGERY    ? ?Social History: ?  reports that she has never smoked. She has never used smokeless tobacco. She reports current alcohol use. She reports that she does not use drugs. ? ?Family History  ?Problem Relation Age of Onset  ? Heart attack Mother   ? Breast cancer Mother   ? Heart failure Mother   ? Pancreatitis Father   ? Heart disease Brother   ?     By-pass surgery  ? Lupus Cousin   ? ? ?Medications: ?Patient's Medications  ?New Prescriptions  ? No medications on file  ?Previous Medications  ? ASPIRIN EC 81 MG TABLET    Take 1 tablet (81 mg total) by mouth daily. Swallow whole.  ? BRINZOLAMIDE (AZOPT) 1 % OPHTHALMIC SUSPENSION    Place 1 drop into both eyes 2 (two) times daily.  ? CALCIUM CITRATE-VITAMIN D (CITRACAL+D) 315-200 MG-UNIT PER TABLET    Take 1 tablet by mouth 4 (four) times daily.  ? CHLORPHENIRAMINE (CHLOR-TRIMETON) 4 MG TABLET    Take 4 mg by mouth daily.  ? CHOLECALCIFEROL (VITAMIN D) 1000 UNITS TABLET    Take 2,000 Units by mouth daily.   ? EZETIMIBE (ZETIA) 10 MG TABLET    Take 1 tablet (10 mg total) by mouth daily.  ? LOSARTAN (COZAAR) 50 MG TABLET    Take one tablet by mouth once daily for blood pressure  ? PANTOPRAZOLE (PROTONIX) 40 MG TABLET    Take 1 tablet (40 mg total) by mouth daily before breakfast.  ? ROSUVASTATIN (CRESTOR) 10 MG TABLET    Take 1 tablet (10 mg total) by mouth daily.  ?Modified Medications  ? No medications on file  ?Discontinued Medications  ? No medications on file  ? ? ?Physical Exam: ? ?There were no vitals filed for this visit. ?There is no height or weight on file to calculate BMI. ?Wt Readings from Last 3 Encounters:  ?05/21/21 163 lb (73.9 kg)  ?04/15/21 161 lb (73 kg)  ?03/04/21 163 lb (73.9 kg)  ? ? ?Physical Exam ?Vitals reviewed.  ?Constitutional:   ?   General: She is not in acute distress. ?HENT:  ?   Head: Normocephalic.  ?   Right Ear: There is  no impacted cerumen.  ?   Left Ear: There is no impacted cerumen.  ?   Nose: Nose normal.  ?   Mouth/Throat:  ?   Mouth: Mucous membranes are moist.  ?Eyes:  ?   General:     ?   Right eye: No discharge.     ?   Left eye: No discharge.  ?Neck:  ?   Vascular: No carotid bruit.  ?Cardiovascular:  ?   Rate and Rhythm: Normal rate and regular rhythm.  ?   Pulses:     ?     Dorsalis pedis  pulses are 1+ on the right side and 1+ on the left side.  ?     Posterior tibial pulses are 1+ on the right side and 1+ on the left side.  ?   Heart sounds: Normal heart sounds.  ?Pulmonary:  ?   Effort: Pulmonary effort is normal. No respiratory distress.  ?   Breath sounds: Normal breath sounds. No wheezing.  ?Abdominal:  ?   General: Bowel sounds are normal. There is no distension.  ?   Palpations: Abdomen is soft.  ?   Tenderness: There is no abdominal tenderness.  ?Musculoskeletal:  ?   Cervical back: Neck supple.  ?   Right lower leg: No edema.  ?   Left lower leg: No edema.  ?Feet:  ?   Right foot:  ?   Protective Sensation: 10 sites tested.  10 sites sensed.  ?   Skin integrity: Skin integrity normal.  ?   Toenail Condition: Right toenails are normal.  ?   Left foot:  ?   Protective Sensation: 10 sites tested.  10 sites sensed.  ?   Skin integrity: Skin integrity normal.  ?   Toenail Condition: Left toenails are normal.  ?Lymphadenopathy:  ?   Cervical: No cervical adenopathy.  ?Skin: ?   General: Skin is warm and dry.  ?   Capillary Refill: Capillary refill takes less than 2 seconds.  ?Neurological:  ?   General: No focal deficit present.  ?   Mental Status: She is alert and oriented to person, place, and time.  ?Psychiatric:     ?   Mood and Affect: Mood normal.     ?   Behavior: Behavior normal.  ? ? ?Labs reviewed: ?Basic Metabolic Panel: ?Recent Labs  ?  06/22/21 ?4580  ?NA 138  ?K 4.5  ?CL 102  ?CO2 28  ?GLUCOSE 116*  ?BUN 10  ?CREATININE 0.75  ?CALCIUM 10.0  ?TSH 3.88  ? ?Liver Function Tests: ?Recent Labs  ?   06/22/21 ?0807  ?AST 17  ?ALT 16  ?BILITOT 1.0  ?PROT 7.0  ? ?No results for input(s): LIPASE, AMYLASE in the last 8760 hours. ?No results for input(s): AMMONIA in the last 8760 hours. ?CBC: ?Recent Labs  ?  06/22/21 ?0

## 2021-06-29 DIAGNOSIS — M9902 Segmental and somatic dysfunction of thoracic region: Secondary | ICD-10-CM | POA: Diagnosis not present

## 2021-06-29 DIAGNOSIS — Z961 Presence of intraocular lens: Secondary | ICD-10-CM | POA: Diagnosis not present

## 2021-06-29 DIAGNOSIS — M5136 Other intervertebral disc degeneration, lumbar region: Secondary | ICD-10-CM | POA: Diagnosis not present

## 2021-06-29 DIAGNOSIS — M5134 Other intervertebral disc degeneration, thoracic region: Secondary | ICD-10-CM | POA: Diagnosis not present

## 2021-06-29 DIAGNOSIS — H26491 Other secondary cataract, right eye: Secondary | ICD-10-CM | POA: Diagnosis not present

## 2021-06-29 DIAGNOSIS — H401132 Primary open-angle glaucoma, bilateral, moderate stage: Secondary | ICD-10-CM | POA: Diagnosis not present

## 2021-06-29 DIAGNOSIS — M9904 Segmental and somatic dysfunction of sacral region: Secondary | ICD-10-CM | POA: Diagnosis not present

## 2021-07-09 ENCOUNTER — Other Ambulatory Visit: Payer: Self-pay | Admitting: Cardiology

## 2021-07-09 DIAGNOSIS — M5136 Other intervertebral disc degeneration, lumbar region: Secondary | ICD-10-CM | POA: Diagnosis not present

## 2021-07-09 DIAGNOSIS — M9902 Segmental and somatic dysfunction of thoracic region: Secondary | ICD-10-CM | POA: Diagnosis not present

## 2021-07-09 DIAGNOSIS — M5134 Other intervertebral disc degeneration, thoracic region: Secondary | ICD-10-CM | POA: Diagnosis not present

## 2021-07-09 DIAGNOSIS — M9904 Segmental and somatic dysfunction of sacral region: Secondary | ICD-10-CM | POA: Diagnosis not present

## 2021-07-14 DIAGNOSIS — M5134 Other intervertebral disc degeneration, thoracic region: Secondary | ICD-10-CM | POA: Diagnosis not present

## 2021-07-14 DIAGNOSIS — M9904 Segmental and somatic dysfunction of sacral region: Secondary | ICD-10-CM | POA: Diagnosis not present

## 2021-07-14 DIAGNOSIS — M5136 Other intervertebral disc degeneration, lumbar region: Secondary | ICD-10-CM | POA: Diagnosis not present

## 2021-07-14 DIAGNOSIS — M9902 Segmental and somatic dysfunction of thoracic region: Secondary | ICD-10-CM | POA: Diagnosis not present

## 2021-07-16 DIAGNOSIS — H26491 Other secondary cataract, right eye: Secondary | ICD-10-CM | POA: Diagnosis not present

## 2021-07-23 DIAGNOSIS — M5136 Other intervertebral disc degeneration, lumbar region: Secondary | ICD-10-CM | POA: Diagnosis not present

## 2021-07-23 DIAGNOSIS — M5134 Other intervertebral disc degeneration, thoracic region: Secondary | ICD-10-CM | POA: Diagnosis not present

## 2021-07-23 DIAGNOSIS — M9902 Segmental and somatic dysfunction of thoracic region: Secondary | ICD-10-CM | POA: Diagnosis not present

## 2021-07-23 DIAGNOSIS — M9904 Segmental and somatic dysfunction of sacral region: Secondary | ICD-10-CM | POA: Diagnosis not present

## 2021-07-27 ENCOUNTER — Ambulatory Visit: Payer: Medicare Other | Admitting: Orthopaedic Surgery

## 2021-07-29 DIAGNOSIS — L821 Other seborrheic keratosis: Secondary | ICD-10-CM | POA: Diagnosis not present

## 2021-07-29 DIAGNOSIS — D225 Melanocytic nevi of trunk: Secondary | ICD-10-CM | POA: Diagnosis not present

## 2021-08-06 DIAGNOSIS — M5134 Other intervertebral disc degeneration, thoracic region: Secondary | ICD-10-CM | POA: Diagnosis not present

## 2021-08-06 DIAGNOSIS — M9904 Segmental and somatic dysfunction of sacral region: Secondary | ICD-10-CM | POA: Diagnosis not present

## 2021-08-06 DIAGNOSIS — M9902 Segmental and somatic dysfunction of thoracic region: Secondary | ICD-10-CM | POA: Diagnosis not present

## 2021-08-06 DIAGNOSIS — M5136 Other intervertebral disc degeneration, lumbar region: Secondary | ICD-10-CM | POA: Diagnosis not present

## 2021-08-12 DIAGNOSIS — J019 Acute sinusitis, unspecified: Secondary | ICD-10-CM | POA: Diagnosis not present

## 2021-08-19 DIAGNOSIS — M5134 Other intervertebral disc degeneration, thoracic region: Secondary | ICD-10-CM | POA: Diagnosis not present

## 2021-08-19 DIAGNOSIS — M5136 Other intervertebral disc degeneration, lumbar region: Secondary | ICD-10-CM | POA: Diagnosis not present

## 2021-08-19 DIAGNOSIS — M9902 Segmental and somatic dysfunction of thoracic region: Secondary | ICD-10-CM | POA: Diagnosis not present

## 2021-08-19 DIAGNOSIS — M9904 Segmental and somatic dysfunction of sacral region: Secondary | ICD-10-CM | POA: Diagnosis not present

## 2021-08-20 ENCOUNTER — Other Ambulatory Visit: Payer: Self-pay | Admitting: Orthopedic Surgery

## 2021-08-20 DIAGNOSIS — E782 Mixed hyperlipidemia: Secondary | ICD-10-CM

## 2021-08-20 DIAGNOSIS — E119 Type 2 diabetes mellitus without complications: Secondary | ICD-10-CM

## 2021-08-20 DIAGNOSIS — H401132 Primary open-angle glaucoma, bilateral, moderate stage: Secondary | ICD-10-CM | POA: Diagnosis not present

## 2021-08-20 DIAGNOSIS — I10 Essential (primary) hypertension: Secondary | ICD-10-CM

## 2021-08-31 DIAGNOSIS — M5136 Other intervertebral disc degeneration, lumbar region: Secondary | ICD-10-CM | POA: Diagnosis not present

## 2021-08-31 DIAGNOSIS — M9904 Segmental and somatic dysfunction of sacral region: Secondary | ICD-10-CM | POA: Diagnosis not present

## 2021-08-31 DIAGNOSIS — M9905 Segmental and somatic dysfunction of pelvic region: Secondary | ICD-10-CM | POA: Diagnosis not present

## 2021-08-31 DIAGNOSIS — M9903 Segmental and somatic dysfunction of lumbar region: Secondary | ICD-10-CM | POA: Diagnosis not present

## 2021-09-10 DIAGNOSIS — M5136 Other intervertebral disc degeneration, lumbar region: Secondary | ICD-10-CM | POA: Diagnosis not present

## 2021-09-10 DIAGNOSIS — M9905 Segmental and somatic dysfunction of pelvic region: Secondary | ICD-10-CM | POA: Diagnosis not present

## 2021-09-10 DIAGNOSIS — M9903 Segmental and somatic dysfunction of lumbar region: Secondary | ICD-10-CM | POA: Diagnosis not present

## 2021-09-10 DIAGNOSIS — M9904 Segmental and somatic dysfunction of sacral region: Secondary | ICD-10-CM | POA: Diagnosis not present

## 2021-09-16 ENCOUNTER — Encounter: Payer: Self-pay | Admitting: Orthopedic Surgery

## 2021-09-17 ENCOUNTER — Ambulatory Visit (INDEPENDENT_AMBULATORY_CARE_PROVIDER_SITE_OTHER): Payer: Medicare Other | Admitting: Orthopedic Surgery

## 2021-09-17 VITALS — BP 126/80 | HR 76 | Temp 97.3°F | Ht 66.0 in | Wt 161.4 lb

## 2021-09-17 DIAGNOSIS — R350 Frequency of micturition: Secondary | ICD-10-CM

## 2021-09-17 LAB — POCT URINALYSIS DIPSTICK
Bilirubin, UA: NEGATIVE
Glucose, UA: NEGATIVE
Ketones, UA: NEGATIVE
Leukocytes, UA: NEGATIVE
Nitrite, UA: NEGATIVE
Protein, UA: NEGATIVE
Spec Grav, UA: 1.015 (ref 1.010–1.025)
Urobilinogen, UA: 0.2 E.U./dL
pH, UA: 5 (ref 5.0–8.0)

## 2021-09-17 NOTE — Patient Instructions (Signed)
Recommend seeing Dr. Ruby Cola if urine culture is negative.   May try stopping cranberry pill for a few days to see if symptoms improve.

## 2021-09-17 NOTE — Progress Notes (Signed)
Careteam: Patient Care Team: Yvonna Alanis, NP as PCP - General (Adult Health Nurse Practitioner) Donato Heinz, MD as PCP - Cardiology (Cardiology) Katy Apo, MD as Consulting Physician (Ophthalmology)  Seen by: Windell Moulding, AGNP-C  PLACE OF SERVICE:  [place]  Advanced Directive information    Allergies  Allergen Reactions   Statins Other (See Comments)    Muscle problems   Allopurinol Rash   Darvocet [Propoxyphene N-Acetaminophen] Rash    Chief Complaint  Patient presents with   Acute Visit    UTI symptoms. Patient stated that she has been experiencing urine frequency. No pain nor burning and no four odor.      HPI: Patient is a 76 y.o. female seen today for acute visit due to urinary frequency.   She started having urinary frequency > 1 week. Denies fever, flank pain, and dysuria. She is taking cranberry supplement daily. Admits to drinking fluids well. She is followed by Dr. Ruby Cola- urogynecology for urinary urgency. Unsuccessful trial of Gemteza in past.    Review of Systems:  Review of Systems  Constitutional:  Negative for chills, fever, malaise/fatigue and weight loss.  Respiratory:  Negative for cough, shortness of breath and wheezing.   Cardiovascular:  Negative for chest pain and leg swelling.  Genitourinary:  Positive for frequency and urgency. Negative for dysuria, flank pain and hematuria.  Psychiatric/Behavioral:  Negative for depression. The patient is not nervous/anxious.     Past Medical History:  Diagnosis Date   Acute upper respiratory infections of unspecified site    Allergic rhinitis due to pollen    Arthritis    Benign essential hypertension    Bursitis    Cervical spondylosis without myelopathy    Cervicalgia    Controlled type 2 diabetes mellitus without complication, without long-term current use of insulin ([place]) 10/01/2019   Diaphragmatic hernia without mention of obstruction or gangrene    Disorder of bone and  cartilage, unspecified    Diverticulosis of colon (without mention of hemorrhage)    Encounter for long-term (current) use of other medications    GERD (gastroesophageal reflux disease)    Glaucoma    High blood sugar    History of bone density study    History of colonoscopy    History of echocardiogram    History of hiatal hernia    History of Papanicolaou smear of cervix    Hyperlipidemia LDL goal < 100    Hypertension    Keratoconjunctivitis sicca, 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's    Other and unspecified hyperlipidemia    Pain in joint, site unspecified    Pre-diabetes    Routine general medical examination at a health care facility    Sinusitis    Sjogren's syndrome ([place]) 10/27/2012   Tear film insufficiency, unspecified    Unspecified disorder of kidney and ureter    Unspecified glaucoma(365.9)    Past Surgical History:  Procedure Laterality Date   BACK SURGERY     BELPHAROPTOSIS REPAIR     brow lift   bil   CATARACT EXTRACTION W/ INTRAOCULAR LENS  IMPLANT, BILATERAL Bilateral 12/2014   Dr. Prudencio Burly   CHOLECYSTECTOMY  1993   Dr.Lindsey    DILATION AND CURETTAGE OF UTERUS     GALLBLADDER SURGERY     LUMBAR LAMINECTOMY/DECOMPRESSION MICRODISCECTOMY N/A 04/21/2017   Procedure: BILATERAL LUMBAR FOUR- LUMBAR FIVE AND LEFT LUMBAR FIVE- SACRAL ONE LAMINOTOMY, MEDIAL FACETECTOMY AND FORAMINOTOMIES;  Surgeon: Jovita Gamma, MD;  Location: [facility];  Service:  Neurosurgery;  Laterality: N/A;   NASAL SEPTUM SURGERY     Social History:   reports that she has never smoked. She has never used smokeless tobacco. She reports current alcohol use. She reports that she does not use drugs.  Family History  Problem Relation Age of Onset   Heart attack Mother    Breast cancer Mother    Heart failure Mother    Pancreatitis Father    Diabetes Brother    Heart disease Brother        By-pass surgery   Lupus Cousin     Medications: Patient's Medications  New Prescriptions   No medications  on file  Previous Medications   ASPIRIN EC 81 MG TABLET    Take 1 tablet (81 mg total) by mouth daily. Swallow whole.   BRINZOLAMIDE (AZOPT) 1 % OPHTHALMIC SUSPENSION    Place 1 drop into both eyes 2 (two) times daily.   CALCIUM CITRATE-VITAMIN D (CITRACAL+D) 315-200 MG-UNIT PER TABLET    Take 1 tablet by mouth 4 (four) times daily.   CHLORPHENIRAMINE (CHLOR-TRIMETON) 4 MG TABLET    Take 4 mg by mouth daily.   CHOLECALCIFEROL (VITAMIN D) 1000 UNITS TABLET    Take 2,000 Units by mouth daily.    EZETIMIBE (ZETIA) 10 MG TABLET    TAKE 1 TABLET BY MOUTH EVERY DAY   LOSARTAN (COZAAR) 50 MG TABLET    Take one tablet by mouth once daily for blood pressure   PANTOPRAZOLE (PROTONIX) 40 MG TABLET    Take 1 tablet (40 mg total) by mouth daily before breakfast.   ROSUVASTATIN (CRESTOR) 10 MG TABLET    Take 1 tablet (10 mg total) by mouth daily.  Modified Medications   No medications on file  Discontinued Medications   No medications on file    Physical Exam:  Vitals:   09/17/21 0937  BP: 126/80  Pulse: 76  Temp: (!) 97.3 F (36.3 C)  TempSrc: Temporal  SpO2: 98%  Weight: 161 lb 6.4 oz (73.2 kg)  Height: '5\' 6"'$  (1.676 m)   Body mass index is 26.05 kg/m. Wt Readings from Last 3 Encounters:  09/17/21 161 lb 6.4 oz (73.2 kg)  06/25/21 160 lb 3.2 oz (72.7 kg)  05/21/21 163 lb (73.9 kg)    Physical Exam Vitals reviewed.  Constitutional:      General: She is not in acute distress. HENT:     Head: Normocephalic.  Eyes:     General:        Right eye: No discharge.        Left eye: No discharge.  Cardiovascular:     Rate and Rhythm: Normal rate and regular rhythm.     Pulses: Normal pulses.     Heart sounds: Normal heart sounds.  Pulmonary:     Effort: Pulmonary effort is normal. No respiratory distress.     Breath sounds: Normal breath sounds. No wheezing.  Abdominal:     General: Bowel sounds are normal. There is no distension.     Palpations: Abdomen is soft.     Tenderness:  There is no abdominal tenderness. There is no right CVA tenderness or left CVA tenderness.  Skin:    General: Skin is warm and dry.     Capillary Refill: Capillary refill takes less than 2 seconds.  Neurological:     General: No focal deficit present.     Mental Status: She is alert and oriented to person, place, and time.  Psychiatric:  Mood and Affect: Mood normal.        Behavior: Behavior normal.     Labs reviewed: Basic Metabolic Panel: Recent Labs    06/22/21 0807  NA 138  K 4.5  CL 102  CO2 28  GLUCOSE 116*  BUN 10  CREATININE 0.75  CALCIUM 10.0  TSH 3.88   Liver Function Tests: Recent Labs    06/22/21 0807  AST 17  ALT 16  BILITOT 1.0  PROT 7.0   No results for input(s): "LIPASE", "AMYLASE" in the last 8760 hours. No results for input(s): "AMMONIA" in the last 8760 hours. CBC: Recent Labs    06/22/21 0807  WBC 7.3  NEUTROABS 4,263  HGB 15.4  HCT 47.6*  MCV 92.6  PLT 251   Lipid Panel: Recent Labs    12/18/20 0813 06/22/21 0807  CHOL 115 119  HDL 45 43*  LDLCALC 51 54  TRIG 99 137  CHOLHDL 2.6 2.8   TSH: Recent Labs    06/22/21 0807  TSH 3.88   A1C: Lab Results  Component Value Date   HGBA1C 6.1 (H) 06/22/2021     Assessment/Plan 1. Urine frequency - increased frequency > 1 week, afebrile, no flank pain or dysuria - UA (09/17/2021)- leukocytes negative, nitrates negative, blood trace - POC Urinalysis Dipstick - Culture, Urine  Total time: 20. Greater than 50% of total time spent doing patient education regarding urinary frequency and urgency.    Next appt: none Eliseo Withers [place], [facility] [phone]

## 2021-09-18 LAB — URINE CULTURE
MICRO NUMBER:: 13501611
SPECIMEN QUALITY:: ADEQUATE

## 2021-09-22 DIAGNOSIS — M9905 Segmental and somatic dysfunction of pelvic region: Secondary | ICD-10-CM | POA: Diagnosis not present

## 2021-09-22 DIAGNOSIS — M9904 Segmental and somatic dysfunction of sacral region: Secondary | ICD-10-CM | POA: Diagnosis not present

## 2021-09-22 DIAGNOSIS — M9903 Segmental and somatic dysfunction of lumbar region: Secondary | ICD-10-CM | POA: Diagnosis not present

## 2021-09-22 DIAGNOSIS — M5136 Other intervertebral disc degeneration, lumbar region: Secondary | ICD-10-CM | POA: Diagnosis not present

## 2021-09-24 DIAGNOSIS — Z1231 Encounter for screening mammogram for malignant neoplasm of breast: Secondary | ICD-10-CM | POA: Diagnosis not present

## 2021-10-01 DIAGNOSIS — M5136 Other intervertebral disc degeneration, lumbar region: Secondary | ICD-10-CM | POA: Diagnosis not present

## 2021-10-01 DIAGNOSIS — M9904 Segmental and somatic dysfunction of sacral region: Secondary | ICD-10-CM | POA: Diagnosis not present

## 2021-10-01 DIAGNOSIS — M9903 Segmental and somatic dysfunction of lumbar region: Secondary | ICD-10-CM | POA: Diagnosis not present

## 2021-10-01 DIAGNOSIS — M9905 Segmental and somatic dysfunction of pelvic region: Secondary | ICD-10-CM | POA: Diagnosis not present

## 2021-10-12 DIAGNOSIS — M9903 Segmental and somatic dysfunction of lumbar region: Secondary | ICD-10-CM | POA: Diagnosis not present

## 2021-10-12 DIAGNOSIS — M9905 Segmental and somatic dysfunction of pelvic region: Secondary | ICD-10-CM | POA: Diagnosis not present

## 2021-10-12 DIAGNOSIS — M9904 Segmental and somatic dysfunction of sacral region: Secondary | ICD-10-CM | POA: Diagnosis not present

## 2021-10-12 DIAGNOSIS — M5136 Other intervertebral disc degeneration, lumbar region: Secondary | ICD-10-CM | POA: Diagnosis not present

## 2021-10-13 ENCOUNTER — Other Ambulatory Visit: Payer: Self-pay | Admitting: Orthopedic Surgery

## 2021-10-13 DIAGNOSIS — K219 Gastro-esophageal reflux disease without esophagitis: Secondary | ICD-10-CM

## 2021-10-22 DIAGNOSIS — M9903 Segmental and somatic dysfunction of lumbar region: Secondary | ICD-10-CM | POA: Diagnosis not present

## 2021-10-22 DIAGNOSIS — M9905 Segmental and somatic dysfunction of pelvic region: Secondary | ICD-10-CM | POA: Diagnosis not present

## 2021-10-22 DIAGNOSIS — M9904 Segmental and somatic dysfunction of sacral region: Secondary | ICD-10-CM | POA: Diagnosis not present

## 2021-10-22 DIAGNOSIS — M5136 Other intervertebral disc degeneration, lumbar region: Secondary | ICD-10-CM | POA: Diagnosis not present

## 2021-10-29 DIAGNOSIS — M9903 Segmental and somatic dysfunction of lumbar region: Secondary | ICD-10-CM | POA: Diagnosis not present

## 2021-10-29 DIAGNOSIS — M9905 Segmental and somatic dysfunction of pelvic region: Secondary | ICD-10-CM | POA: Diagnosis not present

## 2021-10-29 DIAGNOSIS — M9904 Segmental and somatic dysfunction of sacral region: Secondary | ICD-10-CM | POA: Diagnosis not present

## 2021-10-29 DIAGNOSIS — M5136 Other intervertebral disc degeneration, lumbar region: Secondary | ICD-10-CM | POA: Diagnosis not present

## 2021-11-06 ENCOUNTER — Encounter: Payer: Self-pay | Admitting: Obstetrics and Gynecology

## 2021-11-06 ENCOUNTER — Ambulatory Visit: Payer: Medicare Other | Admitting: Obstetrics and Gynecology

## 2021-11-06 VITALS — BP 160/84 | HR 99

## 2021-11-06 DIAGNOSIS — N3281 Overactive bladder: Secondary | ICD-10-CM

## 2021-11-06 NOTE — Progress Notes (Addendum)
[facility] Return Visit  SUBJECTIVE  History of Present Illness: Felicia Hall is a 76 y.o. female seen in follow-up for OAB.   Leakage with cough/sneeze has improved with pelvic floor exercises. She has stopped the [place] She has stopped it for months but now all of a sudden recently she has more frequency- yesterday went 19 times in 24 hours.   She is trying to cut back on tea and flavored water as well as chocolate but it does not help much.   Past Medical History: Patient  has a past medical history of Acute upper respiratory infections of unspecified site, Allergic rhinitis due to pollen, Arthritis, Benign essential hypertension, Bursitis, Cervical spondylosis without myelopathy, Cervicalgia, Controlled type 2 diabetes mellitus without complication, without long-term current use of insulin ([place]) (10/01/2019), Diaphragmatic hernia without mention of obstruction or gangrene, Disorder of bone and cartilage, unspecified, Diverticulosis of colon (without mention of hemorrhage), Encounter for long-term (current) use of other medications, GERD (gastroesophageal reflux disease), Glaucoma, High blood sugar, History of bone density study, History of colonoscopy, History of echocardiogram, History of hiatal hernia, History of Papanicolaou smear of cervix, Hyperlipidemia LDL goal < 100, Hypertension, Keratoconjunctivitis sicca, 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's, Other and unspecified hyperlipidemia, Pain in joint, site unspecified, Pre-diabetes, Routine general medical examination at a health care facility, Sinusitis, Sjogren's syndrome ([place]) (10/27/2012), Tear film insufficiency, unspecified, Unspecified disorder of kidney and ureter, and Unspecified glaucoma(365.9).   Past Surgical History: She  has a past surgical history that includes Cholecystectomy (1993); Cataract extraction w/ intraocular lens  implant, bilateral (Bilateral, 12/2014); Nasal septum surgery; Blepharoptosis repair; Dilation  and curettage of uterus; Lumbar laminectomy/decompression microdiscectomy (N/A, 04/21/2017); Gallbladder surgery; and Back surgery.   Medications: She has a current medication list which includes the following prescription(s): aspirin ec, brinzolamide, calcium citrate-vitamin d, chlorpheniramine, cholecalciferol, ezetimibe, losartan, pantoprazole, and rosuvastatin.   Allergies: Patient is allergic to statins, allopurinol, and darvocet [propoxyphene n-acetaminophen].   Social History: Patient  reports that she has never smoked. She has never used smokeless tobacco. She reports current alcohol use. She reports that she does not use drugs.      OBJECTIVE     Physical Exam: Vitals:   11/06/21 1041  BP: (!) 160/84  Pulse: 99    Gen: No apparent distress, A&O x 3.  Detailed Urogynecologic Evaluation:  Deferred.    ASSESSMENT AND PLAN    Felicia Hall is a 76 y.o. with:  1. Overactive bladder    - Discussed options of PTNS, sacral neuromodulation and intravesical botox. She is possibly interested in PTNS or SNM but wants to think about it some more.  - Discussed option of alternative medication. Has a relative contraindication to anticholinergics due to sjogrens and age. BP has been elevated so would not recommend Myrbetriq. - Wants to try the gemtesa again. She has some leftover at home and will use that first. She will notify if she wants a prescription or other treatment.   Jaquita Folds, MD  Time spent: I spent 20 minutes dedicated to the care of this patient on the date of this encounter to include pre-visit review of records, face-to-face time with the patient and post visit documentation.

## 2021-11-09 DIAGNOSIS — M9903 Segmental and somatic dysfunction of lumbar region: Secondary | ICD-10-CM | POA: Diagnosis not present

## 2021-11-09 DIAGNOSIS — M5134 Other intervertebral disc degeneration, thoracic region: Secondary | ICD-10-CM | POA: Diagnosis not present

## 2021-11-09 DIAGNOSIS — M9902 Segmental and somatic dysfunction of thoracic region: Secondary | ICD-10-CM | POA: Diagnosis not present

## 2021-11-09 DIAGNOSIS — M5136 Other intervertebral disc degeneration, lumbar region: Secondary | ICD-10-CM | POA: Diagnosis not present

## 2021-11-14 DIAGNOSIS — R42 Dizziness and giddiness: Secondary | ICD-10-CM | POA: Diagnosis not present

## 2021-11-14 DIAGNOSIS — R002 Palpitations: Secondary | ICD-10-CM | POA: Diagnosis not present

## 2021-11-14 DIAGNOSIS — R531 Weakness: Secondary | ICD-10-CM | POA: Diagnosis not present

## 2021-11-16 ENCOUNTER — Telehealth: Payer: Self-pay | Admitting: Cardiology

## 2021-11-16 NOTE — Progress Notes (Unsigned)
Office Visit    Patient Name: Felicia Hall Date of Encounter: 11/17/2021  Primary Care Provider:  Yvonna Alanis, NP Primary Cardiologist:  Donato Heinz, MD  Chief Complaint    76 year old female with a history of palpitations, SVT, PACs, PVCs, hypertension, hyperlipidemia, type 2 diabetes, and GERD who presents for follow-up related to palpitations.   Past Medical History    Past Medical History:  Diagnosis Date   Acute upper respiratory infections of unspecified site    Allergic rhinitis due to pollen    Arthritis    Benign essential hypertension    Bursitis    Cervical spondylosis without myelopathy    Cervicalgia    Controlled type 2 diabetes mellitus without complication, without long-term current use of insulin ([place]) 10/01/2019   Diaphragmatic hernia without mention of obstruction or gangrene    Disorder of bone and cartilage, unspecified    Diverticulosis of colon (without mention of hemorrhage)    Encounter for long-term (current) use of other medications    GERD (gastroesophageal reflux disease)    Glaucoma    High blood sugar    History of bone density study    History of colonoscopy    History of echocardiogram    History of hiatal hernia    History of Papanicolaou smear of cervix    Hyperlipidemia LDL goal < 100    Hypertension    Keratoconjunctivitis sicca, 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's    Other and unspecified hyperlipidemia    Pain in joint, site unspecified    Pre-diabetes    Routine general medical examination at a health care facility    Sinusitis    Sjogren's syndrome ([place]) 10/27/2012   Tear film insufficiency, unspecified    Unspecified disorder of kidney and ureter    Unspecified glaucoma(365.9)    Past Surgical History:  Procedure Laterality Date   BACK SURGERY     BELPHAROPTOSIS REPAIR     brow lift   bil   CATARACT EXTRACTION W/ INTRAOCULAR LENS  IMPLANT, BILATERAL Bilateral 12/2014   Dr. Prudencio Burly   CHOLECYSTECTOMY  1993    Dr.Lindsey    DILATION AND CURETTAGE OF UTERUS     GALLBLADDER SURGERY     LUMBAR LAMINECTOMY/DECOMPRESSION MICRODISCECTOMY N/A 04/21/2017   Procedure: BILATERAL LUMBAR FOUR- LUMBAR FIVE AND LEFT LUMBAR FIVE- SACRAL ONE LAMINOTOMY, MEDIAL FACETECTOMY AND FORAMINOTOMIES;  Surgeon: Jovita Gamma, MD;  Location: [facility];  Service: Neurosurgery;  Laterality: N/A;   NASAL SEPTUM SURGERY      Allergies  Allergies  Allergen Reactions   Statins Other (See Comments)    Muscle problems   Allopurinol Rash   Darvocet [Propoxyphene N-Acetaminophen] Rash    History of Present Illness    76 year old female with the above past medical history including palpitations, SVT, PACs, PVCs, hypertension, hyperlipidemia, type 2 diabetes, and GERD.  She was initially referred to cardiology for evaluation of palpitations in July 2021.  Cardiac monitor in August 2021 showed no significant arrhythmia.  Coronary calcium score in August 2021 was 463 (86th percentile).  Echocardiogram in October 2021 showed EF 70 to 75%, RV function, G1 DD, mild MR, severely elevated PASP was reported, however, pulmonary pressures appeared normal upon review per Dr. Gardiner Rhyme.  Lexiscan Myoview in October 2021 showed no evidence of ischemia, EF 76%.  She was last seen in the office on 04/15/2021 and reported a syncopal episode.  Repeat echocardiogram showed EF 68%, LV function, no significant valvular abnormalities.  Repeat Zio monitor in  05/2021 showed 6 episodes of SVT, no significant arrhythmias. She contacted our office on 11/16/2021 and reported episode that occurred 2 days prior during which she felt her heart was beating fast and weak, she was cold clammy and shaky.  She called 911, but by the time they arrived her symptoms resolved.  She denied any associated chest pain or shortness of breath.  She presents today for follow-up. Since her last visit has been stable from a cardiac standpoint.  She denies any further palpitations, denies  dizziness, presyncope, syncope.  She notes that when her symptoms started she noted some mid sternal chest discomfort.  She states "I thought I was having a heart attack."  She denies any additional symptoms concerning for angina. Overall, she reports feeling well however, she states that her symptoms were concerning and she has felt uneasy ever since her episode.  Home Medications    Current Outpatient Medications  Medication Sig Dispense Refill   aspirin EC 81 MG tablet Take 1 tablet (81 mg total) by mouth daily. Swallow whole. 30 tablet 11   brinzolamide (AZOPT) 1 % ophthalmic suspension Place 1 drop into both eyes 2 (two) times daily.     calcium citrate-vitamin D (CITRACAL+D) 315-200 MG-UNIT per tablet Take 1 tablet by mouth 4 (four) times daily.     chlorpheniramine (CHLOR-TRIMETON) 4 MG tablet Take 4 mg by mouth daily.     cholecalciferol (VITAMIN D) 1000 UNITS tablet Take 2,000 Units by mouth daily.      ezetimibe (ZETIA) 10 MG tablet TAKE 1 TABLET BY MOUTH EVERY DAY 90 tablet 1   losartan (COZAAR) 50 MG tablet TAKE 1 TABLET BY MOUTH EVERY DAY 90 tablet 3   metoprolol succinate (TOPROL XL) 25 MG 24 hr tablet Take 1 tablet (25 mg total) by mouth daily. 30 tablet 3   pantoprazole (PROTONIX) 40 MG tablet TAKE 1 TABLET BY MOUTH EVERY DAY 90 tablet 3   rosuvastatin (CRESTOR) 10 MG tablet Take 1 tablet (10 mg total) by mouth daily. 90 tablet 3   No current facility-administered medications for this visit.     Review of Systems    She denies chest pain, palpitations, dyspnea, pnd, orthopnea, n, v, dizziness, syncope, edema, weight gain, or early satiety. All other systems reviewed and are otherwise negative except as noted above.   Physical Exam    VS:  BP 122/80 (BP Location: Right Arm, Patient Position: Sitting, Cuff Size: Normal)   Pulse 79   Ht '5\' 6"'$  (1.676 m)   Wt 161 lb 6.4 oz (73.2 kg)   SpO2 97%   BMI 26.05 kg/m   GEN: Well nourished, well developed, in no acute  distress. HEENT: normal. Neck: Supple, no JVD, carotid bruits, or masses. Cardiac: RRR, no murmurs, rubs, or gallops. No clubbing, cyanosis, edema.  Radials/DP/PT 2+ and equal bilaterally.  Respiratory:  Respirations regular and unlabored, clear to auscultation bilaterally. GI: Soft, nontender, nondistended, BS + x 4. MS: no deformity or atrophy. Skin: warm and dry, no rash. Neuro:  Strength and sensation are intact. Psych: Normal affect.  Accessory Clinical Findings    ECG personally reviewed by me today -Sinus rhythm, 79 bpm, first-degree AV block- no acute changes.  Lab Results  Component Value Date   WBC 7.3 06/22/2021   HGB 15.4 06/22/2021   HCT 47.6 (H) 06/22/2021   MCV 92.6 06/22/2021   PLT 251 06/22/2021   Lab Results  Component Value Date   CREATININE 0.75 06/22/2021   BUN  10 06/22/2021   NA 138 06/22/2021   K 4.5 06/22/2021   CL 102 06/22/2021   CO2 28 06/22/2021   Lab Results  Component Value Date   ALT 16 06/22/2021   AST 17 06/22/2021   ALKPHOS 48 05/23/2016   BILITOT 1.0 06/22/2021   Lab Results  Component Value Date   CHOL 119 06/22/2021   HDL 43 (L) 06/22/2021   LDLCALC 54 06/22/2021   TRIG 137 06/22/2021   CHOLHDL 2.8 06/22/2021    Lab Results  Component Value Date   HGBA1C 6.1 (H) 06/22/2021    Assessment & Plan    1. Palpitations/SVT: Cardiac monitor in August 2021 showed no significant arrhythmia. Repeat Zio monitor showed 6 episodes of SVT, no significant arrhythmias.  Echo was stable in January 2023.  Reports recent episode where she felt her heart beating fast and weak, she was cold clammy and shaky.  Denies dizziness, presyncope, syncope.  She did note some midsternal chest discomfort at onset of symptoms.  Denies dyspnea.  She has not had any recurrent episodes of palpitations.  She declines repeat monitor.  Will check BMET, Mg.  Discussed with Dr. Gardiner Rhyme, primary cardiologist, will start Toprol 25 mg daily.  Advised her to continue to  monitor BP/HR. Discussed vagal maneuvers, ED precautions.  2. CAD:  Coronary calcium score in August 2021 was 463 (86th percentile).  Echocardiogram in October 2021 showed EF 70 to 75%, RV function, G1 DD, mild MR, severely elevated PASP was reported, however, pulmonary pressures appeared normal upon review per Dr. Gardiner Rhyme.  Lexiscan Myoview in October 2021 showed no evidence of ischemia, EF 76%.  She did note some midsternal chest discomfort at onset of her symptoms.  She states she was concerned she was "having a heart attack." Through shared decision-making, given elevated calcium score, as she is unable to complete exercise Myoview, will pursue cardiac PET stress test for further ischemic evaluation.  Discussed ED precautions.  Shared Decision Making/Informed Consent The risks [chest pain, shortness of breath, cardiac arrhythmias, dizziness, blood pressure fluctuations, myocardial infarction, stroke/transient ischemic attack, nausea, vomiting, allergic reaction, radiation exposure, metallic taste sensation and life-threatening complications (estimated to be 1 in 10,000)], benefits (risk stratification, diagnosing coronary artery disease, treatment guidance) and alternatives of a cardiac PET stress test were discussed in detail with Ms. Draheim and she agrees to proceed.  3. Hypertension: BP well controlled. Continue current antihypertensive regimen.   4. Hyperlipidemia: LDL was 54 in 06/2021. Monitored and managed per PCP.   5. Type 2 diabetes: A1C was 6.1 in 06/2021. Monitored and managed per PCP.   6. Disposition: Follow-up in 3-4 months.   Lenna Sciara, NP 11/17/2021, 9:54 AM

## 2021-11-16 NOTE — Telephone Encounter (Signed)
Patient c/o Palpitations:  High priority if patient c/o lightheadedness, shortness of breath, or chest pain  How long have you had palpitations/irregular HR/ Afib? Are you having the symptoms now? Episode occurred Saturday, not having symptoms now  Are you currently experiencing lightheadedness, SOB or CP? No   Do you have a history of afib (atrial fibrillation) or irregular heart rhythm? Yes, episodes of heart beating to fast  Have you checked your BP or HR? (document readings if available):  11/16/21 117/74 HR in 90's 11/14/21 126/74 HR 73  Are you experiencing any other symptoms? cold sweat, clammy, and weak when episode occurred   Paramedics came out and checked patient day of occurrence. Episode had passed when they arrived so she was not taken to the hospital.

## 2021-11-16 NOTE — Telephone Encounter (Addendum)
Patient reports that early Saturday morning, after eating breakfast, her heart was fast and weak. She was cold and clammy and shaky. She called 911. By the time they got there, she felt better and didn't go to the ED. (said EMTs told her it wasn't necessary). She denied sob or pain during this 15 minute episode. Pt has appointment with E. Monge tomorrow and will come. Recommended that if she has another episode, to go to the ED. She verbalized understanding.

## 2021-11-17 ENCOUNTER — Ambulatory Visit: Payer: Medicare Other | Admitting: Nurse Practitioner

## 2021-11-17 ENCOUNTER — Encounter: Payer: Self-pay | Admitting: Nurse Practitioner

## 2021-11-17 VITALS — BP 122/80 | HR 79 | Ht 66.0 in | Wt 161.4 lb

## 2021-11-17 DIAGNOSIS — R002 Palpitations: Secondary | ICD-10-CM | POA: Diagnosis not present

## 2021-11-17 DIAGNOSIS — I2510 Atherosclerotic heart disease of native coronary artery without angina pectoris: Secondary | ICD-10-CM | POA: Diagnosis not present

## 2021-11-17 DIAGNOSIS — E785 Hyperlipidemia, unspecified: Secondary | ICD-10-CM

## 2021-11-17 DIAGNOSIS — E119 Type 2 diabetes mellitus without complications: Secondary | ICD-10-CM

## 2021-11-17 DIAGNOSIS — I471 Supraventricular tachycardia: Secondary | ICD-10-CM

## 2021-11-17 DIAGNOSIS — I10 Essential (primary) hypertension: Secondary | ICD-10-CM | POA: Diagnosis not present

## 2021-11-17 LAB — BASIC METABOLIC PANEL
BUN/Creatinine Ratio: 16 (ref 12–28)
BUN: 12 mg/dL (ref 8–27)
CO2: 25 mmol/L (ref 20–29)
Calcium: 9.9 mg/dL (ref 8.7–10.3)
Chloride: 97 mmol/L (ref 96–106)
Creatinine, Ser: 0.74 mg/dL (ref 0.57–1.00)
Glucose: 96 mg/dL (ref 70–99)
Potassium: 4.6 mmol/L (ref 3.5–5.2)
Sodium: 135 mmol/L (ref 134–144)
eGFR: 84 mL/min/{1.73_m2} (ref >59)

## 2021-11-17 LAB — MAGNESIUM: Magnesium: 2.1 mg/dL (ref 1.6–2.3)

## 2021-11-17 MED ORDER — METOPROLOL SUCCINATE ER 25 MG PO TB24: 25.0000 mg | ORAL_TABLET | Freq: Every day | ORAL | 3 refills | Status: DC

## 2021-11-17 NOTE — Patient Instructions (Signed)
Medication Instructions:  Start Toprol XL 25 mg daily  *If you need a refill on your cardiac medications before your next appointment, please call your pharmacy*   Lab Work: Your physician recommends that you complete labs today BMET, Magnesium  If you have labs (blood work) drawn today and your tests are completely normal, you will receive your results only by: [place] (if you have MyChart) OR A paper copy in the mail If you have any lab test that is abnormal or we need to change your treatment, we will call you to review the results.   Testing/Procedures: CARDIAC PET- Your physician has requested that you have a Cardiac Pet Stress Test. This testing is completed at [facility] ([place], [place] [address]). The schedulers will call you to get this scheduled. Please follow instructions below and call the office with any questions/concerns [phone]).    Follow-Up: At [facility], you and your health needs are our priority.  As part of our continuing mission to provide you with exceptional heart care, we have created designated Provider Care Teams.  These Care Teams include your primary Cardiologist (physician) and Advanced Practice Providers (APPs -  Physician Assistants and Nurse Practitioners) who all work together to provide you with the care you need, when you need it.  We recommend signing up for the patient portal called "MyChart".  Sign up information is provided on this After Visit Summary.  MyChart is used to connect with patients for Virtual Visits (Telemedicine).  Patients are able to view lab/test results, encounter notes, upcoming appointments, etc.  Non-urgent messages can be sent to your provider as well.   To learn more about what you can do with MyChart, go to [email or web address].    Your next appointment:   3-4 month(s)  The format for your next appointment:   In Person  Provider:   Donato Heinz, MD   or Diona Browner, NP        Other Instructions   Important Information About Sugar

## 2021-11-18 ENCOUNTER — Encounter: Payer: Self-pay | Admitting: Obstetrics and Gynecology

## 2021-11-18 ENCOUNTER — Ambulatory Visit: Payer: Medicare Other | Admitting: Obstetrics and Gynecology

## 2021-11-18 VITALS — BP 127/81 | HR 70

## 2021-11-18 DIAGNOSIS — N3281 Overactive bladder: Secondary | ICD-10-CM

## 2021-11-18 NOTE — Progress Notes (Signed)
[facility]  PTNS VISIT  CC:  Overactive bladder  76 y.o. with refractory overactive bladder who presents for percutaneous tibial nerve stimulation. The patient presents for PTNS session # 1.   Procedure: The patient was placed in the sitting position and the right lower extremity was prepped in the usual fashion. The PTNS needle was then inserted at a 60 degree angle, 5 cm cephalad and 2 cm posterior to the medial malleolus. The PTNS unit was then programmed and an optimal response was noted at 4 milliamps. The PTNS stimulation was then performed at this setting for 30 minutes without incident and the patient tolerated the procedure well. The needle was removed and hemostasis was noted.   The pt will return in 1 week for PTNS session # 2. Will give her bladder diary to fill out for baseline symptoms.    Jaquita Folds, MD

## 2021-11-23 DIAGNOSIS — M9902 Segmental and somatic dysfunction of thoracic region: Secondary | ICD-10-CM | POA: Diagnosis not present

## 2021-11-23 DIAGNOSIS — M9903 Segmental and somatic dysfunction of lumbar region: Secondary | ICD-10-CM | POA: Diagnosis not present

## 2021-11-23 DIAGNOSIS — M5136 Other intervertebral disc degeneration, lumbar region: Secondary | ICD-10-CM | POA: Diagnosis not present

## 2021-11-23 DIAGNOSIS — M5134 Other intervertebral disc degeneration, thoracic region: Secondary | ICD-10-CM | POA: Diagnosis not present

## 2021-11-27 ENCOUNTER — Encounter: Payer: Self-pay | Admitting: Obstetrics and Gynecology

## 2021-11-27 ENCOUNTER — Ambulatory Visit: Payer: Medicare Other | Admitting: Obstetrics and Gynecology

## 2021-11-27 VITALS — BP 150/73 | HR 73

## 2021-11-27 DIAGNOSIS — N3281 Overactive bladder: Secondary | ICD-10-CM

## 2021-11-27 NOTE — Progress Notes (Signed)
[facility]  PTNS VISIT  CC:  Overactive bladder  76 y.o. with refractory overactive bladder who presents for percutaneous tibial nerve stimulation. The patient presents for PTNS session # 2.  Baseline bladder diary brought today. Voiding 12+ times per day. Scanned into media.    Procedure: The patient was placed in the sitting position and the left lower extremity was prepped in the usual fashion. The PTNS needle was then inserted at a 60 degree angle, 5 cm cephalad and 2 cm posterior to the medial malleolus. The PTNS unit was then programmed and an optimal response was noted at 6 milliamps. The PTNS stimulation was then performed at this setting for 30 minutes without incident and the patient tolerated the procedure well. The needle was removed and hemostasis was noted.   The pt will return in 1 week for PTNS session # 3.    Jaquita Folds, MD

## 2021-12-02 ENCOUNTER — Ambulatory Visit: Payer: Medicare Other | Admitting: Obstetrics and Gynecology

## 2021-12-02 ENCOUNTER — Encounter: Payer: Self-pay | Admitting: Obstetrics and Gynecology

## 2021-12-02 VITALS — BP 139/76 | HR 67

## 2021-12-02 DIAGNOSIS — N3281 Overactive bladder: Secondary | ICD-10-CM

## 2021-12-02 NOTE — Progress Notes (Signed)
[facility]  PTNS VISIT  CC:  Overactive bladder  76 y.o. with refractory overactive bladder who presents for percutaneous tibial nerve stimulation. The patient presents for PTNS session # 3.   Procedure: The patient was placed in the sitting position and the right lower extremity was prepped in the usual fashion. The PTNS needle was then inserted at a 60 degree angle, 5 cm cephalad and 2 cm posterior to the medial malleolus. The PTNS unit was then programmed and an optimal response was noted at 5 milliamps. The PTNS stimulation was then performed at this setting for 30 minutes without incident and the patient tolerated the procedure well. The needle was removed and hemostasis was noted.   The pt will return in 1 week for PTNS session # 4.    Jaquita Folds, MD

## 2021-12-03 DIAGNOSIS — M5134 Other intervertebral disc degeneration, thoracic region: Secondary | ICD-10-CM | POA: Diagnosis not present

## 2021-12-03 DIAGNOSIS — M9903 Segmental and somatic dysfunction of lumbar region: Secondary | ICD-10-CM | POA: Diagnosis not present

## 2021-12-03 DIAGNOSIS — M5136 Other intervertebral disc degeneration, lumbar region: Secondary | ICD-10-CM | POA: Diagnosis not present

## 2021-12-03 DIAGNOSIS — M9902 Segmental and somatic dysfunction of thoracic region: Secondary | ICD-10-CM | POA: Diagnosis not present

## 2021-12-09 ENCOUNTER — Encounter: Payer: Self-pay | Admitting: Obstetrics and Gynecology

## 2021-12-09 ENCOUNTER — Ambulatory Visit: Payer: Medicare Other | Admitting: Obstetrics and Gynecology

## 2021-12-09 VITALS — BP 138/76 | HR 82

## 2021-12-09 DIAGNOSIS — N3281 Overactive bladder: Secondary | ICD-10-CM | POA: Diagnosis not present

## 2021-12-09 NOTE — Progress Notes (Signed)
[facility]  PTNS VISIT  CC:  Overactive bladder  76 y.o. with refractory overactive bladder who presents for percutaneous tibial nerve stimulation. The patient presents for PTNS session # 4.   Procedure: The patient was placed in the sitting position and the left lower extremity was prepped in the usual fashion. The PTNS needle was then inserted at a 60 degree angle, 5 cm cephalad and 2 cm posterior to the medial malleolus. The PTNS unit was then programmed and an optimal response was noted at 5 milliamps. The PTNS stimulation was then performed at this setting for 30 minutes without incident and the patient tolerated the procedure well. The needle was removed and hemostasis was noted.   The pt will return in 1 week for PTNS session # 5.    Jaquita Folds, MD

## 2021-12-15 DIAGNOSIS — M5134 Other intervertebral disc degeneration, thoracic region: Secondary | ICD-10-CM | POA: Diagnosis not present

## 2021-12-15 DIAGNOSIS — M9903 Segmental and somatic dysfunction of lumbar region: Secondary | ICD-10-CM | POA: Diagnosis not present

## 2021-12-15 DIAGNOSIS — M9902 Segmental and somatic dysfunction of thoracic region: Secondary | ICD-10-CM | POA: Diagnosis not present

## 2021-12-15 DIAGNOSIS — M5136 Other intervertebral disc degeneration, lumbar region: Secondary | ICD-10-CM | POA: Diagnosis not present

## 2021-12-21 ENCOUNTER — Ambulatory Visit (INDEPENDENT_AMBULATORY_CARE_PROVIDER_SITE_OTHER): Payer: Medicare Other | Admitting: Obstetrics and Gynecology

## 2021-12-21 ENCOUNTER — Encounter: Payer: Self-pay | Admitting: Obstetrics and Gynecology

## 2021-12-21 VITALS — BP 125/76 | HR 72

## 2021-12-21 DIAGNOSIS — N3281 Overactive bladder: Secondary | ICD-10-CM

## 2021-12-21 NOTE — Progress Notes (Signed)
[facility]  PTNS VISIT  CC:  Overactive bladder  76 y.o. with refractory overactive bladder who presents for percutaneous tibial nerve stimulation. The patient presents for PTNS session # 5.   Procedure: The patient was placed in the sitting position and the right lower extremity was prepped in the usual fashion. The PTNS needle was then inserted at a 60 degree angle, 5 cm cephalad and 2 cm posterior to the medial malleolus. The PTNS unit was then programmed and an optimal response was noted at 5 milliamps. The PTNS stimulation was then performed at this setting for 30 minutes without incident and the patient tolerated the procedure well. The needle was removed and hemostasis was noted.   The pt will return in 1 week for PTNS session # 6.    Jaquita Folds, MD

## 2021-12-24 DIAGNOSIS — M9903 Segmental and somatic dysfunction of lumbar region: Secondary | ICD-10-CM | POA: Diagnosis not present

## 2021-12-24 DIAGNOSIS — M9902 Segmental and somatic dysfunction of thoracic region: Secondary | ICD-10-CM | POA: Diagnosis not present

## 2021-12-24 DIAGNOSIS — M5136 Other intervertebral disc degeneration, lumbar region: Secondary | ICD-10-CM | POA: Diagnosis not present

## 2021-12-24 DIAGNOSIS — M5134 Other intervertebral disc degeneration, thoracic region: Secondary | ICD-10-CM | POA: Diagnosis not present

## 2021-12-28 ENCOUNTER — Other Ambulatory Visit: Payer: Medicare Other

## 2021-12-28 DIAGNOSIS — I10 Essential (primary) hypertension: Secondary | ICD-10-CM

## 2021-12-28 DIAGNOSIS — E119 Type 2 diabetes mellitus without complications: Secondary | ICD-10-CM | POA: Diagnosis not present

## 2021-12-28 DIAGNOSIS — E782 Mixed hyperlipidemia: Secondary | ICD-10-CM | POA: Diagnosis not present

## 2021-12-29 ENCOUNTER — Other Ambulatory Visit: Payer: Self-pay | Admitting: Cardiology

## 2021-12-29 ENCOUNTER — Encounter: Payer: Self-pay | Admitting: Obstetrics and Gynecology

## 2021-12-29 ENCOUNTER — Ambulatory Visit: Payer: Medicare Other | Admitting: Obstetrics and Gynecology

## 2021-12-29 VITALS — BP 130/74 | HR 71

## 2021-12-29 DIAGNOSIS — N3281 Overactive bladder: Secondary | ICD-10-CM | POA: Diagnosis not present

## 2021-12-29 LAB — CBC WITH DIFFERENTIAL/PLATELET
Absolute Monocytes: 671 cells/uL (ref 200–950)
Basophils Absolute: 109 cells/uL (ref 0–200)
Basophils Relative: 1.4 %
Eosinophils Absolute: 328 cells/uL (ref 15–500)
Eosinophils Relative: 4.2 %
HCT: 43.7 % (ref 35.0–45.0)
Hemoglobin: 14.8 g/dL (ref 11.7–15.5)
Lymphs Abs: 1966 cells/uL (ref 850–3900)
MCH: 30.3 pg (ref 27.0–33.0)
MCHC: 33.9 g/dL (ref 32.0–36.0)
MCV: 89.4 fL (ref 80.0–100.0)
MPV: 10.6 fL (ref 7.5–12.5)
Monocytes Relative: 8.6 %
Neutro Abs: 4727 cells/uL (ref 1500–7800)
Neutrophils Relative %: 60.6 %
Platelets: 225 10*3/uL (ref 140–400)
RBC: 4.89 10*6/uL (ref 3.80–5.10)
RDW: 11.9 % (ref 11.0–15.0)
Total Lymphocyte: 25.2 %
WBC: 7.8 10*3/uL (ref 3.8–10.8)

## 2021-12-29 LAB — LIPID PANEL
Cholesterol: 122 mg/dL
HDL: 45 mg/dL — ABNORMAL LOW
LDL Cholesterol (Calc): 56 mg/dL
Non-HDL Cholesterol (Calc): 77 mg/dL
Total CHOL/HDL Ratio: 2.7 (calc)
Triglycerides: 131 mg/dL

## 2021-12-29 LAB — COMPREHENSIVE METABOLIC PANEL
AG Ratio: 1.7 (calc) (ref 1.0–2.5)
ALT: 14 U/L (ref 6–29)
AST: 16 U/L (ref 10–35)
Albumin: 4.2 g/dL (ref 3.6–5.1)
Alkaline phosphatase (APISO): 49 U/L (ref 37–153)
BUN: 13 mg/dL (ref 7–25)
CO2: 27 mmol/L (ref 20–32)
Calcium: 9.7 mg/dL (ref 8.6–10.4)
Chloride: 100 mmol/L (ref 98–110)
Creat: 0.87 mg/dL (ref 0.60–1.00)
Globulin: 2.5 g/dL (calc) (ref 1.9–3.7)
Glucose, Bld: 100 mg/dL — ABNORMAL HIGH (ref 65–99)
Potassium: 4.5 mmol/L (ref 3.5–5.3)
Sodium: 135 mmol/L (ref 135–146)
Total Bilirubin: 0.9 mg/dL (ref 0.2–1.2)
Total Protein: 6.7 g/dL (ref 6.1–8.1)

## 2021-12-29 LAB — HEMOGLOBIN A1C
Hgb A1c MFr Bld: 5.9 %{Hb} — ABNORMAL HIGH
Mean Plasma Glucose: 123 mg/dL
eAG (mmol/L): 6.8 mmol/L

## 2021-12-29 MED ORDER — TROSPIUM CHLORIDE 20 MG PO TABS: 20.0000 mg | ORAL_TABLET | Freq: Two times a day (BID) | ORAL | 5 refills | Status: DC

## 2021-12-29 NOTE — Progress Notes (Addendum)
[facility] Return Visit  SUBJECTIVE  History of Present Illness: Felicia Hall is a 76 y.o. female seen in follow-up for overactive bladder. She has been undergoing PTNS treatments and completed 5 sessions. This last week, her symptoms seemed to have worsened and she is still urinating very frequently. She does not want to continue with the PTNS treatment.   Previously took [place] Recently started on a beta blocker (metoprolol).   Past Medical History: Patient  has a past medical history of Acute upper respiratory infections of unspecified site, Allergic rhinitis due to pollen, Arthritis, Benign essential hypertension, Bursitis, Cervical spondylosis without myelopathy, Cervicalgia, Controlled type 2 diabetes mellitus without complication, without long-term current use of insulin ([place]) (10/01/2019), Diaphragmatic hernia without mention of obstruction or gangrene, Disorder of bone and cartilage, unspecified, Diverticulosis of colon (without mention of hemorrhage), Encounter for long-term (current) use of other medications, GERD (gastroesophageal reflux disease), Glaucoma, High blood sugar, History of bone density study, History of colonoscopy, History of echocardiogram, History of hiatal hernia, History of Papanicolaou smear of cervix, Hyperlipidemia LDL goal < 100, Hypertension, Keratoconjunctivitis sicca, 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   Past Surgical History: She  has a past surgical history that includes Cholecystectomy (1993); Cataract extraction w/ intraocular lens  implant, bilateral (Bilateral, 12/2014); Nasal septum surgery; Blepharoptosis repair; Dilation and curettage of  uterus; Lumbar laminectomy/decompression microdiscectomy (N/A, 04/21/2017); Gallbladder surgery; and Back surgery.   Medications: She has a current medication list which includes the following prescription(s): trospium, aspirin ec, brinzolamide, calcium citrate-vitamin d, chlorpheniramine, cholecalciferol, ezetimibe, losartan, metoprolol succinate, pantoprazole, and rosuvastatin.   Allergies: Patient is allergic to statins, allopurinol, and darvocet [propoxyphene n-acetaminophen].   Social History: Patient  reports that she has never smoked. She has never used smokeless tobacco. She reports current alcohol use. She reports that she does not use drugs.      OBJECTIVE     Physical Exam: Vitals:   12/29/21 1042  BP: 130/74  Pulse: 71   Gen: No apparent distress, A&O x 3.  Detailed Urogynecologic Evaluation:  Deferred.    ASSESSMENT AND PLAN    Felicia Hall is a 76 y.o. with:  1. Overactive bladder    - Reviewed options of medication, intravesical botox and SNM. - She wants to try a different medication. Prescribed trospium '20mg'$  BID. Addendum: patient confirmed that she has open angle glaucoma so confirmed she is still able to take the medication.   Return 2 months for follow up  Jaquita Folds, MD

## 2021-12-31 ENCOUNTER — Ambulatory Visit (INDEPENDENT_AMBULATORY_CARE_PROVIDER_SITE_OTHER): Payer: Medicare Other | Admitting: Orthopedic Surgery

## 2021-12-31 ENCOUNTER — Encounter: Payer: Self-pay | Admitting: Orthopedic Surgery

## 2021-12-31 VITALS — BP 122/86 | HR 77 | Temp 96.8°F | Ht 66.0 in | Wt 162.4 lb

## 2021-12-31 DIAGNOSIS — R350 Frequency of micturition: Secondary | ICD-10-CM

## 2021-12-31 DIAGNOSIS — I2510 Atherosclerotic heart disease of native coronary artery without angina pectoris: Secondary | ICD-10-CM

## 2021-12-31 DIAGNOSIS — E782 Mixed hyperlipidemia: Secondary | ICD-10-CM

## 2021-12-31 DIAGNOSIS — E119 Type 2 diabetes mellitus without complications: Secondary | ICD-10-CM

## 2021-12-31 DIAGNOSIS — I10 Essential (primary) hypertension: Secondary | ICD-10-CM | POA: Diagnosis not present

## 2021-12-31 NOTE — Progress Notes (Signed)
Careteam: Patient Care Team: Yvonna Alanis, NP as PCP - General (Adult Health Nurse Practitioner) Donato Heinz, MD as PCP - Cardiology (Cardiology) Katy Apo, MD as Consulting Physician (Ophthalmology)  Seen by: Windell Moulding, AGNP-C  PLACE OF SERVICE:  [facility]  Advanced Directive information    Allergies  Allergen Reactions   Statins Other (See Comments)    Muscle problems   Allopurinol Rash   Darvocet [Propoxyphene N-Acetaminophen] Rash    Chief Complaint  Patient presents with   Medical Management of Chronic Issues    Patient presents today for a 6 month follow-up   Quality Metric Gaps    Eye & foot exam, COVID#6     HPI: Patient is a 76 y.o. female seen today for medical management of chronic conditions.   Lab work discussed with patient.   T2DM- A1c 5.9> was 6.1, eye exam 08/2021, foot exam normal today, diet controlled, on asa and Zetia/Crestor  HTN- bp controlled, BUN/creat 13/0.87, remains on losartan and metoprolol  HLD- Total 122, LDL 56, remains on Zetia/Crestor  CAD- followed by cardiology- continues to have episodes on rapid heart rate, 08/08 EKG sinus rhythm with 1st degree heart block, nuclear stress test scheduled next month  OAB- followed by Dr. Wannetta Sender, unsuccessful neuro stimulation treatments for OAB- she stopped treatments and now prescribed trospium- plans to start soon   Review of Systems:  Review of Systems  Constitutional:  Negative for chills, fever, malaise/fatigue and weight loss.  HENT:  Negative for hearing loss and sore throat.   Eyes:  Negative for redness.  Respiratory:  Negative for cough, shortness of breath and wheezing.   Cardiovascular:  Positive for palpitations. Negative for chest pain and leg swelling.  Gastrointestinal:  Negative for abdominal pain, blood in stool, diarrhea, heartburn, nausea and vomiting.  Genitourinary:  Negative for dysuria and frequency.  Musculoskeletal:  Positive for joint pain.  Negative for falls.  Skin:  Negative for rash.  Neurological:  Negative for dizziness, weakness and headaches.  Psychiatric/Behavioral:  Negative for depression and memory loss. The patient is not nervous/anxious.     Past Medical History:  Diagnosis Date   Acute upper respiratory infections of unspecified site    Allergic rhinitis due to pollen    Arthritis    Benign essential hypertension    Bursitis    Cervical spondylosis without myelopathy    Cervicalgia    Controlled type 2 diabetes mellitus without complication, without long-term current use of insulin ([place]) 10/01/2019   Diaphragmatic hernia without mention of obstruction or gangrene    Disorder of bone and cartilage, unspecified    Diverticulosis of colon (without mention of hemorrhage)    Encounter for long-term (current) use of other medications    GERD (gastroesophageal reflux disease)    Glaucoma    High blood sugar    History of bone density study    History of colonoscopy    History of echocardiogram    History of hiatal hernia    History of Papanicolaou smear of cervix    Hyperlipidemia LDL goal < 100    Hypertension    Keratoconjunctivitis sicca, 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 Prudencio Burly   CHOLECYSTECTOMY  1993   Dr.Lindsey    DILATION AND CURETTAGE OF UTERUS     GALLBLADDER SURGERY     LUMBAR LAMINECTOMY/DECOMPRESSION MICRODISCECTOMY N/A 04/21/2017   Procedure: BILATERAL LUMBAR FOUR- LUMBAR  FIVE AND LEFT LUMBAR FIVE- SACRAL ONE LAMINOTOMY, MEDIAL FACETECTOMY AND FORAMINOTOMIES;  Surgeon: Jovita Gamma, MD;  Location: [facility];  Service: Neurosurgery;  Laterality: N/A;   NASAL SEPTUM SURGERY     Social History:   reports that she has never smoked. She has never used smokeless tobacco. She reports current alcohol use. She reports that she does not use drugs.  Family History  Problem Relation Age of Onset   Heart attack Mother    Breast cancer Mother    Heart failure Mother    Pancreatitis Father    Diabetes Brother    Heart disease Brother        By-pass surgery   Lupus Cousin     Medications: Patient's Medications  New Prescriptions   No medications on file  Previous Medications   ASPIRIN EC 81 MG TABLET    Take 1 tablet (81 mg total) by mouth daily. Swallow whole.   BRINZOLAMIDE (AZOPT) 1 % OPHTHALMIC SUSPENSION    Place 1 drop into both eyes 2 (two) times daily.   CALCIUM CITRATE-VITAMIN D (CITRACAL+D) 315-200 MG-UNIT PER TABLET    Take 1 tablet by mouth 4 (four) times daily.   CHLORPHENIRAMINE (CHLOR-TRIMETON) 4 MG TABLET    Take 4 mg by mouth daily.   CHOLECALCIFEROL (VITAMIN D) 1000 UNITS TABLET    Take 2,000 Units by mouth daily.    EZETIMIBE (ZETIA) 10 MG TABLET    TAKE 1 TABLET BY MOUTH EVERY DAY   LOSARTAN (COZAAR) 50 MG TABLET    TAKE 1 TABLET BY MOUTH EVERY DAY   METOPROLOL SUCCINATE (TOPROL XL) 25 MG 24 HR TABLET    Take 1 tablet (25 mg total) by mouth daily.   PANTOPRAZOLE (PROTONIX) 40 MG TABLET    TAKE 1 TABLET BY MOUTH EVERY DAY   ROSUVASTATIN (CRESTOR) 10 MG TABLET    Take 1 tablet (10 mg total) by mouth daily.   TROSPIUM (SANCTURA) 20 MG TABLET    Take 1 tablet (20 mg total) by mouth 2 (two) times daily.  Modified Medications   No medications on file  Discontinued Medications   No medications on file    Physical Exam:  Vitals:   12/31/21 0954  BP: 122/86  Pulse: 77  Temp: (!) 96.8 F (36 C)  SpO2: 97%  Weight: 162 lb 6.4 oz (73.7 kg)   Height: '5\' 6"'$  (1.676 m)   Body mass index is 26.21 kg/m. Wt Readings from Last 3 Encounters:  12/31/21 162 lb 6.4 oz (73.7 kg)  11/17/21 161 lb 6.4 oz (73.2 kg)  09/17/21 161 lb 6.4 oz (73.2 kg)    Physical Exam Constitutional:      General: She is not in acute distress. HENT:     Head: Normocephalic.  Eyes:     General:        Right eye: No discharge.        Left eye: No discharge.  Cardiovascular:  Rate and Rhythm: Normal rate and regular rhythm.     Pulses:          Dorsalis pedis pulses are 1+ on the right side and 1+ on the left side.       Posterior tibial pulses are 1+ on the right side and 1+ on the left side.     Heart sounds: Normal heart sounds.  Pulmonary:     Effort: Pulmonary effort is normal. No respiratory distress.     Breath sounds: Normal breath sounds. No wheezing.  Abdominal:     General: Bowel sounds are normal. There is no distension.     Palpations: Abdomen is soft.     Tenderness: There is no abdominal tenderness.  Musculoskeletal:     Cervical back: Neck supple.     Right lower leg: No edema.     Left lower leg: No edema.  Feet:     Right foot:     Protective Sensation: 10 sites tested.  10 sites sensed.     Skin integrity: Skin integrity normal.     Toenail Condition: Right toenails are normal.     Left foot:     Protective Sensation: 10 sites tested.  10 sites sensed.     Skin integrity: Skin integrity normal.     Toenail Condition: Left toenails are normal.  Skin:    General: Skin is warm and dry.     Capillary Refill: Capillary refill takes less than 2 seconds.  Neurological:     General: No focal deficit present.     Mental Status: She is alert and oriented to person, place, and time.  Psychiatric:        Mood and Affect: Mood normal.        Behavior: Behavior normal.     Labs reviewed: Basic Metabolic Panel: Recent Labs    06/22/21 0807 11/17/21 0853 12/28/21 0814  NA 138 135 135  K 4.5 4.6 4.5  CL 102 97 100  CO2  '28 25 27  '$ GLUCOSE 116* 96 100*  BUN '10 12 13  '$ CREATININE 0.75 0.74 0.87  CALCIUM 10.0 9.9 9.7  MG  --  2.1  --   TSH 3.88  --   --    Liver Function Tests: Recent Labs    06/22/21 0807 12/28/21 0814  AST 17 16  ALT 16 14  BILITOT 1.0 0.9  PROT 7.0 6.7   No results for input(s): "LIPASE", "AMYLASE" in the last 8760 hours. No results for input(s): "AMMONIA" in the last 8760 hours. CBC: Recent Labs    06/22/21 0807 12/28/21 0814  WBC 7.3 7.8  NEUTROABS 4,263 4,727  HGB 15.4 14.8  HCT 47.6* 43.7  MCV 92.6 89.4  PLT 251 225   Lipid Panel: Recent Labs    06/22/21 0807 12/28/21 0814  CHOL 119 122  HDL 43* 45*  LDLCALC 54 56  TRIG 137 131  CHOLHDL 2.8 2.7   TSH: Recent Labs    06/22/21 0807  TSH 3.88   A1C: Lab Results  Component Value Date   HGBA1C 5.9 (H) 12/28/2021     Assessment/Plan 1. Controlled type 2 diabetes mellitus without complication, without long-term current use of insulin ([facility]) - A1c 5.9, was 6.1 - diet controlled - cont asa, ARB, and statin - eye exam 08/2021 - urine microalbumin- future  2. Benign essential hypertension - controlled - cont losartan and metoprolol  3. Mixed hyperlipidemia - LDL stable - cont Zetia/Crestor  4. Coronary  artery disease involving native coronary artery of native heart without angina pectoris - followed by cardiology - cont asa and statin - 08/08 EKG sinus rhythm with 1st degree heart block - nuclear stress test 10/24  5. Urine frequency - followed by Dr. Wannetta Sender - unsuccessful nerve stimulation treatments - started on trospium  Total time: 31 minutes. Greater than 50% of total time spent doing patient education regarding T2DM, HTN, HLD, rapid HR, and urinary frequency.     Next appt: Visit date not found  [place] [phone]

## 2022-01-06 ENCOUNTER — Ambulatory Visit: Payer: Medicare Other | Admitting: Obstetrics and Gynecology

## 2022-01-07 DIAGNOSIS — M9902 Segmental and somatic dysfunction of thoracic region: Secondary | ICD-10-CM | POA: Diagnosis not present

## 2022-01-07 DIAGNOSIS — M5136 Other intervertebral disc degeneration, lumbar region: Secondary | ICD-10-CM | POA: Diagnosis not present

## 2022-01-07 DIAGNOSIS — M9903 Segmental and somatic dysfunction of lumbar region: Secondary | ICD-10-CM | POA: Diagnosis not present

## 2022-01-07 DIAGNOSIS — M5134 Other intervertebral disc degeneration, thoracic region: Secondary | ICD-10-CM | POA: Diagnosis not present

## 2022-01-18 DIAGNOSIS — H401132 Primary open-angle glaucoma, bilateral, moderate stage: Secondary | ICD-10-CM | POA: Diagnosis not present

## 2022-01-19 ENCOUNTER — Other Ambulatory Visit (HOSPITAL_COMMUNITY): Payer: Medicare Other

## 2022-01-19 DIAGNOSIS — M9903 Segmental and somatic dysfunction of lumbar region: Secondary | ICD-10-CM | POA: Diagnosis not present

## 2022-01-19 DIAGNOSIS — M9905 Segmental and somatic dysfunction of pelvic region: Secondary | ICD-10-CM | POA: Diagnosis not present

## 2022-01-19 DIAGNOSIS — M9904 Segmental and somatic dysfunction of sacral region: Secondary | ICD-10-CM | POA: Diagnosis not present

## 2022-01-19 DIAGNOSIS — M5136 Other intervertebral disc degeneration, lumbar region: Secondary | ICD-10-CM | POA: Diagnosis not present

## 2022-01-20 ENCOUNTER — Ambulatory Visit: Payer: Medicare Other | Admitting: Obstetrics and Gynecology

## 2022-01-26 ENCOUNTER — Ambulatory Visit: Payer: Medicare Other | Admitting: Obstetrics and Gynecology

## 2022-01-27 DIAGNOSIS — M9903 Segmental and somatic dysfunction of lumbar region: Secondary | ICD-10-CM | POA: Diagnosis not present

## 2022-01-27 DIAGNOSIS — M9905 Segmental and somatic dysfunction of pelvic region: Secondary | ICD-10-CM | POA: Diagnosis not present

## 2022-01-27 DIAGNOSIS — M9904 Segmental and somatic dysfunction of sacral region: Secondary | ICD-10-CM | POA: Diagnosis not present

## 2022-01-27 DIAGNOSIS — M5136 Other intervertebral disc degeneration, lumbar region: Secondary | ICD-10-CM | POA: Diagnosis not present

## 2022-01-29 ENCOUNTER — Telehealth (HOSPITAL_COMMUNITY): Payer: Self-pay | Admitting: *Deleted

## 2022-01-29 NOTE — Telephone Encounter (Signed)
Reaching out to patient to offer assistance regarding upcoming cardiac imaging study; pt verbalizes understanding of appt date/time, parking situation and where to check in, pre-test NPO status, and verified current allergies; name and call back number provided for further questions should they arise  Tevis Conger RN Navigator Cardiac Imaging [facility] [phone] office [phone] cell  Patient aware to avoid caffeine 12 hours prior to her cardiac PET scan. 

## 2022-02-01 ENCOUNTER — Encounter: Payer: Self-pay | Admitting: *Deleted

## 2022-02-02 ENCOUNTER — Encounter (HOSPITAL_COMMUNITY)
Admission: RE | Admit: 2022-02-02 | Discharge: 2022-02-02 | Disposition: A | Discharge status: Home / Self Care | Payer: Medicare Other | Source: Ambulatory Visit | Attending: Nurse Practitioner | Admitting: Nurse Practitioner

## 2022-02-02 ENCOUNTER — Encounter (HOSPITAL_COMMUNITY): Payer: Medicare Other

## 2022-02-02 DIAGNOSIS — I2510 Atherosclerotic heart disease of native coronary artery without angina pectoris: Secondary | ICD-10-CM | POA: Insufficient documentation

## 2022-02-02 MED ORDER — REGADENOSON 0.4 MG/5ML IV SOLN
INTRAVENOUS | Status: AC
  Administered 2022-02-02: 0.4 mg via INTRAVENOUS
  Filled 2022-02-02: qty 5

## 2022-02-02 MED ORDER — REGADENOSON 0.4 MG/5ML IV SOLN: 0.4000 mg | Freq: Once | INTRAVENOUS | Status: AC

## 2022-02-02 MED ORDER — RUBIDIUM RB82 GENERATOR (RUBYFILL)
19.0800 | PACK | Freq: Once | INTRAVENOUS | Status: AC
  Administered 2022-02-02: 19.08 via INTRAVENOUS

## 2022-02-02 NOTE — Progress Notes (Signed)
Patient presents for a cardiac PET stress test and tolerated procedure without incident. Patient maintained acceptable vital signs throughout the test and was offered caffeine after test.  Patient ambulated out of department with a steady gait.  However, the imaging agent did not go in. Patient elected to come back another day to repeat the scan.

## 2022-02-03 ENCOUNTER — Ambulatory Visit: Payer: Medicare Other | Admitting: Obstetrics and Gynecology

## 2022-02-08 DIAGNOSIS — M9903 Segmental and somatic dysfunction of lumbar region: Secondary | ICD-10-CM | POA: Diagnosis not present

## 2022-02-08 DIAGNOSIS — M9905 Segmental and somatic dysfunction of pelvic region: Secondary | ICD-10-CM | POA: Diagnosis not present

## 2022-02-08 DIAGNOSIS — M9904 Segmental and somatic dysfunction of sacral region: Secondary | ICD-10-CM | POA: Diagnosis not present

## 2022-02-08 DIAGNOSIS — M5136 Other intervertebral disc degeneration, lumbar region: Secondary | ICD-10-CM | POA: Diagnosis not present

## 2022-02-10 ENCOUNTER — Ambulatory Visit: Payer: Medicare Other | Admitting: Obstetrics and Gynecology

## 2022-02-12 ENCOUNTER — Other Ambulatory Visit: Payer: Self-pay | Admitting: Nurse Practitioner

## 2022-02-17 ENCOUNTER — Ambulatory Visit: Payer: Medicare Other | Admitting: Obstetrics and Gynecology

## 2022-02-17 DIAGNOSIS — M9904 Segmental and somatic dysfunction of sacral region: Secondary | ICD-10-CM | POA: Diagnosis not present

## 2022-02-17 DIAGNOSIS — M9903 Segmental and somatic dysfunction of lumbar region: Secondary | ICD-10-CM | POA: Diagnosis not present

## 2022-02-17 DIAGNOSIS — M5136 Other intervertebral disc degeneration, lumbar region: Secondary | ICD-10-CM | POA: Diagnosis not present

## 2022-02-17 DIAGNOSIS — M9905 Segmental and somatic dysfunction of pelvic region: Secondary | ICD-10-CM | POA: Diagnosis not present

## 2022-02-25 DIAGNOSIS — M9903 Segmental and somatic dysfunction of lumbar region: Secondary | ICD-10-CM | POA: Diagnosis not present

## 2022-02-25 DIAGNOSIS — M5136 Other intervertebral disc degeneration, lumbar region: Secondary | ICD-10-CM | POA: Diagnosis not present

## 2022-02-25 DIAGNOSIS — M9904 Segmental and somatic dysfunction of sacral region: Secondary | ICD-10-CM | POA: Diagnosis not present

## 2022-02-25 DIAGNOSIS — M9905 Segmental and somatic dysfunction of pelvic region: Secondary | ICD-10-CM | POA: Diagnosis not present

## 2022-03-08 DIAGNOSIS — M5136 Other intervertebral disc degeneration, lumbar region: Secondary | ICD-10-CM | POA: Diagnosis not present

## 2022-03-08 DIAGNOSIS — M9904 Segmental and somatic dysfunction of sacral region: Secondary | ICD-10-CM | POA: Diagnosis not present

## 2022-03-08 DIAGNOSIS — M9905 Segmental and somatic dysfunction of pelvic region: Secondary | ICD-10-CM | POA: Diagnosis not present

## 2022-03-08 DIAGNOSIS — M9903 Segmental and somatic dysfunction of lumbar region: Secondary | ICD-10-CM | POA: Diagnosis not present

## 2022-03-11 ENCOUNTER — Ambulatory Visit (INDEPENDENT_AMBULATORY_CARE_PROVIDER_SITE_OTHER): Payer: Medicare Other | Admitting: Orthopedic Surgery

## 2022-03-11 ENCOUNTER — Encounter: Payer: Self-pay | Admitting: Orthopedic Surgery

## 2022-03-11 VITALS — BP 138/88 | HR 65 | Temp 98.0°F | Resp 18 | Ht 66.0 in | Wt 166.2 lb

## 2022-03-11 DIAGNOSIS — H9201 Otalgia, right ear: Secondary | ICD-10-CM | POA: Diagnosis not present

## 2022-03-11 DIAGNOSIS — M7062 Trochanteric bursitis, left hip: Secondary | ICD-10-CM | POA: Diagnosis not present

## 2022-03-11 MED ORDER — PREDNISONE 10 MG PO TABS: ORAL_TABLET | ORAL | 0 refills | Status: AC

## 2022-03-11 NOTE — Progress Notes (Signed)
Careteam: Patient Care Team: Yvonna Alanis, NP as PCP - General (Adult Health Nurse Practitioner) Donato Heinz, MD as PCP - Cardiology (Cardiology) Katy Apo, MD as Consulting Physician (Ophthalmology)  Seen by: Windell Moulding, AGNP-C  PLACE OF SERVICE:  [place] Directive information Does Patient Have a Medical Advance Directive?: Yes, Type of Advance Directive: [place];Living will, Does patient want to make changes to medical advance directive?: No - Patient declined  Allergies  Allergen Reactions   Statins Other (See Comments)    Muscle problems   Allopurinol Rash   Darvocet [Propoxyphene N-Acetaminophen] Rash    Chief Complaint  Patient presents with   Acute Visit    Ear problems and left knee pain it been going for two week, Patient has been taken OTC to help the pain. Also wanted to get advice for to do CT-Scan for Knee pain.     HPI: Patient is a 76 y.o. female seen today for acute visit due to right ear soreness and left knee pain.   She is followed by chiropractor due to SI joint/lower back pain. About 2 weeks ago she began to have increased pain from left hip and knee, denies radiation,describes pain as tightness. Pain worse with increased movement, relieved by resting. She has been walking with a cane. No recent injury or fall. She is also followed by Dr. Ninfa Linden for bilateral hip bursitis.   A few days ago she removed a large piece of wax from right ear. She then used a Qtip to further clean out ear. She noticed some blood on the Qtip. Denies ear pain, changes in hearing or ear drainage.   Review of Systems:  Review of Systems  Constitutional:  Negative for fever and malaise/fatigue.  HENT:  Negative for ear discharge, ear pain, hearing loss and tinnitus.   Respiratory:  Negative for cough, shortness of breath and wheezing.   Cardiovascular:  Negative for chest pain and leg swelling.  Gastrointestinal:  Negative for  abdominal pain.  Genitourinary:  Negative for dysuria.  Musculoskeletal:  Positive for back pain and joint pain. Negative for falls.  Neurological:  Negative for dizziness, weakness and headaches.  Psychiatric/Behavioral:  Negative for depression. The patient is not nervous/anxious.     Past Medical History:  Diagnosis Date   Acute upper respiratory infections of unspecified site    Allergic rhinitis due to pollen    Arthritis    Benign essential hypertension    Bursitis    Cervical spondylosis without myelopathy    Cervicalgia    Controlled type 2 diabetes mellitus without complication, without long-term current use of insulin ([place]) 10/01/2019   Diaphragmatic hernia without mention of obstruction or gangrene    Disorder of bone and cartilage, unspecified    Diverticulosis of colon (without mention of hemorrhage)    Encounter for long-term (current) use of other medications    GERD (gastroesophageal reflux disease)    Glaucoma    High blood sugar    History of bone density study    History of colonoscopy    History of echocardiogram    History of hiatal hernia    History of Papanicolaou smear of cervix    Hyperlipidemia LDL goal < 100    Hypertension    Keratoconjunctivitis sicca, 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) 10/27/2012   Tear film insufficiency, unspecified    Unspecified disorder of kidney and ureter    Unspecified glaucoma(365.9)    Past Surgical History:  Procedure Laterality Date   BACK SURGERY     BELPHAROPTOSIS REPAIR     brow lift   bil   CATARACT EXTRACTION W/ INTRAOCULAR LENS  IMPLANT, BILATERAL Bilateral 12/2014   Dr. Prudencio Burly   CHOLECYSTECTOMY  1993   Dr.Lindsey    DILATION AND CURETTAGE OF UTERUS     GALLBLADDER SURGERY     LUMBAR LAMINECTOMY/DECOMPRESSION  MICRODISCECTOMY N/A 04/21/2017   Procedure: BILATERAL LUMBAR FOUR- LUMBAR FIVE AND LEFT LUMBAR FIVE- SACRAL ONE LAMINOTOMY, MEDIAL FACETECTOMY AND FORAMINOTOMIES;  Surgeon: Jovita Gamma, MD;  Location: [facility];  Service: Neurosurgery;  Laterality: N/A;   NASAL SEPTUM SURGERY     Social History:   reports that she has never smoked. She has never used smokeless tobacco. She reports current alcohol use. She reports that she does not use drugs.  Family History  Problem Relation Age of Onset   Heart attack Mother    Breast cancer Mother    Heart failure Mother    Pancreatitis Father    Diabetes Brother    Heart disease Brother        By-pass surgery   Lupus Cousin     Medications: Patient's Medications  New Prescriptions   No medications on file  Previous Medications   ASPIRIN EC 81 MG TABLET    Take 1 tablet (81 mg total) by mouth daily. Swallow whole.   BRINZOLAMIDE (AZOPT) 1 % OPHTHALMIC SUSPENSION    Place 1 drop into both eyes 2 (two) times daily.   CALCIUM CITRATE-VITAMIN D (CITRACAL+D) 315-200 MG-UNIT PER TABLET    Take 1 tablet by mouth 4 (four) times daily.   CHLORPHENIRAMINE (CHLOR-TRIMETON) 4 MG TABLET    Take 4 mg by mouth daily.   CHOLECALCIFEROL (VITAMIN D) 1000 UNITS TABLET    Take 2,000 Units by mouth daily.    EZETIMIBE (ZETIA) 10 MG TABLET    TAKE 1 TABLET BY MOUTH EVERY DAY   LOSARTAN (COZAAR) 50 MG TABLET    TAKE 1 TABLET BY MOUTH EVERY DAY   METOPROLOL SUCCINATE (TOPROL-XL) 25 MG 24 HR TABLET    TAKE 1 TABLET (25 MG TOTAL) BY MOUTH DAILY.   PANTOPRAZOLE (PROTONIX) 40 MG TABLET    TAKE 1 TABLET BY MOUTH EVERY DAY   ROSUVASTATIN (CRESTOR) 10 MG TABLET    Take 1 tablet (10 mg total) by mouth daily.  Modified Medications   No medications on file  Discontinued Medications   TROSPIUM (SANCTURA) 20 MG TABLET    Take 1 tablet (20 mg total) by mouth 2 (two) times daily.    Physical Exam:  Vitals:   03/11/22 0828  BP: 138/88  Pulse: 65  Resp: 18  Temp: 98 F  (36.7 C)  SpO2: 98%  Weight: 166 lb 4 oz (75.4 kg)  Height: '5\' 6"'$  (1.676 m)   Body mass index is 26.83 kg/m. Wt Readings from Last 3 Encounters:  03/11/22 166 lb 4 oz (75.4 kg)  12/31/21 162 lb 6.4 oz (73.7 kg)  11/17/21 161 lb 6.4 oz (73.2 kg)    Physical Exam Vitals reviewed.  Constitutional:      General: She is not in acute distress. HENT:     Head: Normocephalic.     Right Ear: Tympanic membrane normal.  There is no impacted cerumen.     Left Ear: Tympanic membrane normal. There is no impacted cerumen.     Ears:     Comments: Dried blood to right ear canal, no erythema/swelling/purulent drianage Eyes:     General:        Right eye: No discharge.        Left eye: No discharge.  Cardiovascular:     Rate and Rhythm: Normal rate and regular rhythm.     Pulses: Normal pulses.     Heart sounds: Normal heart sounds.  Pulmonary:     Effort: Pulmonary effort is normal. No respiratory distress.     Breath sounds: Normal breath sounds. No wheezing.  Abdominal:     General: Bowel sounds are normal. There is no distension.     Palpations: Abdomen is soft.     Tenderness: There is no abdominal tenderness.  Musculoskeletal:     Cervical back: Normal and neck supple.     Thoracic back: Normal.     Lumbar back: Tenderness present. No swelling or deformity. Normal range of motion.     Left hip: Tenderness present. No deformity or crepitus. Normal range of motion. Normal strength.     Left knee: No swelling, deformity or effusion. Normal range of motion. No tenderness.     Right lower leg: No edema.     Left lower leg: No edema.     Comments: SI tenderness  Skin:    General: Skin is warm.     Capillary Refill: Capillary refill takes less than 2 seconds.  Neurological:     General: No focal deficit present.     Mental Status: She is alert and oriented to person, place, and time.  Psychiatric:        Mood and Affect: Mood normal.        Behavior: Behavior normal.     Labs  reviewed: Basic Metabolic Panel: Recent Labs    06/22/21 0807 11/17/21 0853 12/28/21 0814  NA 138 135 135  K 4.5 4.6 4.5  CL 102 97 100  CO2 '28 25 27  '$ GLUCOSE 116* 96 100*  BUN '10 12 13  '$ CREATININE 0.75 0.74 0.87  CALCIUM 10.0 9.9 9.7  MG  --  2.1  --   TSH 3.88  --   --    Liver Function Tests: Recent Labs    06/22/21 0807 12/28/21 0814  AST 17 16  ALT 16 14  BILITOT 1.0 0.9  PROT 7.0 6.7   No results for input(s): "LIPASE", "AMYLASE" in the last 8760 hours. No results for input(s): "AMMONIA" in the last 8760 hours. CBC: Recent Labs    06/22/21 0807 12/28/21 0814  WBC 7.3 7.8  NEUTROABS 4,263 4,727  HGB 15.4 14.8  HCT 47.6* 43.7  MCV 92.6 89.4  PLT 251 225   Lipid Panel: Recent Labs    06/22/21 0807 12/28/21 0814  CHOL 119 122  HDL 43* 45*  LDLCALC 54 56  TRIG 137 131  CHOLHDL 2.8 2.7   TSH: Recent Labs    06/22/21 0807  TSH 3.88   A1C: Lab Results  Component Value Date   HGBA1C 5.9 (H) 12/28/2021     Assessment/Plan 1. Trochanteric bursitis of left hip - increased pain x 2 weeks> tightness> improved with rest - suspect muscular in nature - tenderness over SI joint and left lateral hip/ Iliotibial band - will trial prednisone taper for pain relief - also recommend voltaren gel 3-4x/daily -  may continue heat applications - predniSONE (DELTASONE) 10 MG tablet; Take 4 tablets (40 mg total) by mouth daily with breakfast for 1 day, THEN 3 tablets (30 mg total) daily with breakfast for 1 day, THEN 2 tablets (20 mg total) daily with breakfast for 1 day, THEN 1 tablet (10 mg total) daily with breakfast for 1 day, THEN 0.5 tablets (5 mg total) daily with breakfast for 1 day.  Dispense: 10.5 tablet; Refill: 0  2. Right ear pain - suspect trauma from Qtip/wax removal - no sign of infection  Total time: 25 minutes. Greater than 50% of total time spent doing patient education regarding hip/knee pain including symptom/medication management.      Next appt: 07/01/2022  Windell Moulding, [facility] [phone]

## 2022-03-15 ENCOUNTER — Telehealth (HOSPITAL_COMMUNITY): Payer: Self-pay | Admitting: *Deleted

## 2022-03-15 NOTE — Telephone Encounter (Signed)
Reaching out to patient to offer assistance regarding upcoming cardiac imaging study; pt verbalizes understanding of appt date/time, parking situation and where to check in, pre-test NPO status, and verified current allergies; name and call back number provided for further questions should they arise  Quantasia Stegner RN Navigator Cardiac Imaging [facility] [phone] office [phone] cell  Patient aware to avoid caffeine 12 hours prior to her cardiac PET scan. 

## 2022-03-16 ENCOUNTER — Ambulatory Visit (HOSPITAL_COMMUNITY)
Admission: RE | Admit: 2022-03-16 | Discharge: 2022-03-16 | Disposition: A | Discharge status: Home / Self Care | Payer: Medicare Other | Source: Ambulatory Visit | Attending: Nurse Practitioner | Admitting: Nurse Practitioner

## 2022-03-16 DIAGNOSIS — R002 Palpitations: Secondary | ICD-10-CM | POA: Diagnosis not present

## 2022-03-16 DIAGNOSIS — I10 Essential (primary) hypertension: Secondary | ICD-10-CM | POA: Diagnosis not present

## 2022-03-16 DIAGNOSIS — M5136 Other intervertebral disc degeneration, lumbar region: Secondary | ICD-10-CM | POA: Diagnosis not present

## 2022-03-16 DIAGNOSIS — M9905 Segmental and somatic dysfunction of pelvic region: Secondary | ICD-10-CM | POA: Diagnosis not present

## 2022-03-16 DIAGNOSIS — I471 Supraventricular tachycardia, unspecified: Secondary | ICD-10-CM | POA: Insufficient documentation

## 2022-03-16 DIAGNOSIS — I493 Ventricular premature depolarization: Secondary | ICD-10-CM | POA: Diagnosis not present

## 2022-03-16 DIAGNOSIS — I2510 Atherosclerotic heart disease of native coronary artery without angina pectoris: Secondary | ICD-10-CM | POA: Insufficient documentation

## 2022-03-16 DIAGNOSIS — R55 Syncope and collapse: Secondary | ICD-10-CM | POA: Diagnosis not present

## 2022-03-16 DIAGNOSIS — I700 Atherosclerosis of aorta: Secondary | ICD-10-CM | POA: Insufficient documentation

## 2022-03-16 DIAGNOSIS — E785 Hyperlipidemia, unspecified: Secondary | ICD-10-CM | POA: Insufficient documentation

## 2022-03-16 DIAGNOSIS — M9903 Segmental and somatic dysfunction of lumbar region: Secondary | ICD-10-CM | POA: Diagnosis not present

## 2022-03-16 DIAGNOSIS — M9904 Segmental and somatic dysfunction of sacral region: Secondary | ICD-10-CM | POA: Diagnosis not present

## 2022-03-16 LAB — NM PET CT CARDIAC PERFUSION MULTI W/ABSOLUTE BLOODFLOW
MBFR: 3.39
Nuc Rest EF: 56 %
Nuc Stress EF: 72 %
Peak HR: 94 {beats}/min
Rest HR: 68 {beats}/min
Rest MBF: 0.89 ml/g/min
Rest Nuclear Isotope Dose: 19.3 mCi
Rest perfusion cavity size (mL): 52 mL
ST Depression (mm): 0 mm
Stress MBF: 3.02 ml/g/min
Stress Nuclear Isotope Dose: 19.8 mCi
Stress perfusion cavity size (mL): 57 mL
TID: 0.89

## 2022-03-16 MED ORDER — RUBIDIUM RB82 GENERATOR (RUBYFILL)
20.0000 | PACK | Freq: Once | INTRAVENOUS | Status: AC
  Administered 2022-03-16: 19.3 via INTRAVENOUS

## 2022-03-16 MED ORDER — REGADENOSON 0.4 MG/5ML IV SOLN: 0.4000 mg | Freq: Once | INTRAVENOUS | Status: AC

## 2022-03-16 MED ORDER — RUBIDIUM RB82 GENERATOR (RUBYFILL)
20.0000 | PACK | Freq: Once | INTRAVENOUS | Status: AC
  Administered 2022-03-16: 19.8 via INTRAVENOUS

## 2022-03-16 MED ORDER — REGADENOSON 0.4 MG/5ML IV SOLN
INTRAVENOUS | Status: AC
  Administered 2022-03-16: 0.4 mg via INTRAVENOUS
  Filled 2022-03-16: qty 5

## 2022-03-16 NOTE — Progress Notes (Signed)
Patient presents for a cardiac PET stress test and tolerated procedure without incident. Patient maintained acceptable vital signs throughout the test and was offered caffeine after test.  Patient ambulated out of department with a steady gait.  

## 2022-03-18 ENCOUNTER — Telehealth: Payer: Self-pay

## 2022-03-18 NOTE — Telephone Encounter (Signed)
Spoke with pt. Pt was notified of PET stress test results and recommendations. Pt will continue her current medication and follow up as planned.

## 2022-03-18 NOTE — Progress Notes (Signed)
Cardiology Office Note:    Date:  03/19/2022  ID:  Felicia Hall, DOB 1945-12-24, MRN [id number]  PCP:  Yvonna Alanis, NP  Cardiologist:  Donato Heinz, MD  Electrophysiologist:  None   Referring MD: Yvonna Alanis, NP   Chief Complaint  Patient presents with   Palpitations    History of Present Illness:    Felicia Hall is a 76 y.o. female with a hx of T2DM, GERD, hyperlipidemia, hypertension who presents for follow-up.  She was referred by Dr. Mariea Clonts for evaluation of palpitations on 10/24/2019.   She reports that palpitations started in May 2021.  She was watching TV and had an episode where she felt her heart was racing.  Lasted for few minutes and resolved.  She denies any lightheadedness or syncope during this episode.  Then earlier in July 2021 had an episode where she felt like her heart was fluttering, lasted few seconds and resolved.  She reports that she exercises by riding stationary bicycle every day for 30 minutes.  She denies any chest pain or dyspnea.  She denies any lower extremity edema.  No smoking history.  Rare alcohol use.  She drinks one latte every morning, no other caffeine throughout the day.  Brother had CABG in 41s.  She has had issues with myalgias on statins in the past.    Zio patch x14 days on 11/21/2019 showed no significant arrhythmias.  Calcium score on 8/2/221 was 463 (86 percentile).  Echo 02/04/2020 showed LVEF 70 to 75%, normal RV function, grade 1 diastolic dysfunction, mild MR, recorded as severe pulmonary artery systolic pressure elevation but pulmonary pressures appear normal.   Lexiscan Myoview on 02/04/2020 showed normal perfusion, EF 76%.  Echo 04/30/2021 showed normal biventricular function, no significant valvular disease.  Zio patch x 14 days on 05/22/2021 showed no significant arrhythmias.  Stress PET on 03/16/2022 showed normal perfusion, myocardial blood flow reserve 3.39, EF 56%.  Since last clinic visit, she reports he is doing well.  Had an  episode where her heart was racing in August and called EMS.  Workup was unremarkable.  She was started on metoprolol and reports very rare palpitations since starting metoprolol.  She denies any chest pain, dyspnea, lightheadedness, syncope, or lower extremity edema.  Reports BP up to 130s to 140s when she checks at home.  She has been using the stationary bicycle for 20-25 minutes/day, has been limited by knee pain but denies any exertional symptoms.    Past Medical History:  Diagnosis Date   Acute upper respiratory infections of unspecified site    Allergic rhinitis due to pollen    Arthritis    Benign essential hypertension    Bursitis    Cervical spondylosis without myelopathy    Cervicalgia    Controlled type 2 diabetes mellitus without complication, without long-term current use of insulin ([place]) 10/01/2019   Diaphragmatic hernia without mention of obstruction or gangrene    Disorder of bone and cartilage, unspecified    Diverticulosis of colon (without mention of hemorrhage)    Encounter for long-term (current) use of other medications    GERD (gastroesophageal reflux disease)    Glaucoma    High blood sugar    History of bone density study    History of colonoscopy    History of echocardiogram    History of hiatal hernia    History of Papanicolaou smear of cervix    Hyperlipidemia LDL goal < 100    Hypertension  Keratoconjunctivitis sicca, 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 Prudencio Burly   CHOLECYSTECTOMY  1993   Dr.Lindsey    DILATION AND CURETTAGE OF UTERUS     GALLBLADDER SURGERY     LUMBAR LAMINECTOMY/DECOMPRESSION MICRODISCECTOMY N/A 04/21/2017   Procedure: BILATERAL LUMBAR FOUR- LUMBAR FIVE AND LEFT LUMBAR FIVE- SACRAL ONE LAMINOTOMY, MEDIAL FACETECTOMY AND FORAMINOTOMIES;  Surgeon: Jovita Gamma, MD;  Location: [facility];  Service: Neurosurgery;  Laterality: N/A;   NASAL SEPTUM SURGERY      Current Medications: Current Meds  Medication Sig   aspirin EC 81 MG tablet Take 1 tablet (81 mg total) by mouth daily. Swallow whole.   brinzolamide (AZOPT) 1 % ophthalmic suspension Place 1 drop into both eyes 2 (two) times daily.   calcium citrate-vitamin D (CITRACAL+D) 315-200 MG-UNIT per tablet Take 1 tablet by mouth 4 (four) times daily.   chlorpheniramine (CHLOR-TRIMETON) 4 MG tablet Take 4 mg by mouth daily.   cholecalciferol (VITAMIN D) 1000 UNITS tablet Take 2,000 Units by mouth daily.    ezetimibe (ZETIA) 10 MG tablet TAKE 1 TABLET BY MOUTH EVERY DAY   metoprolol succinate (TOPROL-XL) 25 MG 24 hr tablet TAKE 1 TABLET (25 MG TOTAL) BY MOUTH DAILY.   pantoprazole (PROTONIX) 40 MG tablet TAKE 1 TABLET BY MOUTH EVERY DAY   rosuvastatin (CRESTOR) 10 MG tablet Take 1 tablet (10 mg total) by mouth daily.   [DISCONTINUED] losartan (COZAAR) 50 MG tablet TAKE 1 TABLET BY MOUTH EVERY DAY     Allergies:   Statins, Allopurinol, and Darvocet [propoxyphene n-acetaminophen]   Social History   Socioeconomic History   Marital status: Single    Spouse name: Not on file   Number of children: Not on file   Years of education: Not on file   Highest education level: Not on file  Occupational History   Occupation: Herbalist  Tobacco Use   Smoking status: Never   Smokeless tobacco: Never  Vaping Use   Vaping Use: Never used  Substance and Sexual Activity   Alcohol use: Yes    Comment: occasionally   Drug use: No   Sexual activity: Not on  file  Other Topics Concern   Not on file  Social History Narrative   Tobacco use, amount per day now: 0   Past tobacco use, amount per day: 0   How many years did you use tobacco: 0   Alcohol use (drinks per week): 0   Diet:   Do you drink/eat things with caffeine: Yes   Marital status:   Single                               What year were you married?   Do you live in a house, apartment, assisted living, condo, trailer, etc.? House   Is it one or more stories?  One   How many persons live in your home? 1   Do you have pets in your home?( please list) 0   Highest Level of education completed? College   Current or past profession: Herbalist.   Do you exercise?     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 2lbs weights 2-3 times week.   Do you have a living will? Yes    Do you have a DNR form?      No                             If not, do you want to discuss one? No   Do you have signed POA/HPOA forms?    No                    If so, please bring to you appointment      Do you have any difficulty bathing or dressing yourself? No   Do you have any difficulty preparing food or eating? No   Do you have any difficulty managing your medications? No   Do you have any difficulty managing your finances? No   Do you have any difficulty affording your medications? No   Social Determinants of Health   Financial Resource Strain: Low Risk  (09/12/2017)   Overall Financial Resource Strain (CARDIA)    Difficulty of Paying Living Expenses: Not hard at all  Food Insecurity: No Food Insecurity (09/12/2017)   Hunger Vital Sign    Worried About Running Out of Food in the Last Year: Never true    Ran Out of Food in the Last Year: Never true  Transportation Needs: No Transportation Needs (09/12/2017)   PRAPARE - Hydrologist (Medical): No    Lack of Transportation (Non-Medical): No  Physical Activity: Sufficiently Active (09/12/2017)    Exercise Vital Sign    Days of Exercise per Week: 7 days    Minutes of Exercise per Session: 40 min  Stress: Stress Concern Present (09/12/2017)   [place]    Feeling of Stress : Rather much  Social Connections: Moderately Isolated (09/12/2017)   Social Connection and Isolation Panel [NHANES]    Frequency of Communication with Friends and Family: Three times a week    Frequency of Social Gatherings with Friends and Family: Three times a week    Attends Religious Services: Never    Active Member of Clubs or Organizations: No    Attends Archivist Meetings: Never    Marital Status: Never married     Family History: The patient's family history includes Breast cancer in her mother; Diabetes in her brother; Heart attack in her mother; Heart disease in her brother; Heart failure in her mother; Lupus in her cousin; Pancreatitis in her father.  ROS:   Please see the history of present illness.     All other systems reviewed and are negative.  EKGs/Labs/Other Studies Reviewed:    The following studies were reviewed today:   EKG:  EKG is not ordered today.  The ekg ordered at prior clinic visit demonstrates sinus rhythm, first-degree AV block, no ST changes  Recent Labs: 06/22/2021: TSH 3.88 11/17/2021: Magnesium 2.1 12/28/2021: ALT 14;  BUN 13; Creat 0.87; Hemoglobin 14.8; Platelets 225; Potassium 4.5; Sodium 135  Recent Lipid Panel    Component Value Date/Time   CHOL 122 12/28/2021 0814   CHOL 115 12/18/2020 0813   TRIG 131 12/28/2021 0814   HDL 45 (L) 12/28/2021 0814   HDL 45 12/18/2020 0813   CHOLHDL 2.7 12/28/2021 0814   VLDL 36 (H) 08/06/2016 0825   LDLCALC 56 12/28/2021 0814    Physical Exam:    VS:  BP (!) 140/72   Pulse 72   Ht '5\' 6"'$  (1.676 m)   Wt 164 lb 6.4 oz (74.6 kg)   SpO2 96%   BMI 26.53 kg/m     Wt Readings from Last 3 Encounters:  03/19/22 164 lb 6.4 oz (74.6 kg)  03/11/22 166 lb 4  oz (75.4 kg)  12/31/21 162 lb 6.4 oz (73.7 kg)     GEN: Well nourished, well developed in no acute distress HEENT: Normal NECK: No JVD; No carotid bruits LYMPHATICS: No lymphadenopathy CARDIAC: RRR, no murmurs, rubs, gallops RESPIRATORY:  Clear to auscultation without rales, wheezing or rhonchi  ABDOMEN: Soft, non-tender, non-distended MUSCULOSKELETAL:  No edema; No deformity  SKIN: Warm and dry NEUROLOGIC:  Alert and oriented x 3 PSYCHIATRIC:  Normal affect   ASSESSMENT:    1. Coronary artery disease involving native coronary artery of native heart without angina pectoris   2. Essential hypertension   3. Palpitations   4. Syncope and collapse   5. Hyperlipidemia, unspecified hyperlipidemia type     PLAN:     CAD: Calcium score on 8/2/221 was 463 (86 percentile).  She reported atypical lower chest/epigastric pain.  Echo 02/04/2020 showed LVEF 70 to 75%, normal RV function, grade 1 diastolic dysfunction, mild MR, recorded as severe pulmonary artery systolic pressure elevation but pulmonary pressures appear normal.  Lexiscan Myoview on 02/04/2020 showed normal perfusion, EF 76%.  Stress PET on 03/16/2022 showed normal perfusion, myocardial blood flow reserve 3.39, EF 56%. -Continue aspirin 81 mg daily -LDL 76 on 01/29/2020, rosuvastatin increased to 20 mg daily but developed myalgias and was reduced to 10 mg daily.  Zetia 10 mg daily added.  LDL 56 on 12/28/2021  Syncope: Unclear cause.  She reports she thinks she was dehydrated.  Echo 04/30/2021 showed normal biventricular function, no significant valvular disease.  Zio patch x 14 days on 05/22/2021 showed no significant arrhythmias. No recent episodes  Palpitations: Zio patch x14 days on 11/21/2019 showed no significant arrhythmias.  Zio patch x 14 days on 05/22/2021 showed no significant arrhythmias.  She reports palpitations have nearly resolved since starting Toprol-XL, will continue  Hyperlipidemia: LDL 76 on 01/29/2020,  rosuvastatin increased to 20 mg daily but developed myalgias and was reduced to 10 mg daily.  Zetia 10 mg daily added.  LDL 56 on 12/28/2021  Hypertension: On losartan 50 mg daily and Toprol-XL 25 mg daily.  BP elevated in clinic and reports elevated BP at home, will increase losartan to 100 mg daily.  Check BMET in 1 week.  Asked to check BP daily for next 2 weeks and call with results  T2DM: A1c improved to 5.9% on 12/29/2021  RTC in 6 months   Medication Adjustments/Labs and Tests Ordered: Current medicines are reviewed at length with the patient today.  Concerns regarding medicines are outlined above.  Orders Placed This Encounter  Procedures   Basic metabolic panel   Meds ordered this encounter  Medications   losartan (COZAAR) 100 MG tablet    Sig:  Take 1 tablet (100 mg total) by mouth daily.    Dispense:  90 tablet    Refill:  3    Dose increase    Patient Instructions  Medication Instructions:  INCREASE Losartan to 100 mg daily  Please check your blood pressure at home daily, write it down.  Call the office or send message via Mychart with the readings in 2 weeks for Dr. Gardiner Rhyme to review.   *If you need a refill on your cardiac medications before your next appointment, please call your pharmacy*   Lab Work: Please return for labs in 1 week (BMET)  Our in office lab hours are Monday-Friday 8:00-4:00, closed for lunch 12:45-1:45 pm.  No appointment needed.  [facility] locations:   [address] (Dr. Kennon Holter office) - [place] ([facility]) - [address] [place] [address] - [address] [place] - [address] ([facility])  Follow-Up: At [facility], you and your health needs are our  priority.  As part of our continuing mission to provide you with exceptional heart care, we have created designated Provider Care Teams.  These Care Teams include your primary Cardiologist (physician) and Advanced Practice Providers (APPs -  Physician Assistants and Nurse Practitioners) who all work together to provide you with the care you need, when you need it.  We recommend signing up for the patient portal called "MyChart".  Sign up information is provided on this After Visit Summary.  MyChart is used to connect with patients for Virtual Visits (Telemedicine).  Patients are able to view lab/test results, encounter notes, upcoming appointments, etc.  Non-urgent messages can be sent to your provider as well.   To learn more about what you can do with MyChart, go to [email or web address].    Your next appointment:   6 month(s)  The format for your next appointment:   In Person  Provider:   Donato Heinz, MD     Other Instructions Recommend [facility]        Signed, Donato Heinz, MD  03/19/2022 8:39 AM    [place]

## 2022-03-19 ENCOUNTER — Encounter: Payer: Self-pay | Admitting: Cardiology

## 2022-03-19 ENCOUNTER — Ambulatory Visit: Payer: Medicare Other | Attending: Cardiology | Admitting: Cardiology

## 2022-03-19 VITALS — BP 140/72 | HR 72 | Ht 66.0 in | Wt 164.4 lb

## 2022-03-19 DIAGNOSIS — R55 Syncope and collapse: Secondary | ICD-10-CM

## 2022-03-19 DIAGNOSIS — I10 Essential (primary) hypertension: Secondary | ICD-10-CM | POA: Diagnosis not present

## 2022-03-19 DIAGNOSIS — R002 Palpitations: Secondary | ICD-10-CM | POA: Diagnosis not present

## 2022-03-19 DIAGNOSIS — E785 Hyperlipidemia, unspecified: Secondary | ICD-10-CM | POA: Diagnosis not present

## 2022-03-19 DIAGNOSIS — I2510 Atherosclerotic heart disease of native coronary artery without angina pectoris: Secondary | ICD-10-CM

## 2022-03-19 MED ORDER — LOSARTAN POTASSIUM 100 MG PO TABS: 100.0000 mg | ORAL_TABLET | Freq: Every day | ORAL | 3 refills | Status: DC

## 2022-03-19 NOTE — Patient Instructions (Signed)
Medication Instructions:  INCREASE Losartan to 100 mg daily  Please check your blood pressure at home daily, write it down.  Call the office or send message via Mychart with the readings in 2 weeks for Dr. Gardiner Rhyme to review.   *If you need a refill on your cardiac medications before your next appointment, please call your pharmacy*   Lab Work: Please return for labs in 1 week (BMET)  Our in office lab hours are Monday-Friday 8:00-4:00, closed for lunch 12:45-1:45 pm.  No appointment needed.  [facility] locations:   [address] (Dr. Kennon Holter office) - [place] ([facility]) - [address] [place] [address] - [address] [place] - [address] ([facility])  Follow-Up: At [facility], you and your health needs are our priority.  As part of our continuing mission to provide you with exceptional heart care, we have created designated Provider Care Teams.  These Care Teams include your primary Cardiologist (physician) and Advanced Practice Providers (APPs -  Physician Assistants and Nurse Practitioners) who all work together to provide you with the care you need, when you need it.  We recommend signing up for the patient portal called "MyChart".  Sign up information is provided on this After Visit Summary.  MyChart is used to connect with patients for Virtual Visits (Telemedicine).  Patients are able to view lab/test results, encounter notes, upcoming appointments, etc.  Non-urgent messages can be sent to your provider as well.   To learn more about what you can do with MyChart, go to [email or web address].    Your next appointment:   6 month(s)  The format for your next appointment:   In  Person  Provider:   Donato Heinz, MD     Other Instructions Recommend [facility]

## 2022-03-25 DIAGNOSIS — M9905 Segmental and somatic dysfunction of pelvic region: Secondary | ICD-10-CM | POA: Diagnosis not present

## 2022-03-25 DIAGNOSIS — M5136 Other intervertebral disc degeneration, lumbar region: Secondary | ICD-10-CM | POA: Diagnosis not present

## 2022-03-25 DIAGNOSIS — M9903 Segmental and somatic dysfunction of lumbar region: Secondary | ICD-10-CM | POA: Diagnosis not present

## 2022-03-25 DIAGNOSIS — M9904 Segmental and somatic dysfunction of sacral region: Secondary | ICD-10-CM | POA: Diagnosis not present

## 2022-03-26 DIAGNOSIS — I2510 Atherosclerotic heart disease of native coronary artery without angina pectoris: Secondary | ICD-10-CM | POA: Diagnosis not present

## 2022-03-26 DIAGNOSIS — I10 Essential (primary) hypertension: Secondary | ICD-10-CM | POA: Diagnosis not present

## 2022-03-26 LAB — BASIC METABOLIC PANEL
BUN/Creatinine Ratio: 20 (ref 12–28)
BUN: 15 mg/dL (ref 8–27)
CO2: 24 mmol/L (ref 20–29)
Calcium: 9.9 mg/dL (ref 8.7–10.3)
Chloride: 97 mmol/L (ref 96–106)
Creatinine, Ser: 0.75 mg/dL (ref 0.57–1.00)
Glucose: 122 mg/dL — ABNORMAL HIGH (ref 70–99)
Potassium: 4.6 mmol/L (ref 3.5–5.2)
Sodium: 132 mmol/L — ABNORMAL LOW (ref 134–144)
eGFR: 82 mL/min/{1.73_m2} (ref >59)

## 2022-03-29 ENCOUNTER — Other Ambulatory Visit: Payer: Self-pay | Admitting: *Deleted

## 2022-03-29 DIAGNOSIS — E871 Hypo-osmolality and hyponatremia: Secondary | ICD-10-CM

## 2022-04-01 ENCOUNTER — Other Ambulatory Visit: Payer: Self-pay | Admitting: Cardiology

## 2022-04-02 ENCOUNTER — Other Ambulatory Visit: Payer: Self-pay | Admitting: *Deleted

## 2022-04-02 DIAGNOSIS — E871 Hypo-osmolality and hyponatremia: Secondary | ICD-10-CM

## 2022-04-07 DIAGNOSIS — M9903 Segmental and somatic dysfunction of lumbar region: Secondary | ICD-10-CM | POA: Diagnosis not present

## 2022-04-07 DIAGNOSIS — M9905 Segmental and somatic dysfunction of pelvic region: Secondary | ICD-10-CM | POA: Diagnosis not present

## 2022-04-07 DIAGNOSIS — M9904 Segmental and somatic dysfunction of sacral region: Secondary | ICD-10-CM | POA: Diagnosis not present

## 2022-04-07 DIAGNOSIS — M5136 Other intervertebral disc degeneration, lumbar region: Secondary | ICD-10-CM | POA: Diagnosis not present

## 2022-04-19 DIAGNOSIS — M9903 Segmental and somatic dysfunction of lumbar region: Secondary | ICD-10-CM | POA: Diagnosis not present

## 2022-04-19 DIAGNOSIS — M5136 Other intervertebral disc degeneration, lumbar region: Secondary | ICD-10-CM | POA: Diagnosis not present

## 2022-04-19 DIAGNOSIS — M9905 Segmental and somatic dysfunction of pelvic region: Secondary | ICD-10-CM | POA: Diagnosis not present

## 2022-04-19 DIAGNOSIS — M9904 Segmental and somatic dysfunction of sacral region: Secondary | ICD-10-CM | POA: Diagnosis not present

## 2022-04-26 DIAGNOSIS — E871 Hypo-osmolality and hyponatremia: Secondary | ICD-10-CM | POA: Diagnosis not present

## 2022-04-26 LAB — BASIC METABOLIC PANEL
BUN/Creatinine Ratio: 18 (ref 12–28)
BUN: 12 mg/dL (ref 8–27)
CO2: 25 mmol/L (ref 20–29)
Calcium: 9.9 mg/dL (ref 8.7–10.3)
Chloride: 97 mmol/L (ref 96–106)
Creatinine, Ser: 0.68 mg/dL (ref 0.57–1.00)
Glucose: 73 mg/dL (ref 70–99)
Potassium: 4.6 mmol/L (ref 3.5–5.2)
Sodium: 134 mmol/L (ref 134–144)
eGFR: 90 mL/min/{1.73_m2} (ref >59)

## 2022-04-29 ENCOUNTER — Encounter: Payer: Self-pay | Admitting: *Deleted

## 2022-04-29 DIAGNOSIS — M9903 Segmental and somatic dysfunction of lumbar region: Secondary | ICD-10-CM | POA: Diagnosis not present

## 2022-04-29 DIAGNOSIS — M9904 Segmental and somatic dysfunction of sacral region: Secondary | ICD-10-CM | POA: Diagnosis not present

## 2022-04-29 DIAGNOSIS — M9905 Segmental and somatic dysfunction of pelvic region: Secondary | ICD-10-CM | POA: Diagnosis not present

## 2022-04-29 DIAGNOSIS — M5136 Other intervertebral disc degeneration, lumbar region: Secondary | ICD-10-CM | POA: Diagnosis not present

## 2022-05-10 DIAGNOSIS — M9905 Segmental and somatic dysfunction of pelvic region: Secondary | ICD-10-CM | POA: Diagnosis not present

## 2022-05-10 DIAGNOSIS — M5136 Other intervertebral disc degeneration, lumbar region: Secondary | ICD-10-CM | POA: Diagnosis not present

## 2022-05-10 DIAGNOSIS — M9903 Segmental and somatic dysfunction of lumbar region: Secondary | ICD-10-CM | POA: Diagnosis not present

## 2022-05-10 DIAGNOSIS — M9904 Segmental and somatic dysfunction of sacral region: Secondary | ICD-10-CM | POA: Diagnosis not present

## 2022-05-18 LAB — MICROALBUMIN / CREATININE URINE RATIO: Microalb Creat Ratio: 29.99

## 2022-05-20 DIAGNOSIS — M9905 Segmental and somatic dysfunction of pelvic region: Secondary | ICD-10-CM | POA: Diagnosis not present

## 2022-05-20 DIAGNOSIS — M9903 Segmental and somatic dysfunction of lumbar region: Secondary | ICD-10-CM | POA: Diagnosis not present

## 2022-05-20 DIAGNOSIS — M9904 Segmental and somatic dysfunction of sacral region: Secondary | ICD-10-CM | POA: Diagnosis not present

## 2022-05-20 DIAGNOSIS — M5136 Other intervertebral disc degeneration, lumbar region: Secondary | ICD-10-CM | POA: Diagnosis not present

## 2022-05-31 DIAGNOSIS — M9905 Segmental and somatic dysfunction of pelvic region: Secondary | ICD-10-CM | POA: Diagnosis not present

## 2022-05-31 DIAGNOSIS — M9903 Segmental and somatic dysfunction of lumbar region: Secondary | ICD-10-CM | POA: Diagnosis not present

## 2022-05-31 DIAGNOSIS — M9904 Segmental and somatic dysfunction of sacral region: Secondary | ICD-10-CM | POA: Diagnosis not present

## 2022-05-31 DIAGNOSIS — M5136 Other intervertebral disc degeneration, lumbar region: Secondary | ICD-10-CM | POA: Diagnosis not present

## 2022-06-08 DIAGNOSIS — X32XXXD Exposure to sunlight, subsequent encounter: Secondary | ICD-10-CM | POA: Diagnosis not present

## 2022-06-08 DIAGNOSIS — L570 Actinic keratosis: Secondary | ICD-10-CM | POA: Diagnosis not present

## 2022-06-10 DIAGNOSIS — M9904 Segmental and somatic dysfunction of sacral region: Secondary | ICD-10-CM | POA: Diagnosis not present

## 2022-06-10 DIAGNOSIS — M9903 Segmental and somatic dysfunction of lumbar region: Secondary | ICD-10-CM | POA: Diagnosis not present

## 2022-06-10 DIAGNOSIS — M9905 Segmental and somatic dysfunction of pelvic region: Secondary | ICD-10-CM | POA: Diagnosis not present

## 2022-06-10 DIAGNOSIS — M5136 Other intervertebral disc degeneration, lumbar region: Secondary | ICD-10-CM | POA: Diagnosis not present

## 2022-06-17 ENCOUNTER — Ambulatory Visit (INDEPENDENT_AMBULATORY_CARE_PROVIDER_SITE_OTHER): Payer: Medicare Other | Admitting: Orthopedic Surgery

## 2022-06-17 ENCOUNTER — Encounter: Payer: Self-pay | Admitting: Orthopedic Surgery

## 2022-06-17 VITALS — BP 128/88 | HR 73 | Temp 97.2°F | Resp 16 | Ht 66.0 in | Wt 172.2 lb

## 2022-06-17 DIAGNOSIS — I2510 Atherosclerotic heart disease of native coronary artery without angina pectoris: Secondary | ICD-10-CM | POA: Diagnosis not present

## 2022-06-17 DIAGNOSIS — E782 Mixed hyperlipidemia: Secondary | ICD-10-CM | POA: Diagnosis not present

## 2022-06-17 DIAGNOSIS — I10 Essential (primary) hypertension: Secondary | ICD-10-CM | POA: Diagnosis not present

## 2022-06-17 DIAGNOSIS — E119 Type 2 diabetes mellitus without complications: Secondary | ICD-10-CM | POA: Diagnosis not present

## 2022-06-17 DIAGNOSIS — R5383 Other fatigue: Secondary | ICD-10-CM

## 2022-06-17 DIAGNOSIS — Z6827 Body mass index (BMI) 27.0-27.9, adult: Secondary | ICD-10-CM

## 2022-06-17 NOTE — Patient Instructions (Signed)
Please come fasting to next lab appointment> only drink black coffee or water  Limit snacking in order to help weight   Download " My fitness pal " app on your phone> track your calories for a few days  Maintain weight 1500 calories> need to reduce daily intake by about 100-200 calories to lose

## 2022-06-17 NOTE — Progress Notes (Signed)
Careteam: Patient Care Team: Yvonna Alanis, NP as PCP - General (Adult Health Nurse Practitioner) Donato Heinz, MD as PCP - Cardiology (Cardiology) Katy Apo, MD as Consulting Physician (Ophthalmology)  Seen by: Windell Moulding, AGNP-C  PLACE OF SERVICE:  [place] Directive information Does Patient Have a Medical Advance Directive?: Yes, Type of Advance Directive: [place];Living will, Does patient want to make changes to medical advance directive?: No - Patient declined  Allergies  Allergen Reactions   Statins Other (See Comments)    Muscle problems   Allopurinol Rash   Darvocet [Propoxyphene N-Acetaminophen] Rash    Chief Complaint  Patient presents with   Medical Management of Chronic Issues    Hypertension control.   Health Maintenance    Discuss the need for AWV, and Diabetic kidney evaluation.   Immunizations    Discuss the need for [employer]. NCIR Verified.      HPI: Patient is a 77 y.o. female seen today for medical management of chronic condition.   No health concerns today.   Followed by cardiology. She had Na+ 132 > rechecked 134 (01/15). Remains on losartan (recently increased) and metoprolol. Blood pressure controlled. She checks at home and reports SBP< 120's. Denies chest pain, sob, headaches, blurred vision.   Ongoing weight gain with fatigue. TSH normal last year. She is exercising daily. Admits to snacking with fruit and pita chips.   Plans to get RSV vaccine before cruise next month.         Review of Systems:  Review of Systems  Constitutional:  Positive for malaise/fatigue. Negative for chills and fever.  HENT:  Negative for congestion.   Eyes:  Negative for blurred vision and double vision.  Respiratory:  Negative for cough, shortness of breath and wheezing.   Cardiovascular:  Negative for chest pain and leg swelling.  Gastrointestinal:  Negative for abdominal pain and blood in stool.   Genitourinary:  Negative for dysuria.  Musculoskeletal:  Negative for falls and joint pain.  Skin:  Negative for rash.  Neurological:  Negative for dizziness, weakness and headaches.  Psychiatric/Behavioral:  Negative for depression. The patient is not nervous/anxious and does not have insomnia.     Past Medical History:  Diagnosis Date   Acute upper respiratory infections of unspecified site    Allergic rhinitis due to pollen    Arthritis    Benign essential hypertension    Bursitis    Cervical spondylosis without myelopathy    Cervicalgia    Controlled type 2 diabetes mellitus without complication, without long-term current use of insulin ([place]) 10/01/2019   Diaphragmatic hernia without mention of obstruction or gangrene    Disorder of bone and cartilage, unspecified    Diverticulosis of colon (without mention of hemorrhage)    Encounter for long-term (current) use of other medications    GERD (gastroesophageal reflux disease)    Glaucoma    High blood sugar    History of bone density study    History of colonoscopy    History of echocardiogram    History of hiatal hernia    History of Papanicolaou smear of cervix    Hyperlipidemia LDL goal < 100    Hypertension    Keratoconjunctivitis sicca, 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 Prudencio Burly   CHOLECYSTECTOMY  1993   Dr.Lindsey    DILATION AND CURETTAGE OF UTERUS     GALLBLADDER SURGERY     LUMBAR  LAMINECTOMY/DECOMPRESSION MICRODISCECTOMY N/A 04/21/2017   Procedure: BILATERAL LUMBAR FOUR- LUMBAR FIVE AND LEFT LUMBAR FIVE- SACRAL ONE LAMINOTOMY, MEDIAL FACETECTOMY AND FORAMINOTOMIES;  Surgeon: Jovita Gamma, MD;  Location: [facility];  Service: Neurosurgery;  Laterality: N/A;   NASAL SEPTUM SURGERY     Social History:   reports that she has never smoked. She has never used smokeless tobacco. She reports current alcohol use. She reports that she does not use drugs.  Family History  Problem Relation Age of Onset   Heart attack Mother    Breast cancer Mother    Heart failure Mother    Pancreatitis Father    Diabetes Brother    Heart disease Brother        By-pass surgery   Lupus Cousin     Medications: Patient's Medications  New Prescriptions   No medications on file  Previous Medications   ASPIRIN EC 81 MG TABLET    Take 1 tablet (81 mg total) by mouth daily. Swallow whole.   BRINZOLAMIDE (AZOPT) 1 % OPHTHALMIC SUSPENSION    Place 1 drop into both eyes 2 (two) times daily.   CALCIUM CITRATE-VITAMIN D (CITRACAL+D) 315-200 MG-UNIT PER TABLET    Take 1 tablet by mouth 4 (four) times daily.   CHLORPHENIRAMINE (CHLOR-TRIMETON) 4 MG TABLET    Take 4 mg by mouth daily.   CHOLECALCIFEROL (VITAMIN D) 1000 UNITS TABLET    Take 2,000 Units by mouth daily.    EZETIMIBE (ZETIA) 10 MG TABLET    TAKE 1 TABLET BY MOUTH EVERY DAY   LOSARTAN (COZAAR) 100 MG TABLET    Take 1 tablet (100 mg total) by mouth daily.   METOPROLOL SUCCINATE (TOPROL-XL) 25 MG 24 HR TABLET    TAKE 1 TABLET (25 MG TOTAL) BY MOUTH DAILY.   PANTOPRAZOLE (PROTONIX) 40 MG TABLET    TAKE 1 TABLET BY MOUTH EVERY DAY   ROSUVASTATIN (CRESTOR) 10 MG TABLET    Take 1 tablet (10 mg total) by mouth daily.  Modified Medications   No medications on file  Discontinued Medications   No medications on file    Physical Exam:  Vitals:   06/17/22 1010  BP: 128/88  Pulse: 73  Resp: 16  Temp: (!) 97.2 F (36.2 C)  SpO2: 96%   Weight: 172 lb 3.2 oz (78.1 kg)  Height: '5\' 6"'$  (1.676 m)   Body mass index is 27.79 kg/m. Wt Readings from Last 3 Encounters:  06/17/22 172 lb 3.2 oz (78.1 kg)  03/19/22 164 lb 6.4 oz (74.6 kg)  03/11/22 166 lb 4 oz (75.4 kg)    Physical Exam Vitals reviewed.  Constitutional:      General: She is not in acute distress. HENT:     Head: Normocephalic.  Eyes:     General:        Right eye: No discharge.        Left eye: No discharge.  Cardiovascular:  Rate and Rhythm: Normal rate and regular rhythm.     Pulses: Normal pulses.     Heart sounds: Normal heart sounds.  Pulmonary:     Effort: Pulmonary effort is normal. No respiratory distress.     Breath sounds: Normal breath sounds. No wheezing.  Abdominal:     General: Bowel sounds are normal. There is no distension.     Palpations: Abdomen is soft.     Tenderness: There is no abdominal tenderness.  Musculoskeletal:     Cervical back: Neck supple.     Right lower leg: No edema.     Left lower leg: No edema.  Skin:    General: Skin is warm and dry.     Capillary Refill: Capillary refill takes less than 2 seconds.  Neurological:     General: No focal deficit present.     Mental Status: She is alert and oriented to person, place, and time.  Psychiatric:        Mood and Affect: Mood normal.        Behavior: Behavior normal.     Labs reviewed: Basic Metabolic Panel: Recent Labs    06/22/21 0807 11/17/21 0853 12/28/21 0814 03/26/22 1140 04/26/22 0828  NA 138 135 135 132* 134  K 4.5 4.6 4.5 4.6 4.6  CL 102 97 100 97 97  CO2 '28 25 27 24 25  '$ GLUCOSE 116* 96 100* 122* 73  BUN '10 12 13 15 12  '$ CREATININE 0.75 0.74 0.87 0.75 0.68  CALCIUM 10.0 9.9 9.7 9.9 9.9  MG  --  2.1  --   --   --   TSH 3.88  --   --   --   --    Liver Function Tests: Recent Labs    06/22/21 0807 12/28/21 0814  AST 17 16  ALT 16 14  BILITOT 1.0 0.9  PROT 7.0 6.7   No results for input(s): "LIPASE", "AMYLASE" in the last 8760  hours. No results for input(s): "AMMONIA" in the last 8760 hours. CBC: Recent Labs    06/22/21 0807 12/28/21 0814  WBC 7.3 7.8  NEUTROABS 4,263 4,727  HGB 15.4 14.8  HCT 47.6* 43.7  MCV 92.6 89.4  PLT 251 225   Lipid Panel: Recent Labs    06/22/21 0807 12/28/21 0814  CHOL 119 122  HDL 43* 45*  LDLCALC 54 56  TRIG 137 131  CHOLHDL 2.8 2.7   TSH: Recent Labs    06/22/21 0807  TSH 3.88   A1C: Lab Results  Component Value Date   HGBA1C 5.9 (H) 12/28/2021     Assessment/Plan 1. Benign essential hypertension - controlled - BUN/creat 18/0.68 04/26/2022 - cont losartan (increased 03/2022) and metoprolol  2. Mixed hyperlipidemia - LDL 56 12/2021 - cont Zetia (added 03/2022) and Crestor  3. Coronary artery disease involving native coronary artery of native heart without angina pectoris - 08/08 EKG sinus rhythm with 1st degree heart block - Echo LVEF 70-75% 2021 - stress PET 03/2022> normal perfusion - cont asa and statin  4. Controlled type 2 diabetes mellitus without complication, without long-term current use of insulin ([facility]) - A1c 5.9 (12/2021)> was 6.1 (06/2021)> 6.5 (09/2019) - no hypoglycemias - not on medication - cont asa, ARB, statin - urine microalbumin completed 02/06 with home insurance visit  5. Fatigue, unspecified type - ongoing - TSH 3.88 06/2021 - recheck level today  6. BMI 27.0- 27.9, adult - BMI 27.79 - recommend counting calories> discussed " My Fitness  Pal" app - cont exercise 150 min/week - limit snacking  Total time: 28 minutes. Greater than 50% of total time spent doing patient education regarding health maintenance, T2DM, HTN, HLD, and fatigue including symptom/medication management.     Next appt: 07/01/2022  Windell Moulding, [facility] [phone]

## 2022-06-21 DIAGNOSIS — M9905 Segmental and somatic dysfunction of pelvic region: Secondary | ICD-10-CM | POA: Diagnosis not present

## 2022-06-21 DIAGNOSIS — M5136 Other intervertebral disc degeneration, lumbar region: Secondary | ICD-10-CM | POA: Diagnosis not present

## 2022-06-21 DIAGNOSIS — M9903 Segmental and somatic dysfunction of lumbar region: Secondary | ICD-10-CM | POA: Diagnosis not present

## 2022-06-21 DIAGNOSIS — M9904 Segmental and somatic dysfunction of sacral region: Secondary | ICD-10-CM | POA: Diagnosis not present

## 2022-06-24 DIAGNOSIS — H401132 Primary open-angle glaucoma, bilateral, moderate stage: Secondary | ICD-10-CM | POA: Diagnosis not present

## 2022-06-28 ENCOUNTER — Other Ambulatory Visit: Payer: Medicare Other

## 2022-06-28 DIAGNOSIS — E782 Mixed hyperlipidemia: Secondary | ICD-10-CM | POA: Diagnosis not present

## 2022-06-28 DIAGNOSIS — I10 Essential (primary) hypertension: Secondary | ICD-10-CM | POA: Diagnosis not present

## 2022-06-28 DIAGNOSIS — E119 Type 2 diabetes mellitus without complications: Secondary | ICD-10-CM

## 2022-06-28 DIAGNOSIS — R5383 Other fatigue: Secondary | ICD-10-CM | POA: Diagnosis not present

## 2022-06-29 ENCOUNTER — Other Ambulatory Visit (HOSPITAL_BASED_OUTPATIENT_CLINIC_OR_DEPARTMENT_OTHER): Payer: Self-pay

## 2022-06-29 ENCOUNTER — Other Ambulatory Visit: Payer: Self-pay | Admitting: Orthopedic Surgery

## 2022-06-29 DIAGNOSIS — E1165 Type 2 diabetes mellitus with hyperglycemia: Secondary | ICD-10-CM

## 2022-06-29 LAB — CBC WITH DIFFERENTIAL/PLATELET
Absolute Monocytes: 614 cells/uL (ref 200–950)
Basophils Absolute: 111 cells/uL (ref 0–200)
Basophils Relative: 1.5 %
Eosinophils Absolute: 370 cells/uL (ref 15–500)
Eosinophils Relative: 5 %
HCT: 44.6 % (ref 35.0–45.0)
Hemoglobin: 14.9 g/dL (ref 11.7–15.5)
Lymphs Abs: 2161 cells/uL (ref 850–3900)
MCH: 30.5 pg (ref 27.0–33.0)
MCHC: 33.4 g/dL (ref 32.0–36.0)
MCV: 91.4 fL (ref 80.0–100.0)
MPV: 10 fL (ref 7.5–12.5)
Monocytes Relative: 8.3 %
Neutro Abs: 4144 cells/uL (ref 1500–7800)
Neutrophils Relative %: 56 %
Platelets: 254 10*3/uL (ref 140–400)
RBC: 4.88 10*6/uL (ref 3.80–5.10)
RDW: 11.9 % (ref 11.0–15.0)
Total Lymphocyte: 29.2 %
WBC: 7.4 10*3/uL (ref 3.8–10.8)

## 2022-06-29 LAB — LIPID PANEL
Cholesterol: 137 mg/dL (ref <200)
HDL: 49 mg/dL — ABNORMAL LOW (ref >=50)
LDL Cholesterol (Calc): 64 mg/dL (calc)
Non-HDL Cholesterol (Calc): 88 mg/dL (calc) (ref <130)
Total CHOL/HDL Ratio: 2.8 (calc) (ref <5.0)
Triglycerides: 166 mg/dL — ABNORMAL HIGH (ref <150)

## 2022-06-29 LAB — COMPLETE METABOLIC PANEL WITH GFR
AG Ratio: 1.8 (calc) (ref 1.0–2.5)
ALT: 18 U/L (ref 6–29)
AST: 19 U/L (ref 10–35)
Albumin: 4.5 g/dL (ref 3.6–5.1)
Alkaline phosphatase (APISO): 53 U/L (ref 37–153)
BUN: 13 mg/dL (ref 7–25)
CO2: 26 mmol/L (ref 20–32)
Calcium: 9.8 mg/dL (ref 8.6–10.4)
Chloride: 99 mmol/L (ref 98–110)
Creat: 0.74 mg/dL (ref 0.60–1.00)
Globulin: 2.5 g/dL (calc) (ref 1.9–3.7)
Glucose, Bld: 111 mg/dL — ABNORMAL HIGH (ref 65–99)
Potassium: 4.7 mmol/L (ref 3.5–5.3)
Sodium: 133 mmol/L — ABNORMAL LOW (ref 135–146)
Total Bilirubin: 1 mg/dL (ref 0.2–1.2)
Total Protein: 7 g/dL (ref 6.1–8.1)
eGFR: 84 mL/min/{1.73_m2} (ref >=60)

## 2022-06-29 LAB — HEMOGLOBIN A1C
Hgb A1c MFr Bld: 6.5 % of total Hgb — ABNORMAL HIGH (ref <5.7)
Mean Plasma Glucose: 140 mg/dL
eAG (mmol/L): 7.7 mmol/L

## 2022-06-29 LAB — TSH: TSH: 3.47 mIU/L (ref 0.40–4.50)

## 2022-06-29 MED ORDER — AREXVY 120 MCG/0.5ML IM SUSR
INTRAMUSCULAR | 0 refills | Status: DC
  Filled 2022-06-29: qty 0.5, 1d supply, fill #0

## 2022-06-29 MED ORDER — METFORMIN HCL 500 MG PO TABS: 250.0000 mg | ORAL_TABLET | Freq: Every day | ORAL | 3 refills | Status: DC

## 2022-07-01 ENCOUNTER — Encounter: Payer: Self-pay | Admitting: Orthopedic Surgery

## 2022-07-01 ENCOUNTER — Ambulatory Visit (INDEPENDENT_AMBULATORY_CARE_PROVIDER_SITE_OTHER): Payer: Medicare Other | Admitting: Orthopedic Surgery

## 2022-07-01 VITALS — BP 122/60 | HR 69 | Temp 97.4°F | Resp 16 | Ht 66.0 in | Wt 171.8 lb

## 2022-07-01 DIAGNOSIS — M9905 Segmental and somatic dysfunction of pelvic region: Secondary | ICD-10-CM | POA: Diagnosis not present

## 2022-07-01 DIAGNOSIS — M5136 Other intervertebral disc degeneration, lumbar region: Secondary | ICD-10-CM | POA: Diagnosis not present

## 2022-07-01 DIAGNOSIS — Z0000 Encounter for general adult medical examination without abnormal findings: Secondary | ICD-10-CM

## 2022-07-01 DIAGNOSIS — M9904 Segmental and somatic dysfunction of sacral region: Secondary | ICD-10-CM | POA: Diagnosis not present

## 2022-07-01 DIAGNOSIS — M9903 Segmental and somatic dysfunction of lumbar region: Secondary | ICD-10-CM | POA: Diagnosis not present

## 2022-07-01 NOTE — Patient Instructions (Addendum)
  Felicia Hall , Thank you for taking time to come for your Medicare Wellness Visit. I appreciate your ongoing commitment to your health goals. Please review the following plan we discussed and let me know if I can assist you in the future.   These are the goals we discussed:  Goals      Weight (lb) < 160 lb (72.6 kg)     Starting 08/07/2016 I would like to lose weight by snacking less.     Weight (lb) < 200 lb (90.7 kg)     I would like to loss weight      Weight (lb) < 200 lb (90.7 kg)        This is a list of the screening recommended for you and due dates:  Health Maintenance  Topic Date Due   Yearly kidney health urinalysis for diabetes  Never done   COVID-19 Vaccine (7 - 2023-24 season) 12/10/2022*   Flu Shot  12/24/2022*   Eye exam for diabetics  08/11/2022   Hemoglobin A1C  12/29/2022   Complete foot exam   01/01/2023   Yearly kidney function blood test for diabetes  06/28/2023   Medicare Annual Wellness Visit  07/01/2023   DTaP/Tdap/Td vaccine (2 - Td or Tdap) 05/22/2029   Pneumonia Vaccine  Completed   DEXA scan (bone density measurement)  Completed   Hepatitis C Screening: USPSTF Recommendation to screen - Ages 58-79 yo.  Completed   Zoster (Shingles) Vaccine  Completed   HPV Vaccine  Aged Out   Cologuard (Stool DNA test)  Discontinued  *Topic was postponed. The date shown is not the original due date.   Please schedule mammogram and bone density with [facility] this year.   Referral to gynecology placed> [facility] [phone]

## 2022-07-01 NOTE — Progress Notes (Signed)
Subjective:   Felicia Hall is a 77 y.o. female who presents for Medicare Annual (Subsequent) preventive examination.  Review of Systems           Objective:    Today's Vitals   07/01/22 1013  BP: 122/60  Pulse: 69  Resp: 16  Temp: (!) 97.4 F (36.3 C)  SpO2: 98%  Weight: 171 lb 12.8 oz (77.9 kg)  Height: 5\' 6"  (1.676 m)   Body mass index is 27.73 kg/m.     07/01/2022   10:16 AM 06/17/2022   10:12 AM 03/11/2022    8:29 AM 05/21/2021    8:52 AM 12/03/2020    2:20 PM 06/12/2020   10:22 AM 01/31/2020   10:34 AM  Advanced Directives  Does Patient Have a Medical Advance Directive? Yes Yes Yes Yes Yes Yes Yes  Type of Paramedic of [place];Living will [place];Living will [place];Living will Healthcare Power of [place];Living will [place]  Does patient want to make changes to medical advance directive? No - Patient declined No - Patient declined No - Patient declined No - Patient declined No - Patient declined No - Patient declined No - Patient declined  Copy of [place] in Chart? Yes - validated most recent copy scanned in chart (See row information) Yes - validated most recent copy scanned in chart (See row information) Yes - validated most recent copy scanned in chart (See row information) Yes - validated most recent copy scanned in chart (See row information)  Yes - validated most recent copy scanned in chart (See row information)     Current Medications (verified) Outpatient Encounter Medications as of 07/01/2022  Medication Sig   aspirin EC 81 MG tablet Take 1 tablet (81 mg total) by mouth daily. Swallow whole.   calcium citrate-vitamin D (CITRACAL+D) 315-200 MG-UNIT per tablet Take 1 tablet by mouth 4 (four) times daily.   chlorpheniramine (CHLOR-TRIMETON) 4 MG tablet Take 4 mg by mouth daily.   cholecalciferol (VITAMIN D) 1000 UNITS  tablet Take 2,000 Units by mouth daily.    Dorzolamide HCl-Timolol Mal PF 2-0.5 % SOLN Place 1 drop into both eyes 2 (two) times daily.   ezetimibe (ZETIA) 10 MG tablet TAKE 1 TABLET BY MOUTH EVERY DAY   losartan (COZAAR) 100 MG tablet Take 1 tablet (100 mg total) by mouth daily.   metFORMIN (GLUCOPHAGE) 500 MG tablet Take 0.5 tablets (250 mg total) by mouth daily with breakfast.   metoprolol succinate (TOPROL-XL) 25 MG 24 hr tablet TAKE 1 TABLET (25 MG TOTAL) BY MOUTH DAILY.   pantoprazole (PROTONIX) 40 MG tablet TAKE 1 TABLET BY MOUTH EVERY DAY   rosuvastatin (CRESTOR) 10 MG tablet Take 1 tablet (10 mg total) by mouth daily.   RSV vaccine recomb adjuvanted (AREXVY) 120 MCG/0.5ML injection Inject into the muscle.   [DISCONTINUED] brinzolamide (AZOPT) 1 % ophthalmic suspension Place 1 drop into both eyes 2 (two) times daily.   No facility-administered encounter medications on file as of 07/01/2022.    Allergies (verified) Statins, Allopurinol, and Darvocet [propoxyphene n-acetaminophen]   History: Past Medical History:  Diagnosis Date   Acute upper respiratory infections of unspecified site    Allergic rhinitis due to pollen    Arthritis    Benign essential hypertension    Bursitis    Cervical spondylosis without myelopathy    Cervicalgia    Controlled type 2 diabetes mellitus  without complication, without long-term current use of insulin ([place]) 10/01/2019   Diaphragmatic hernia without mention of obstruction or gangrene    Disorder of bone and cartilage, unspecified    Diverticulosis of colon (without mention of hemorrhage)    Encounter for long-term (current) use of other medications    GERD (gastroesophageal reflux disease)    Glaucoma    High blood sugar    History of bone density study    History of colonoscopy    History of echocardiogram    History of hiatal hernia    History of Papanicolaou smear of cervix    Hyperlipidemia LDL goal < 100    Hypertension     Keratoconjunctivitis sicca, 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 Prudencio Burly   CHOLECYSTECTOMY  1993   Dr.Lindsey    DILATION AND CURETTAGE OF UTERUS     GALLBLADDER SURGERY     LUMBAR LAMINECTOMY/DECOMPRESSION MICRODISCECTOMY N/A 04/21/2017   Procedure: BILATERAL LUMBAR FOUR- LUMBAR FIVE AND LEFT LUMBAR FIVE- SACRAL ONE LAMINOTOMY, MEDIAL FACETECTOMY AND FORAMINOTOMIES;  Surgeon: Jovita Gamma, MD;  Location: [facility];  Service: Neurosurgery;  Laterality: N/A;   NASAL SEPTUM SURGERY     Family History  Problem Relation Age of Onset   Heart attack Mother    Breast cancer Mother    Heart failure Mother    Pancreatitis Father    Diabetes Brother    Heart disease Brother        By-pass surgery   Lupus Cousin    Social History   Socioeconomic History   Marital status: Single    Spouse name: Not on file   Number of children: Not on file   Years of education: Not on file   Highest education level: Not on file  Occupational History   Occupation: Herbalist  Tobacco Use   Smoking status: Never   Smokeless tobacco: Never  Vaping Use   Vaping Use: Never used  Substance and Sexual Activity   Alcohol use: Yes    Comment: occasionally   Drug use: No   Sexual activity: Not on file  Other Topics Concern   Not on file  Social History Narrative   Tobacco use, amount per day now: 0   Past tobacco use, amount per day: 0   How many years did you use tobacco: 0   Alcohol  use (drinks per week): 0   Diet:   Do you drink/eat things with caffeine: Yes   Marital status:   Single                               What year were you married?   Do you live in a house, apartment, assisted living, condo, trailer, etc.? House   Is it one or more stories? One   How many persons live in your home?  1   Do you have pets in your home?( please list) 0   Highest Level of education completed? College   Current or past profession: Herbalist.   Do you exercise?     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 2lbs weights 2-3 times week.   Do you have a living will? Yes    Do you have a DNR form?      No                             If not, do you want to discuss one? No   Do you have signed POA/HPOA forms?    No                    If so, please bring to you appointment      Do you have any difficulty bathing or dressing yourself? No   Do you have any difficulty preparing food or eating? No   Do you have any difficulty managing your medications? No   Do you have any difficulty managing your finances? No   Do you have any difficulty affording your medications? No   Social Determinants of Health   Financial Resource Strain: Low Risk  (09/12/2017)   Overall Financial Resource Strain (CARDIA)    Difficulty of Paying Living Expenses: Not hard at all  Food Insecurity: No Food Insecurity (09/12/2017)   Hunger Vital Sign    Worried About Running Out of Food in the Last Year: Never true    Ran Out of Food in the Last Year: Never true  Transportation Needs: No Transportation Needs (09/12/2017)   PRAPARE - Hydrologist (Medical): No    Lack of Transportation (Non-Medical): No  Physical Activity: Sufficiently Active (09/12/2017)   Exercise Vital Sign    Days of Exercise per Week: 7 days    Minutes of Exercise per Session: 40 min  Stress: Stress Concern Present (09/12/2017)   [place]    Feeling of Stress : Rather much  Social Connections: Moderately Isolated (09/12/2017)   Social Connection and Isolation Panel [NHANES]    Frequency of Communication with Friends and Family: Three times a week    Frequency of Social Gatherings with Friends and Family: Three times a week    Attends Religious Services: Never    Active Member of Clubs or Organizations: No    Attends Archivist Meetings: Never    Marital Status: Never married    Tobacco Counseling Counseling given: Not Answered   Clinical Intake:                 Diabetic?Yes         Activities of Daily Living     No data to display          Patient Care Team: Yvonna Alanis, NP as PCP - General (Adult Health Nurse Practitioner) Donato Heinz, MD as PCP - Cardiology (Cardiology) Katy Apo, MD as Consulting Physician (Ophthalmology)  Indicate any recent Medical Services you may have received from other than [facility] providers in the past year (date may be approximate).     Assessment:   This is a routine wellness examination  for [employer].  Hearing/Vision screen Hearing Screening - Comments:: Some hearing concerns in left ear. Vision Screening - Comments:: No vision concerns. Patient last eye exam 06/24/2022. Patient wears OTC reading glasses.   Dietary issues and exercise activities discussed:     Goals Addressed   None    Depression Screen    03/11/2022   10:39 AM 06/25/2021    9:29 AM 05/21/2021    9:30 AM 06/12/2020   10:28 AM 01/31/2020   10:34 AM 10/05/2019   11:07 AM 10/01/2019   10:15 AM  PHQ 2/9 Scores  PHQ - 2 Score 0 0 0 0 0 0 0    Fall Risk    07/01/2022   10:15 AM 06/17/2022   10:11 AM 03/11/2022   10:38 AM 12/31/2021    9:55 AM 06/25/2021    9:29 AM  Fall Risk   Falls in the past year? 0 0 0 0 1  Number falls in past yr: 0 0 0 0 0  Injury with Fall? 0 0 0 0 0  Risk for fall due to : No Fall Risks  No Fall Risks No Fall  Risks No Fall Risks  Follow up Falls evaluation completed Falls evaluation completed Falls evaluation completed  Falls evaluation completed    FALL RISK PREVENTION PERTAINING TO THE HOME:  Any stairs in or around the home? No  If so, are there any without handrails? No  Home free of loose throw rugs in walkways, pet beds, electrical cords, etc? Yes  Adequate lighting in your home to reduce risk of falls? Yes   ASSISTIVE DEVICES UTILIZED TO PREVENT FALLS:  Life alert? Yes  Use of a cane, walker or w/c? No  Grab bars in the bathroom? Yes  Shower chair or bench in shower? Yes  Elevated toilet seat or a handicapped toilet? Yes   TIMED UP AND GO:  Was the test performed? No .  Length of time to ambulate 10 feet: N/A sec.   Gait slow and steady without use of assistive device  Cognitive Function:    06/25/2021   10:03 AM 09/12/2017    1:07 PM 08/06/2016    9:34 AM 05/09/2015    9:21 AM  MMSE - Mini Mental State Exam  Orientation to time 5 5 5 5   Orientation to Place 5 5 5 5   Registration 3 3 3 3   Attention/ Calculation 5 5 5 5   Recall 3 3 2 3   Language- name 2 objects 2 2 2 2   Language- repeat 1 1 1 1   Language- follow 3 step command 3 3 3 3   Language- read & follow direction 1 1 1 1   Write a sentence 1 1 1 1   Copy design 1 1 1 1   Total score 30 30 29 30         10/05/2019   11:08 AM 09/20/2018    1:32 PM  6CIT Screen  What Year? 0 points 0 points  What month? 0 points 0 points  What time? 0 points 0 points  Count back from 20 0 points 0 points  Months in reverse 0 points 0 points  Repeat phrase 0 points 0 points  Total Score 0 points 0 points    Immunizations Immunization History  Administered Date(s) Administered   Fluad Quad(high Dose 65+) 01/31/2020   Influenza, High Dose Seasonal PF 01/03/2017, 03/16/2018, 02/01/2019, 02/10/2021   Influenza,inj,Quad PF,6+ Mos 12/27/2014, 01/15/2016   Influenza-Unspecified 01/10/2008, 02/15/2011, 01/20/2012   PFIZER  Comirnaty(Gray Top)Covid-19  Tri-Sucrose Vaccine 08/04/2020   PFIZER(Purple Top)SARS-COV-2 Vaccination 06/10/2019, 07/04/2019, 01/07/2020   Pfizer Covid-19 Vaccine Bivalent Booster 24yrs & up 01/15/2021, 12/29/2021   Pneumococcal Conjugate-13 11/01/2013   Pneumococcal Polysaccharide-23 09/14/2010, 09/12/2017   Tdap 05/23/2019   Zoster Recombinat (Shingrix) 04/07/2018, 09/28/2018   Zoster, Live 04/12/2005    TDAP status: Up to date  Flu Vaccine status: Up to date  Pneumococcal vaccine status: Up to date  Covid-19 vaccine status: Completed vaccines  Qualifies for Shingles Vaccine? Yes   Zostavax completed Yes   Shingrix Completed?: Yes  Screening Tests Health Maintenance  Topic Date Due   Diabetic kidney evaluation - Urine ACR  Never done   Medicare Annual Wellness (AWV)  10/04/2020   COVID-19 Vaccine (7 - 2023-24 season) 12/10/2022 (Originally 02/23/2022)   INFLUENZA VACCINE  12/24/2022 (Originally 11/10/2021)   OPHTHALMOLOGY EXAM  08/11/2022   HEMOGLOBIN A1C  12/29/2022   FOOT EXAM  01/01/2023   Diabetic kidney evaluation - eGFR measurement  06/28/2023   DTaP/Tdap/Td (2 - Td or Tdap) 05/22/2029   Pneumonia Vaccine 68+ Years old  Completed   DEXA SCAN  Completed   Hepatitis C Screening  Completed   Zoster Vaccines- Shingrix  Completed   HPV VACCINES  Aged Out   Fecal DNA (Cologuard)  Discontinued    Health Maintenance  Health Maintenance Due  Topic Date Due   Diabetic kidney evaluation - Urine ACR  Never done   Medicare Annual Wellness (AWV)  10/04/2020    Colorectal cancer screening: Type of screening: Cologuard. Completed 2022. Repeat every aged out years  Mammogram status: Completed 2021. Repeat every year  Bone Density status: Completed 2021. Results reflect: Bone density results: OSTEOPENIA. Repeat every 2 years.  Lung Cancer Screening: (Low Dose CT Chest recommended if Age 75-80 years, 30 pack-year currently smoking OR have quit w/in 15years.) does not  qualify.   Lung Cancer Screening Referral: No  Additional Screening:  Hepatitis C Screening: does not qualify; Completed   Vision Screening: Recommended annual ophthalmology exams for early detection of glaucoma and other disorders of the eye. Is the patient up to date with their annual eye exam?  Yes  Who is the provider or what is the name of the office in which the patient attends annual eye exams? Dr. Prudencio Burly If pt is not established with a provider, would they like to be referred to a provider to establish care? No .   Dental Screening: Recommended annual dental exams for proper oral hygiene  Community Resource Referral / Chronic Care Management: CRR required this visit?  No   CCM required this visit?  No      Plan:     I have personally reviewed and noted the following in the patient's chart:   Medical and social history Use of alcohol, tobacco or illicit drugs  Current medications and supplements including opioid prescriptions. Patient is not currently taking opioid prescriptions. Functional ability and status Nutritional status Physical activity Advanced directives List of other physicians Hospitalizations, surgeries, and ER visits in previous 12 months Vitals Screenings to include cognitive, depression, and falls Referrals and appointments  In addition, I have reviewed and discussed with patient certain preventive protocols, quality metrics, and best practice recommendations. A written personalized care plan for preventive services as well as general preventive health recommendations were provided to patient.     Yvonna Alanis, NP   07/01/2022   Nurse Notes: Requested new OBGYN since current provider left practice. She plans to contact [facility] to  have mammogram and DEXA completed.

## 2022-07-06 DIAGNOSIS — M9903 Segmental and somatic dysfunction of lumbar region: Secondary | ICD-10-CM | POA: Diagnosis not present

## 2022-07-06 DIAGNOSIS — M9905 Segmental and somatic dysfunction of pelvic region: Secondary | ICD-10-CM | POA: Diagnosis not present

## 2022-07-06 DIAGNOSIS — M5136 Other intervertebral disc degeneration, lumbar region: Secondary | ICD-10-CM | POA: Diagnosis not present

## 2022-07-06 DIAGNOSIS — M9904 Segmental and somatic dysfunction of sacral region: Secondary | ICD-10-CM | POA: Diagnosis not present

## 2022-07-15 DIAGNOSIS — M9905 Segmental and somatic dysfunction of pelvic region: Secondary | ICD-10-CM | POA: Diagnosis not present

## 2022-07-15 DIAGNOSIS — M9904 Segmental and somatic dysfunction of sacral region: Secondary | ICD-10-CM | POA: Diagnosis not present

## 2022-07-15 DIAGNOSIS — M9903 Segmental and somatic dysfunction of lumbar region: Secondary | ICD-10-CM | POA: Diagnosis not present

## 2022-07-15 DIAGNOSIS — M5136 Other intervertebral disc degeneration, lumbar region: Secondary | ICD-10-CM | POA: Diagnosis not present

## 2022-07-26 DIAGNOSIS — M5136 Other intervertebral disc degeneration, lumbar region: Secondary | ICD-10-CM | POA: Diagnosis not present

## 2022-07-26 DIAGNOSIS — M9905 Segmental and somatic dysfunction of pelvic region: Secondary | ICD-10-CM | POA: Diagnosis not present

## 2022-07-26 DIAGNOSIS — M9903 Segmental and somatic dysfunction of lumbar region: Secondary | ICD-10-CM | POA: Diagnosis not present

## 2022-07-26 DIAGNOSIS — M9904 Segmental and somatic dysfunction of sacral region: Secondary | ICD-10-CM | POA: Diagnosis not present

## 2022-07-29 DIAGNOSIS — M5136 Other intervertebral disc degeneration, lumbar region: Secondary | ICD-10-CM | POA: Diagnosis not present

## 2022-07-29 DIAGNOSIS — M9903 Segmental and somatic dysfunction of lumbar region: Secondary | ICD-10-CM | POA: Diagnosis not present

## 2022-07-29 DIAGNOSIS — M9904 Segmental and somatic dysfunction of sacral region: Secondary | ICD-10-CM | POA: Diagnosis not present

## 2022-07-29 DIAGNOSIS — M9905 Segmental and somatic dysfunction of pelvic region: Secondary | ICD-10-CM | POA: Diagnosis not present

## 2022-08-08 ENCOUNTER — Other Ambulatory Visit: Payer: Self-pay | Admitting: Cardiology

## 2022-08-10 DIAGNOSIS — M5136 Other intervertebral disc degeneration, lumbar region: Secondary | ICD-10-CM | POA: Diagnosis not present

## 2022-08-10 DIAGNOSIS — M9903 Segmental and somatic dysfunction of lumbar region: Secondary | ICD-10-CM | POA: Diagnosis not present

## 2022-08-10 DIAGNOSIS — M9905 Segmental and somatic dysfunction of pelvic region: Secondary | ICD-10-CM | POA: Diagnosis not present

## 2022-08-10 DIAGNOSIS — M9904 Segmental and somatic dysfunction of sacral region: Secondary | ICD-10-CM | POA: Diagnosis not present

## 2022-08-23 DIAGNOSIS — M5136 Other intervertebral disc degeneration, lumbar region: Secondary | ICD-10-CM | POA: Diagnosis not present

## 2022-08-23 DIAGNOSIS — M9903 Segmental and somatic dysfunction of lumbar region: Secondary | ICD-10-CM | POA: Diagnosis not present

## 2022-08-23 DIAGNOSIS — M9904 Segmental and somatic dysfunction of sacral region: Secondary | ICD-10-CM | POA: Diagnosis not present

## 2022-08-23 DIAGNOSIS — M9905 Segmental and somatic dysfunction of pelvic region: Secondary | ICD-10-CM | POA: Diagnosis not present

## 2022-09-01 DIAGNOSIS — M9904 Segmental and somatic dysfunction of sacral region: Secondary | ICD-10-CM | POA: Diagnosis not present

## 2022-09-01 DIAGNOSIS — M5136 Other intervertebral disc degeneration, lumbar region: Secondary | ICD-10-CM | POA: Diagnosis not present

## 2022-09-01 DIAGNOSIS — M9905 Segmental and somatic dysfunction of pelvic region: Secondary | ICD-10-CM | POA: Diagnosis not present

## 2022-09-01 DIAGNOSIS — M9903 Segmental and somatic dysfunction of lumbar region: Secondary | ICD-10-CM | POA: Diagnosis not present

## 2022-09-09 DIAGNOSIS — M5136 Other intervertebral disc degeneration, lumbar region: Secondary | ICD-10-CM | POA: Diagnosis not present

## 2022-09-09 DIAGNOSIS — M9905 Segmental and somatic dysfunction of pelvic region: Secondary | ICD-10-CM | POA: Diagnosis not present

## 2022-09-09 DIAGNOSIS — M9904 Segmental and somatic dysfunction of sacral region: Secondary | ICD-10-CM | POA: Diagnosis not present

## 2022-09-09 DIAGNOSIS — M9903 Segmental and somatic dysfunction of lumbar region: Secondary | ICD-10-CM | POA: Diagnosis not present

## 2022-09-21 DIAGNOSIS — M9905 Segmental and somatic dysfunction of pelvic region: Secondary | ICD-10-CM | POA: Diagnosis not present

## 2022-09-21 DIAGNOSIS — M9903 Segmental and somatic dysfunction of lumbar region: Secondary | ICD-10-CM | POA: Diagnosis not present

## 2022-09-21 DIAGNOSIS — M9904 Segmental and somatic dysfunction of sacral region: Secondary | ICD-10-CM | POA: Diagnosis not present

## 2022-09-21 DIAGNOSIS — M5136 Other intervertebral disc degeneration, lumbar region: Secondary | ICD-10-CM | POA: Diagnosis not present

## 2022-09-25 ENCOUNTER — Other Ambulatory Visit: Payer: Self-pay | Admitting: Orthopedic Surgery

## 2022-09-25 DIAGNOSIS — K219 Gastro-esophageal reflux disease without esophagitis: Secondary | ICD-10-CM

## 2022-09-26 NOTE — Progress Notes (Unsigned)
Cardiology Office Note:    Date:  09/27/2022  ID:  Felicia Hall, DOB 11-26-1945, MRN [id number]  PCP:  Octavia Heir, NP  Cardiologist:  [name], MD  Electrophysiologist:  None   Referring MD: Octavia Heir, NP   Chief Complaint  Patient presents with   Coronary Artery Disease    History of Present Illness:    Felicia Hall is a 77 y.o. female with a hx of T2DM, GERD, hyperlipidemia, hypertension who presents for follow-up.  She was referred by Dr. Renato Gails for evaluation of palpitations on 10/24/2019.   She reports that palpitations started in May 2021.  She was watching TV and had an episode where she felt her heart was racing.  Lasted for few minutes and resolved.  She denies any lightheadedness or syncope during this episode.  Then earlier in July 2021 had an episode where she felt like her heart was fluttering, lasted few seconds and resolved.  She reports that she exercises by riding stationary bicycle every day for 30 minutes.  She denies any chest pain or dyspnea.  She denies any lower extremity edema.  No smoking history.  Rare alcohol use.  She drinks one latte every morning, no other caffeine throughout the day.  Brother had CABG in 15s.  She has had issues with myalgias on statins in the past.    Zio patch x14 days on 11/21/2019 showed no significant arrhythmias.  Calcium score on 8/2/221 was 463 (86 percentile).  Echo 02/04/2020 showed LVEF 70 to 75%, normal RV function, grade 1 diastolic dysfunction, mild MR, recorded as severe pulmonary artery systolic pressure elevation but pulmonary pressures appear normal.   Lexiscan Myoview on 02/04/2020 showed normal perfusion, EF 76%.  Echo 04/30/2021 showed normal biventricular function, no significant valvular disease.  Zio patch x 14 days on 05/22/2021 showed no significant arrhythmias.  Stress PET on 03/16/2022 showed normal perfusion, myocardial blood flow reserve 3.39, EF 56%.  Since last clinic visit, she reports that she is  doing well.  States that she has had no further palpitations.  No episodes of lightheadedness or syncope.  She denies any chest pain, dyspnea, or lower extremity edema.  Has been using the stationary bike every day for 30 minutes.   Past Medical History:  Diagnosis Date   Acute upper respiratory infections of unspecified site    Allergic rhinitis due to pollen    Arthritis    Benign essential hypertension    Bursitis    Cervical spondylosis without myelopathy    Cervicalgia    Controlled type 2 diabetes mellitus without complication, without long-term current use of insulin (HCC) 10/01/2019   Diaphragmatic hernia without mention of obstruction or gangrene    Disorder of bone and cartilage, unspecified    Diverticulosis of colon (without mention of hemorrhage)    Encounter for long-term (current) use of other medications    GERD (gastroesophageal reflux disease)    Glaucoma    High blood sugar    History of bone density study    History of colonoscopy    History of echocardiogram    History of hiatal hernia    History of Papanicolaou smear of cervix    Hyperlipidemia LDL goal < 100    Hypertension    Keratoconjunctivitis sicca, 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's    Other and unspecified hyperlipidemia    Pain in joint, site unspecified    Pre-diabetes    Routine general medical examination at a health care  facility    Sinusitis    Sjogren's syndrome (HCC) 10/27/2012   Tear film insufficiency, unspecified    Unspecified disorder of kidney and ureter    Unspecified glaucoma(365.9)     Past Surgical History:  Procedure Laterality Date   BACK SURGERY     BELPHAROPTOSIS REPAIR     brow lift   bil   CATARACT EXTRACTION W/ INTRAOCULAR LENS  IMPLANT, BILATERAL Bilateral 12/2014   Dr. Randon Goldsmith   CHOLECYSTECTOMY  1993   Dr.Lindsey    DILATION AND CURETTAGE OF UTERUS     GALLBLADDER SURGERY     LUMBAR LAMINECTOMY/DECOMPRESSION MICRODISCECTOMY N/A 04/21/2017   Procedure: BILATERAL LUMBAR  FOUR- LUMBAR FIVE AND LEFT LUMBAR FIVE- SACRAL ONE LAMINOTOMY, MEDIAL FACETECTOMY AND FORAMINOTOMIES;  Surgeon: Shirlean Kelly, MD;  Location: [facility] OR;  Service: Neurosurgery;  Laterality: N/A;   NASAL SEPTUM SURGERY      Current Medications: Current Meds  Medication Sig   aspirin EC 81 MG tablet Take 1 tablet (81 mg total) by mouth daily. Swallow whole.   calcium citrate-vitamin D (CITRACAL+D) 315-200 MG-UNIT per tablet Take 1 tablet by mouth 4 (four) times daily.   chlorpheniramine (CHLOR-TRIMETON) 4 MG tablet Take 4 mg by mouth daily.   cholecalciferol (VITAMIN D) 1000 UNITS tablet Take 2,000 Units by mouth daily.    Dorzolamide HCl-Timolol Mal PF 2-0.5 % SOLN Place 1 drop into both eyes 2 (two) times daily.   ezetimibe (ZETIA) 10 MG tablet TAKE 1 TABLET BY MOUTH EVERY DAY   losartan (COZAAR) 100 MG tablet Take 1 tablet (100 mg total) by mouth daily.   metFORMIN (GLUCOPHAGE) 500 MG tablet Take 0.5 tablets (250 mg total) by mouth daily with breakfast.   metoprolol succinate (TOPROL-XL) 25 MG 24 hr tablet TAKE 1 TABLET (25 MG TOTAL) BY MOUTH DAILY.   pantoprazole (PROTONIX) 40 MG tablet TAKE 1 TABLET BY MOUTH EVERY DAY   rosuvastatin (CRESTOR) 10 MG tablet Take 1 tablet (10 mg total) by mouth daily.     Allergies:   Statins, Allopurinol, and Darvocet [propoxyphene n-acetaminophen]   Social History   Socioeconomic History   Marital status: Single    Spouse name: Not on file   Number of children: Not on file   Years of education: Not on file   Highest education level: Bachelor's degree (e.g., BA, AB, BS)  Occupational History   Occupation: Librarian, academic  Tobacco Use   Smoking status: Never   Smokeless tobacco: Never  Vaping Use   Vaping Use: Never used  Substance and Sexual Activity   Alcohol use: Yes    Comment: occasionally   Drug use: No   Sexual activity: Not on file  Other Topics Concern   Not on file  Social History Narrative   Tobacco use, amount per day now: 0    Past tobacco use, amount per day: 0   How many years did you use tobacco: 0   Alcohol use (drinks per week): 0   Diet:   Do you drink/eat things with caffeine: Yes   Marital status:   Single                               What year were you married?   Do you live in a house, apartment, assisted living, condo, trailer, etc.? House   Is it one or more stories? One   How many persons live in your home? 1  Do you have pets in your home?( please list) 0   Highest Level of education completed? College   Current or past profession: Librarian, academic.   Do you exercise?     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 2lbs weights 2-3 times week.   Do you have a living will? Yes    Do you have a DNR form?      No                             If not, do you want to discuss one? No   Do you have signed POA/HPOA forms?    No                    If so, please bring to you appointment      Do you have any difficulty bathing or dressing yourself? No   Do you have any difficulty preparing food or eating? No   Do you have any difficulty managing your medications? No   Do you have any difficulty managing your finances? No   Do you have any difficulty affording your medications? No   Social Determinants of Health   Financial Resource Strain: Low Risk  (09/26/2022)   Overall Financial Resource Strain (CARDIA)    Difficulty of Paying Living Expenses: Not hard at all  Food Insecurity: No Food Insecurity (09/26/2022)   Hunger Vital Sign    Worried About Running Out of Food in the Last Year: Never true    Ran Out of Food in the Last Year: Never true  Transportation Needs: No Transportation Needs (09/26/2022)   PRAPARE - Administrator, Civil Service (Medical): No    Lack of Transportation (Non-Medical): No  Physical Activity: Inactive (09/26/2022)   Exercise Vital Sign    Days of Exercise per Week: 0 days    Minutes of Exercise per Session: 30 min  Stress: No  Stress Concern Present (09/26/2022)   [employer] of Occupational Health - Occupational Stress Questionnaire    Feeling of Stress : Only a little  Recent Concern: Stress - Stress Concern Present (07/01/2022)   [employer] of Occupational Health - Occupational Stress Questionnaire    Feeling of Stress : To some extent  Social Connections: Socially Isolated (09/26/2022)   Social Connection and Isolation Panel [NHANES]    Frequency of Communication with Friends and Family: Never    Frequency of Social Gatherings with Friends and Family: Never    Attends Religious Services: Never    Diplomatic Services operational officer: No    Attends Engineer, structural: Never    Marital Status: Never married     Family History: The patient's family history includes Breast cancer in her mother; Diabetes in her brother; Heart attack in her mother; Heart disease in her brother; Heart failure in her mother; Lupus in her cousin; Pancreatitis in her father.  ROS:   Please see the history of present illness.     All other systems reviewed and are negative.  EKGs/Labs/Other Studies Reviewed:    The following studies were reviewed today:   EKG:  09/27/2022: Normal sinus rhythm, first-degree AV block, rate 67, no ST abnormality  Recent Labs: 11/17/2021: Magnesium 2.1 06/28/2022: ALT 18; BUN  13; Creat 0.74; Hemoglobin 14.9; Platelets 254; Potassium 4.7; Sodium 133; TSH 3.47  Recent Lipid Panel    Component Value Date/Time   CHOL 137 06/28/2022 0812   CHOL 115 12/18/2020 0813   TRIG 166 (H) 06/28/2022 0812   HDL 49 (L) 06/28/2022 0812   HDL 45 12/18/2020 0813   CHOLHDL 2.8 06/28/2022 0812   VLDL 36 (H) 08/06/2016 0825   LDLCALC 64 06/28/2022 0812    Physical Exam:    VS:  BP 120/82 (BP Location: Left Arm, Patient Position: Sitting, Cuff Size: Normal)   Pulse 67   Ht 5' 6.5" (1.689 m)   Wt 170 lb 9.6 oz (77.4 kg)   SpO2 97%   BMI 27.12 kg/m     Wt Readings from Last 3  Encounters:  09/27/22 170 lb 9.6 oz (77.4 kg)  07/01/22 171 lb 12.8 oz (77.9 kg)  06/17/22 172 lb 3.2 oz (78.1 kg)     GEN: Well nourished, well developed in no acute distress HEENT: Normal NECK: No JVD; No carotid bruits LYMPHATICS: No lymphadenopathy CARDIAC: RRR, no murmurs, rubs, gallops RESPIRATORY:  Clear to auscultation without rales, wheezing or rhonchi  ABDOMEN: Soft, non-tender, non-distended MUSCULOSKELETAL:  No edema; No deformity  SKIN: Warm and dry NEUROLOGIC:  Alert and oriented x 3 PSYCHIATRIC:  Normal affect   ASSESSMENT:    1. Coronary artery disease involving native coronary artery of native heart without angina pectoris   2. Essential hypertension   3. Hyperlipidemia, unspecified hyperlipidemia type   4. Syncope and collapse      PLAN:     CAD: Calcium score on 8/2/221 was 463 (86 percentile).  She reported atypical lower chest/epigastric pain.  Echo 02/04/2020 showed LVEF 70 to 75%, normal RV function, grade 1 diastolic dysfunction, mild MR, recorded as severe pulmonary artery systolic pressure elevation but pulmonary pressures appear normal.  Lexiscan Myoview on 02/04/2020 showed normal perfusion, EF 76%.  Stress PET on 03/16/2022 showed normal perfusion, myocardial blood flow reserve 3.39, EF 56%. -Continue aspirin 81 mg daily -LDL 76 on 01/29/2020, rosuvastatin increased to 20 mg daily but developed myalgias and was reduced to 10 mg daily.  Zetia 10 mg daily added.  LDL 56 on 12/28/2021  Syncope: Unclear cause.  She reports she thinks she was dehydrated.  Echo 04/30/2021 showed normal biventricular function, no significant valvular disease.  Zio patch x 14 days on 05/22/2021 showed no significant arrhythmias. No recent episodes  Palpitations: Zio patch x14 days on 11/21/2019 showed no significant arrhythmias.  Zio patch x 14 days on 05/22/2021 showed no significant arrhythmias.  She reports palpitations have resolved since starting Toprol-XL, will  continue  Hyperlipidemia: LDL 76 on 01/29/2020, rosuvastatin increased to 20 mg daily but developed myalgias and was reduced to 10 mg daily.  Zetia 10 mg daily added.  LDL 56 on 12/28/2021  Hypertension: On losartan 100 mg daily and Toprol-XL 25 mg daily.   T2DM: A1c 6.5% on 06/28/2022.  On metformin  RTC in 1 year   Medication Adjustments/Labs and Tests Ordered: Current medicines are reviewed at length with the patient today.  Concerns regarding medicines are outlined above.  No orders of the defined types were placed in this encounter.  No orders of the defined types were placed in this encounter.   Patient Instructions  Medication Instructions:  Continue same medications *If you need a refill on your cardiac medications before your next appointment, please call your pharmacy*   Lab Work: None ordered  Testing/Procedures: None ordered   Follow-Up: At [facility], you and your health needs are our priority.  As part of our continuing mission to provide you with exceptional heart care, we have created designated Provider Care Teams.  These Care Teams include your primary Cardiologist (physician) and Advanced Practice Providers (APPs -  Physician Assistants and Nurse Practitioners) who all work together to provide you with the care you need, when you need it.  We recommend signing up for the patient portal called "MyChart".  Sign up information is provided on this After Visit Summary.  MyChart is used to connect with patients for Virtual Visits (Telemedicine).  Patients are able to view lab/test results, encounter notes, upcoming appointments, etc.  Non-urgent messages can be sent to your provider as well.   To learn more about what you can do with MyChart, go to [email or web address].    Your next appointment:  1 year     Call in Feb to schedule June appointment     Provider:  Dr.Yuji Walth     Signed, [name], MD  09/27/2022 10:15 AM    [facility]

## 2022-09-27 ENCOUNTER — Ambulatory Visit: Payer: Medicare Other | Attending: Cardiology | Admitting: Cardiology

## 2022-09-27 ENCOUNTER — Encounter: Payer: Self-pay | Admitting: Cardiology

## 2022-09-27 VITALS — BP 120/82 | HR 67 | Ht 66.5 in | Wt 170.6 lb

## 2022-09-27 DIAGNOSIS — E785 Hyperlipidemia, unspecified: Secondary | ICD-10-CM

## 2022-09-27 DIAGNOSIS — I10 Essential (primary) hypertension: Secondary | ICD-10-CM

## 2022-09-27 DIAGNOSIS — I2510 Atherosclerotic heart disease of native coronary artery without angina pectoris: Secondary | ICD-10-CM

## 2022-09-27 DIAGNOSIS — R55 Syncope and collapse: Secondary | ICD-10-CM

## 2022-09-27 NOTE — Patient Instructions (Signed)
Medication Instructions:  Continue same medications *If you need a refill on your cardiac medications before your next appointment, please call your pharmacy*   Lab Work: None ordered   Testing/Procedures: None ordered   Follow-Up: At [facility], you and your health needs are our priority.  As part of our continuing mission to provide you with exceptional heart care, we have created designated Provider Care Teams.  These Care Teams include your primary Cardiologist (physician) and Advanced Practice Providers (APPs -  Physician Assistants and Nurse Practitioners) who all work together to provide you with the care you need, when you need it.  We recommend signing up for the patient portal called "MyChart".  Sign up information is provided on this After Visit Summary.  MyChart is used to connect with patients for Virtual Visits (Telemedicine).  Patients are able to view lab/test results, encounter notes, upcoming appointments, etc.  Non-urgent messages can be sent to your provider as well.   To learn more about what you can do with MyChart, go to [email or web address].    Your next appointment:  1 year     Call in Feb to schedule June appointment     Provider:  Dr.Schumann

## 2022-09-30 ENCOUNTER — Encounter: Payer: Self-pay | Admitting: Orthopedic Surgery

## 2022-09-30 ENCOUNTER — Ambulatory Visit (INDEPENDENT_AMBULATORY_CARE_PROVIDER_SITE_OTHER): Payer: Medicare Other | Admitting: Orthopedic Surgery

## 2022-09-30 VITALS — BP 122/88 | HR 70 | Temp 97.3°F | Resp 16 | Ht 66.5 in | Wt 170.4 lb

## 2022-09-30 DIAGNOSIS — M9904 Segmental and somatic dysfunction of sacral region: Secondary | ICD-10-CM | POA: Diagnosis not present

## 2022-09-30 DIAGNOSIS — H9312 Tinnitus, left ear: Secondary | ICD-10-CM | POA: Diagnosis not present

## 2022-09-30 DIAGNOSIS — H6122 Impacted cerumen, left ear: Secondary | ICD-10-CM | POA: Diagnosis not present

## 2022-09-30 DIAGNOSIS — M9905 Segmental and somatic dysfunction of pelvic region: Secondary | ICD-10-CM | POA: Diagnosis not present

## 2022-09-30 DIAGNOSIS — E1165 Type 2 diabetes mellitus with hyperglycemia: Secondary | ICD-10-CM | POA: Diagnosis not present

## 2022-09-30 DIAGNOSIS — M5136 Other intervertebral disc degeneration, lumbar region: Secondary | ICD-10-CM | POA: Diagnosis not present

## 2022-09-30 DIAGNOSIS — M9903 Segmental and somatic dysfunction of lumbar region: Secondary | ICD-10-CM | POA: Diagnosis not present

## 2022-09-30 LAB — BASIC METABOLIC PANEL WITH GFR
BUN: 11 mg/dL (ref 7–25)
CO2: 28 mmol/L (ref 20–32)
Calcium: 10.2 mg/dL (ref 8.6–10.4)
Chloride: 97 mmol/L — ABNORMAL LOW (ref 98–110)
Creat: 0.76 mg/dL (ref 0.60–1.00)
Glucose, Bld: 101 mg/dL — ABNORMAL HIGH (ref 65–99)
Potassium: 4.7 mmol/L (ref 3.5–5.3)
Sodium: 132 mmol/L — ABNORMAL LOW (ref 135–146)
eGFR: 81 mL/min/{1.73_m2} (ref >=60)

## 2022-09-30 LAB — HEMOGLOBIN A1C
Hgb A1c MFr Bld: 6.2 % of total Hgb — ABNORMAL HIGH (ref <5.7)
Mean Plasma Glucose: 131 mg/dL
eAG (mmol/L): 7.3 mmol/L

## 2022-09-30 NOTE — Patient Instructions (Signed)
Recommend Debrox- 5 gtts to left ear every night x 5 days. Flush ears in shower when debrox complete.   May schedule ear flush in office.   Try Xyzal every night x 2 weeks for " hissing"

## 2022-09-30 NOTE — Progress Notes (Signed)
Careteam: Patient Care Team: Octavia Heir, NP as PCP - General (Adult Health Nurse Practitioner) [name], MD as PCP - Cardiology (Cardiology) Antony Contras, MD as Consulting Physician (Ophthalmology)  Seen by: Hazle Nordmann, AGNP-C  PLACE OF SERVICE:  [facility]  Advanced Directive information    Allergies  Allergen Reactions   Statins Other (See Comments)    Muscle problems   Allopurinol Rash   Darvocet [Propoxyphene N-Acetaminophen] Rash    Chief Complaint  Patient presents with   Medical Management of Chronic Issues    3 month follow up.    Health Maintenance    Discuss the need for Eye exam.      HPI: Patient is a 77 y.o. female seen today for medical management of chronic conditions.   A1c 6.5. She is taking metformin as prescribed. Denies SE. Does not want glucometer.  Recently saw cardiology, no changes to medications. Advised to return in 1 year.   Left ear ringing, described as "hiss." Cerumen impaction today. Discussed Debrox. She does not take antihistamine regularly.    Review of Systems:  Review of Systems  Constitutional: Negative.   HENT:  Positive for tinnitus.   Eyes: Negative.   Respiratory: Negative.    Cardiovascular: Negative.   Genitourinary: Negative.   Musculoskeletal: Negative.   Skin: Negative.   Neurological: Negative.   Endo/Heme/Allergies:  Negative for polydipsia.  Psychiatric/Behavioral: Negative.      Past Medical History:  Diagnosis Date   Acute upper respiratory infections of unspecified site    Allergic rhinitis due to pollen    Arthritis    Benign essential hypertension    Bursitis    Cervical spondylosis without myelopathy    Cervicalgia    Controlled type 2 diabetes mellitus without complication, without long-term current use of insulin (HCC) 10/01/2019   Diaphragmatic hernia without mention of obstruction or gangrene    Disorder of bone and cartilage, unspecified    Diverticulosis of colon  (without mention of hemorrhage)    Encounter for long-term (current) use of other medications    GERD (gastroesophageal reflux disease)    Glaucoma    High blood sugar    History of bone density study    History of colonoscopy    History of echocardiogram    History of hiatal hernia    History of Papanicolaou smear of cervix    Hyperlipidemia LDL goal < 100    Hypertension    Keratoconjunctivitis sicca, 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 Randon Goldsmith   CHOLECYSTECTOMY  1993   Dr.Lindsey    DILATION AND CURETTAGE OF UTERUS     GALLBLADDER SURGERY     LUMBAR LAMINECTOMY/DECOMPRESSION MICRODISCECTOMY N/A 04/21/2017   Procedure: BILATERAL LUMBAR FOUR- LUMBAR FIVE AND LEFT LUMBAR FIVE- SACRAL ONE LAMINOTOMY, MEDIAL FACETECTOMY AND FORAMINOTOMIES;  Surgeon: Shirlean Kelly, MD;  Location: [facility] OR;  Service: Neurosurgery;  Laterality: N/A;   NASAL SEPTUM SURGERY     Social History:   reports that she has never smoked. She has never used smokeless tobacco. She reports current alcohol use. She reports that she does not use drugs.  Family History  Problem Relation Age of Onset   Heart attack Mother    Breast cancer Mother    Heart failure Mother    Pancreatitis Father    Diabetes Brother    Heart disease Brother        By-pass surgery   Lupus Cousin     Medications: Patient's Medications  New Prescriptions   No medications on file  Previous Medications   ASPIRIN EC 81 MG  TABLET    Take 1 tablet (81 mg total) by mouth daily. Swallow whole.   CALCIUM CITRATE-VITAMIN D (CITRACAL+D) 315-200 MG-UNIT PER TABLET    Take 1 tablet by mouth 4 (four) times daily.   CHLORPHENIRAMINE (CHLOR-TRIMETON) 4 MG TABLET    Take 4 mg by mouth daily.   CHOLECALCIFEROL (VITAMIN D) 1000 UNITS TABLET    Take 2,000 Units by mouth daily.    DORZOLAMIDE HCL-TIMOLOL MAL PF 2-0.5 % SOLN    Place 1 drop into both eyes 2 (two) times daily.   EZETIMIBE (ZETIA) 10 MG TABLET    TAKE 1 TABLET BY MOUTH EVERY DAY   LOSARTAN (COZAAR) 100 MG TABLET    Take 1 tablet (100 mg total) by mouth daily.   METFORMIN (GLUCOPHAGE) 500 MG TABLET    Take 0.5 tablets (250 mg total) by mouth daily with breakfast.   METOPROLOL SUCCINATE (TOPROL-XL) 25 MG 24 HR TABLET    TAKE 1 TABLET (25 MG TOTAL) BY MOUTH DAILY.   PANTOPRAZOLE (PROTONIX) 40 MG TABLET    TAKE 1 TABLET BY MOUTH EVERY DAY   ROSUVASTATIN (CRESTOR) 10 MG TABLET    Take 1 tablet (10 mg total) by mouth daily.   RSV VACCINE RECOMB ADJUVANTED (AREXVY) 120 MCG/0.5ML INJECTION    Inject into the muscle.  Modified Medications   No medications on file  Discontinued Medications   No medications on file    Physical Exam:  There were no vitals filed for this visit. There is no height or weight on file to calculate BMI. Wt Readings from Last 3 Encounters:  09/27/22 170 lb 9.6 oz (77.4 kg)  07/01/22 171 lb 12.8 oz (77.9 kg)  06/17/22 172 lb 3.2 oz (78.1 kg)    Physical Exam Vitals reviewed.  Constitutional:      General: She is not in acute distress. HENT:     Head: Normocephalic.     Right Ear: There is impacted cerumen.     Left Ear: There is impacted cerumen.     Mouth/Throat:     Mouth: Mucous membranes are moist.  Eyes:     General:        Right eye: No discharge.        Left eye: No discharge.  Cardiovascular:     Rate and Rhythm: Normal rate and regular rhythm.     Pulses: Normal pulses.     Heart sounds: Normal heart sounds.   Pulmonary:     Effort: Pulmonary effort is normal.     Breath sounds: Normal breath sounds.  Abdominal:     General: Bowel sounds are normal.     Palpations: Abdomen is soft.  Musculoskeletal:     Cervical back: Neck supple.     Right lower leg: No edema.  Left lower leg: No edema.  Skin:    General: Skin is warm.     Capillary Refill: Capillary refill takes less than 2 seconds.  Neurological:     General: No focal deficit present.     Mental Status: She is alert and oriented to person, place, and time.  Psychiatric:        Mood and Affect: Mood normal.        Behavior: Behavior normal.     Labs reviewed: Basic Metabolic Panel: Recent Labs    11/17/21 0853 12/28/21 0814 03/26/22 1140 04/26/22 0828 06/28/22 0812  NA 135   < > 132* 134 133*  K 4.6   < > 4.6 4.6 4.7  CL 97   < > 97 97 99  CO2 25   < > 24 25 26   GLUCOSE 96   < > 122* 73 111*  BUN 12   < > 15 12 13   CREATININE 0.74   < > 0.75 0.68 0.74  CALCIUM 9.9   < > 9.9 9.9 9.8  MG 2.1  --   --   --   --   TSH  --   --   --   --  3.47   < > = values in this interval not displayed.   Liver Function Tests: Recent Labs    12/28/21 0814 06/28/22 0812  AST 16 19  ALT 14 18  BILITOT 0.9 1.0  PROT 6.7 7.0   No results for input(s): "LIPASE", "AMYLASE" in the last 8760 hours. No results for input(s): "AMMONIA" in the last 8760 hours. CBC: Recent Labs    12/28/21 0814 06/28/22 0812  WBC 7.8 7.4  NEUTROABS 4,727 4,144  HGB 14.8 14.9  HCT 43.7 44.6  MCV 89.4 91.4  PLT 225 254   Lipid Panel: Recent Labs    12/28/21 0814 06/28/22 0812  CHOL 122 137  HDL 45* 49*  LDLCALC 56 64  TRIG 131 166*  CHOLHDL 2.7 2.8   TSH: Recent Labs    06/28/22 0812  TSH 3.47   A1C: Lab Results  Component Value Date   HGBA1C 6.5 (H) 06/28/2022     Assessment/Plan 1. Type 2 diabetes mellitus with hyperglycemia, without long-term current use of insulin (HCC) - A1c 6.5> was 5.9 - started metformin 250 mg  daily> no SE - does not want to check sugars - will recheck A1c today - Basic Metabolic Panel with eGFR - Hemoglobin A1c  2. Tinnitus of left ear - left ear "hissing" recommend trial of Xyzal 5 mg po at bedtime x 2 weeks  3. Left ear impacted cerumen - start Debrox 5gtts- apply to left ear at bedtime x 5 days - flush ears in shower when debrox complete - recommend ear lavage if no improvement  Total time: 31 minutes. Greater than 50% of total time spent doing patient education regarding health maintenance, T2DM, tinnitus, left ear impaction, including symptom/medication management.    Next appt: 03/03/2023  Hazle Nordmann, Juel Burrow  [facility] [phone]

## 2022-10-07 DIAGNOSIS — M9903 Segmental and somatic dysfunction of lumbar region: Secondary | ICD-10-CM | POA: Diagnosis not present

## 2022-10-07 DIAGNOSIS — M5136 Other intervertebral disc degeneration, lumbar region: Secondary | ICD-10-CM | POA: Diagnosis not present

## 2022-10-07 DIAGNOSIS — M9905 Segmental and somatic dysfunction of pelvic region: Secondary | ICD-10-CM | POA: Diagnosis not present

## 2022-10-07 DIAGNOSIS — M9904 Segmental and somatic dysfunction of sacral region: Secondary | ICD-10-CM | POA: Diagnosis not present

## 2022-10-18 ENCOUNTER — Other Ambulatory Visit: Payer: Self-pay | Admitting: Orthopedic Surgery

## 2022-10-18 ENCOUNTER — Telehealth: Payer: Self-pay | Admitting: Orthopedic Surgery

## 2022-10-18 DIAGNOSIS — M5136 Other intervertebral disc degeneration, lumbar region: Secondary | ICD-10-CM | POA: Diagnosis not present

## 2022-10-18 DIAGNOSIS — H9312 Tinnitus, left ear: Secondary | ICD-10-CM

## 2022-10-18 DIAGNOSIS — M9904 Segmental and somatic dysfunction of sacral region: Secondary | ICD-10-CM | POA: Diagnosis not present

## 2022-10-18 DIAGNOSIS — M9903 Segmental and somatic dysfunction of lumbar region: Secondary | ICD-10-CM | POA: Diagnosis not present

## 2022-10-18 DIAGNOSIS — M9905 Segmental and somatic dysfunction of pelvic region: Secondary | ICD-10-CM | POA: Diagnosis not present

## 2022-10-18 NOTE — Telephone Encounter (Signed)
Patient called and would like a referral to ENT as she is still having issues. She said she spoke to you at her last office visit about this.

## 2022-10-27 DIAGNOSIS — M9904 Segmental and somatic dysfunction of sacral region: Secondary | ICD-10-CM | POA: Diagnosis not present

## 2022-10-27 DIAGNOSIS — M9903 Segmental and somatic dysfunction of lumbar region: Secondary | ICD-10-CM | POA: Diagnosis not present

## 2022-10-27 DIAGNOSIS — M5136 Other intervertebral disc degeneration, lumbar region: Secondary | ICD-10-CM | POA: Diagnosis not present

## 2022-10-27 DIAGNOSIS — M9905 Segmental and somatic dysfunction of pelvic region: Secondary | ICD-10-CM | POA: Diagnosis not present

## 2022-11-04 DIAGNOSIS — M9904 Segmental and somatic dysfunction of sacral region: Secondary | ICD-10-CM | POA: Diagnosis not present

## 2022-11-04 DIAGNOSIS — M9905 Segmental and somatic dysfunction of pelvic region: Secondary | ICD-10-CM | POA: Diagnosis not present

## 2022-11-04 DIAGNOSIS — M9903 Segmental and somatic dysfunction of lumbar region: Secondary | ICD-10-CM | POA: Diagnosis not present

## 2022-11-04 DIAGNOSIS — M5136 Other intervertebral disc degeneration, lumbar region: Secondary | ICD-10-CM | POA: Diagnosis not present

## 2022-11-15 DIAGNOSIS — M9903 Segmental and somatic dysfunction of lumbar region: Secondary | ICD-10-CM | POA: Diagnosis not present

## 2022-11-15 DIAGNOSIS — M9905 Segmental and somatic dysfunction of pelvic region: Secondary | ICD-10-CM | POA: Diagnosis not present

## 2022-11-15 DIAGNOSIS — M9904 Segmental and somatic dysfunction of sacral region: Secondary | ICD-10-CM | POA: Diagnosis not present

## 2022-11-15 DIAGNOSIS — M5136 Other intervertebral disc degeneration, lumbar region: Secondary | ICD-10-CM | POA: Diagnosis not present

## 2022-11-24 DIAGNOSIS — M9905 Segmental and somatic dysfunction of pelvic region: Secondary | ICD-10-CM | POA: Diagnosis not present

## 2022-11-24 DIAGNOSIS — M9904 Segmental and somatic dysfunction of sacral region: Secondary | ICD-10-CM | POA: Diagnosis not present

## 2022-11-24 DIAGNOSIS — M5136 Other intervertebral disc degeneration, lumbar region: Secondary | ICD-10-CM | POA: Diagnosis not present

## 2022-11-24 DIAGNOSIS — M9903 Segmental and somatic dysfunction of lumbar region: Secondary | ICD-10-CM | POA: Diagnosis not present

## 2022-11-29 ENCOUNTER — Other Ambulatory Visit (HOSPITAL_BASED_OUTPATIENT_CLINIC_OR_DEPARTMENT_OTHER): Payer: Self-pay

## 2022-11-30 DIAGNOSIS — M5136 Other intervertebral disc degeneration, lumbar region: Secondary | ICD-10-CM | POA: Diagnosis not present

## 2022-11-30 DIAGNOSIS — M9905 Segmental and somatic dysfunction of pelvic region: Secondary | ICD-10-CM | POA: Diagnosis not present

## 2022-11-30 DIAGNOSIS — M9903 Segmental and somatic dysfunction of lumbar region: Secondary | ICD-10-CM | POA: Diagnosis not present

## 2022-11-30 DIAGNOSIS — M9904 Segmental and somatic dysfunction of sacral region: Secondary | ICD-10-CM | POA: Diagnosis not present

## 2022-12-03 DIAGNOSIS — H6122 Impacted cerumen, left ear: Secondary | ICD-10-CM | POA: Diagnosis not present

## 2022-12-07 DIAGNOSIS — H9312 Tinnitus, left ear: Secondary | ICD-10-CM | POA: Diagnosis not present

## 2022-12-07 DIAGNOSIS — H903 Sensorineural hearing loss, bilateral: Secondary | ICD-10-CM | POA: Diagnosis not present

## 2022-12-08 ENCOUNTER — Other Ambulatory Visit: Payer: Self-pay | Admitting: Physician Assistant

## 2022-12-08 DIAGNOSIS — H903 Sensorineural hearing loss, bilateral: Secondary | ICD-10-CM

## 2022-12-08 DIAGNOSIS — H9312 Tinnitus, left ear: Secondary | ICD-10-CM

## 2022-12-09 DIAGNOSIS — M9904 Segmental and somatic dysfunction of sacral region: Secondary | ICD-10-CM | POA: Diagnosis not present

## 2022-12-09 DIAGNOSIS — M9905 Segmental and somatic dysfunction of pelvic region: Secondary | ICD-10-CM | POA: Diagnosis not present

## 2022-12-09 DIAGNOSIS — M9903 Segmental and somatic dysfunction of lumbar region: Secondary | ICD-10-CM | POA: Diagnosis not present

## 2022-12-09 DIAGNOSIS — M5136 Other intervertebral disc degeneration, lumbar region: Secondary | ICD-10-CM | POA: Diagnosis not present

## 2022-12-20 DIAGNOSIS — M9902 Segmental and somatic dysfunction of thoracic region: Secondary | ICD-10-CM | POA: Diagnosis not present

## 2022-12-20 DIAGNOSIS — M5134 Other intervertebral disc degeneration, thoracic region: Secondary | ICD-10-CM | POA: Diagnosis not present

## 2022-12-29 DIAGNOSIS — Z1231 Encounter for screening mammogram for malignant neoplasm of breast: Secondary | ICD-10-CM | POA: Diagnosis not present

## 2022-12-29 LAB — HM MAMMOGRAPHY

## 2022-12-30 DIAGNOSIS — M5134 Other intervertebral disc degeneration, thoracic region: Secondary | ICD-10-CM | POA: Diagnosis not present

## 2022-12-30 DIAGNOSIS — M9902 Segmental and somatic dysfunction of thoracic region: Secondary | ICD-10-CM | POA: Diagnosis not present

## 2023-01-05 ENCOUNTER — Ambulatory Visit: Payer: Medicare Other | Admitting: Obstetrics and Gynecology

## 2023-01-05 DIAGNOSIS — H04123 Dry eye syndrome of bilateral lacrimal glands: Secondary | ICD-10-CM | POA: Diagnosis not present

## 2023-01-05 DIAGNOSIS — H401132 Primary open-angle glaucoma, bilateral, moderate stage: Secondary | ICD-10-CM | POA: Diagnosis not present

## 2023-01-05 DIAGNOSIS — H1789 Other corneal scars and opacities: Secondary | ICD-10-CM | POA: Diagnosis not present

## 2023-01-05 DIAGNOSIS — Z961 Presence of intraocular lens: Secondary | ICD-10-CM | POA: Diagnosis not present

## 2023-01-07 ENCOUNTER — Ambulatory Visit: Payer: Medicare Other | Admitting: Obstetrics and Gynecology

## 2023-01-10 DIAGNOSIS — M5134 Other intervertebral disc degeneration, thoracic region: Secondary | ICD-10-CM | POA: Diagnosis not present

## 2023-01-10 DIAGNOSIS — M9902 Segmental and somatic dysfunction of thoracic region: Secondary | ICD-10-CM | POA: Diagnosis not present

## 2023-01-17 DIAGNOSIS — M8588 Other specified disorders of bone density and structure, other site: Secondary | ICD-10-CM | POA: Diagnosis not present

## 2023-01-17 DIAGNOSIS — Z8262 Family history of osteoporosis: Secondary | ICD-10-CM | POA: Diagnosis not present

## 2023-01-17 DIAGNOSIS — R29890 Loss of height: Secondary | ICD-10-CM | POA: Diagnosis not present

## 2023-01-19 DIAGNOSIS — M9902 Segmental and somatic dysfunction of thoracic region: Secondary | ICD-10-CM | POA: Diagnosis not present

## 2023-01-19 DIAGNOSIS — M5134 Other intervertebral disc degeneration, thoracic region: Secondary | ICD-10-CM | POA: Diagnosis not present

## 2023-01-20 ENCOUNTER — Other Ambulatory Visit: Payer: Self-pay | Admitting: Cardiology

## 2023-01-20 ENCOUNTER — Ambulatory Visit: Payer: Medicare Other | Admitting: Obstetrics and Gynecology

## 2023-01-20 ENCOUNTER — Encounter: Payer: Self-pay | Admitting: Obstetrics and Gynecology

## 2023-01-20 VITALS — BP 127/85 | HR 76

## 2023-01-20 DIAGNOSIS — N3281 Overactive bladder: Secondary | ICD-10-CM

## 2023-01-20 DIAGNOSIS — N393 Stress incontinence (female) (male): Secondary | ICD-10-CM

## 2023-01-20 NOTE — Patient Instructions (Addendum)
Today we talked about ways to manage bladder urgency such as altering your diet to avoid irritative beverages and foods (bladder diet) as well as attempting to decrease stress and other exacerbating factors.    The Most Bothersome Foods* The Least Bothersome Foods*  Coffee - Regular & Decaf Tea - caffeinated Carbonated beverages - cola, non-colas, diet & caffeine-free Alcohols - Beer, Red Wine, White Wine, [address] - Grapefruit, [place], Orange, [employer] - Cranberry, Grapefruit, Orange, Pineapple Vegetables - Tomato & Tomato Products Flavor Enhancers - Hot peppers, Spicy foods, Chili, Horseradish, Vinegar, Monosodium glutamate (MSG) Artificial Sweeteners - NutraSweet, Sweet 'N Low, Equal (sweetener), Saccharin Ethnic foods - [place], [place], [place] food [employer] - low-fat & whole Fruits - Bananas, Blueberries, Honey[place]w melon, Pears, Raisins, Watermelon Vegetables - Broccoli, [address] Sprouts, [place], Carrots, Cauliflower, [place], Cucumber, Mushrooms, Peas, Radishes, Squash, Zucchini, White potatoes, Sweet potatoes & yams Poultry - Chicken, Eggs, [place], [employer] - Beef, Diplomatic Services operational officer, Lamb Seafood - Shrimp, [place] fish, Salmon Grains - Oat, Rice Snacks - Pretzels, Popcorn  *Lenward Chancellor et al. Diet and its role in interstitial cystitis/bladder pain syndrome (IC/BPS) and comorbid conditions. [employer]. BJU Int. 2012 Jan 11.   You were fit a pessary. You are electing to leave it in place until your follow up visit. If it falls out you can try to put it back in (with the knob facing outwards) or you can leave it out. If it seems to be slipping down you can use your finger to push it back in deeper - the deeper it is, the better fit.

## 2023-01-20 NOTE — Progress Notes (Signed)
[facility] Return Visit  SUBJECTIVE  History of Present Illness: HAWA HENLY is a 77 y.o. female seen in follow-up for overactive bladder and SUI.  Was previously doing pelvic exercises which has helped leakage in the past. She leaks more often on the way to the bathroom, each time she goes. She is voiding 2-3 times an hour sometimes. At night, she wakes up 2-3 times at night. Also has some leakage with cough or sneeze occasionally.   Drinks: 1 cup half decaf coffee in AM, water throughout the day, 2 glasses decaf iced tea- has cut back, sugar free vitamin water (natural sweetner)  With the trospium, it made her mouth too dry and she discontinued. Previously took [place]. Also tried PTNS for 5 treatments.   Past Medical History: Patient  has a past medical history of Acute upper respiratory infections of unspecified site, Allergic rhinitis due to pollen, Arthritis, Benign essential hypertension, Bursitis, Cervical spondylosis without myelopathy, Cervicalgia, Controlled type 2 diabetes mellitus without complication, without long-term current use of insulin (HCC) (10/01/2019), Diaphragmatic hernia without mention of obstruction or gangrene, Disorder of bone and cartilage, unspecified, Diverticulosis of colon (without mention of hemorrhage), Encounter for long-term (current) use of other medications, GERD (gastroesophageal reflux disease), Glaucoma, High blood sugar, History of bone density study, History of colonoscopy, History of echocardiogram, History of hiatal hernia, History of Papanicolaou smear of cervix, Hyperlipidemia LDL goal < 100, Hypertension, Keratoconjunctivitis sicca, 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's, Other and unspecified hyperlipidemia, Pain in joint, site unspecified, Pre-diabetes, Routine general medical examination at a health care facility, Sinusitis, Sjogren's syndrome (HCC) (10/27/2012), Tear film insufficiency, unspecified, Unspecified disorder of kidney and ureter,  and Unspecified glaucoma(365.9).   Past Surgical History: She  has a past surgical history that includes Cholecystectomy (1993); Cataract extraction w/ intraocular lens  implant, bilateral (Bilateral, 12/2014); Nasal septum surgery; Blepharoptosis repair; Dilation and curettage of uterus; Lumbar laminectomy/decompression microdiscectomy (N/A, 04/21/2017); Gallbladder surgery; and Back surgery.   Medications: She has a current medication list which includes the following prescription(s): aspirin ec, calcium citrate-vitamin d, chlorpheniramine, cholecalciferol, ezetimibe, losartan, metformin, metoprolol succinate, pantoprazole, and rosuvastatin.   Allergies: Patient is allergic to statins, allopurinol, and darvocet [propoxyphene n-acetaminophen].   Social History: Patient  reports that she has never smoked. She has never used smokeless tobacco. She reports current alcohol use. She reports that she does not use drugs.      OBJECTIVE     Physical Exam: Vitals:   01/20/23 0959  BP: 127/85  Pulse: 76    Gen: No apparent distress, A&O x 3.  Detailed Urogynecologic Evaluation:  Normal external genitalia. She was fit with a #0 flexible ring with knob pessary. It was comfortable, fit well, and stayed in placed with strong cough, valsalva and bending.     ASSESSMENT AND PLAN    Ms. Mccaffery is a 77 y.o. with:  1. SUI (stress urinary incontinence, female)   2. Overactive bladder     - Reviewed options of for OAB- botox, SNM and pelvic PT. She is not interested in Botox. Previously reviewed information for SNM and is not sure she is interested at this time. Has tried [place] and Trospium without success. On metoprolol so would not recommend myrbetriq, and would not recommend other anticholinergics due to age.  - reviewed decreasing bladder irritants.  - For SUI, discussed options of pelvic PT, pessary, urethral bulking or sling. She wants to try something not invasive so elected for a  pessary and a #0 incontinence  ring was placed today.   Return 2 weeks for pessary follow up with NP   Marguerita Beards, MD  Time spent: I spent 20 minutes dedicated to the care of this patient on the date of this encounter to include pre-visit review of records, face-to-face time with the patient discussing options and post visit documentation in addition to pessary fitting.

## 2023-01-21 ENCOUNTER — Ambulatory Visit
Admission: RE | Admit: 2023-01-21 | Discharge: 2023-01-21 | Disposition: A | Discharge status: Home / Self Care | Payer: Medicare Other | Source: Ambulatory Visit | Attending: Physician Assistant | Admitting: Physician Assistant

## 2023-01-21 DIAGNOSIS — H9312 Tinnitus, left ear: Secondary | ICD-10-CM

## 2023-01-21 DIAGNOSIS — H903 Sensorineural hearing loss, bilateral: Secondary | ICD-10-CM | POA: Diagnosis not present

## 2023-01-21 MED ORDER — GADOPICLENOL 0.5 MMOL/ML IV SOLN
7.0000 mL | Freq: Once | INTRAVENOUS | Status: AC | PRN
  Administered 2023-01-21: 7 mL via INTRAVENOUS

## 2023-01-28 ENCOUNTER — Other Ambulatory Visit (HOSPITAL_BASED_OUTPATIENT_CLINIC_OR_DEPARTMENT_OTHER): Payer: Self-pay

## 2023-01-28 MED ORDER — COMIRNATY 30 MCG/0.3ML IM SUSY
0.3000 mL | PREFILLED_SYRINGE | Freq: Once | INTRAMUSCULAR | 0 refills | Status: AC
  Filled 2023-01-28: qty 0.3, 1d supply, fill #0

## 2023-01-31 DIAGNOSIS — M9902 Segmental and somatic dysfunction of thoracic region: Secondary | ICD-10-CM | POA: Diagnosis not present

## 2023-01-31 DIAGNOSIS — M5134 Other intervertebral disc degeneration, thoracic region: Secondary | ICD-10-CM | POA: Diagnosis not present

## 2023-02-04 ENCOUNTER — Ambulatory Visit: Payer: Medicare Other | Admitting: Obstetrics and Gynecology

## 2023-02-04 ENCOUNTER — Encounter: Payer: Self-pay | Admitting: Obstetrics and Gynecology

## 2023-02-04 VITALS — BP 117/56 | HR 73

## 2023-02-04 DIAGNOSIS — N393 Stress incontinence (female) (male): Secondary | ICD-10-CM

## 2023-02-04 NOTE — Progress Notes (Signed)
[facility]   Subjective:     Chief Complaint:  Chief Complaint  Patient presents with   Follow-up    SUSETTE DEMEDEIROS is a 77 y.o. female here today for a pessary check. Current one keeps falling out.     History of Present Illness: SHANEKWA MOLT is a 77 y.o. female with stress incontinence who presents for a pessary check. She is using a size #0 incontinence ring pessary. The pessary has been falling out and she reports it will not stay in place.   Past Medical History: Patient  has a past medical history of Acute upper respiratory infections of unspecified site, Allergic rhinitis due to pollen, Arthritis, Benign essential hypertension, Bursitis, Cervical spondylosis without myelopathy, Cervicalgia, Controlled type 2 diabetes mellitus without complication, without long-term current use of insulin (HCC) (10/01/2019), Diaphragmatic hernia without mention of obstruction or gangrene, Disorder of bone and cartilage, unspecified, Diverticulosis of colon (without mention of hemorrhage), Encounter for long-term (current) use of other medications, GERD (gastroesophageal reflux disease), Glaucoma, High blood sugar, History of bone density study, History of colonoscopy, History of echocardiogram, History of hiatal hernia, History of Papanicolaou smear of cervix, Hyperlipidemia LDL goal < 100, Hypertension, Keratoconjunctivitis sicca, 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's, Other and unspecified hyperlipidemia, Pain in joint, site unspecified, Pre-diabetes, Routine general medical examination at a health care facility, Sinusitis, Sjogren's syndrome (HCC) (10/27/2012), Tear film insufficiency, unspecified, Unspecified disorder of kidney and ureter, and Unspecified glaucoma(365.9).   Past Surgical History: She  has a past surgical history that includes Cholecystectomy (1993); Cataract extraction w/ intraocular lens  implant, bilateral (Bilateral, 12/2014); Nasal septum surgery; Blepharoptosis repair;  Dilation and curettage of uterus; Lumbar laminectomy/decompression microdiscectomy (N/A, 04/21/2017); Gallbladder surgery; and Back surgery.   Medications: She has a current medication list which includes the following prescription(s): aspirin ec, calcium citrate-vitamin d, chlorpheniramine, cholecalciferol, ezetimibe, losartan, metformin, metoprolol succinate, pantoprazole, and rosuvastatin.   Allergies: Patient is allergic to statins, allopurinol, and darvocet [propoxyphene n-acetaminophen].   Social History: Patient  reports that she has never smoked. She has never used smokeless tobacco. She reports current alcohol use. She reports that she does not use drugs.      Objective:    Physical Exam: BP (!) 117/56   Pulse 73  Gen: No apparent distress, A&O x 3. Detailed Urogynecologic Evaluation:  Pelvic Exam: Normal external female genitalia; Bartholin's and Skene's glands normal in appearance; urethral meatus normal in appearance, no urethral masses or discharge. The pessary was noted to be dislodged. It was replaced with a #1 Incontinence dish. Patient was able to show proper placement but had a difficult time with removal.     Assessment/Plan:    Assessment: Ms. Ziegler is a 77 y.o. with stress incontinence here for a pessary check. She is doing well.  Plan: She will keep the pessary in place until next visit. She will continue to use lubricant. She will follow-up in 6 weeks for a pessary check or sooner as needed.  All questions were answered.

## 2023-02-10 ENCOUNTER — Other Ambulatory Visit: Payer: Self-pay | Admitting: Cardiology

## 2023-02-10 DIAGNOSIS — M9902 Segmental and somatic dysfunction of thoracic region: Secondary | ICD-10-CM | POA: Diagnosis not present

## 2023-02-10 DIAGNOSIS — M5134 Other intervertebral disc degeneration, thoracic region: Secondary | ICD-10-CM | POA: Diagnosis not present

## 2023-02-21 DIAGNOSIS — M9902 Segmental and somatic dysfunction of thoracic region: Secondary | ICD-10-CM | POA: Diagnosis not present

## 2023-02-21 DIAGNOSIS — M5134 Other intervertebral disc degeneration, thoracic region: Secondary | ICD-10-CM | POA: Diagnosis not present

## 2023-03-03 ENCOUNTER — Encounter: Payer: Self-pay | Admitting: Orthopedic Surgery

## 2023-03-03 ENCOUNTER — Ambulatory Visit (INDEPENDENT_AMBULATORY_CARE_PROVIDER_SITE_OTHER): Payer: Medicare Other | Admitting: Orthopedic Surgery

## 2023-03-03 VITALS — BP 122/88 | HR 98 | Temp 96.9°F | Resp 16 | Ht 66.5 in | Wt 171.6 lb

## 2023-03-03 DIAGNOSIS — M7062 Trochanteric bursitis, left hip: Secondary | ICD-10-CM | POA: Diagnosis not present

## 2023-03-03 DIAGNOSIS — H9312 Tinnitus, left ear: Secondary | ICD-10-CM | POA: Diagnosis not present

## 2023-03-03 DIAGNOSIS — I2510 Atherosclerotic heart disease of native coronary artery without angina pectoris: Secondary | ICD-10-CM | POA: Diagnosis not present

## 2023-03-03 DIAGNOSIS — M5134 Other intervertebral disc degeneration, thoracic region: Secondary | ICD-10-CM | POA: Diagnosis not present

## 2023-03-03 DIAGNOSIS — E1169 Type 2 diabetes mellitus with other specified complication: Secondary | ICD-10-CM | POA: Diagnosis not present

## 2023-03-03 DIAGNOSIS — I10 Essential (primary) hypertension: Secondary | ICD-10-CM | POA: Diagnosis not present

## 2023-03-03 DIAGNOSIS — E1165 Type 2 diabetes mellitus with hyperglycemia: Secondary | ICD-10-CM

## 2023-03-03 DIAGNOSIS — E785 Hyperlipidemia, unspecified: Secondary | ICD-10-CM | POA: Diagnosis not present

## 2023-03-03 DIAGNOSIS — M9902 Segmental and somatic dysfunction of thoracic region: Secondary | ICD-10-CM | POA: Diagnosis not present

## 2023-03-03 NOTE — Progress Notes (Signed)
Careteam: Patient Care Team: Felicia Heir, NP as PCP - General (Adult Health Nurse Practitioner) Felicia Ishikawa, MD as PCP - Cardiology (Cardiology) Antony Contras, MD as Consulting Physician (Ophthalmology) [facility], [facility]  Seen by: Hazle Nordmann, AGNP-C  PLACE OF SERVICE:  [facility]  Advanced Directive information Does Patient Have a Medical Advance Directive?: Yes, Type of Advance Directive: Healthcare Power of [place];Living will, Does patient want to make changes to medical advance directive?: No - Patient declined  Allergies  Allergen Reactions   Statins Other (See Comments)    Muscle problems   Allopurinol Rash   Darvocet [Propoxyphene N-Acetaminophen] Rash    Chief Complaint  Patient presents with   Medical Management of Chronic Issues    5 month follow up.    Health Maintenance    Discuss the need for Eye exam.      HPI: Patient is a 77 y.o. female seen today for medical management of chronic conditions.   No health concerns today.   Not fasting for lab work.   Prediabetes- A1c 6.2, she has been taking metformin as prescribed, eye exam was done earlier this year, also on ARB and statin  Bp 122/88, remains on metoprolol and losartan  Ongoing tinnitus after seeing hearing specialist> hearing aids recommended but she does not want to purchase. She was also seen by ENT>  MRI brain negative for acute intracranial abnormality or auditory canal mass. She tried zyrtec and Xyzal without success.   Mild left groin pain began a few months ago. No recent falls or injuries. She is taking tylenol arthritis- 2 tablets prn.     Review of Systems:  Review of Systems  Constitutional: Negative.   HENT:  Positive for tinnitus.   Eyes: Negative.   Respiratory: Negative.    Cardiovascular: Negative.   Gastrointestinal: Negative.   Genitourinary: Negative.   Musculoskeletal:  Positive for joint pain. Negative for falls.  Skin: Negative.    Neurological: Negative.   Psychiatric/Behavioral: Negative.      Past Medical History:  Diagnosis Date   Acute upper respiratory infections of unspecified site    Allergic rhinitis due to pollen    Arthritis    Benign essential hypertension    Bursitis    Cervical spondylosis without myelopathy    Cervicalgia    Controlled type 2 diabetes mellitus without complication, without long-term current use of insulin (HCC) 10/01/2019   Diaphragmatic hernia without mention of obstruction or gangrene    Disorder of bone and cartilage, unspecified    Diverticulosis of colon (without mention of hemorrhage)    Encounter for long-term (current) use of other medications    GERD (gastroesophageal reflux disease)    Glaucoma    open angle   High blood sugar    History of bone density study    History of colonoscopy    History of echocardiogram    History of hiatal hernia    History of Papanicolaou smear of cervix    Hyperlipidemia LDL goal < 100    Hypertension    Keratoconjunctivitis sicca, 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's    Other and unspecified hyperlipidemia    Pain in joint, site unspecified    Pre-diabetes    Routine general medical examination at a health care facility    Sinusitis    Sjogren's syndrome (HCC) 10/27/2012   Tear film insufficiency, unspecified    Unspecified disorder of kidney and ureter    Unspecified glaucoma(365.9)    Past  Surgical History:  Procedure Laterality Date   BACK SURGERY     BELPHAROPTOSIS REPAIR     brow lift   bil   CATARACT EXTRACTION W/ INTRAOCULAR LENS  IMPLANT, BILATERAL Bilateral 12/2014   Dr. Randon Goldsmith   CHOLECYSTECTOMY  1993   Dr.Lindsey    DILATION AND CURETTAGE OF UTERUS     GALLBLADDER SURGERY     LUMBAR LAMINECTOMY/DECOMPRESSION MICRODISCECTOMY N/A 04/21/2017   Procedure: BILATERAL LUMBAR FOUR- LUMBAR FIVE AND LEFT LUMBAR FIVE- SACRAL ONE LAMINOTOMY, MEDIAL FACETECTOMY AND FORAMINOTOMIES;  Surgeon: Shirlean Kelly, MD;  Location: [facility] OR;   Service: Neurosurgery;  Laterality: N/A;   NASAL SEPTUM SURGERY     Social History:   reports that she has never smoked. She has never used smokeless tobacco. She reports current alcohol use. She reports that she does not use drugs.  Family History  Problem Relation Age of Onset   Heart attack Mother    Breast cancer Mother    Heart failure Mother    Pancreatitis Father    Diabetes Brother    Heart disease Brother        By-pass surgery   Lupus Cousin     Medications: Patient's Medications  New Prescriptions   No medications on file  Previous Medications   ASPIRIN EC 81 MG TABLET    Take 1 tablet (81 mg total) by mouth daily. Swallow whole.   CALCIUM CITRATE-VITAMIN D (CITRACAL+D) 315-200 MG-UNIT PER TABLET    Take 1 tablet by mouth 4 (four) times daily.   CHLORPHENIRAMINE (CHLOR-TRIMETON) 4 MG TABLET    Take 4 mg by mouth daily.   CHOLECALCIFEROL (VITAMIN D) 1000 UNITS TABLET    Take 2,000 Units by mouth daily.    EZETIMIBE (ZETIA) 10 MG TABLET    TAKE 1 TABLET BY MOUTH EVERY DAY   LOSARTAN (COZAAR) 100 MG TABLET    Take 1 tablet (100 mg total) by mouth daily.   METFORMIN (GLUCOPHAGE) 500 MG TABLET    Take 0.5 tablets (250 mg total) by mouth daily with breakfast.   METOPROLOL SUCCINATE (TOPROL-XL) 25 MG 24 HR TABLET    TAKE 1 TABLET (25 MG TOTAL) BY MOUTH DAILY.   PANTOPRAZOLE (PROTONIX) 40 MG TABLET    TAKE 1 TABLET BY MOUTH EVERY DAY   ROSUVASTATIN (CRESTOR) 10 MG TABLET    Take 1 tablet (10 mg total) by mouth daily.  Modified Medications   No medications on file  Discontinued Medications   No medications on file    Physical Exam:  Vitals:   03/03/23 0955  BP: 122/88  Pulse: 98  Resp: 16  Temp: (!) 96.9 F (36.1 C)  SpO2: 93%  Weight: 171 lb 9.6 oz (77.8 kg)  Height: 5' 6.5" (1.689 m)   Body mass index is 27.28 kg/m. Wt Readings from Last 3 Encounters:  03/03/23 171 lb 9.6 oz (77.8 kg)  09/30/22 170 lb 6.4 oz (77.3 kg)  09/27/22 170 lb 9.6 oz (77.4 kg)     Physical Exam Vitals reviewed.  Constitutional:      General: She is not in acute distress. HENT:     Head: Normocephalic.  Eyes:     General:        Right eye: No discharge.        Left eye: No discharge.  Cardiovascular:     Rate and Rhythm: Normal rate and regular rhythm.     Pulses: Normal pulses.     Heart sounds: Normal heart sounds.  Musculoskeletal:     Cervical back: Neck supple.     Right lower leg: No edema.     Left lower leg: No edema.  Skin:    General: Skin is warm.     Capillary Refill: Capillary refill takes less than 2 seconds.  Neurological:     General: No focal deficit present.     Mental Status: She is alert.     Motor: No weakness.     Gait: Gait normal.  Psychiatric:        Mood and Affect: Mood normal.     Labs reviewed: Basic Metabolic Panel: Recent Labs    04/26/22 0828 06/28/22 0812 09/30/22 1033  NA 134 133* 132*  K 4.6 4.7 4.7  CL 97 99 97*  CO2 25 26 28   GLUCOSE 73 111* 101*  BUN 12 13 11   CREATININE 0.68 0.74 0.76  CALCIUM 9.9 9.8 10.2  TSH  --  3.47  --    Liver Function Tests: Recent Labs    06/28/22 0812  AST 19  ALT 18  BILITOT 1.0  PROT 7.0   No results for input(s): "LIPASE", "AMYLASE" in the last 8760 hours. No results for input(s): "AMMONIA" in the last 8760 hours. CBC: Recent Labs    06/28/22 0812  WBC 7.4  NEUTROABS 4,144  HGB 14.9  HCT 44.6  MCV 91.4  PLT 254   Lipid Panel: Recent Labs    06/28/22 0812  CHOL 137  HDL 49*  LDLCALC 64  TRIG 409*  CHOLHDL 2.8   TSH: Recent Labs    06/28/22 0812  TSH 3.47   A1C: Lab Results  Component Value Date   HGBA1C 6.2 (H) 09/30/2022     Assessment/Plan 1. Type 2 diabetes mellitus with hyperglycemia, without long-term current use of insulin (HCC) - A1c 6.2 (06/20) - on metformin, ARB and statin - recheck A1c today - last eye exam note requested - needs refill on metformin> will wait till A1c is rechecked - Hemoglobin A1c - Complete  Metabolic Panel with eGFR  2. Benign essential hypertension - controlled - cont losartan and metoprolol  3. Hyperlipidemia due to type 2 diabetes mellitus (HCC) - LDL 64 06/28/2022, at goal < 70 - cont statin  4. Coronary artery disease involving native coronary artery of native heart without angina pectoris - cont asa and statin  5. Tinnitus aurium, left - ongoing - evaluated by audiology and ENT - MRI brain unremarkable - unsuccessful trial antihistamines  6. Trochanteric bursitis of left hip - ongoing - having more groin pain now - gait normal - discussed seeing ortho> not interested  - cont tylenol arthritis prn  Total time: 32 minutes. Greater than 50% of total time spent doing patient education regarding health maintenance, T2DM, HTN, HLD, hip pain and tinnitus including symptom/medication management.      Next appt: 07/07/2023  Hazle Nordmann, Juel Burrow  [facility] [phone]

## 2023-03-04 ENCOUNTER — Other Ambulatory Visit: Payer: Self-pay | Admitting: Orthopedic Surgery

## 2023-03-04 DIAGNOSIS — E1165 Type 2 diabetes mellitus with hyperglycemia: Secondary | ICD-10-CM

## 2023-03-04 LAB — COMPLETE METABOLIC PANEL WITH GFR
AG Ratio: 1.7 (calc) (ref 1.0–2.5)
ALT: 20 U/L (ref 6–29)
AST: 19 U/L (ref 10–35)
Albumin: 4.3 g/dL (ref 3.6–5.1)
Alkaline phosphatase (APISO): 52 U/L (ref 37–153)
BUN: 12 mg/dL (ref 7–25)
CO2: 24 mmol/L (ref 20–32)
Calcium: 9.7 mg/dL (ref 8.6–10.4)
Chloride: 100 mmol/L (ref 98–110)
Creat: 0.7 mg/dL (ref 0.60–1.00)
Globulin: 2.5 g/dL (ref 1.9–3.7)
Glucose, Bld: 141 mg/dL — ABNORMAL HIGH (ref 65–139)
Potassium: 4.5 mmol/L (ref 3.5–5.3)
Sodium: 134 mmol/L — ABNORMAL LOW (ref 135–146)
Total Bilirubin: 0.7 mg/dL (ref 0.2–1.2)
Total Protein: 6.8 g/dL (ref 6.1–8.1)
eGFR: 89 mL/min/{1.73_m2} (ref >=60)

## 2023-03-04 LAB — HEMOGLOBIN A1C
Hgb A1c MFr Bld: 6.5 %{Hb} — ABNORMAL HIGH (ref <5.7)
Mean Plasma Glucose: 140 mg/dL
eAG (mmol/L): 7.7 mmol/L

## 2023-03-04 MED ORDER — METFORMIN HCL ER 500 MG PO TB24: 500.0000 mg | ORAL_TABLET | Freq: Every day | ORAL | 2 refills | Status: DC

## 2023-03-12 ENCOUNTER — Other Ambulatory Visit: Payer: Self-pay | Admitting: Cardiology

## 2023-03-14 DIAGNOSIS — M5134 Other intervertebral disc degeneration, thoracic region: Secondary | ICD-10-CM | POA: Diagnosis not present

## 2023-03-14 DIAGNOSIS — M9902 Segmental and somatic dysfunction of thoracic region: Secondary | ICD-10-CM | POA: Diagnosis not present

## 2023-03-18 ENCOUNTER — Encounter: Payer: Self-pay | Admitting: Obstetrics and Gynecology

## 2023-03-18 ENCOUNTER — Ambulatory Visit: Payer: Medicare Other | Admitting: Obstetrics and Gynecology

## 2023-03-18 VITALS — BP 141/83 | HR 67

## 2023-03-18 DIAGNOSIS — N393 Stress incontinence (female) (male): Secondary | ICD-10-CM

## 2023-03-18 DIAGNOSIS — N3281 Overactive bladder: Secondary | ICD-10-CM

## 2023-03-18 NOTE — Progress Notes (Signed)
[facility]   Subjective:     Chief Complaint:  Chief Complaint  Patient presents with   Pessary Check    Felicia Hall is a 77 y.o. female here for a pessary check. Pt said it is not helping with leaks.   History of Present Illness: Felicia Hall is a 77 y.o. female with stress incontinence and OAB who presents for a pessary check. She is using a size #1 incontinence dish pessary. The pessary has been working well and she has no complaints. She is not using vaginal estrogen. She denies vaginal bleeding.  Patient reports the pessary seemed to helped at first but has not helped much since the first 2 weeks. She reports she is not interested in having another pessary or this one replaced  Past Medical History: Patient  has a past medical history of Acute upper respiratory infections of unspecified site, Allergic rhinitis due to pollen, Arthritis, Benign essential hypertension, Bursitis, Cervical spondylosis without myelopathy, Cervicalgia, Controlled type 2 diabetes mellitus without complication, without long-term current use of insulin (HCC) (10/01/2019), Diaphragmatic hernia without mention of obstruction or gangrene, Disorder of bone and cartilage, unspecified, Diverticulosis of colon (without mention of hemorrhage), Encounter for long-term (current) use of other medications, GERD (gastroesophageal reflux disease), Glaucoma, High blood sugar, History of bone density study, History of colonoscopy, History of echocardiogram, History of hiatal hernia, History of Papanicolaou smear of cervix, Hyperlipidemia LDL goal < 100, Hypertension, Keratoconjunctivitis sicca, 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's, Other and unspecified hyperlipidemia, Pain in joint, site unspecified, Pre-diabetes, Routine general medical examination at a health care facility, Sinusitis, Sjogren's syndrome (HCC) (10/27/2012), Tear film insufficiency, unspecified, Unspecified disorder of kidney and ureter, and Unspecified  glaucoma(365.9).   Past Surgical History: She  has a past surgical history that includes Cholecystectomy (1993); Cataract extraction w/ intraocular lens  implant, bilateral (Bilateral, 12/2014); Nasal septum surgery; Blepharoptosis repair; Dilation and curettage of uterus; Lumbar laminectomy/decompression microdiscectomy (N/A, 04/21/2017); Gallbladder surgery; and Back surgery.   Medications: She has a current medication list which includes the following prescription(s): acetaminophen, aspirin ec, calcium citrate-vitamin d, chlorpheniramine, cholecalciferol, ezetimibe, losartan, metformin, metoprolol succinate, pantoprazole, and rosuvastatin.   Allergies: Patient is allergic to statins, allopurinol, and darvocet [propoxyphene n-acetaminophen].   Social History: Patient  reports that she has never smoked. She has never used smokeless tobacco. She reports current alcohol use. She reports that she does not use drugs.      Objective:    Physical Exam: BP (!) 141/83   Pulse 67  Gen: No apparent distress, A&O x 3. Detailed Urogynecologic Evaluation:  Pelvic Exam: Normal external female genitalia; Bartholin's and Skene's glands normal in appearance; urethral meatus normal in appearance, no urethral masses or discharge. The pessary was noted to be in place. It was removed and cleaned but not replaced at the patient's request.   Assessment/Plan:    Assessment: Felicia Hall is a 77 y.o. with stress incontinence and OAB here for a pessary check. She is not benefiting from the pessary.   Plan: She will consider options between urethral bulking and expectant management. Patient is very anxious about pain with the urethral bulking and is not interested in bladder botox or SNM at this time. Will plan to call patient in about a month to see her thoughts.

## 2023-03-18 NOTE — Patient Instructions (Signed)
Please consider doing the urethral bulking. I don't want you to feel pressured but also want you to know it is a good option.   We will plan to call you in about a month and see where you are at.

## 2023-03-22 ENCOUNTER — Ambulatory Visit: Payer: Medicare Other | Admitting: Family

## 2023-03-22 ENCOUNTER — Encounter: Payer: Self-pay | Admitting: Family

## 2023-03-22 ENCOUNTER — Other Ambulatory Visit (INDEPENDENT_AMBULATORY_CARE_PROVIDER_SITE_OTHER): Payer: Medicare Other

## 2023-03-22 DIAGNOSIS — M79671 Pain in right foot: Secondary | ICD-10-CM

## 2023-03-22 NOTE — Progress Notes (Signed)
Office Visit Note   Patient: ALAURA VANNOSTRAND           Date of Birth: [birth year]           MRN: [id number] Visit Date: 03/22/2023              Requested by: Octavia Heir, NP [phone] [address] [place],  [place] [address] PCP: Octavia Heir, NP  No chief complaint on file.     HPI: The patient is a 77 year old woman who complains of a weeks 37-month history of right foot and ankle pain the pain is along the lateral column and beneath the foot especially to the lateral aspect.  She has been having pain with weightbearing she complains of some grinding and popping which is audible to her in the foot and ankle with ambulation.  Has not had any injury or associated swelling Assessment & Plan: Visit Diagnoses:  1. Pain in right foot     Plan: Concern for stress fracture right fifth metatarsal.  Placed in a postop shoe will send for CT scan to further evaluate.  Follow-Up Instructions: No follow-ups on file.   Right Ankle Exam  Right ankle exam is normal.       Patient is alert, oriented, no adenopathy, well-dressed, normal affect, normal respiratory effort. On examination right foot she is exquisitely tender to the distal fifth metatarsal.  There is no ecchymosis or edema she does have a palpable dorsalis pedis pulse.  Imaging: No results found. No images are attached to the encounter.  Labs: Lab Results  Component Value Date   HGBA1C 6.5 (H) 03/03/2023   HGBA1C 6.2 (H) 09/30/2022   HGBA1C 6.5 (H) 06/28/2022   ESRSEDRATE 2 01/03/2017     Lab Results  Component Value Date   ALBUMIN 4.5 05/23/2016   ALBUMIN 4.4 09/10/2015   ALBUMIN 4.3 12/24/2014    Lab Results  Component Value Date   MG 2.1 11/17/2021   Lab Results  Component Value Date   VD25OH 36.2 04/26/2013   VD25OH 37.2 10/25/2012    No results found for: "PREALBUMIN"    Latest Ref Rng & Units 06/28/2022    8:12 AM 12/28/2021    8:14 AM 06/22/2021    8:07 AM  CBC EXTENDED  WBC 3.8 - 10.8 Thousand/uL 7.4   7.8  7.3   RBC 3.80 - 5.10 Million/uL 4.88  4.89  5.14   Hemoglobin 11.7 - 15.5 g/dL 46.9  62.9  52.8   HCT 35.0 - 45.0 % 44.6  43.7  47.6   Platelets 140 - 400 Thousand/uL 254  225  251   NEUT# 1,500 - 7,800 cells/uL 4,144  4,727  4,263   Lymph# 850 - 3,900 cells/uL 2,161  1,966  2,029      There is no height or weight on file to calculate BMI.  Orders:  Orders Placed This Encounter  Procedures   XR Foot Complete Right   No orders of the defined types were placed in this encounter.    Procedures: No procedures performed  Clinical Data: No additional findings.  ROS:  All other systems negative, except as noted in the HPI. Review of Systems  Objective: Vital Signs: There were no vitals taken for this visit.  Specialty Comments:  No specialty comments available.  PMFS History: Patient Active Problem List   Diagnosis Date Noted   Controlled type 2 diabetes mellitus without complication, without long-term current use of insulin (HCC) 10/01/2019   Trochanteric  bursitis 10/01/2019   Dizziness 10/01/2019   Palpitations 10/01/2019   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   Benign essential tremor 01/03/2017   Irritable bowel syndrome with constipation 08/09/2016   Neck pain, chronic 08/09/2016   Spinal stenosis in cervical region 08/09/2016   Generalized constriction of visual field 06/22/2016   Chronic pansinusitis 10/17/2015   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   Perennial allergic rhinitis 10/17/2015   Chronic pain syndrome 04/30/2013   Hyperglycemia 04/30/2013   Low back pain with right-sided sciatica 04/30/2013   Osteopenia, 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   Family history of colonic polyps 04/30/2013   Sjogren's syndrome (HCC) 10/27/2012   Keratoconjunctivitis sicca, 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's    Encounter for long-term (current) use of other medications    Disorder of bone and cartilage     Allergic rhinitis due to pollen    Cervical spondylosis without myelopathy    Unspecified glaucoma    Benign essential hypertension    Mixed hyperlipidemia    Past Medical History:  Diagnosis Date   Acute upper respiratory infections of unspecified site    Allergic rhinitis due to pollen    Arthritis    Benign essential hypertension    Bursitis    Cervical spondylosis without myelopathy    Cervicalgia    Controlled type 2 diabetes mellitus without complication, without long-term current use of insulin (HCC) 10/01/2019   Diaphragmatic hernia without mention of obstruction or gangrene    Disorder of bone and cartilage, unspecified    Diverticulosis of colon (without mention of hemorrhage)    Encounter for long-term (current) use of other medications    GERD (gastroesophageal reflux disease)    Glaucoma    open angle   High blood sugar    History of bone density study    History of colonoscopy    History of echocardiogram    History of hiatal hernia    History of Papanicolaou smear of cervix    Hyperlipidemia LDL goal < 100    Hypertension    Keratoconjunctivitis sicca, 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's    Other and unspecified hyperlipidemia    Pain in joint, site unspecified    Pre-diabetes    Routine general medical examination at a health care facility    Sinusitis    Sjogren's syndrome (HCC) 10/27/2012   Tear film insufficiency, unspecified    Unspecified disorder of kidney and ureter    Unspecified glaucoma(365.9)     Family History  Problem Relation Age of Onset   Heart attack Mother    Breast cancer Mother    Heart failure Mother    Pancreatitis Father    Diabetes Brother    Heart disease Brother        By-pass surgery   Lupus Cousin     Past Surgical History:  Procedure Laterality Date   BACK SURGERY     BELPHAROPTOSIS REPAIR     brow lift   bil   CATARACT EXTRACTION W/ INTRAOCULAR LENS  IMPLANT, BILATERAL Bilateral 12/2014   Dr. Randon Goldsmith   CHOLECYSTECTOMY   1993   Dr.Lindsey    DILATION AND CURETTAGE OF UTERUS     GALLBLADDER SURGERY     LUMBAR LAMINECTOMY/DECOMPRESSION MICRODISCECTOMY N/A 04/21/2017   Procedure: BILATERAL LUMBAR FOUR- LUMBAR FIVE AND LEFT LUMBAR FIVE- SACRAL ONE LAMINOTOMY, MEDIAL FACETECTOMY AND FORAMINOTOMIES;  Surgeon: Shirlean Kelly, MD;  Location: [facility] OR;  Service: Neurosurgery;  Laterality: N/A;   NASAL SEPTUM SURGERY  Social History   Occupational History   Occupation: Librarian, academic  Tobacco Use   Smoking status: Never   Smokeless tobacco: Never  Vaping Use   Vaping status: Never Used  Substance and Sexual Activity   Alcohol use: Yes    Comment: occasionally   Drug use: No   Sexual activity: Not on file

## 2023-03-24 DIAGNOSIS — M5134 Other intervertebral disc degeneration, thoracic region: Secondary | ICD-10-CM | POA: Diagnosis not present

## 2023-03-24 DIAGNOSIS — M9902 Segmental and somatic dysfunction of thoracic region: Secondary | ICD-10-CM | POA: Diagnosis not present

## 2023-03-25 ENCOUNTER — Other Ambulatory Visit: Payer: Self-pay | Admitting: Cardiology

## 2023-03-25 ENCOUNTER — Ambulatory Visit
Admission: RE | Admit: 2023-03-25 | Discharge: 2023-03-25 | Disposition: A | Discharge status: Home / Self Care | Payer: Medicare Other | Source: Ambulatory Visit | Attending: Family

## 2023-03-25 DIAGNOSIS — M79671 Pain in right foot: Secondary | ICD-10-CM

## 2023-03-29 ENCOUNTER — Telehealth: Payer: Self-pay | Admitting: Family

## 2023-03-30 ENCOUNTER — Ambulatory Visit: Payer: Medicare Other | Admitting: Family

## 2023-03-30 DIAGNOSIS — S93492A Sprain of other ligament of left ankle, initial encounter: Secondary | ICD-10-CM | POA: Diagnosis not present

## 2023-04-01 ENCOUNTER — Encounter: Payer: Self-pay | Admitting: Family

## 2023-04-01 NOTE — Progress Notes (Unsigned)
Office Visit Note   Patient: Felicia Hall           Date of Birth: 11/17/1945           MRN: [id number] Visit Date: 03/30/2023              Requested by: Octavia Heir, NP [phone] [address]. [address] [place],  [place] [address] PCP: Octavia Heir, NP  Chief Complaint  Patient presents with   Right Foot - Follow-up      HPI: ***  Assessment & Plan: Visit Diagnoses: No diagnosis found.  Plan: ***  Follow-Up Instructions: Return in about 2 weeks (around 04/13/2023), or if symptoms worsen or fail to improve.   Right Ankle Exam   Tenderness  The patient is experiencing tenderness in the ATF. Swelling: mild  Range of Motion  The patient has normal right ankle ROM.  Muscle Strength  The patient has normal right ankle strength.  Tests  Anterior drawer: negative       Patient is alert, oriented, no adenopathy, well-dressed, normal affect, normal respiratory effort.   Imaging: No results found. No images are attached to the encounter.  Labs: Lab Results  Component Value Date   HGBA1C 6.5 (H) 03/03/2023   HGBA1C 6.2 (H) 09/30/2022   HGBA1C 6.5 (H) 06/28/2022   ESRSEDRATE 2 01/03/2017     Lab Results  Component Value Date   ALBUMIN 4.5 05/23/2016   ALBUMIN 4.4 09/10/2015   ALBUMIN 4.3 12/24/2014    Lab Results  Component Value Date   MG 2.1 11/17/2021   Lab Results  Component Value Date   VD25OH 36.2 04/26/2013   VD25OH 37.2 10/25/2012    No results found for: "PREALBUMIN"    Latest Ref Rng & Units 06/28/2022    8:12 AM 12/28/2021    8:14 AM 06/22/2021    8:07 AM  CBC EXTENDED  WBC 3.8 - 10.8 Thousand/uL 7.4  7.8  7.3   RBC 3.80 - 5.10 Million/uL 4.88  4.89  5.14   Hemoglobin 11.7 - 15.5 g/dL 16.6  06.3  01.6   HCT 35.0 - 45.0 % 44.6  43.7  47.6   Platelets 140 - 400 Thousand/uL 254  225  251   NEUT# 1,500 - 7,800 cells/uL 4,144  4,727  4,263   Lymph# 850 - 3,900 cells/uL 2,161  1,966  2,029      There is no height or weight on file to calculate  BMI.  Orders:  No orders of the defined types were placed in this encounter.  No orders of the defined types were placed in this encounter.    Procedures: No procedures performed  Clinical Data: No additional findings.  ROS:  All other systems negative, except as noted in the HPI. Review of Systems  Objective: Vital Signs: There were no vitals taken for this visit.  Specialty Comments:  No specialty comments available.  PMFS History: Patient Active Problem List   Diagnosis Date Noted   Controlled type 2 diabetes mellitus without complication, without long-term current use of insulin (HCC) 10/01/2019   Trochanteric bursitis 10/01/2019   Dizziness 10/01/2019   Palpitations 10/01/2019   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   Benign essential tremor 01/03/2017   Irritable bowel syndrome with constipation 08/09/2016   Neck pain, chronic 08/09/2016   Spinal stenosis in cervical region 08/09/2016   Generalized constriction of visual field 06/22/2016   Chronic pansinusitis 10/17/2015  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   Perennial allergic rhinitis 10/17/2015   Chronic pain syndrome 04/30/2013   Hyperglycemia 04/30/2013   Low back pain with right-sided sciatica 04/30/2013   Osteopenia, 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   Family history of colonic polyps 04/30/2013   Sjogren's syndrome (HCC) 10/27/2012   Keratoconjunctivitis sicca, 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's    Encounter for long-term (current) use of other medications    Disorder of bone and cartilage    Allergic rhinitis due to pollen    Cervical spondylosis without myelopathy    Unspecified glaucoma    Benign essential hypertension    Mixed hyperlipidemia    Past Medical History:  Diagnosis Date   Acute upper respiratory infections of unspecified site    Allergic rhinitis due to pollen    Arthritis    Benign essential hypertension    Bursitis     Cervical spondylosis without myelopathy    Cervicalgia    Controlled type 2 diabetes mellitus without complication, without long-term current use of insulin (HCC) 10/01/2019   Diaphragmatic hernia without mention of obstruction or gangrene    Disorder of bone and cartilage, unspecified    Diverticulosis of colon (without mention of hemorrhage)    Encounter for long-term (current) use of other medications    GERD (gastroesophageal reflux disease)    Glaucoma    open angle   High blood sugar    History of bone density study    History of colonoscopy    History of echocardiogram    History of hiatal hernia    History of Papanicolaou smear of cervix    Hyperlipidemia LDL goal < 100    Hypertension    Keratoconjunctivitis sicca, 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's    Other and unspecified hyperlipidemia    Pain in joint, site unspecified    Pre-diabetes    Routine general medical examination at a health care facility    Sinusitis    Sjogren's syndrome (HCC) 10/27/2012   Tear film insufficiency, unspecified    Unspecified disorder of kidney and ureter    Unspecified glaucoma(365.9)     Family History  Problem Relation Age of Onset   Heart attack Mother    Breast cancer Mother    Heart failure Mother    Pancreatitis Father    Diabetes Brother    Heart disease Brother        By-pass surgery   Lupus Cousin     Past Surgical History:  Procedure Laterality Date   BACK SURGERY     BELPHAROPTOSIS REPAIR     brow lift   bil   CATARACT EXTRACTION W/ INTRAOCULAR LENS  IMPLANT, BILATERAL Bilateral 12/2014   Dr. Randon Goldsmith   CHOLECYSTECTOMY  1993   Dr.Lindsey    DILATION AND CURETTAGE OF UTERUS     GALLBLADDER SURGERY     LUMBAR LAMINECTOMY/DECOMPRESSION MICRODISCECTOMY N/A 04/21/2017   Procedure: BILATERAL LUMBAR FOUR- LUMBAR FIVE AND LEFT LUMBAR FIVE- SACRAL ONE LAMINOTOMY, MEDIAL FACETECTOMY AND FORAMINOTOMIES;  Surgeon: Shirlean Kelly, MD;  Location: [facility] OR;  Service: Neurosurgery;   Laterality: N/A;   NASAL SEPTUM SURGERY     Social History   Occupational History   Occupation: Librarian, academic  Tobacco Use   Smoking status: Never   Smokeless tobacco: Never  Vaping Use   Vaping status: Never Used  Substance and Sexual Activity   Alcohol use: Yes    Comment: occasionally   Drug use: No   Sexual activity: Not on file

## 2023-04-04 ENCOUNTER — Telehealth: Payer: Self-pay

## 2023-04-04 DIAGNOSIS — M9902 Segmental and somatic dysfunction of thoracic region: Secondary | ICD-10-CM | POA: Diagnosis not present

## 2023-04-04 DIAGNOSIS — M5134 Other intervertebral disc degeneration, thoracic region: Secondary | ICD-10-CM | POA: Diagnosis not present

## 2023-04-04 NOTE — Telephone Encounter (Signed)
See previous note

## 2023-04-12 DIAGNOSIS — M5134 Other intervertebral disc degeneration, thoracic region: Secondary | ICD-10-CM | POA: Diagnosis not present

## 2023-04-12 DIAGNOSIS — M9902 Segmental and somatic dysfunction of thoracic region: Secondary | ICD-10-CM | POA: Diagnosis not present

## 2023-04-19 DIAGNOSIS — M9904 Segmental and somatic dysfunction of sacral region: Secondary | ICD-10-CM | POA: Diagnosis not present

## 2023-04-19 DIAGNOSIS — M9905 Segmental and somatic dysfunction of pelvic region: Secondary | ICD-10-CM | POA: Diagnosis not present

## 2023-04-19 DIAGNOSIS — M5136 Other intervertebral disc degeneration, lumbar region with discogenic back pain only: Secondary | ICD-10-CM | POA: Diagnosis not present

## 2023-04-19 DIAGNOSIS — M9903 Segmental and somatic dysfunction of lumbar region: Secondary | ICD-10-CM | POA: Diagnosis not present

## 2023-04-20 ENCOUNTER — Encounter: Payer: Self-pay | Admitting: Family

## 2023-04-20 ENCOUNTER — Ambulatory Visit: Payer: Medicare HMO | Admitting: Family

## 2023-04-20 DIAGNOSIS — M79671 Pain in right foot: Secondary | ICD-10-CM

## 2023-04-20 MED ORDER — PREDNISONE 10 MG PO TABS: 10.0000 mg | ORAL_TABLET | Freq: Every day | ORAL | 0 refills | Status: DC

## 2023-04-20 NOTE — Progress Notes (Signed)
 Office Visit Note   Patient: Felicia Hall           Date of Birth: 28-Jul-1945           MRN: [id number] Visit Date: 04/20/2023              Requested by: Gil Greig BRAVO, NP [phone] [address] [place],  [place] [address] PCP: Gil Greig BRAVO, NP  Chief Complaint  Patient presents with   Right Foot - Pain      HPI: the patient is a 78 year old woman seen in follow-up for right foot pain.  The lateral ankle pain has resolved she is now having the majority of her pain beneath the lateral column distally with ambulation.  There is some tenderness but no pain at rest no swelling no redness  Assessment & Plan: Visit Diagnoses:  1. Pain in right foot     Plan: Lateral foot pain right.  Discussed supportive shoewear. we will trial her on a course of prednisone .  There was no bony abnormality on CT.  Follow-Up Instructions: Return in about 3 weeks (around 05/11/2023), or if symptoms worsen or fail to improve.   Right Ankle Exam  Right ankle exam is normal. Swelling: none  Muscle Strength  The patient has normal right ankle strength.       Patient is alert, oriented, no adenopathy, well-dressed, normal affect, normal respiratory effort. Mild tenderness to deep palpation along the fifth metatarsal distal one half.  There is no ecchymosis no edema no erythema  Imaging: No results found. No images are attached to the encounter.   IMPRESSION: 1. No evidence of acute fracture, dislocation or stress fracture. 2. Moderate degenerative changes at the 1st metatarsophalangeal joint and it sesamoids associated with a mild hallux valgus deformity. 3. Mild nonspecific subcutaneous edema surrounding the ankle and within the plantar aspects of the hindfoot and forefoot. No focal fluid collection, foreign body or soft tissue emphysema.    Labs: Lab Results  Component Value Date   HGBA1C 6.5 (H) 03/03/2023   HGBA1C 6.2 (H) 09/30/2022   HGBA1C 6.5 (H) 06/28/2022   ESRSEDRATE 2 01/03/2017      Lab Results  Component Value Date   ALBUMIN 4.5 05/23/2016   ALBUMIN 4.4 09/10/2015   ALBUMIN 4.3 12/24/2014    Lab Results  Component Value Date   MG 2.1 11/17/2021   Lab Results  Component Value Date   VD25OH 36.2 04/26/2013   VD25OH 37.2 10/25/2012    No results found for: PREALBUMIN    Latest Ref Rng & Units 06/28/2022    8:12 AM 12/28/2021    8:14 AM 06/22/2021    8:07 AM  CBC EXTENDED  WBC 3.8 - 10.8 Thousand/uL 7.4  7.8  7.3   RBC 3.80 - 5.10 Million/uL 4.88  4.89  5.14   Hemoglobin 11.7 - 15.5 g/dL 85.0  85.1  84.5   HCT 35.0 - 45.0 % 44.6  43.7  47.6   Platelets 140 - 400 Thousand/uL 254  225  251   NEUT# 1,500 - 7,800 cells/uL 4,144  4,727  4,263   Lymph# 850 - 3,900 cells/uL 2,161  1,966  2,029      There is no height or weight on file to calculate BMI.  Orders:  No orders of the defined types were placed in this encounter.  Meds ordered this encounter  Medications   predniSONE  (DELTASONE ) 10 MG tablet    Sig: Take 1 tablet (10 mg total)  by mouth daily with breakfast.    Dispense:  20 tablet    Refill:  0     Procedures: No procedures performed  Clinical Data: No additional findings.  ROS:  All other systems negative, except as noted in the HPI. Review of Systems  Objective: Vital Signs: There were no vitals taken for this visit.  Specialty Comments:  No specialty comments available.  PMFS History: Patient Active Problem List   Diagnosis Date Noted   Controlled type 2 diabetes mellitus without complication, without long-term current use of insulin  (HCC) 10/01/2019   Trochanteric bursitis 10/01/2019   Dizziness 10/01/2019   Palpitations 10/01/2019   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   Benign essential tremor 01/03/2017   Irritable bowel syndrome with constipation 08/09/2016   Neck pain, chronic 08/09/2016   Spinal stenosis in cervical region  08/09/2016   Generalized constriction of visual field 06/22/2016   Chronic pansinusitis 10/17/2015   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   Perennial allergic rhinitis 10/17/2015   Chronic pain syndrome 04/30/2013   Hyperglycemia 04/30/2013   Low back pain with right-sided sciatica 04/30/2013   Osteopenia, 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   Family history of colonic polyps 04/30/2013   Sjogren's syndrome (HCC) 10/27/2012   Keratoconjunctivitis sicca, 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's    Encounter for long-term (current) use of other medications    Disorder of bone and cartilage    Allergic rhinitis due to pollen    Cervical spondylosis without myelopathy    Unspecified glaucoma    Benign essential hypertension    Mixed hyperlipidemia    Past Medical History:  Diagnosis Date   Acute upper respiratory infections of unspecified site    Allergic rhinitis due to pollen    Arthritis    Benign essential hypertension    Bursitis    Cervical spondylosis without myelopathy    Cervicalgia    Controlled type 2 diabetes mellitus without complication, without long-term current use of insulin  (HCC) 10/01/2019   Diaphragmatic hernia without mention of obstruction or gangrene    Disorder of bone and cartilage, unspecified    Diverticulosis of colon (without mention of hemorrhage)    Encounter for long-term (current) use of other medications    GERD (gastroesophageal reflux disease)    Glaucoma    open angle   High blood sugar    History of bone density study    History of colonoscopy    History of echocardiogram    History of hiatal hernia    History of Papanicolaou smear of cervix    Hyperlipidemia LDL goal < 100    Hypertension    Keratoconjunctivitis sicca, 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's    Other and unspecified hyperlipidemia    Pain in joint, site unspecified    Pre-diabetes    Routine general medical examination at a health care facility    Sinusitis    Sjogren's syndrome (HCC) 10/27/2012   Tear film  insufficiency, unspecified    Unspecified disorder of kidney and ureter    Unspecified glaucoma(365.9)     Family History  Problem Relation Age of Onset   Heart attack Mother    Breast cancer Mother    Heart failure Mother    Pancreatitis Father    Diabetes Brother    Heart disease Brother        By-pass surgery   Lupus Cousin     Past Surgical History:  Procedure Laterality Date   BACK SURGERY  BELPHAROPTOSIS REPAIR     brow lift   bil   CATARACT EXTRACTION W/ INTRAOCULAR LENS  IMPLANT, BILATERAL Bilateral 12/2014   Dr. Charmayne   CHOLECYSTECTOMY  1993   Dr.Lindsey    DILATION AND CURETTAGE OF UTERUS     GALLBLADDER SURGERY     LUMBAR LAMINECTOMY/DECOMPRESSION MICRODISCECTOMY N/A 04/21/2017   Procedure: BILATERAL LUMBAR FOUR- LUMBAR FIVE AND LEFT LUMBAR FIVE- SACRAL ONE LAMINOTOMY, MEDIAL FACETECTOMY AND FORAMINOTOMIES;  Surgeon: Alix Charleston, MD;  Location: [facility] OR;  Service: Neurosurgery;  Laterality: N/A;   NASAL SEPTUM SURGERY     Social History   Occupational History   Occupation: Librarian, academic  Tobacco Use   Smoking status: Never   Smokeless tobacco: Never  Vaping Use   Vaping status: Never Used  Substance and Sexual Activity   Alcohol  use: Yes    Comment: occasionally   Drug use: No   Sexual activity: Not on file

## 2023-04-21 ENCOUNTER — Encounter: Payer: Self-pay | Admitting: Obstetrics

## 2023-04-21 ENCOUNTER — Ambulatory Visit (INDEPENDENT_AMBULATORY_CARE_PROVIDER_SITE_OTHER): Payer: Medicare HMO | Admitting: Obstetrics

## 2023-04-21 VITALS — BP 143/95 | HR 87

## 2023-04-21 DIAGNOSIS — N393 Stress incontinence (female) (male): Secondary | ICD-10-CM | POA: Insufficient documentation

## 2023-04-21 DIAGNOSIS — N3281 Overactive bladder: Secondary | ICD-10-CM

## 2023-04-21 DIAGNOSIS — N952 Postmenopausal atrophic vaginitis: Secondary | ICD-10-CM | POA: Insufficient documentation

## 2023-04-21 MED ORDER — MIRABEGRON ER 50 MG PO TB24: 50.0000 mg | ORAL_TABLET | Freq: Every day | ORAL | 2 refills | Status: DC

## 2023-04-21 MED ORDER — ESTRADIOL 0.1 MG/GM VA CREA: 0.5000 g | TOPICAL_CREAM | VAGINAL | 3 refills | Status: DC

## 2023-04-21 MED ORDER — MIRABEGRON ER 50 MG PO TB24: 50.0000 mg | ORAL_TABLET | Freq: Every day | ORAL | 0 refills | Status: DC

## 2023-04-21 MED ORDER — MIRABEGRON ER 25 MG PO TB24
25.0000 mg | ORAL_TABLET | Freq: Every day | ORAL | 0 refills | Status: DC
Start: 2023-04-21 — End: 2023-04-21

## 2023-04-21 NOTE — Assessment & Plan Note (Signed)
 For symptomatic vaginal atrophy options include lubrication with a water-based lubricant, personal hygiene measures and barrier protection against wetness, and estrogen replacement in the form of vaginal cream, vaginal tablets, or a time-released vaginal ring.   - Rx low dose vaginal estrogen - reassured pt, reports prior reservation due to mother's history of breast cancer - We discussed the potential risks associated with hormone replacement including stroke, heart attack, and blood clots; and the fact that these risks are very low with vaginal estrogen use due to the very low systemic absorption rate of ~ 0.01% with a twice-week regimen.

## 2023-04-21 NOTE — Patient Instructions (Addendum)
 For vaginal atrophy (thinning of the vaginal tissue that can cause dryness and burning) and UTI prevention we discussed estrogen replacement in the form of vaginal cream.   Start vaginal estrogen therapy nightly for two weeks then 2 times weekly at night. This can be placed with your finger or an applicator inside the vagina and around the urethra.  Please let us  know if the prescription is too expensive and we can look for alternative options.   Is vaginal estrogen therapy safe for me? Vaginal estrogen preparations act on the vaginal skin, and only a very tiny amount is absorbed into the bloodstream (0.01%).  They work in a similar way to hand or face cream.  There is minimal absorption and they are therefore perfectly safe. If you have had breast cancer and have persistent troublesome symptoms which aren't settling with vaginal moisturisers and lubricants, local estrogen treatment may be a possibility, but consultation with your oncologist should take place first.   We discussed the symptoms of overactive bladder (OAB), which include urinary urgency, urinary frequency, night-time urination, with or without urge incontinence.  We discussed management including behavioral therapy (decreasing bladder irritants by following a bladder diet, urge suppression strategies, timed voids, bladder retraining), physical therapy, medication; and for refractory cases posterior tibial nerve stimulation, sacral neuromodulation, and intravesical botulinum toxin injection.   For Beta-3 agonist medication, we discussed the potential side effect of elevated blood pressure which is more likely to occur in individuals with uncontrolled hypertension. You were given prescription for Myrbetriq  25 mg.  It can take a month to start working so give it time, but if you have bothersome side effects call sooner and we can try a different medication.  Call us  if you have trouble filling the prescription or if it's not covered by your  insurance.

## 2023-04-21 NOTE — Assessment & Plan Note (Signed)
-   Reports minimal bother from leakage or pad use. Mostly bothered by urinary frequency/urgency,  PTNS x 5 with worsening symptoms, incontinence ring pessary #0 and #1.  - unclear h/o glaucoma, s/p cataract surgery - Tried Gemtesa  (runny nose and bowel frequency), Trospium  20mg  BID (dry mouth). We discussed the symptoms of overactive bladder (OAB), which include urinary urgency, urinary frequency, nocturia, with or without urge incontinence.  While we do not know the exact etiology of OAB, several treatment options exist. We discussed management including behavioral therapy (decreasing bladder irritants, urge suppression strategies, timed voids, bladder retraining), physical therapy, medication; for refractory cases posterior tibial nerve stimulation, sacral neuromodulation, and intravesical botulinum toxin injection.  For anticholinergic medications, we discussed the potential side effects of anticholinergics including dry eyes, dry mouth, constipation, cognitive impairment and urinary retention. For Beta-3 agonist medication, we discussed the potential side effect of elevated blood pressure which is more likely to occur in individuals with uncontrolled hypertension. - Rx mirabegron  25mg  (cost prohibitive), changed to 50mg  with 90day supply per patient request. Recommended BP monitoring for 1-2 weeks, pt to call or message via MyChart BP ranges. Discontinue if > 160/110s or if she develops any signs and symptoms of elevated BP.  For refractory OAB we reviewed the procedure for intravesical Botox injection with cystoscopy in the office and reviewed the risks, benefits and alternatives of treatment including but not limited to infection, need for self-catheterization and need for repeat therapy.  We discussed that there is a 5-15% chance of needing to catheterize with Botox and that this usually resolves in a few months; however can persist for longer periods of time.  Typically Botox injections would need to  be repeated every 3-12 months since this is not a permanent therapy.   We discussed the role of sacral neuromodulation and how it works. It requires a test phase, and documentation of bladder function via diary. After a successful test period, a permanent wire and generator are placed in the OR. The battery lasts 5 years on average and would need to be replaced surgically.  The goal of this therapy is at least a 50% improvement in symptoms. It is NOT realistic to expect a 100% cure.  We reviewed the fact that about 30% of patients fail the test phase and are not candidates for permanent generator placement.  We discussed the risk of infection and that the patient would not be able to get an MRI once the device is placed. There are two companies that provide this therapy: [employer] and [employer]. [employer]' product is new and is similar to [employer]'s, but has advantages of a smaller and rechargeable battery and being able to have an MRI with the implant. For all procedures, we discussed risks of bleeding, infection, damage to surrounding organs including bowel, bladder, blood vessels, ureters and nerves, need for further surgery, risk of postoperative urinary incontinence or retention with need to catheterize, recurrent prolapse, numbness and weakness at any body site, buttock pain, and the rarer risks of blood clot, heart attack, pneumonia, death.    We also discussed the role of percutaneous tibial nerve stimulation and how it works.  She understands it requires 12 weekly visits for temporary neuromodulation of the sacral nerve roots via the tibial nerve and that she may then require continued tapered treatment.  She will return for the procedure. All questions were answered.

## 2023-04-21 NOTE — Progress Notes (Signed)
 Felicia Hall Return Visit  SUBJECTIVE  History of Present Illness: Felicia Hall is a 78 y.o. female presented for scheduled periurethral bulking.  Presented for [employer], reports feeling anxious regarding procedure.  Most bothered by urinary frequency, urgency. Symptoms started in late 40s when pt started to experience perimenopausal symptoms. Reports UUI 2-3x/day and SUI 2-3x/day. Reports minimal bother from leakage or pad use. Previously avoided B3 agonist due to HTN and anticholinergics due to glaucoma. Tried Gemtesa  (runny nose and bowel frequency), Trospium  20mg  BID (dry mouth). Tried pelvic floor PT with some improvement of SUI and underwent PTNS x 5 with worsening symptoms, incontinence ring pessary #0 and #1.  Previously reviewed botox injections, patient declined due to anxiety regarding potential complications.  Explained periurethral bulking procedure and diagnosis of MUI.  Past Medical History: Patient  has a past medical history of Acute upper respiratory infections of unspecified site, Allergic rhinitis due to pollen, Arthritis, Benign essential hypertension, Bursitis, Cervical spondylosis without myelopathy, Cervicalgia, Controlled type 2 diabetes mellitus without complication, without long-term current use of insulin  (HCC) (10/01/2019), Diaphragmatic hernia without mention of obstruction or gangrene, Disorder of bone and cartilage, unspecified, Diverticulosis of colon (without mention of hemorrhage), Encounter for long-term (current) use of other medications, GERD (gastroesophageal reflux disease), Glaucoma, High blood sugar, History of bone density study, History of colonoscopy, History of echocardiogram, History of hiatal hernia, History of Papanicolaou smear of cervix, Hyperlipidemia LDL goal < 100, Hypertension, Keratoconjunctivitis sicca, 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's, Other and unspecified hyperlipidemia, Pain in joint, site unspecified, Pre-diabetes, Routine general  medical examination at a health care facility, Sinusitis, Sjogren's syndrome (HCC) (10/27/2012), Tear film insufficiency, unspecified, Unspecified disorder of kidney and ureter, and Unspecified glaucoma(365.9).   Past Surgical History: She  has a past surgical history that includes Cholecystectomy (1993); Cataract extraction w/ intraocular lens  implant, bilateral (Bilateral, 12/2014); Nasal septum surgery; Blepharoptosis repair; Dilation and curettage of uterus; Lumbar laminectomy/decompression microdiscectomy (N/A, 04/21/2017); Gallbladder surgery; and Back surgery.   Medications: She has a current medication list which includes the following prescription(s): [START ON 04/25/2023] estradiol , acetaminophen , aspirin  ec, calcium  citrate-vitamin d , chlorpheniramine, cholecalciferol , ezetimibe , losartan , metformin , metoprolol  succinate, [START ON 05/22/2023] mirabegron  er, pantoprazole , prednisone , and rosuvastatin .   Allergies: Patient is allergic to statins, allopurinol , and darvocet [propoxyphene n-acetaminophen ].   Social History: Patient  reports that she has never smoked. She has never used smokeless tobacco. She reports current alcohol  use. She reports that she does not use drugs.     OBJECTIVE     Physical Exam: Vitals:   04/21/23 0859 04/21/23 0900  BP: (!) 158/83 (!) 143/95  Pulse: 80 87   Gen: No apparent distress, A&O x 3.  Detailed Urogynecologic Evaluation:  Deferred. Prior exam showed:      No data to display             ASSESSMENT AND PLAN    Felicia Hall is a 78 y.o. with:  1. Overactive bladder   2. SUI (stress urinary incontinence, female)     Overactive bladder -     Mirabegron  ER; Take 1 tablet (50 mg total) by mouth daily.  Dispense: 90 tablet; Refill: 0  SUI (stress urinary incontinence, female)  Other orders -     Estradiol ; Place 0.5 g vaginally 2 (two) times a week. Place 0.5g nightly for two weeks then twice a week after  Dispense: 30 g; Refill:  3   Time spent: I spent 53 minutes dedicated to the care of this patient  on the date of this encounter to include pre-visit review of records, face-to-face time with the patient discussing mixed urinary incontinence, vaginal atrophy, and post visit documentation and ordering medication/ testing.    Felicia ONEIDA Gillis, MD

## 2023-04-21 NOTE — Assessment & Plan Note (Signed)
-   presented for periurethral bulking - For treatment of stress urinary incontinence,  non-surgical options include expectant management, weight loss, physical therapy, as well as a pessary.  Surgical options include a midurethral sling, Burch urethropexy, and transurethral injection of a bulking agent. - explained that it may not improve urinary frequency/urgency, which is her primary bothersome symptom. Through joint decision making, will postpone procedure and encouraged pt to review options.

## 2023-04-27 DIAGNOSIS — M9905 Segmental and somatic dysfunction of pelvic region: Secondary | ICD-10-CM | POA: Diagnosis not present

## 2023-04-27 DIAGNOSIS — M9904 Segmental and somatic dysfunction of sacral region: Secondary | ICD-10-CM | POA: Diagnosis not present

## 2023-04-27 DIAGNOSIS — M5136 Other intervertebral disc degeneration, lumbar region with discogenic back pain only: Secondary | ICD-10-CM | POA: Diagnosis not present

## 2023-04-27 DIAGNOSIS — M9903 Segmental and somatic dysfunction of lumbar region: Secondary | ICD-10-CM | POA: Diagnosis not present

## 2023-05-05 DIAGNOSIS — M5136 Other intervertebral disc degeneration, lumbar region with discogenic back pain only: Secondary | ICD-10-CM | POA: Diagnosis not present

## 2023-05-05 DIAGNOSIS — M9905 Segmental and somatic dysfunction of pelvic region: Secondary | ICD-10-CM | POA: Diagnosis not present

## 2023-05-05 DIAGNOSIS — M9903 Segmental and somatic dysfunction of lumbar region: Secondary | ICD-10-CM | POA: Diagnosis not present

## 2023-05-05 DIAGNOSIS — M9904 Segmental and somatic dysfunction of sacral region: Secondary | ICD-10-CM | POA: Diagnosis not present

## 2023-05-08 ENCOUNTER — Other Ambulatory Visit: Payer: Self-pay

## 2023-05-08 ENCOUNTER — Emergency Department (HOSPITAL_COMMUNITY): Payer: Medicare HMO

## 2023-05-08 ENCOUNTER — Observation Stay (HOSPITAL_COMMUNITY)
Admission: EM | Admit: 2023-05-08 | Discharge: 2023-05-10 | Disposition: A | Discharge status: Home / Self Care | Payer: Medicare HMO | Attending: Internal Medicine | Admitting: Internal Medicine

## 2023-05-08 ENCOUNTER — Encounter (HOSPITAL_COMMUNITY): Payer: Self-pay | Admitting: *Deleted

## 2023-05-08 DIAGNOSIS — Z1152 Encounter for screening for COVID-19: Secondary | ICD-10-CM | POA: Diagnosis not present

## 2023-05-08 DIAGNOSIS — I959 Hypotension, unspecified: Secondary | ICD-10-CM | POA: Diagnosis not present

## 2023-05-08 DIAGNOSIS — R29818 Other symptoms and signs involving the nervous system: Secondary | ICD-10-CM | POA: Diagnosis not present

## 2023-05-08 DIAGNOSIS — I10 Essential (primary) hypertension: Secondary | ICD-10-CM | POA: Diagnosis not present

## 2023-05-08 DIAGNOSIS — Z7902 Long term (current) use of antithrombotics/antiplatelets: Secondary | ICD-10-CM | POA: Insufficient documentation

## 2023-05-08 DIAGNOSIS — Z743 Need for continuous supervision: Secondary | ICD-10-CM | POA: Diagnosis not present

## 2023-05-08 DIAGNOSIS — I7774 Dissection of vertebral artery: Secondary | ICD-10-CM | POA: Diagnosis not present

## 2023-05-08 DIAGNOSIS — R42 Dizziness and giddiness: Principal | ICD-10-CM

## 2023-05-08 DIAGNOSIS — Z79899 Other long term (current) drug therapy: Secondary | ICD-10-CM | POA: Diagnosis not present

## 2023-05-08 DIAGNOSIS — I6502 Occlusion and stenosis of left vertebral artery: Secondary | ICD-10-CM | POA: Diagnosis not present

## 2023-05-08 DIAGNOSIS — I4430 Unspecified atrioventricular block: Secondary | ICD-10-CM | POA: Diagnosis not present

## 2023-05-08 DIAGNOSIS — H409 Unspecified glaucoma: Secondary | ICD-10-CM | POA: Insufficient documentation

## 2023-05-08 DIAGNOSIS — R110 Nausea: Secondary | ICD-10-CM | POA: Diagnosis not present

## 2023-05-08 DIAGNOSIS — Z7984 Long term (current) use of oral hypoglycemic drugs: Secondary | ICD-10-CM | POA: Insufficient documentation

## 2023-05-08 DIAGNOSIS — E119 Type 2 diabetes mellitus without complications: Secondary | ICD-10-CM | POA: Diagnosis not present

## 2023-05-08 DIAGNOSIS — E1165 Type 2 diabetes mellitus with hyperglycemia: Secondary | ICD-10-CM

## 2023-05-08 DIAGNOSIS — M3500 Sicca syndrome, unspecified: Secondary | ICD-10-CM | POA: Diagnosis present

## 2023-05-08 LAB — URINALYSIS, W/ REFLEX TO CULTURE (INFECTION SUSPECTED)
Bilirubin Urine: NEGATIVE
Glucose, UA: NEGATIVE mg/dL
Hgb urine dipstick: NEGATIVE
Ketones, ur: NEGATIVE mg/dL
Leukocytes,Ua: NEGATIVE
Nitrite: NEGATIVE
Protein, ur: NEGATIVE mg/dL
Specific Gravity, Urine: 1.03 (ref 1.005–1.030)
pH: 7 (ref 5.0–8.0)

## 2023-05-08 LAB — BASIC METABOLIC PANEL
Anion gap: 8 (ref 5–15)
BUN: 11 mg/dL (ref 8–23)
CO2: 22 mmol/L (ref 22–32)
Calcium: 8.5 mg/dL — ABNORMAL LOW (ref 8.9–10.3)
Chloride: 103 mmol/L (ref 98–111)
Creatinine, Ser: 0.49 mg/dL (ref 0.44–1.00)
GFR, Estimated: >60 mL/min (ref >60)
Glucose, Bld: 141 mg/dL — ABNORMAL HIGH (ref 70–99)
Potassium: 3.8 mmol/L (ref 3.5–5.1)
Sodium: 133 mmol/L — ABNORMAL LOW (ref 135–145)

## 2023-05-08 LAB — CBC WITH DIFFERENTIAL/PLATELET
Abs Immature Granulocytes: 0.03 10*3/uL (ref 0.00–0.07)
Basophils Absolute: 0.1 10*3/uL (ref 0.0–0.1)
Basophils Relative: 1 %
Eosinophils Absolute: 0.3 10*3/uL (ref 0.0–0.5)
Eosinophils Relative: 3 %
HCT: 42.1 % (ref 36.0–46.0)
Hemoglobin: 13.9 g/dL (ref 12.0–15.0)
Immature Granulocytes: 0 %
Lymphocytes Relative: 14 %
Lymphs Abs: 1.5 10*3/uL (ref 0.7–4.0)
MCH: 31.2 pg (ref 26.0–34.0)
MCHC: 33 g/dL (ref 30.0–36.0)
MCV: 94.4 fL (ref 80.0–100.0)
Monocytes Absolute: 0.6 10*3/uL (ref 0.1–1.0)
Monocytes Relative: 6 %
Neutro Abs: 8.5 10*3/uL — ABNORMAL HIGH (ref 1.7–7.7)
Neutrophils Relative %: 76 %
Platelets: 224 10*3/uL (ref 150–400)
RBC: 4.46 MIL/uL (ref 3.87–5.11)
RDW: 13 % (ref 11.5–15.5)
WBC: 11 10*3/uL — ABNORMAL HIGH (ref 4.0–10.5)
nRBC: 0 % (ref 0.0–0.2)

## 2023-05-08 LAB — RESP PANEL BY RT-PCR (RSV, FLU A&B, COVID)  RVPGX2
Influenza A by PCR: NEGATIVE
Influenza B by PCR: NEGATIVE
Resp Syncytial Virus by PCR: NEGATIVE
SARS Coronavirus 2 by RT PCR: NEGATIVE

## 2023-05-08 LAB — TROPONIN I (HIGH SENSITIVITY): Troponin I (High Sensitivity): <2 ng/L (ref <18)

## 2023-05-08 MED ORDER — ONDANSETRON HCL 4 MG/2ML IJ SOLN
4.0000 mg | Freq: Once | INTRAMUSCULAR | Status: AC
  Administered 2023-05-08: 4 mg via INTRAVENOUS
  Filled 2023-05-08: qty 2

## 2023-05-08 MED ORDER — SODIUM CHLORIDE 0.9 % IV BOLUS
500.0000 mL | Freq: Once | INTRAVENOUS | Status: AC
  Administered 2023-05-08: 500 mL via INTRAVENOUS

## 2023-05-08 MED ORDER — ASPIRIN 81 MG PO CHEW
324.0000 mg | CHEWABLE_TABLET | Freq: Once | ORAL | Status: AC
  Administered 2023-05-08: 324 mg via ORAL
  Filled 2023-05-08: qty 4

## 2023-05-08 MED ORDER — LORAZEPAM 2 MG/ML IJ SOLN
1.0000 mg | Freq: Once | INTRAMUSCULAR | Status: AC
  Administered 2023-05-08: 1 mg via INTRAVENOUS
  Filled 2023-05-08: qty 1

## 2023-05-08 MED ORDER — DIAZEPAM 5 MG/ML IJ SOLN: 2.5000 mg | Freq: Once | INTRAMUSCULAR | Status: DC

## 2023-05-08 MED ORDER — LACTATED RINGERS IV BOLUS
500.0000 mL | Freq: Once | INTRAVENOUS | Status: AC
  Administered 2023-05-08: 500 mL via INTRAVENOUS

## 2023-05-08 MED ORDER — ONDANSETRON 4 MG PO TBDP
4.0000 mg | ORAL_TABLET | Freq: Once | ORAL | Status: AC
  Administered 2023-05-08: 4 mg via ORAL
  Filled 2023-05-08: qty 1

## 2023-05-08 MED ORDER — CLOPIDOGREL BISULFATE 300 MG PO TABS
300.0000 mg | ORAL_TABLET | Freq: Once | ORAL | Status: AC
Start: 2023-05-08 — End: 2023-05-08
  Administered 2023-05-08: 300 mg via ORAL
  Filled 2023-05-08: qty 1

## 2023-05-08 MED ORDER — IOHEXOL 350 MG/ML SOLN
75.0000 mL | Freq: Once | INTRAVENOUS | Status: AC | PRN
  Administered 2023-05-08: 75 mL via INTRAVENOUS

## 2023-05-08 MED ORDER — MECLIZINE HCL 25 MG PO TABS
25.0000 mg | ORAL_TABLET | Freq: Once | ORAL | Status: AC
Start: 2023-05-08 — End: 2023-05-08
  Administered 2023-05-08: 25 mg via ORAL
  Filled 2023-05-08: qty 1

## 2023-05-08 NOTE — ED Notes (Signed)
CMP & CBC being drawn for a 3rd time due to lab advising they have not received samples.   Writer personally delivered lab work

## 2023-05-08 NOTE — Discharge Instructions (Addendum)
6 month follow up scan Neurology follow up No strong movements of head, no gymnastics [facility]

## 2023-05-08 NOTE — ED Provider Notes (Signed)
Past Medical History:  Diagnosis Date   Acute upper respiratory infections of unspecified site    Allergic rhinitis due to pollen    Arthritis    Benign essential hypertension    Bursitis    Cervical spondylosis without myelopathy    Cervicalgia    Controlled type 2 diabetes mellitus without complication, without long-term current use of insulin (HCC) 10/01/2019   Diaphragmatic hernia without mention of obstruction or gangrene    Disorder of bone and cartilage, unspecified    Diverticulosis of colon (without mention of hemorrhage)    Encounter for long-term (current) use of other medications    GERD (gastroesophageal reflux disease)    Glaucoma    open angle   High blood sugar    History of bone density study    History of colonoscopy    History of echocardiogram    History of hiatal hernia    History of Papanicolaou smear of cervix    Hyperlipidemia LDL goal < 100    Hypertension    Keratoconjunctivitis sicca, 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's    Other and unspecified hyperlipidemia    Pain in joint, site unspecified    Pre-diabetes    Routine general medical examination at a health care facility    Sinusitis    Sjogren's syndrome (HCC) 10/27/2012   Tear film insufficiency, unspecified    Unspecified disorder of kidney and ureter    Unspecified glaucoma(365.9)     Physical Exam  BP 136/82 (BP Location: Left Arm)   Pulse 75   Temp (!) 97.4 F (36.3 C)   Resp 16   Ht 5\' 6"  (1.676 m)   Wt 80 kg   SpO2 100%   BMI 28.47 kg/m   Physical Exam  Procedures  Procedures  ED Course / MDM    Received care of patient from Dr. Lockie Mola.  Please see his note for prior history, physical and care.  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  MRI ordered and pending.  MRI negative.  She continues to feel dizzy and having difficulty ambulating.  Ordered  additional testing including urinalysis, troponin, and CTA head and neck due to concern for dizziness, tinnitus to evaluate for vascular etiology of symptoms.  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  She sees a [facility], with last appointment on Thursday.  Does have a history of neck manipulations.  Discussed with Dr. Otelia Limes Neurology.  On my reevaluation, she is still unable to ambulate.  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  In the setting of her difficulty ambulating, will admit to [facility] for further stroke care.    Alvira Monday, MD 05/08/23 2352

## 2023-05-08 NOTE — ED Triage Notes (Signed)
Patient to ED by EMS from [employer] with c/o dizziness. Patient denies falling or LOC. She states she has HX of dizzy spells, MRI completed and was unremarkable. Patient A&Ox4 and able to ambulate.

## 2023-05-08 NOTE — ED Notes (Signed)
Pt gone for MR BRAIN WO CONTRAST

## 2023-05-08 NOTE — ED Provider Notes (Signed)
[place] EMERGENCY DEPARTMENT AT [facility] Provider Note   CSN: [id number] Arrival date & time: 05/08/23  1129     History  Chief Complaint  Patient presents with   Dizziness    Felicia Hall is a 78 y.o. female.  Patient here after episode of [facility] [employer].  Still feels dizzy lightheaded.  History of the same.  Had an MRI couple months ago that was unremarkable.  She has had some chronic ringing in your ears and follows with ENT but has not really been diagnosed with vertigo she states.  She has not taken any medications including meclizine but she does states she has some at home.  It has been couple hours now she still feels off balance.  Denies any weakness numbness tingling vision loss slurred speech.  Denies any headache.  No chest pain shortness of breath.  No nausea or vomiting.  Symptoms seem to get worse when she stands up and tries to walk.  The history is provided by the patient.       Home Medications Prior to Admission medications   Medication Sig Start Date End Date Taking? Authorizing Provider  acetaminophen (TYLENOL) 650 MG CR tablet Take 1,300 mg by mouth every 8 (eight) hours as needed for pain.    [provider]  aspirin EC 81 MG tablet Take 1 tablet (81 mg total) by mouth daily. Swallow whole. 10/01/19   Reed, Tiffany L, DO  calcium citrate-vitamin D (CITRACAL+D) 315-200 MG-UNIT per tablet Take 1 tablet by mouth 4 (four) times daily.    [provider]  chlorpheniramine (CHLOR-TRIMETON) 4 MG tablet Take 4 mg by mouth daily.    [provider]  cholecalciferol (VITAMIN D) 1000 UNITS tablet Take 2,000 Units by mouth daily.     [provider]  estradiol (ESTRACE) 0.1 MG/GM vaginal cream Place 0.5 g vaginally 2 (two) times a week. Place 0.5g nightly for two weeks then twice a week after 04/25/23   Wyatt Haste T, MD  ezetimibe (ZETIA) 10 MG tablet TAKE 1 TABLET BY MOUTH EVERY DAY 01/20/23   [name], MD  losartan (COZAAR) 100 MG tablet TAKE 1 TABLET BY MOUTH EVERY DAY 03/16/23   [name], MD  metFORMIN (GLUCOPHAGE-XR) 500 MG 24 hr tablet Take 1 tablet (500 mg total) by mouth daily with breakfast. 03/04/23   Fargo, Amy E, NP  metoprolol succinate (TOPROL-XL) 25 MG 24 hr tablet TAKE 1 TABLET (25 MG TOTAL) BY MOUTH DAILY. 02/10/23   [name], MD  mirabegron ER (MYRBETRIQ) 50 MG TB24 tablet Take 1 tablet (50 mg total) by mouth daily. 05/22/23   Loleta Chance, MD  pantoprazole (PROTONIX) 40 MG tablet TAKE 1 TABLET BY MOUTH EVERY DAY 09/27/22   Fargo, Amy E, NP  predniSONE (DELTASONE) 10 MG tablet Take 1 tablet (10 mg total) by mouth daily with breakfast. 04/20/23   Adonis Huguenin, NP  rosuvastatin (CRESTOR) 10 MG tablet TAKE 1 TABLET BY MOUTH EVERY DAY 03/25/23   [name], MD      Allergies    Statins, Allopurinol, and Darvocet [propoxyphene n-acetaminophen]    Review of Systems   Review of Systems  Physical Exam Updated Vital Signs BP (!) 146/74 (BP Location: Left Arm)   Pulse 66   Temp 97.6 F (36.4 C) (Oral)   Resp 15   Ht 5\' 6"  (1.676 m)   Wt 80 kg   SpO2 97%  BMI 28.47 kg/m  Physical Exam Vitals and nursing note reviewed.  Constitutional:      General: She is not in acute distress.    Appearance: She is well-developed. She is not ill-appearing.  HENT:     Head: Normocephalic and atraumatic.     Nose: Nose normal.     Mouth/Throat:     Mouth: Mucous membranes are moist.  Eyes:     Extraocular Movements: Extraocular movements intact.     Conjunctiva/sclera: Conjunctivae normal.     Pupils: Pupils are equal, round, and reactive to light.  Cardiovascular:     Rate and Rhythm: Normal rate and regular rhythm.     Pulses: Normal pulses.     Heart sounds: Normal heart sounds. No murmur heard. Pulmonary:     Effort: Pulmonary effort is normal. No respiratory distress.     Breath sounds: Normal breath sounds.  Abdominal:      Palpations: Abdomen is soft.     Tenderness: There is no abdominal tenderness.  Musculoskeletal:        General: No swelling.     Cervical back: Normal range of motion and neck supple.  Skin:    General: Skin is warm and dry.     Capillary Refill: Capillary refill takes less than 2 seconds.  Neurological:     General: No focal deficit present.     Mental Status: She is alert and oriented to person, place, and time.     Cranial Nerves: No cranial nerve deficit.     Sensory: No sensory deficit.     Motor: No weakness.     Coordination: Coordination normal.     Comments: 5+ out of 5 strength all, normal sensation, unsteady gait, normal finger-nose-finger, normal visual fields, normal speech  Psychiatric:        Mood and Affect: Mood normal.     ED Results / Procedures / Treatments   Labs (all labs ordered are listed, but only abnormal results are displayed) Labs Reviewed  CBC WITH DIFFERENTIAL/PLATELET  BASIC METABOLIC PANEL    EKG EKG Interpretation Date/Time:  Sunday May 08 2023 12:20:39 EST Ventricular Rate:  62 PR Interval:  242 QRS Duration:  85 QT Interval:  398 QTC Calculation: 405 R Axis:   23  Text Interpretation: Sinus rhythm Confirmed by Virgina Norfolk [phone]) on 05/08/2023 12:25:42 PM  Radiology No results found.  Procedures Procedures    Medications Ordered in ED Medications  diazepam (VALIUM) injection 2.5 mg (has no administration in time range)  meclizine (ANTIVERT) tablet 25 mg (25 mg Oral Given 05/08/23 1221)  ondansetron (ZOFRAN-ODT) disintegrating tablet 4 mg (4 mg Oral Given 05/08/23 1221)  sodium chloride 0.9 % bolus 500 mL (0 mLs Intravenous Stopped 05/08/23 1332)    ED Course/ Medical Decision Making/ A&P                                 Medical Decision Making Amount and/or Complexity of Data Reviewed Labs: ordered. Radiology: ordered.  Risk Prescription drug management.   Loni Muse Hult with dizziness and balance.  Has had  similar episodes in the past but this is lasting longer than normal.  She has had MRIs in the past were negative for stroke.  She has had chronic ringing in the ears follows with ENT but never really diagnosed with vertigo.  Walking makes it worse.  She has a history of hypertension.  She has  normal orthostatics.  Differential diagnosis likely peripheral vertigo but could be central vertigo.  She does not have any obvious nystagmus.  Will get MRI to rule out stroke check basic labs to look for electrolyte abnormality dehydration.  Will treat vertigo with meclizine Valium and Zofran and fluid bolus.  Disposition per reevaluation MRI.  Handed off to oncoming ED staff with patient pending images.  Neurologically she appears intact but she still has a very unsteady gait.  This chart was dictated using voice recognition software.  Despite best efforts to proofread,  errors can occur which can change the documentation meaning.         Final Clinical Impression(s) / ED Diagnoses Final diagnoses:  Dizziness    Rx / DC Orders ED Discharge Orders     None         Virgina Norfolk, DO 05/08/23 1350

## 2023-05-08 NOTE — ED Notes (Signed)
Dr. Antionette Char at bedside

## 2023-05-08 NOTE — ED Notes (Signed)
Pt called out to use the restroom. On ambulation, pt reports dizziness, swaying noted when pt stood up with nausea. Provider at bedside

## 2023-05-09 ENCOUNTER — Encounter (HOSPITAL_COMMUNITY): Payer: Self-pay | Admitting: Family Medicine

## 2023-05-09 DIAGNOSIS — M3500 Sicca syndrome, unspecified: Secondary | ICD-10-CM | POA: Diagnosis not present

## 2023-05-09 DIAGNOSIS — R42 Dizziness and giddiness: Secondary | ICD-10-CM | POA: Diagnosis not present

## 2023-05-09 DIAGNOSIS — E119 Type 2 diabetes mellitus without complications: Secondary | ICD-10-CM | POA: Diagnosis not present

## 2023-05-09 DIAGNOSIS — Z1152 Encounter for screening for COVID-19: Secondary | ICD-10-CM | POA: Diagnosis not present

## 2023-05-09 DIAGNOSIS — H409 Unspecified glaucoma: Secondary | ICD-10-CM | POA: Diagnosis not present

## 2023-05-09 DIAGNOSIS — Z7984 Long term (current) use of oral hypoglycemic drugs: Secondary | ICD-10-CM | POA: Diagnosis not present

## 2023-05-09 DIAGNOSIS — I7774 Dissection of vertebral artery: Secondary | ICD-10-CM | POA: Diagnosis not present

## 2023-05-09 DIAGNOSIS — Z7902 Long term (current) use of antithrombotics/antiplatelets: Secondary | ICD-10-CM | POA: Diagnosis not present

## 2023-05-09 DIAGNOSIS — I10 Essential (primary) hypertension: Secondary | ICD-10-CM | POA: Diagnosis not present

## 2023-05-09 DIAGNOSIS — Z79899 Other long term (current) drug therapy: Secondary | ICD-10-CM | POA: Diagnosis not present

## 2023-05-09 LAB — BASIC METABOLIC PANEL
Anion gap: 9 (ref 5–15)
BUN: 11 mg/dL (ref 8–23)
CO2: 24 mmol/L (ref 22–32)
Calcium: 8.7 mg/dL — ABNORMAL LOW (ref 8.9–10.3)
Chloride: 105 mmol/L (ref 98–111)
Creatinine, Ser: 0.71 mg/dL (ref 0.44–1.00)
GFR, Estimated: >60 mL/min (ref >60)
Glucose, Bld: 125 mg/dL — ABNORMAL HIGH (ref 70–99)
Potassium: 3.4 mmol/L — ABNORMAL LOW (ref 3.5–5.1)
Sodium: 138 mmol/L (ref 135–145)

## 2023-05-09 LAB — CBC
HCT: 43 % (ref 36.0–46.0)
Hemoglobin: 14 g/dL (ref 12.0–15.0)
MCH: 30.5 pg (ref 26.0–34.0)
MCHC: 32.6 g/dL (ref 30.0–36.0)
MCV: 93.7 fL (ref 80.0–100.0)
Platelets: 230 10*3/uL (ref 150–400)
RBC: 4.59 MIL/uL (ref 3.87–5.11)
RDW: 13.1 % (ref 11.5–15.5)
WBC: 9.2 10*3/uL (ref 4.0–10.5)
nRBC: 0 % (ref 0.0–0.2)

## 2023-05-09 LAB — HEMOGLOBIN A1C
Hgb A1c MFr Bld: 6.3 % — ABNORMAL HIGH (ref 4.8–5.6)
Mean Plasma Glucose: 134.11 mg/dL

## 2023-05-09 LAB — LIPID PANEL
Cholesterol: 112 mg/dL (ref 0–200)
HDL: 39 mg/dL — ABNORMAL LOW (ref >40)
LDL Cholesterol: 50 mg/dL (ref 0–99)
Total CHOL/HDL Ratio: 2.9 {ratio}
Triglycerides: 116 mg/dL (ref <150)
VLDL: 23 mg/dL (ref 0–40)

## 2023-05-09 LAB — CBG MONITORING, ED
Glucose-Capillary: 181 mg/dL — ABNORMAL HIGH (ref 70–99)
Glucose-Capillary: 138 mg/dL — ABNORMAL HIGH (ref 70–99)
Glucose-Capillary: 224 mg/dL — ABNORMAL HIGH (ref 70–99)
Glucose-Capillary: 116 mg/dL — ABNORMAL HIGH (ref 70–99)
Glucose-Capillary: 151 mg/dL — ABNORMAL HIGH (ref 70–99)

## 2023-05-09 MED ORDER — ACETAMINOPHEN 650 MG RE SUPP: 650.0000 mg | RECTAL | Status: DC | PRN

## 2023-05-09 MED ORDER — MECLIZINE HCL 25 MG PO TABS: 25.0000 mg | ORAL_TABLET | Freq: Three times a day (TID) | ORAL | Status: DC | PRN

## 2023-05-09 MED ORDER — INSULIN ASPART 100 UNIT/ML IJ SOLN
0.0000 [IU] | Freq: Every day | INTRAMUSCULAR | Status: DC
  Filled 2023-05-09: qty 0.05

## 2023-05-09 MED ORDER — STROKE: EARLY STAGES OF RECOVERY BOOK
Freq: Once | Status: AC
  Filled 2023-05-09: qty 1

## 2023-05-09 MED ORDER — ACETAMINOPHEN 325 MG PO TABS
650.0000 mg | ORAL_TABLET | ORAL | Status: DC | PRN
  Administered 2023-05-10: 650 mg via ORAL
  Filled 2023-05-09: qty 2

## 2023-05-09 MED ORDER — EZETIMIBE 10 MG PO TABS
10.0000 mg | ORAL_TABLET | Freq: Every day | ORAL | Status: DC
  Administered 2023-05-10: 10 mg via ORAL
  Filled 2023-05-09: qty 1

## 2023-05-09 MED ORDER — ENOXAPARIN SODIUM 40 MG/0.4ML IJ SOSY
40.0000 mg | PREFILLED_SYRINGE | INTRAMUSCULAR | Status: DC
Start: 2023-05-09 — End: 2023-05-10
  Administered 2023-05-09 – 2023-05-10 (×2): 40 mg via SUBCUTANEOUS
  Filled 2023-05-09 (×2): qty 0.4

## 2023-05-09 MED ORDER — CLOPIDOGREL BISULFATE 75 MG PO TABS
75.0000 mg | ORAL_TABLET | Freq: Every day | ORAL | Status: DC
  Administered 2023-05-09 – 2023-05-10 (×2): 75 mg via ORAL
  Filled 2023-05-09 (×2): qty 1

## 2023-05-09 MED ORDER — PANTOPRAZOLE SODIUM 40 MG PO TBEC
40.0000 mg | DELAYED_RELEASE_TABLET | Freq: Every day | ORAL | Status: DC
  Administered 2023-05-09 – 2023-05-10 (×2): 40 mg via ORAL
  Filled 2023-05-09 (×2): qty 1

## 2023-05-09 MED ORDER — ROSUVASTATIN CALCIUM 5 MG PO TABS
10.0000 mg | ORAL_TABLET | Freq: Every day | ORAL | Status: DC
  Administered 2023-05-09 – 2023-05-10 (×2): 10 mg via ORAL
  Filled 2023-05-09: qty 1
  Filled 2023-05-09: qty 2

## 2023-05-09 MED ORDER — ASPIRIN 81 MG PO TBEC
81.0000 mg | DELAYED_RELEASE_TABLET | Freq: Every day | ORAL | Status: DC
  Administered 2023-05-09 – 2023-05-10 (×2): 81 mg via ORAL
  Filled 2023-05-09 (×2): qty 1

## 2023-05-09 MED ORDER — SENNOSIDES-DOCUSATE SODIUM 8.6-50 MG PO TABS: 1.0000 | ORAL_TABLET | Freq: Every evening | ORAL | Status: DC | PRN

## 2023-05-09 MED ORDER — DIAZEPAM 2 MG PO TABS: 2.0000 mg | ORAL_TABLET | Freq: Three times a day (TID) | ORAL | Status: DC | PRN

## 2023-05-09 MED ORDER — BRINZOLAMIDE 1 % OP SUSP
1.0000 [drp] | Freq: Two times a day (BID) | OPHTHALMIC | Status: DC
  Administered 2023-05-09 – 2023-05-10 (×2): 1 [drp] via OPHTHALMIC
  Filled 2023-05-09: qty 10

## 2023-05-09 MED ORDER — POLYVINYL ALCOHOL 1.4 % OP SOLN: 1.0000 [drp] | OPHTHALMIC | Status: DC | PRN

## 2023-05-09 MED ORDER — ACETAMINOPHEN 160 MG/5ML PO SOLN: 650.0000 mg | ORAL | Status: DC | PRN

## 2023-05-09 MED ORDER — INSULIN ASPART 100 UNIT/ML IJ SOLN
0.0000 [IU] | Freq: Three times a day (TID) | INTRAMUSCULAR | Status: DC
  Administered 2023-05-09: 2 [IU] via SUBCUTANEOUS
  Administered 2023-05-10: 1 [IU] via SUBCUTANEOUS
  Filled 2023-05-09: qty 0.06

## 2023-05-09 NOTE — Care Management Obs Status (Signed)
MEDICARE OBSERVATION STATUS NOTIFICATION   Patient Details  Name: Felicia Hall MRN: [id number] Date of Birth: 01-01-1946   Medicare Observation Status Notification Given:  Yes    Princella Ion, LCSW 05/09/2023, 9:03 AM

## 2023-05-09 NOTE — H&P (Signed)
History and Physical    Felicia Hall ZOX:[id number] DOB: 1945/10/01 DOA: 05/08/2023  PCP: Octavia Heir, NP   Patient coming from: Home   Chief Complaint: Dizziness  HPI: Felicia Hall is a 78 y.o. female with medical history significant for Sjogren's syndrome, hypertension, hyperlipidemia, type 2 diabetes mellitus, and glaucoma who presents with dizziness.  Patient was in her usual state of health today when she went shopping but developed dizziness while in the store.  She describes a room spinning sensation that is worse with any movement and better when she remains still.  She has had similar symptoms previously that resolved within a couple hours, but the symptoms have been persistent.  She denies any associated focal numbness or weakness.  She has some neck pain that she reports to be chronic and unchanged.  She denies any recent trauma.  She last had her neck manipulated by chiropractor on 05/05/2023.  ED Course: Upon arrival to the ED, patient is found to be afebrile and saturating well on room air with normal heart rate and stable blood pressure.  Labs are most notable for normal renal function, WBC 11,000, normal hemoglobin, negative respiratory virus panel, undetectable troponin, and unremarkable UA.  There were no acute findings on MRI brain.  CTA of the head and neck demonstrates severe stenosis of the left vertebral artery with findings suspicious for dissection.  Patient was treated in the ED with 1 L of IV fluids, Ativan, meclizine, and Zofran.  She is still unable to ambulate due to her symptoms. Neurology (Dr. Otelia Limes) recommended loading the patient with aspirin and Plavix and admission to [facility].   She passed a stroke swallow screen in ED.   Review of Systems:  All other systems reviewed and apart from HPI, are negative.  Past Medical History:  Diagnosis Date   Acute upper respiratory infections of unspecified site    Allergic rhinitis due to pollen     Arthritis    Benign essential hypertension    Bursitis    Cervical spondylosis without myelopathy    Cervicalgia    Controlled type 2 diabetes mellitus without complication, without long-term current use of insulin (HCC) 10/01/2019   Diaphragmatic hernia without mention of obstruction or gangrene    Disorder of bone and cartilage, unspecified    Diverticulosis of colon (without mention of hemorrhage)    Encounter for long-term (current) use of other medications    GERD (gastroesophageal reflux disease)    Glaucoma    open angle   High blood sugar    History of bone density study    History of colonoscopy    History of echocardiogram    History of hiatal hernia    History of Papanicolaou smear of cervix    Hyperlipidemia LDL goal < 100    Hypertension    Keratoconjunctivitis sicca, 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's    Other and unspecified hyperlipidemia    Pain in joint, site unspecified    Pre-diabetes    Routine general medical examination at a health care facility    Sinusitis    Sjogren's syndrome (HCC) 10/27/2012   Tear film insufficiency, unspecified    Unspecified disorder of kidney and ureter    Unspecified glaucoma(365.9)     Past Surgical History:  Procedure Laterality Date   BACK SURGERY     BELPHAROPTOSIS REPAIR     brow lift   bil   CATARACT EXTRACTION W/ INTRAOCULAR LENS  IMPLANT, BILATERAL Bilateral 12/2014  Dr. Randon Goldsmith   CHOLECYSTECTOMY  1993   Dr.Lindsey    DILATION AND CURETTAGE OF UTERUS     GALLBLADDER SURGERY     LUMBAR LAMINECTOMY/DECOMPRESSION MICRODISCECTOMY N/A 04/21/2017   Procedure: BILATERAL LUMBAR FOUR- LUMBAR FIVE AND LEFT LUMBAR FIVE- SACRAL ONE LAMINOTOMY, MEDIAL FACETECTOMY AND FORAMINOTOMIES;  Surgeon: Shirlean Kelly, MD;  Location: [facility] OR;  Service: Neurosurgery;  Laterality: N/A;   NASAL SEPTUM SURGERY      Social History:   reports that she has never smoked. She has never used smokeless tobacco. She reports current alcohol use. She  reports that she does not use drugs.  Allergies  Allergen Reactions   Statins Other (See Comments)    Lovastatin Muscle problems   Allopurinol Rash   Darvocet [Propoxyphene N-Acetaminophen] Rash    Family History  Problem Relation Age of Onset   Heart attack Mother    Breast cancer Mother    Heart failure Mother    Pancreatitis Father    Diabetes Brother    Heart disease Brother        By-pass surgery   Lupus Cousin      Prior to Admission medications   Medication Sig Start Date End Date Taking? Authorizing Provider  acetaminophen (TYLENOL) 650 MG CR tablet Take 1,300 mg by mouth every 8 (eight) hours as needed for pain.   Yes [provider]  aspirin EC 81 MG tablet Take 1 tablet (81 mg total) by mouth daily. Swallow whole. 10/01/19  Yes Reed, Tiffany L, DO  brinzolamide (AZOPT) 1 % ophthalmic suspension 1 drop every 12 (twelve) hours. 04/12/23  Yes [provider]  chlorpheniramine (CHLOR-TRIMETON) 4 MG tablet Take 4 mg by mouth daily.   Yes [provider]  cholecalciferol (VITAMIN D) 1000 UNITS tablet Take 2,000 Units by mouth daily.    Yes [provider]  estradiol (ESTRACE) 0.1 MG/GM vaginal cream Place 0.5 g vaginally 2 (two) times a week. Place 0.5g nightly for two weeks then twice a week after 04/25/23  Yes Wyatt Haste T, MD  ezetimibe (ZETIA) 10 MG tablet TAKE 1 TABLET BY MOUTH EVERY DAY 01/20/23  Yes [name], MD  losartan (COZAAR) 100 MG tablet TAKE 1 TABLET BY MOUTH EVERY DAY 03/16/23  Yes [name], MD  metFORMIN (GLUCOPHAGE-XR) 500 MG 24 hr tablet Take 1 tablet (500 mg total) by mouth daily with breakfast. 03/04/23  Yes Fargo, Amy E, NP  metoprolol succinate (TOPROL-XL) 25 MG 24 hr tablet TAKE 1 TABLET (25 MG TOTAL) BY MOUTH DAILY. 02/10/23  Yes [name], MD  pantoprazole (PROTONIX) 40 MG tablet TAKE 1 TABLET BY MOUTH EVERY DAY 09/27/22  Yes Fargo, Amy E, NP  rosuvastatin (CRESTOR) 10 MG  tablet TAKE 1 TABLET BY MOUTH EVERY DAY 03/25/23  Yes [name], MD    Physical Exam: Vitals:   05/08/23 1730 05/08/23 1902 05/08/23 2138 05/08/23 2145  BP: (!) 142/64  136/82 139/83  Pulse: 61  75 72  Resp: 16  16   Temp:  97.7 F (36.5 C) (!) 97.4 F (36.3 C)   TempSrc:  Oral    SpO2: 95%  100% 96%  Weight:      Height:        Constitutional: NAD, no pallor or diaphoresis   Eyes: PERTLA, lids and conjunctivae normal ENMT: Mucous membranes are moist. Posterior pharynx clear of any exudate or lesions.   Neck: supple, no masses  Respiratory: no wheezing, no crackles. No accessory  muscle use.  Cardiovascular: S1 & S2 heard, regular rate and rhythm. No extremity edema.  Abdomen: No distension, no tenderness, soft. Bowel sounds active.  Musculoskeletal: no clubbing / cyanosis. No joint deformity upper and lower extremities.   Skin: no significant rashes, lesions, ulcers. Warm, dry, well-perfused. Neurologic: CN 2-12 grossly intact. Horizontal nystagmus. Sensation to light touch intact. Strength 5/5 in all 4 limbs. Alert and oriented.  Psychiatric: Calm. Cooperative.    Labs and Imaging on Admission: I have personally reviewed following labs and imaging studies  CBC: Recent Labs  Lab 05/08/23 1348  WBC 11.0*  NEUTROABS 8.5*  HGB 13.9  HCT 42.1  MCV 94.4  PLT 224   Basic Metabolic Panel: Recent Labs  Lab 05/08/23 1348  NA 133*  K 3.8  CL 103  CO2 22  GLUCOSE 141*  BUN 11  CREATININE 0.49  CALCIUM 8.5*   GFR: Estimated Creatinine Clearance: 62.8 mL/min (by C-G formula based on SCr of 0.49 mg/dL). Liver Function Tests: No results for input(s): "AST", "ALT", "ALKPHOS", "BILITOT", "PROT", "ALBUMIN" in the last 168 hours. No results for input(s): "LIPASE", "AMYLASE" in the last 168 hours. No results for input(s): "AMMONIA" in the last 168 hours. Coagulation Profile: No results for input(s): "INR", "PROTIME" in the last 168 hours. Cardiac  Enzymes: No results for input(s): "CKTOTAL", "CKMB", "CKMBINDEX", "TROPONINI" in the last 168 hours. BNP (last 3 results) No results for input(s): "PROBNP" in the last 8760 hours. HbA1C: No results for input(s): "HGBA1C" in the last 72 hours. CBG: No results for input(s): "GLUCAP" in the last 168 hours. Lipid Profile: No results for input(s): "CHOL", "HDL", "LDLCALC", "TRIG", "CHOLHDL", "LDLDIRECT" in the last 72 hours. Thyroid Function Tests: No results for input(s): "TSH", "T4TOTAL", "FREET4", "T3FREE", "THYROIDAB" in the last 72 hours. Anemia Panel: No results for input(s): "VITAMINB12", "FOLATE", "FERRITIN", "TIBC", "IRON", "RETICCTPCT" in the last 72 hours. Urine analysis:    Component Value Date/Time   COLORURINE STRAW (A) 05/08/2023 2245   APPEARANCEUR CLEAR 05/08/2023 2245   APPEARANCEUR Clear 04/26/2013 0839   LABSPEC 1.030 05/08/2023 2245   PHURINE 7.0 05/08/2023 2245   GLUCOSEU NEGATIVE 05/08/2023 2245   HGBUR NEGATIVE 05/08/2023 2245   BILIRUBINUR NEGATIVE 05/08/2023 2245   BILIRUBINUR Negative 09/17/2021 1013   BILIRUBINUR Negative 04/26/2013 0839   KETONESUR NEGATIVE 05/08/2023 2245   PROTEINUR NEGATIVE 05/08/2023 2245   UROBILINOGEN 0.2 09/17/2021 1013   UROBILINOGEN 0.2 06/05/2014 2124   NITRITE NEGATIVE 05/08/2023 2245   LEUKOCYTESUR NEGATIVE 05/08/2023 2245   Sepsis Labs: @LABRCNTIP (procalcitonin:4,lacticidven:4) ) Recent Results (from the past 240 hours)  Resp panel by RT-PCR (RSV, Flu A&B, Covid) Anterior Nasal Swab     Status: None   Collection Time: 05/08/23  6:38 PM   Specimen: Anterior Nasal Swab  Result Value Ref Range Status   SARS Coronavirus 2 by RT PCR NEGATIVE NEGATIVE Final    Comment: (NOTE) SARS-CoV-2 target nucleic acids are NOT DETECTED.  The SARS-CoV-2 RNA is generally detectable in upper respiratory specimens during the acute phase of infection. The lowest concentration of SARS-CoV-2 viral copies this assay can detect is 138  copies/mL. A negative result does not preclude SARS-Cov-2 infection and should not be used as the sole basis for treatment or other patient management decisions. A negative result may occur with  improper specimen collection/handling, submission of specimen other than nasopharyngeal swab, presence of viral mutation(s) within the areas targeted by this assay, and inadequate number of viral copies(<138 copies/mL). A negative result must be combined  with clinical observations, patient history, and epidemiological information. The expected result is Negative.  Fact Sheet for Patients:  BloggerCourse.com  Fact Sheet for Healthcare Providers:  SeriousBroker.it  This test is no t yet approved or cleared by the [place] FDA and  has been authorized for detection and/or diagnosis of SARS-CoV-2 by FDA under an Emergency Use Authorization (EUA). This EUA will remain  in effect (meaning this test can be used) for the duration of the COVID-19 declaration under Section 564(b)(1) of the Act, 21 U.S.C.section 360bbb-3(b)(1), unless the authorization is terminated  or revoked sooner.       Influenza A by PCR NEGATIVE NEGATIVE Final   Influenza B by PCR NEGATIVE NEGATIVE Final    Comment: (NOTE) The Xpert Xpress SARS-CoV-2/FLU/RSV plus assay is intended as an aid in the diagnosis of influenza from Nasopharyngeal swab specimens and should not be used as a sole basis for treatment. Nasal washings and aspirates are unacceptable for Xpert Xpress SARS-CoV-2/FLU/RSV testing.  Fact Sheet for Patients: BloggerCourse.com  Fact Sheet for Healthcare Providers: SeriousBroker.it  This test is not yet approved or cleared by the [place] FDA and has been authorized for detection and/or diagnosis of SARS-CoV-2 by FDA under an Emergency Use Authorization (EUA). This EUA will remain in effect (meaning  this test can be used) for the duration of the COVID-19 declaration under Section 564(b)(1) of the Act, 21 U.S.C. section 360bbb-3(b)(1), unless the authorization is terminated or revoked.     Resp Syncytial Virus by PCR NEGATIVE NEGATIVE Final    Comment: (NOTE) Fact Sheet for Patients: BloggerCourse.com  Fact Sheet for Healthcare Providers: SeriousBroker.it  This test is not yet approved or cleared by the [place] FDA and has been authorized for detection and/or diagnosis of SARS-CoV-2 by FDA under an Emergency Use Authorization (EUA). This EUA will remain in effect (meaning this test can be used) for the duration of the COVID-19 declaration under Section 564(b)(1) of the Act, 21 U.S.C. section 360bbb-3(b)(1), unless the authorization is terminated or revoked.  Performed at [facility], [address] [place], [place] [address]      Radiological Exams on Admission: CT ANGIO HEAD NECK W WO CM Result Date: 05/08/2023 CLINICAL DATA:  Tinnitus, nonpulsatile, asymmetric or unilateral whooshing in left ear, off balance EXAM: CT ANGIOGRAPHY HEAD AND NECK WITH AND WITHOUT CONTRAST TECHNIQUE: Multidetector CT imaging of the head and neck was performed using the standard protocol during bolus administration of intravenous contrast. Multiplanar CT image reconstructions and MIPs were obtained to evaluate the vascular anatomy. Carotid stenosis measurements (when applicable) are obtained utilizing NASCET criteria, using the distal internal carotid diameter as the denominator. RADIATION DOSE REDUCTION: This exam was performed according to the departmental dose-optimization program which includes automated exposure control, adjustment of the mA and/or kV according to patient size and/or use of iterative reconstruction technique. CONTRAST:  75mL OMNIPAQUE IOHEXOL 350 MG/ML SOLN COMPARISON:  None Available. FINDINGS: CT HEAD  FINDINGS Brain: No evidence of acute infarction, hemorrhage, hydrocephalus, extra-axial collection or mass lesion/mass effect. Vascular: See below. Skull: No acute fracture. Sinuses/Orbits: Clear sinuses.  No acute orbital findings. Other: No mastoid effusions. Review of the MIP images confirms the above findings CTA NECK FINDINGS Aortic arch: Calcific atherosclerosis. Great vessel origins are patent. Right carotid system: No evidence of dissection, stenosis (50% or greater), or occlusion. Left carotid system: No evidence of dissection, stenosis (50% or greater), or occlusion. Vertebral arteries: Right vertebral artery is patent without significant stenosis. Severe  stenosis of the proximal left vertebral artery with suggestion of linear filling defect and poor opacification distally. Skeleton: No acute abnormality on limited assessment. Severe multilevel degenerative change. Other neck: No acute abnormality on limited assessment. Upper chest: Biapical pleuroparenchymal scarring. Review of the MIP images confirms the above findings CTA HEAD FINDINGS Anterior circulation: Bilateral intracranial ICAs, MCAs, and ACAs are patent without proximal hemodynamically significant stenosis. Posterior circulation: Bilateral intradural vertebral arteries, basilar artery and bilateral posterior cerebral arteries are patent without proximal hemodynamically significant stenosis. Small vertebrobasilar system with bilateral posterior communicating arteries, anatomic variant. Venous sinuses: As permitted by contrast timing, patent. Review of the MIP images confirms the above findings IMPRESSION: 1. Severe stenosis of the left vertebral artery in the neck proximally with suggestion of a linear filling defect and poor opacification more distally. Findings are suspicious for dissection. 2. Otherwise, no significant stenosis. Electronically Signed   By: Feliberto Harts M.D.   On: 05/08/2023 20:18   MR BRAIN WO CONTRAST Result Date:  05/08/2023 CLINICAL DATA:  Neuro deficit, acute, stroke suspected. Nausea and dizziness beginning today. EXAM: MRI HEAD WITHOUT CONTRAST TECHNIQUE: Multiplanar, multiecho pulse sequences of the brain and surrounding structures were obtained without intravenous contrast. COMPARISON:  MR head without and with contrast 01/21/2023 FINDINGS: Brain: No acute infarct, hemorrhage, or mass lesion is present. No significant white matter lesions are present. The ventricles are of normal size. Deep brain nuclei are within normal limits. No significant extraaxial fluid collection is present. The brainstem and cerebellum are within normal limits. Midline structures are within normal limits. Vascular: Flow is present in the major intracranial arteries. Skull and upper cervical spine: The craniocervical junction is normal. Upper cervical spine is within normal limits. Marrow signal is unremarkable. Sinuses/Orbits: Bilateral lens replacements are noted. Globes and orbits are otherwise unremarkable. The paranasal sinuses and mastoid air cells are clear. IMPRESSION: Negative MRI of the brain for age. No acute or focal lesion to explain the patient's symptoms. Electronically Signed   By: Marin Roberts M.D.   On: 05/08/2023 17:19    EKG: Independently reviewed. Sinus rhythm.   Assessment/Plan   1. Extracranial left vertebral artery dissection; disabling vertigo  - CTA suspicious for left vertebral artery dissection  - No acute findings noted on MRI brain but she has persistent and disabling vertigo  - Loaded with ASA and Plavix in ED  - Continue ASA 81 and Plavix 75 mg daily, continue Crestor, neuro checks, consult PT/OT  2. Type II DM  - A1c was 6.5% in November 2024  - Check CBGs and use low-intensity SSI for now      DVT prophylaxis: Lovenox  Code Status: Full  Level of Care: Level of care: Telemetry Medical Family Communication: None present  Disposition Plan:  Patient is from: Home  Anticipated d/c is  to: TBD Anticipated d/c date is: 1/27 or 05/10/23  Patient currently: Pending therapy evaluations, symptom-management  Consults called: ED discussed case with neurology  Admission status: Observation     Briscoe Deutscher, MD [facility]  05/09/2023, 12:59 AM

## 2023-05-09 NOTE — ED Notes (Signed)
Patient getting ready to leave with [facility] at this time

## 2023-05-09 NOTE — ED Notes (Signed)
Patient resting in bed.

## 2023-05-09 NOTE — ED Notes (Signed)
[facility] here to transport patient.

## 2023-05-09 NOTE — Progress Notes (Signed)
PROGRESS NOTE  DEANDRE STANSEL ZOX:[id number] DOB: 1946-04-07 DOA: 05/08/2023 PCP: Octavia Heir, NP  Brief History   The patient is a 78 yr old woman who presented to [facility] with complaints of persistent dizziness/vertigo while shopping. The patient states that she has had episodes of dizziness in the past, but none were as sustained. In the ED she was found to have severe stenosis of the left vertebral artery with findings suspicious for dissection.   She carries a medical history significant for Sjogren's syndrome, hypertension, hyperlipidemia, DM II, and glaucoma.  She is awaiting transfer to [facility].  The patient states that she is feeling better than at presentation. She has no new complaints other than dryness of her eyes related to her Sjogren's. She is give natural tears to have at bedside.  A & P  Extracranial left vertebral artery dissection; disabling vertigo  - CTA suspicious for left vertebral artery dissection  - No acute findings noted on MRI brain but she has persistent and disabling vertigo  - Loaded with ASA and Plavix in ED  - Continue ASA 81 and Plavix 75 mg daily, continue Crestor, neuro checks, consult PT/OT   Type II DM  - A1c was 6.5% in November 2024  - Check CBGs and use low-intensity SSI for now    - Metformin is held as the patient is likely to require IV contrast dye in coming days  Hypertension The patient is normotensive without antihypertensives.  Sjogren's Noted. The patient is given natural tears for her eye dryness.  Glaucoma The patient has been continued on Azopt 1% ophthalmic suspension.  DVT prophylaxis: Lovenox  Code Status: Full  Level of Care: Level of care: Telemetry Medical Family Communication: None present  Disposition Plan:  Patient is from: Home  Anticipated d/c is to: TBD Anticipated d/c date is: 1/27 or 05/10/23  Patient currently: Pending therapy evaluations, symptom-management  Consults called: ED discussed case with neurology   Admission status: Observation    The patient has been seen and examined by me. I have spent 34 minutes in the evaluation and care of this patient.  Bluford Sedler, DO [facility] Direct contact: see [email or web address]  7PM-7AM contact night coverage as above 05/09/2023, 5:32 PM  LOS: 0 days   Consultants  Neurology  Procedures  None  Interval History/Subjective  The patient is resting comfortably. No new complaints.  Objective   Vitals:  Vitals:   05/09/23 1549 05/09/23 1700  BP:  (!) 116/57  Pulse:  75  Resp:  16  Temp: 98.3 F (36.8 C)   SpO2:  97%    Exam:  Constitutional:  The patient is awake, alert, and oriented x 3. No acute distress. Respiratory:  No increased work of breathing. No wheezes, rales, or rhonchi No tactile fremitus Cardiovascular:  Regular rate and rhythm No murmurs, ectopy, or gallups. No lateral PMI. No thrills. Abdomen:  Abdomen is soft, non-tender, non-distended No hernias, masses, or organomegaly Normoactive bowel sounds.  Musculoskeletal:  No cyanosis, clubbing, or edema Skin:  No rashes, lesions, ulcers palpation of skin: no induration or nodules Neurologic:  CN 2-12 intact Sensation all 4 extremities intact Psychiatric:  Mental status Mood, affect appropriate Orientation to person, place, time  judgment and insight appear intact   I have personally reviewed the following:   Today's Data   Vitals:   05/09/23 1549 05/09/23 1700  BP:  (!) 116/57  Pulse:  75  Resp:  16  Temp: 98.3 F (36.8 C)  SpO2:  97%     Lab Data  CBC    Component Value Date/Time   WBC 9.2 05/09/2023 0949   RBC 4.59 05/09/2023 0949   HGB 14.0 05/09/2023 0949   HCT 43.0 05/09/2023 0949   PLT 230 05/09/2023 0949   MCV 93.7 05/09/2023 0949   MCH 30.5 05/09/2023 0949   MCHC 32.6 05/09/2023 0949   RDW 13.1 05/09/2023 0949   RDW 13.9 10/30/2013 0805   LYMPHSABS 1.5 05/08/2023 1348   LYMPHSABS 2.2 10/30/2013 0805   MONOABS 0.6  05/08/2023 1348   EOSABS 0.3 05/08/2023 1348   EOSABS 0.7 (H) 10/30/2013 0805   BASOSABS 0.1 05/08/2023 1348   BASOSABS 0.1 10/30/2013 0805      Latest Ref Rng & Units 05/09/2023    9:49 AM 05/08/2023    1:48 PM 03/03/2023   10:27 AM  BMP  Glucose 70 - 99 mg/dL 284  132  440   BUN 8 - 23 mg/dL 11  11  12    Creatinine 0.44 - 1.00 mg/dL 1.02  7.25  3.66   BUN/Creat Ratio 6 - 22 (calc)   SEE NOTE:   Sodium 135 - 145 mmol/L 138  133  134   Potassium 3.5 - 5.1 mmol/L 3.4  3.8  4.5   Chloride 98 - 111 mmol/L 105  103  100   CO2 22 - 32 mmol/L 24  22  24    Calcium 8.9 - 10.3 mg/dL 8.7  8.5  9.7     Imaging  MRI brain CTA head and neck  Scheduled Meds:  [START ON 05/10/2023]  stroke: early stages of recovery book   Does not apply Once   aspirin EC  81 mg Oral Daily   brinzolamide  1 drop Both Eyes Q12H   clopidogrel  75 mg Oral Daily   enoxaparin (LOVENOX) injection  40 mg Subcutaneous Q24H   insulin aspart  0-5 Units Subcutaneous QHS   insulin aspart  0-6 Units Subcutaneous TID WC   pantoprazole  40 mg Oral Daily   rosuvastatin  10 mg Oral Daily   Continuous Infusions:  Principal Problem:   Vertebral artery dissection (HCC) Active Problems:   Sjogren's syndrome (HCC)   Controlled type 2 diabetes mellitus without complication, without long-term current use of insulin (HCC)   Vertigo   LOS: 0 days

## 2023-05-10 ENCOUNTER — Observation Stay (HOSPITAL_COMMUNITY): Payer: Medicare HMO

## 2023-05-10 ENCOUNTER — Other Ambulatory Visit (HOSPITAL_COMMUNITY): Payer: Self-pay

## 2023-05-10 ENCOUNTER — Observation Stay (HOSPITAL_BASED_OUTPATIENT_CLINIC_OR_DEPARTMENT_OTHER): Payer: Medicare HMO

## 2023-05-10 DIAGNOSIS — I639 Cerebral infarction, unspecified: Secondary | ICD-10-CM

## 2023-05-10 DIAGNOSIS — R9431 Abnormal electrocardiogram [ECG] [EKG]: Secondary | ICD-10-CM | POA: Diagnosis not present

## 2023-05-10 DIAGNOSIS — G459 Transient cerebral ischemic attack, unspecified: Secondary | ICD-10-CM

## 2023-05-10 DIAGNOSIS — I7774 Dissection of vertebral artery: Secondary | ICD-10-CM | POA: Diagnosis not present

## 2023-05-10 LAB — BASIC METABOLIC PANEL
Anion gap: 8 (ref 5–15)
BUN: 9 mg/dL (ref 8–23)
CO2: 23 mmol/L (ref 22–32)
Calcium: 9 mg/dL (ref 8.9–10.3)
Chloride: 104 mmol/L (ref 98–111)
Creatinine, Ser: 0.87 mg/dL (ref 0.44–1.00)
GFR, Estimated: >60 mL/min (ref >60)
Glucose, Bld: 117 mg/dL — ABNORMAL HIGH (ref 70–99)
Potassium: 4 mmol/L (ref 3.5–5.1)
Sodium: 135 mmol/L (ref 135–145)

## 2023-05-10 LAB — ECHOCARDIOGRAM COMPLETE
Area-P 1/2: 3.48 cm2
Calc EF: 66.6 %
Height: 66 in
S' Lateral: 2.1 cm
Single Plane A2C EF: 69.3 %
Single Plane A4C EF: 65.8 %
Weight: 2821.89 [oz_av]

## 2023-05-10 LAB — CBC WITH DIFFERENTIAL/PLATELET
Abs Immature Granulocytes: 0.02 10*3/uL (ref 0.00–0.07)
Basophils Absolute: 0.1 10*3/uL (ref 0.0–0.1)
Basophils Relative: 1 %
Eosinophils Absolute: 0.5 10*3/uL (ref 0.0–0.5)
Eosinophils Relative: 5 %
HCT: 40.3 % (ref 36.0–46.0)
Hemoglobin: 13.5 g/dL (ref 12.0–15.0)
Immature Granulocytes: 0 %
Lymphocytes Relative: 22 %
Lymphs Abs: 2.1 10*3/uL (ref 0.7–4.0)
MCH: 30.8 pg (ref 26.0–34.0)
MCHC: 33.5 g/dL (ref 30.0–36.0)
MCV: 91.8 fL (ref 80.0–100.0)
Monocytes Absolute: 0.9 10*3/uL (ref 0.1–1.0)
Monocytes Relative: 9 %
Neutro Abs: 6 10*3/uL (ref 1.7–7.7)
Neutrophils Relative %: 63 %
Platelets: 216 10*3/uL (ref 150–400)
RBC: 4.39 MIL/uL (ref 3.87–5.11)
RDW: 13.2 % (ref 11.5–15.5)
WBC: 9.6 10*3/uL (ref 4.0–10.5)
nRBC: 0 % (ref 0.0–0.2)

## 2023-05-10 LAB — GLUCOSE, CAPILLARY
Glucose-Capillary: 111 mg/dL — ABNORMAL HIGH (ref 70–99)
Glucose-Capillary: 192 mg/dL — ABNORMAL HIGH (ref 70–99)

## 2023-05-10 LAB — MRSA NEXT GEN BY PCR, NASAL: MRSA by PCR Next Gen: NOT DETECTED

## 2023-05-10 MED ORDER — METOPROLOL SUCCINATE ER 25 MG PO TB24
25.0000 mg | ORAL_TABLET | Freq: Every day | ORAL | Status: DC
  Administered 2023-05-10: 25 mg via ORAL
  Filled 2023-05-10: qty 1

## 2023-05-10 MED ORDER — METFORMIN HCL ER 500 MG PO TB24: 500.0000 mg | ORAL_TABLET | Freq: Every day | ORAL | Status: DC

## 2023-05-10 MED ORDER — CLOPIDOGREL BISULFATE 75 MG PO TABS
75.0000 mg | ORAL_TABLET | Freq: Every day | ORAL | 0 refills | Status: DC
  Filled 2023-05-10: qty 90, 90d supply, fill #0

## 2023-05-10 NOTE — Evaluation (Signed)
Physical Therapy Evaluation Patient Details Name: Felicia Hall MRN: [id number] DOB: 1945-05-09 Today's Date: 05/10/2023  History of Present Illness  78 y.o. female presents to [facility] hospital on  Clinical Impression  Pt presents to PT at or near her baseline. Pt denies dizziness or any focal neurological symptoms at this time. Pt does report discomfort at her back and knees but this is similar to her chronic OA related pain. Pt is mobilizing independently and completes lower body dressing and toileting unassisted.  Pt has no current PT needs. PT signing off.       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 Restrictions Weight Bearing Restrictions Per Provider Order: No      Mobility  Bed Mobility Overal bed mobility: Independent                  Transfers Overall transfer level: Independent                      Ambulation/Gait Ambulation/Gait assistance: Independent Gait Distance (Feet): 400 Feet Assistive device: None Gait Pattern/deviations: WFL(Within Functional Limits) Gait velocity: functional Gait velocity interpretation: 1.31 - 2.62 ft/sec, indicative of limited community ambulator   General Gait Details: steady step-through gait, no reports of dizziness with head turns or changes in direction  Stairs            Wheelchair Mobility     Tilt Bed    Modified Rankin (Stroke Patients Only) Modified Rankin (Stroke Patients Only) Pre-Morbid Rankin Score: No symptoms Modified Rankin: No significant disability     Balance Overall balance assessment:  Independent                                           Pertinent Vitals/Pain Pain Assessment Pain Assessment: Faces Faces Pain Scale: Hurts little more Pain Location: back Pain Descriptors / Indicators: Grimacing Pain Intervention(s): Monitored during session    Home Living Family/patient expects to be discharged to:: Private residence Living Arrangements: Alone Available Help at Discharge: Friend(s);Available PRN/intermittently Type of Home: House Home Access: Level entry       Home Layout: One level Home Equipment: Cane - single point;Shower seat      Prior Function Prior Level of Function : Independent/Modified Independent;Driving             Mobility Comments: PRN use of cane due to OA       Extremity/Trunk Assessment   Upper Extremity Assessment Upper Extremity Assessment: Overall WFL for tasks assessed    Lower Extremity Assessment Lower Extremity Assessment: Overall WFL for tasks assessed    Cervical / Trunk Assessment Cervical / Trunk Assessment: Normal  Communication   Communication Communication: No apparent difficulties Cueing Techniques: Verbal cues  Cognition Arousal: Alert Behavior During Therapy: WFL for tasks assessed/performed Overall Cognitive Status: Within Functional Limits for tasks assessed  General Comments General comments (skin integrity, edema, etc.): VSS on RA    Exercises     Assessment/Plan    PT Assessment Patient does not need any further PT services  PT Problem List         PT Treatment Interventions      PT Goals (Current goals can be found in the Care Plan section)       Frequency       Co-evaluation               AM-PAC PT "6 Clicks" Mobility  Outcome Measure Help needed turning from your back to your side while in a flat bed without using bedrails?: None Help needed moving from lying on your back to sitting on the side  of a flat bed without using bedrails?: None Help needed moving to and from a bed to a chair (including a wheelchair)?: None Help needed standing up from a chair using your arms (e.g., wheelchair or bedside chair)?: None Help needed to walk in hospital room?: None Help needed climbing 3-5 steps with a railing? : None 6 Click Score: 24    End of Session   Activity Tolerance: Patient tolerated treatment well Patient left: in bed;with call bell/phone within reach Nurse Communication: Mobility status PT Visit Diagnosis: Other symptoms and signs involving the nervous system (R29.898)    Time: 4098-1191 PT Time Calculation (min) (ACUTE ONLY): 21 min   Charges:   PT Evaluation $PT Eval Low Complexity: 1 Low   PT General Charges $$ ACUTE PT VISIT: 1 Visit         Arlyss Gandy, PT, DPT Acute Rehabilitation Office [phone]   Arlyss Gandy 05/10/2023, 11:53 AM

## 2023-05-10 NOTE — Progress Notes (Signed)
  Echocardiogram 2D Echocardiogram has been performed.  Felicia Hall 05/10/2023, 9:51 AM

## 2023-05-10 NOTE — Discharge Summary (Signed)
Physician Discharge Summary  Felicia Hall UEA:[id number] DOB: 1946-02-09 DOA: 05/08/2023  PCP: Octavia Heir, NP  Admit date: 05/08/2023 Discharge date: 05/10/2023  Admitted From: (Home) Disposition:  (Home   Recommendations for Outpatient Follow-up:  Follow up with PCP in 1-2 weeks Please obtain BMP/CBC in one week Ambulatory referral has been made to [facility] neurology, will need repeat CTA head and neck in 2 months  Home Health: (NO)  Diet recommendation: Heart Healthy  Brief/Interim Summary: Felicia Hall is a 78 y.o. female with medical history significant for Sjogren's syndrome, hypertension, hyperlipidemia, type 2 diabetes mellitus, and glaucoma who presents with dizziness.   Patient was in her usual state of health today when she went shopping but developed dizziness while in the store.  patient is found to be afebrile and saturating well on room air with normal heart rate and stable blood pressure.  Labs are most notable for normal renal function, WBC 11,000, normal hemoglobin, negative respiratory virus panel, undetectable troponin, and unremarkable UA.  There were no acute findings on MRI brain.  CTA of the head and neck demonstrates severe stenosis of the left vertebral artery with findings suspicious for dissection.  Seen by neurology who recommended admission for further workup.    Extracranial left vertebral artery dissection; disabling vertigo  - CTA suspicious for left vertebral artery dissection  - No acute findings noted on MRI brain but she has persistent and disabling vertigo  - Loaded with ASA and Plavix in ED  -Neurology input greatly appreciated, mentation for aspirin and Plavix x 90 days and aspirin alone, patient had recent chiropractor visit with neck manipulation, she was recommended to avoid further chiropractor Neck manipulations, to avoid rapid neck movements and hyperextension of the neck  -She was seen by PT, symptoms has resolved, no home health or further  recommendations   Type II DM  - A1c was 6.5% in November 2024   Hypertension Resume home medications   Sjogren's Noted. The patient is given natural tears for her eye dryness.   Glaucoma The patient has been continued on Azopt 1% ophthalmic suspension.    Discharge Diagnoses:  Principal Problem:   Vertebral artery dissection (HCC) Active Problems:   Sjogren's syndrome (HCC)   Controlled type 2 diabetes mellitus without complication, without long-term current use of insulin [facility])   Vertigo    Discharge Instructions  Discharge Instructions     Ambulatory referral to Neurology   Complete by: As directed    An appointment is requested in approximately: 4 weeks   Diet - low sodium heart healthy   Complete by: As directed    Discharge instructions   Complete by: As directed    Increase activity slowly   Complete by: As directed       Allergies as of 05/10/2023       Reactions   Statins Other (See Comments)   Lovastatin Muscle problems   Allopurinol Rash   Darvocet [propoxyphene N-acetaminophen] Rash        Medication List     STOP taking these medications    estradiol 0.1 MG/GM vaginal cream Commonly known as: ESTRACE       TAKE these medications    acetaminophen 650 MG CR tablet Commonly known as: TYLENOL Take 1,300 mg by mouth every 8 (eight) hours as needed for pain.   aspirin EC 81 MG tablet Take 1 tablet (81 mg total) by mouth daily. Swallow whole.   brinzolamide 1 % ophthalmic suspension Commonly known  as: AZOPT 1 drop every 12 (twelve) hours.   chlorpheniramine 4 MG tablet Commonly known as: CHLOR-TRIMETON Take 4 mg by mouth daily.   cholecalciferol 1000 units tablet Commonly known as: VITAMIN D Take 2,000 Units by mouth daily.   clopidogrel 75 MG tablet Commonly known as: PLAVIX Take 1 tablet (75 mg total) by mouth daily. Start taking on: May 11, 2023   ezetimibe 10 MG tablet Commonly known as: ZETIA TAKE 1 TABLET BY  MOUTH EVERY DAY   losartan 100 MG tablet Commonly known as: COZAAR TAKE 1 TABLET BY MOUTH EVERY DAY   metFORMIN 500 MG 24 hr tablet Commonly known as: GLUCOPHAGE-XR Take 1 tablet (500 mg total) by mouth daily with breakfast. Start taking on: May 13, 2023 What changed: These instructions start on May 13, 2023. If you are unsure what to do until then, ask your doctor or other care provider.   metoprolol succinate 25 MG 24 hr tablet Commonly known as: TOPROL-XL TAKE 1 TABLET (25 MG TOTAL) BY MOUTH DAILY.   pantoprazole 40 MG tablet Commonly known as: PROTONIX TAKE 1 TABLET BY MOUTH EVERY DAY   rosuvastatin 10 MG tablet Commonly known as: CRESTOR TAKE 1 TABLET BY MOUTH EVERY DAY        Follow-up Information     Fargo, Amy E, NP Follow up.   Specialty: Adult Health Nurse Practitioner Contact information: [address] [place] [address] [phone]                Allergies  Allergen Reactions   Statins Other (See Comments)    Lovastatin Muscle problems   Allopurinol Rash   Darvocet [Propoxyphene N-Acetaminophen] Rash    Consultations: Neurology   Procedures/Studies: ECHOCARDIOGRAM COMPLETE Result Date: 05/10/2023    ECHOCARDIOGRAM REPORT   Patient Name:   Felicia Hall [facility] Date of Exam: 05/10/2023 Medical Rec #:  [id number]      Height:       66.0 in Accession #:    [id number]     Weight:       176.4 lb Date of Birth:  1945-05-31      BSA:          1.896 m Patient Age:    77 years       BP:           116/73 mmHg Patient Gender: F              HR:           73 bpm. Exam Location:  Inpatient Procedure: 2D Echo, Cardiac Doppler and Color Doppler Indications:     Stroke  History:         Patient has prior history of Echocardiogram examinations, most                  recent 04/30/2021. Abnormal ECG; Risk Factors:Diabetes and                  Dyslipidemia.  Sonographer:     Sheralyn Boatman RDCS Referring Phys:  [id number] [facility] Diagnosing Phys: Freida Busman  McleanMD IMPRESSIONS  1. Left ventricular ejection fraction, by estimation, is 65 to 70%. The left ventricle has normal function. The left ventricle has no regional wall motion abnormalities. Left ventricular diastolic parameters are consistent with Grade I diastolic dysfunction (impaired relaxation).  2. Right ventricular systolic function is normal. The right ventricular size is normal. Tricuspid regurgitation signal is inadequate for assessing PA pressure.  3. The mitral  valve is normal in structure. Mild mitral valve regurgitation. No evidence of mitral stenosis.  4. The aortic valve is tricuspid. There is mild calcification of the aortic valve. Aortic valve regurgitation is not visualized. No aortic stenosis is present.  5. Aortic dilatation noted. There is mild dilatation of the ascending aorta, measuring 40 mm.  6. The inferior vena cava is normal in size with greater than 50% respiratory variability, suggesting right atrial pressure of 3 mmHg.  7. Cannot rule out small PFO, consider confirming with bubble study. FINDINGS  Left Ventricle: Left ventricular ejection fraction, by estimation, is 65 to 70%. The left ventricle has normal function. The left ventricle has no regional wall motion abnormalities. The left ventricular internal cavity size was normal in size. There is  no left ventricular hypertrophy. Left ventricular diastolic parameters are consistent with Grade I diastolic dysfunction (impaired relaxation). Right Ventricle: The right ventricular size is normal. No increase in right ventricular wall thickness. Right ventricular systolic function is normal. Tricuspid regurgitation signal is inadequate for assessing PA pressure. Left Atrium: Left atrial size was normal in size. Right Atrium: Right atrial size was normal in size. Pericardium: There is no evidence of pericardial effusion. Mitral Valve: The mitral valve is normal in structure. Mild mitral annular calcification. Mild mitral valve  regurgitation. No evidence of mitral valve stenosis. Tricuspid Valve: The tricuspid valve is normal in structure. Tricuspid valve regurgitation is not demonstrated. Aortic Valve: The aortic valve is tricuspid. There is mild calcification of the aortic valve. Aortic valve regurgitation is not visualized. No aortic stenosis is present. Pulmonic Valve: The pulmonic valve was normal in structure. Pulmonic valve regurgitation is not visualized. Aorta: The aortic root is normal in size and structure and aortic dilatation noted. There is mild dilatation of the ascending aorta, measuring 40 mm. Venous: The inferior vena cava is normal in size with greater than 50% respiratory variability, suggesting right atrial pressure of 3 mmHg. IAS/Shunts: Cannot rule out small PFO, consider confirming with bubble study.  LEFT VENTRICLE PLAX 2D LVIDd:         3.80 cm     Diastology LVIDs:         2.10 cm     LV e' medial:    5.33 cm/s LV PW:         1.10 cm     LV E/e' medial:  12.8 LV IVS:        1.10 cm     LV e' lateral:   8.16 cm/s LVOT diam:     2.10 cm     LV E/e' lateral: 8.4 LV SV:         76 LV SV Index:   40 LVOT Area:     3.46 cm  LV Volumes (MOD) LV vol d, MOD A2C: 56.6 ml LV vol d, MOD A4C: 74.2 ml LV vol s, MOD A2C: 17.4 ml LV vol s, MOD A4C: 25.4 ml LV SV MOD A2C:     39.2 ml LV SV MOD A4C:     74.2 ml LV SV MOD BP:      43.5 ml RIGHT VENTRICLE             IVC RV S prime:     11.50 cm/s  IVC diam: 1.70 cm TAPSE (M-mode): 2.5 cm LEFT ATRIUM             Index        RIGHT ATRIUM  Index LA diam:        3.90 cm 2.06 cm/m   RA Area:     10.80 cm LA Vol (A2C):   31.3 ml 16.51 ml/m  RA Volume:   16.30 ml  8.60 ml/m LA Vol (A4C):   24.7 ml 13.03 ml/m LA Biplane Vol: 29.7 ml 15.67 ml/m  AORTIC VALVE LVOT Vmax:   103.00 cm/s LVOT Vmean:  67.600 cm/s LVOT VTI:    0.220 m  AORTA Ao Root diam: 3.50 cm Ao Asc diam:  4.00 cm MITRAL VALVE MV Area (PHT): 3.48 cm     SHUNTS MV Decel Time: 218 msec     Systemic VTI:  0.22 m  MV E velocity: 68.30 cm/s   Systemic Diam: 2.10 cm MV A velocity: 106.00 cm/s MV E/A ratio:  0.64 Dalton McleanMD Electronically signed by Wilfred Lacy Signature Date/Time: 05/10/2023/9:54:47 AM    Final (Updated)    CT ANGIO HEAD NECK W WO CM Result Date: 05/08/2023 CLINICAL DATA:  Tinnitus, nonpulsatile, asymmetric or unilateral whooshing in left ear, off balance EXAM: CT ANGIOGRAPHY HEAD AND NECK WITH AND WITHOUT CONTRAST TECHNIQUE: Multidetector CT imaging of the head and neck was performed using the standard protocol during bolus administration of intravenous contrast. Multiplanar CT image reconstructions and MIPs were obtained to evaluate the vascular anatomy. Carotid stenosis measurements (when applicable) are obtained utilizing NASCET criteria, using the distal internal carotid diameter as the denominator. RADIATION DOSE REDUCTION: This exam was performed according to the departmental dose-optimization program which includes automated exposure control, adjustment of the mA and/or kV according to patient size and/or use of iterative reconstruction technique. CONTRAST:  75mL OMNIPAQUE IOHEXOL 350 MG/ML SOLN COMPARISON:  None Available. FINDINGS: CT HEAD FINDINGS Brain: No evidence of acute infarction, hemorrhage, hydrocephalus, extra-axial collection or mass lesion/mass effect. Vascular: See below. Skull: No acute fracture. Sinuses/Orbits: Clear sinuses.  No acute orbital findings. Other: No mastoid effusions. Review of the MIP images confirms the above findings CTA NECK FINDINGS Aortic arch: Calcific atherosclerosis. Great vessel origins are patent. Right carotid system: No evidence of dissection, stenosis (50% or greater), or occlusion. Left carotid system: No evidence of dissection, stenosis (50% or greater), or occlusion. Vertebral arteries: Right vertebral artery is patent without significant stenosis. Severe stenosis of the proximal left vertebral artery with suggestion of linear filling defect and  poor opacification distally. Skeleton: No acute abnormality on limited assessment. Severe multilevel degenerative change. Other neck: No acute abnormality on limited assessment. Upper chest: Biapical pleuroparenchymal scarring. Review of the MIP images confirms the above findings CTA HEAD FINDINGS Anterior circulation: Bilateral intracranial ICAs, MCAs, and ACAs are patent without proximal hemodynamically significant stenosis. Posterior circulation: Bilateral intradural vertebral arteries, basilar artery and bilateral posterior cerebral arteries are patent without proximal hemodynamically significant stenosis. Small vertebrobasilar system with bilateral posterior communicating arteries, anatomic variant. Venous sinuses: As permitted by contrast timing, patent. Review of the MIP images confirms the above findings IMPRESSION: 1. Severe stenosis of the left vertebral artery in the neck proximally with suggestion of a linear filling defect and poor opacification more distally. Findings are suspicious for dissection. 2. Otherwise, no significant stenosis. Electronically Signed   By: Feliberto Harts M.D.   On: 05/08/2023 20:18   MR BRAIN WO CONTRAST Result Date: 05/08/2023 CLINICAL DATA:  Neuro deficit, acute, stroke suspected. Nausea and dizziness beginning today. EXAM: MRI HEAD WITHOUT CONTRAST TECHNIQUE: Multiplanar, multiecho pulse sequences of the brain and surrounding structures were obtained without intravenous contrast. COMPARISON:  MR head  without and with contrast 01/21/2023 FINDINGS: Brain: No acute infarct, hemorrhage, or mass lesion is present. No significant white matter lesions are present. The ventricles are of normal size. Deep brain nuclei are within normal limits. No significant extraaxial fluid collection is present. The brainstem and cerebellum are within normal limits. Midline structures are within normal limits. Vascular: Flow is present in the major intracranial arteries. Skull and upper  cervical spine: The craniocervical junction is normal. Upper cervical spine is within normal limits. Marrow signal is unremarkable. Sinuses/Orbits: Bilateral lens replacements are noted. Globes and orbits are otherwise unremarkable. The paranasal sinuses and mastoid air cells are clear. IMPRESSION: Negative MRI of the brain for age. No acute or focal lesion to explain the patient's symptoms. Electronically Signed   By: Marin Roberts M.D.   On: 05/08/2023 17:19      Subjective:  Patient denies any complaints today, no further dizziness or lightheadedness, Discharge Exam: Vitals:   05/10/23 0418 05/10/23 0730  BP: 116/73 119/77  Pulse: 78 77  Resp: 15 13  Temp: 97.8 F (36.6 C)   SpO2: 97% 97%   Vitals:   05/09/23 2355 05/09/23 2356 05/10/23 0418 05/10/23 0730  BP: 119/70  116/73 119/77  Pulse:   78 77  Resp:   15 13  Temp:  97.7 F (36.5 C) 97.8 F (36.6 C)   TempSrc:  Oral Oral   SpO2:   97% 97%  Weight:      Height:        General: Pt is alert, awake, not in acute distress Cardiovascular: RRR, S1/S2 +, no rubs, no gallops Respiratory: CTA bilaterally, no wheezing, no rhonchi Abdominal: Soft, NT, ND, bowel sounds + Extremities: no edema, no cyanosis    The results of significant diagnostics from this hospitalization (including imaging, microbiology, ancillary and laboratory) are listed below for reference.     Microbiology: Recent Results (from the past 240 hours)  Resp panel by RT-PCR (RSV, Flu A&B, Covid) Anterior Nasal Swab     Status: None   Collection Time: 05/08/23  6:38 PM   Specimen: Anterior Nasal Swab  Result Value Ref Range Status   SARS Coronavirus 2 by RT PCR NEGATIVE NEGATIVE Final    Comment: (NOTE) SARS-CoV-2 target nucleic acids are NOT DETECTED.  The SARS-CoV-2 RNA is generally detectable in upper respiratory specimens during the acute phase of infection. The lowest concentration of SARS-CoV-2 viral copies this assay can detect is 138  copies/mL. A negative result does not preclude SARS-Cov-2 infection and should not be used as the sole basis for treatment or other patient management decisions. A negative result may occur with  improper specimen collection/handling, submission of specimen other than nasopharyngeal swab, presence of viral mutation(s) within the areas targeted by this assay, and inadequate number of viral copies(<138 copies/mL). A negative result must be combined with clinical observations, patient history, and epidemiological information. The expected result is Negative.  Fact Sheet for Patients:  BloggerCourse.com  Fact Sheet for Healthcare Providers:  SeriousBroker.it  This test is no t yet approved or cleared by the [place] FDA and  has been authorized for detection and/or diagnosis of SARS-CoV-2 by FDA under an Emergency Use Authorization (EUA). This EUA will remain  in effect (meaning this test can be used) for the duration of the COVID-19 declaration under Section 564(b)(1) of the Act, 21 U.S.C.section 360bbb-3(b)(1), unless the authorization is terminated  or revoked sooner.       Influenza A by PCR NEGATIVE NEGATIVE  Final   Influenza B by PCR NEGATIVE NEGATIVE Final    Comment: (NOTE) The Xpert Xpress SARS-CoV-2/FLU/RSV plus assay is intended as an aid in the diagnosis of influenza from Nasopharyngeal swab specimens and should not be used as a sole basis for treatment. Nasal washings and aspirates are unacceptable for Xpert Xpress SARS-CoV-2/FLU/RSV testing.  Fact Sheet for Patients: BloggerCourse.com  Fact Sheet for Healthcare Providers: SeriousBroker.it  This test is not yet approved or cleared by the [place] FDA and has been authorized for detection and/or diagnosis of SARS-CoV-2 by FDA under an Emergency Use Authorization (EUA). This EUA will remain in effect (meaning  this test can be used) for the duration of the COVID-19 declaration under Section 564(b)(1) of the Act, 21 U.S.C. section 360bbb-3(b)(1), unless the authorization is terminated or revoked.     Resp Syncytial Virus by PCR NEGATIVE NEGATIVE Final    Comment: (NOTE) Fact Sheet for Patients: BloggerCourse.com  Fact Sheet for Healthcare Providers: SeriousBroker.it  This test is not yet approved or cleared by the [place] FDA and has been authorized for detection and/or diagnosis of SARS-CoV-2 by FDA under an Emergency Use Authorization (EUA). This EUA will remain in effect (meaning this test can be used) for the duration of the COVID-19 declaration under Section 564(b)(1) of the Act, 21 U.S.C. section 360bbb-3(b)(1), unless the authorization is terminated or revoked.  Performed at [facility], [address], [place], [place] [address]   MRSA Next Gen by PCR, Nasal     Status: None   Collection Time: 05/09/23 10:23 PM   Specimen: Nasal Mucosa; Nasal Swab  Result Value Ref Range Status   MRSA by PCR Next Gen NOT DETECTED NOT DETECTED Final    Comment: (NOTE) The GeneXpert MRSA Assay (FDA approved for NASAL specimens only), is one component of a comprehensive MRSA colonization surveillance program. It is not intended to diagnose MRSA infection nor to guide or monitor treatment for MRSA infections. Test performance is not FDA approved in patients less than 70 years old. Performed at [facility], [address] [place], [place] [address]      Labs: BNP (last 3 results) No results for input(s): "BNP" in the last 8760 hours. Basic Metabolic Panel: Recent Labs  Lab 05/08/23 1348 05/09/23 0949 05/10/23 0423  NA 133* 138 135  K 3.8 3.4* 4.0  CL 103 105 104  CO2 22 24 23   GLUCOSE 141* 125* 117*  BUN 11 11 9   CREATININE 0.49 0.71 0.87  CALCIUM 8.5* 8.7* 9.0   Liver Function Tests: No results  for input(s): "AST", "ALT", "ALKPHOS", "BILITOT", "PROT", "ALBUMIN" in the last 168 hours. No results for input(s): "LIPASE", "AMYLASE" in the last 168 hours. No results for input(s): "AMMONIA" in the last 168 hours. CBC: Recent Labs  Lab 05/08/23 1348 05/09/23 0949 05/10/23 0423  WBC 11.0* 9.2 9.6  NEUTROABS 8.5*  --  6.0  HGB 13.9 14.0 13.5  HCT 42.1 43.0 40.3  MCV 94.4 93.7 91.8  PLT 224 230 216   Cardiac Enzymes: No results for input(s): "CKTOTAL", "CKMB", "CKMBINDEX", "TROPONINI" in the last 168 hours. BNP: Invalid input(s): "POCBNP" CBG: Recent Labs  Lab 05/09/23 1126 05/09/23 1632 05/09/23 2143 05/10/23 0806 05/10/23 1203  GLUCAP 224* 116* 151* 111* 192*   D-Dimer No results for input(s): "DDIMER" in the last 72 hours. Hgb A1c Recent Labs    05/09/23 0950  HGBA1C 6.3*   Lipid Profile Recent Labs    05/09/23  0950  CHOL 112  HDL 39*  LDLCALC 50  TRIG 454  CHOLHDL 2.9   Thyroid function studies No results for input(s): "TSH", "T4TOTAL", "T3FREE", "THYROIDAB" in the last 72 hours.  Invalid input(s): "FREET3" Anemia work up No results for input(s): "VITAMINB12", "FOLATE", "FERRITIN", "TIBC", "IRON", "RETICCTPCT" in the last 72 hours. Urinalysis    Component Value Date/Time   COLORURINE STRAW (A) 05/08/2023 2245   APPEARANCEUR CLEAR 05/08/2023 2245   APPEARANCEUR Clear 04/26/2013 0839   LABSPEC 1.030 05/08/2023 2245   PHURINE 7.0 05/08/2023 2245   GLUCOSEU NEGATIVE 05/08/2023 2245   HGBUR NEGATIVE 05/08/2023 2245   BILIRUBINUR NEGATIVE 05/08/2023 2245   BILIRUBINUR Negative 09/17/2021 1013   BILIRUBINUR Negative 04/26/2013 0839   KETONESUR NEGATIVE 05/08/2023 2245   PROTEINUR NEGATIVE 05/08/2023 2245   UROBILINOGEN 0.2 09/17/2021 1013   UROBILINOGEN 0.2 06/05/2014 2124   NITRITE NEGATIVE 05/08/2023 2245   LEUKOCYTESUR NEGATIVE 05/08/2023 2245   Sepsis Labs Recent Labs  Lab 05/08/23 1348 05/09/23 0949 05/10/23 0423  WBC 11.0* 9.2 9.6    Microbiology Recent Results (from the past 240 hours)  Resp panel by RT-PCR (RSV, Flu A&B, Covid) Anterior Nasal Swab     Status: None   Collection Time: 05/08/23  6:38 PM   Specimen: Anterior Nasal Swab  Result Value Ref Range Status   SARS Coronavirus 2 by RT PCR NEGATIVE NEGATIVE Final    Comment: (NOTE) SARS-CoV-2 target nucleic acids are NOT DETECTED.  The SARS-CoV-2 RNA is generally detectable in upper respiratory specimens during the acute phase of infection. The lowest concentration of SARS-CoV-2 viral copies this assay can detect is 138 copies/mL. A negative result does not preclude SARS-Cov-2 infection and should not be used as the sole basis for treatment or other patient management decisions. A negative result may occur with  improper specimen collection/handling, submission of specimen other than nasopharyngeal swab, presence of viral mutation(s) within the areas targeted by this assay, and inadequate number of viral copies(<138 copies/mL). A negative result must be combined with clinical observations, patient history, and epidemiological information. The expected result is Negative.  Fact Sheet for Patients:  BloggerCourse.com  Fact Sheet for Healthcare Providers:  SeriousBroker.it  This test is no t yet approved or cleared by the [place] FDA and  has been authorized for detection and/or diagnosis of SARS-CoV-2 by FDA under an Emergency Use Authorization (EUA). This EUA will remain  in effect (meaning this test can be used) for the duration of the COVID-19 declaration under Section 564(b)(1) of the Act, 21 U.S.C.section 360bbb-3(b)(1), unless the authorization is terminated  or revoked sooner.       Influenza A by PCR NEGATIVE NEGATIVE Final   Influenza B by PCR NEGATIVE NEGATIVE Final    Comment: (NOTE) The Xpert Xpress SARS-CoV-2/FLU/RSV plus assay is intended as an aid in the diagnosis of influenza  from Nasopharyngeal swab specimens and should not be used as a sole basis for treatment. Nasal washings and aspirates are unacceptable for Xpert Xpress SARS-CoV-2/FLU/RSV testing.  Fact Sheet for Patients: BloggerCourse.com  Fact Sheet for Healthcare Providers: SeriousBroker.it  This test is not yet approved or cleared by the [place] FDA and has been authorized for detection and/or diagnosis of SARS-CoV-2 by FDA under an Emergency Use Authorization (EUA). This EUA will remain in effect (meaning this test can be used) for the duration of the COVID-19 declaration under Section 564(b)(1) of the Act, 21 U.S.C. section 360bbb-3(b)(1), unless the authorization is terminated or revoked.  Resp Syncytial Virus by PCR NEGATIVE NEGATIVE Final    Comment: (NOTE) Fact Sheet for Patients: BloggerCourse.com  Fact Sheet for Healthcare Providers: SeriousBroker.it  This test is not yet approved or cleared by the [place] FDA and has been authorized for detection and/or diagnosis of SARS-CoV-2 by FDA under an Emergency Use Authorization (EUA). This EUA will remain in effect (meaning this test can be used) for the duration of the COVID-19 declaration under Section 564(b)(1) of the Act, 21 U.S.C. section 360bbb-3(b)(1), unless the authorization is terminated or revoked.  Performed at [facility], [address], [place], [place] [address]   MRSA Next Gen by PCR, Nasal     Status: None   Collection Time: 05/09/23 10:23 PM   Specimen: Nasal Mucosa; Nasal Swab  Result Value Ref Range Status   MRSA by PCR Next Gen NOT DETECTED NOT DETECTED Final    Comment: (NOTE) The GeneXpert MRSA Assay (FDA approved for NASAL specimens only), is one component of a comprehensive MRSA colonization surveillance program. It is not intended to diagnose MRSA infection nor to guide or  monitor treatment for MRSA infections. Test performance is not FDA approved in patients less than 109 years old. Performed at [facility], [address] [place], [place] [address]      Time coordinating discharge: Over 30 minutes  SIGNED:   Huey Bienenstock, MD  [facility] 05/10/2023, 2:42 PM Pager   If 7PM-7AM, please contact night-coverage [email or web address] Password [email or web address]

## 2023-05-10 NOTE — TOC Transition Note (Signed)
Transition of Care [facility]) - Discharge Note   Patient Details  Name: Felicia Hall MRN: [id number] Date of Birth: 1945/04/14  Transition of Care [facility]) CM/SW Contact:  Gordy Clement, RN Phone Number: 05/10/2023, 12:04 PM   Clinical Narrative:     Patient will DC to home today.  There are no recommendations for Home Health or DME  Patient has a PCP and is insured Family will transport home  No additional TOC needs          Patient Goals and CMS Choice            Discharge Placement                       Discharge Plan and Services Additional resources added to the After Visit Summary for                                       Social Drivers of Health (SDOH) Interventions SDOH Screenings   Food Insecurity: No Food Insecurity (05/09/2023)  Housing: Unknown (05/09/2023)  Transportation Needs: No Transportation Needs (05/09/2023)  Utilities: Not At Risk (05/09/2023)  Alcohol Screen: Low Risk  (07/01/2022)  Depression (PHQ2-9): Low Risk  (03/03/2023)  Financial Resource Strain: Low Risk  (02/28/2023)  Physical Activity: Inactive (02/28/2023)  Social Connections: Socially Isolated (05/09/2023)  Stress: No Stress Concern Present (02/28/2023)  Tobacco Use: Low Risk  (05/09/2023)     Readmission Risk Interventions     No data to display

## 2023-05-10 NOTE — Plan of Care (Signed)
  Problem: Coping: Goal: Ability to adjust to condition or change in health will improve Outcome: Progressing   Problem: Education: Goal: Knowledge of disease or condition will improve 05/10/2023 0546 by Azucena Cecil, RN Outcome: Progressing 05/10/2023 0341 by Azucena Cecil, RN Outcome: Progressing Goal: Knowledge of secondary prevention will improve (MUST DOCUMENT ALL) 05/10/2023 0546 by Azucena Cecil, RN Outcome: Progressing 05/10/2023 0341 by Azucena Cecil, RN Outcome: Progressing Goal: Knowledge of patient specific risk factors will improve (DELETE if not current risk factor) 05/10/2023 0546 by Azucena Cecil, RN Outcome: Progressing 05/10/2023 0341 by Azucena Cecil, RN Outcome: Progressing   Problem: Ischemic Stroke/TIA Tissue Perfusion: Goal: Complications of ischemic stroke/TIA will be minimized Outcome: Progressing   Problem: Coping: Goal: Will verbalize positive feelings about self Outcome: Progressing Goal: Will identify appropriate support needs Outcome: Progressing   Problem: Self-Care: Goal: Ability to participate in self-care as condition permits will improve Outcome: Progressing Goal: Verbalization of feelings and concerns over difficulty with self-care will improve Outcome: Progressing   Problem: Education: Goal: Knowledge of General Education information will improve Description: Including pain rating scale, medication(s)/side effects and non-pharmacologic comfort measures Outcome: Progressing   Problem: Clinical Measurements: Goal: Diagnostic test results will improve Outcome: Progressing   Problem: Coping: Goal: Level of anxiety will decrease Outcome: Progressing   Problem: Safety: Goal: Ability to remain free from injury will improve Outcome: Progressing

## 2023-05-10 NOTE — Consult Note (Signed)
NEUROLOGY CONSULT NOTE   Date of service: May 10, 2023 Patient Name: Felicia Hall MRN:  [id number] DOB:  22-Jul-1945 Chief Complaint: "dizziness, vertigo" Requesting Provider: Fran Lowes, DO  History of Present Illness  BREEAN NANNINI is a 78 y.o. female with hx of DM2, GERD, sjogrens syndrome, HLD, glaucoma, HTN who presented to [facility] with dizziness.  Patient was shopping in store when she developed dizziness with room spinning that have been persistent. They are worse when she is moving, improving when resting or staying still.  She did go to the chiropractor for neck manipulation on 05/05/23.  LKW: 1000 on 05/08/23 Modified rankin score: 0-Completely asymptomatic and back to baseline post- stroke IV Thrombolysis: not offered, outside window and no symptoms at this time. EVT: not offered, no LVO, too mild to treat.  NIHSS components Score: Comment  1a Level of Conscious 0[x]  1[]  2[]  3[]      1b LOC Questions 0[x]  1[]  2[]       1c LOC Commands 0[x]  1[]  2[]       2 Best Gaze 0[x]  1[]  2[]       3 Visual 0[]  1[x]  2[]  3[]    Tunneled vision,   4 Facial Palsy 0[x]  1[]  2[]  3[]      5a Motor Arm - left 0[x]  1[]  2[]  3[]  4[]  UN[]    5b Motor Arm - Right 0[x]  1[]  2[]  3[]  4[]  UN[]    6a Motor Leg - Left 0[x]  1[]  2[]  3[]  4[]  UN[]    6b Motor Leg - Right 0[x]  1[]  2[]  3[]  4[]  UN[]    7 Limb Ataxia 0[x]  1[]  2[]  3[]  UN[]     8 Sensory 0[x]  1[]  2[]  UN[]      9 Best Language 0[x]  1[]  2[]  3[]      10 Dysarthria 0[x]  1[]  2[]  UN[]      11 Extinct. and Inattention 0[x]  1[]  2[]       TOTAL: 0      ROS  Comprehensive ROS performed and pertinent positives documented in HPI   Past History   Past Medical History:  Diagnosis Date   Acute upper respiratory infections of unspecified site    Allergic rhinitis due to pollen    Arthritis    Benign essential hypertension    Bursitis    Cervical spondylosis without myelopathy    Cervicalgia    Controlled type 2 diabetes mellitus without complication, without  long-term current use of insulin (HCC) 10/01/2019   Diaphragmatic hernia without mention of obstruction or gangrene    Disorder of bone and cartilage, unspecified    Diverticulosis of colon (without mention of hemorrhage)    Encounter for long-term (current) use of other medications    GERD (gastroesophageal reflux disease)    Glaucoma    open angle   High blood sugar    History of bone density study    History of colonoscopy    History of echocardiogram    History of hiatal hernia    History of Papanicolaou smear of cervix    Hyperlipidemia LDL goal < 100    Hypertension    Keratoconjunctivitis sicca, 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 Randon Goldsmith   CHOLECYSTECTOMY  1993   Dr.Lindsey    DILATION AND CURETTAGE OF UTERUS     GALLBLADDER SURGERY     LUMBAR LAMINECTOMY/DECOMPRESSION MICRODISCECTOMY N/A 04/21/2017   Procedure: BILATERAL LUMBAR FOUR- LUMBAR FIVE AND LEFT LUMBAR FIVE- SACRAL ONE LAMINOTOMY, MEDIAL FACETECTOMY AND FORAMINOTOMIES;  Surgeon: Shirlean Kelly, MD;  Location: [facility] OR;  Service: Neurosurgery;  Laterality: N/A;   NASAL SEPTUM SURGERY      Family History: Family History  Problem Relation Age of Onset   Heart attack Mother    Breast cancer Mother    Heart failure Mother    Pancreatitis Father    Diabetes Brother    Heart disease Brother        By-pass surgery   Lupus Cousin     Social History  reports that she has never smoked. She has never  used smokeless tobacco. She reports current alcohol use. She reports that she does not use drugs.  Allergies  Allergen Reactions   Statins Other (See Comments)    Lovastatin Muscle problems   Allopurinol Rash   Darvocet [Propoxyphene N-Acetaminophen] Rash    Medications   Current Facility-Administered Medications:     stroke: early stages of recovery book, , Does not apply, Once, Opyd, Lavone Neri, MD   acetaminophen (TYLENOL) tablet 650 mg, 650 mg, Oral, Q4H PRN **OR** acetaminophen (TYLENOL) 160 MG/5ML solution 650 mg, 650 mg, Per Tube, Q4H PRN **OR** acetaminophen (TYLENOL) suppository 650 mg, 650 mg, Rectal, Q4H PRN, Opyd, Lavone Neri, MD   aspirin EC tablet 81 mg, 81 mg, Oral, Daily, Opyd, Lavone Neri, MD, 81 mg at 05/09/23 0932   brinzolamide (AZOPT) 1 % ophthalmic suspension 1 drop, 1 drop, Both Eyes, Q12H, Opyd, Lavone Neri, MD, 1 drop at 05/09/23 1005   clopidogrel (PLAVIX) tablet 75 mg, 75 mg, Oral, Daily, Opyd, Lavone Neri, MD, 75 mg at 05/09/23 1013   diazepam (VALIUM) tablet 2 mg, 2 mg, Oral, Q8H PRN, Opyd, Lavone Neri, MD   enoxaparin (LOVENOX) injection 40 mg, 40 mg, Subcutaneous, Q24H, Opyd, Lavone Neri, MD, 40 mg at 05/09/23 0932   ezetimibe (ZETIA) tablet 10 mg, 10 mg, Oral, Daily, Swayze, Ava, DO   insulin aspart (novoLOG) injection 0-5 Units, 0-5 Units, Subcutaneous, QHS, Opyd, Timothy S, MD   insulin aspart (novoLOG) injection 0-6 Units, 0-6 Units, Subcutaneous, TID WC, Opyd, Lavone Neri, MD, 2 Units at 05/09/23 1132   meclizine (ANTIVERT) tablet 25 mg, 25 mg, Oral, TID PRN, Opyd, Lavone Neri, MD   pantoprazole (PROTONIX) EC tablet 40 mg, 40 mg, Oral, Daily, Opyd, Lavone Neri, MD, 40 mg at 05/09/23 0932   polyvinyl alcohol (LIQUIFILM TEARS) 1.4 % ophthalmic solution 1 drop, 1 drop, Both Eyes, PRN, Swayze, Ava, DO   rosuvastatin (CRESTOR) tablet 10 mg, 10 mg, Oral, Daily, Opyd, Timothy S, MD, 10 mg at 05/09/23 1013   senna-docusate (Senokot-S) tablet 1 tablet, 1 tablet, Oral, QHS PRN,  Briscoe Deutscher, MD  Vitals   Vitals:   05/09/23 2249 05/09/23 2300 05/09/23 2355 05/09/23 2356  BP: 123/66  119/70   Pulse: 76 78    Resp: 11 18    Temp:    97.7 F (36.5 C)  TempSrc:    Oral  SpO2: 99%     Weight:  Height:        Body mass index is 28.47 kg/m.  Physical Exam   General: Laying comfortably in bed; in no acute distress.  HENT: Normal oropharynx and mucosa. Normal external appearance of ears and nose.  Neck: Supple, no pain or tenderness  CV: No JVD. No peripheral edema.  Pulmonary: Symmetric Chest rise. Normal respiratory effort.  Abdomen: Soft to touch, non-tender.  Ext: No cyanosis, edema, or deformity  Skin: No rash. Normal palpation of skin.   Musculoskeletal: Normal digits and nails by inspection. No clubbing.   Neurologic Examination  Mental status/Cognition: Alert, oriented to self, place, month and year, good attention.  Speech/language: Fluent, comprehension intact, object naming intact, repetition intact.  Cranial nerves:   CN II Pupils equal and reactive to light, tunneled vision which is from glaucoma and baseline for her.   CN III,IV,VI EOM intact, no gaze preference or deviation, no nystagmus    CN V normal sensation in V1, V2, and V3 segments bilaterally    CN VII no asymmetry, no nasolabial fold flattening    CN VIII normal hearing to speech    CN IX & X normal palatal elevation, no uvular deviation    CN XI 5/5 head turn and 5/5 shoulder shrug bilaterally    CN XII midline tongue protrusion    Motor:  Muscle bulk: normal, tone normal, pronator drift none tremor none Mvmt Root Nerve  Muscle Right Left Comments  SA C5/6 Ax Deltoid 5 5   EF C5/6 Mc Biceps 5 5   EE C6/7/8 Rad Triceps 5 5   WF C6/7 Med FCR     WE C7/8 PIN ECU     F Ab C8/T1 U ADM/FDI 5 5   HF L1/2/3 Fem Illopsoas 5 5   KE L2/3/4 Fem Quad 5 5   DF L4/5 D Peron Tib Ant 5 5   PF S1/2 Tibial Grc/Sol 5 5    Sensation:  Light touch Intact throughout   Pin prick     Temperature    Vibration   Proprioception    Coordination/Complex Motor:  - Finger to Nose intact BL - Heel to shin intact BL - Rapid alternating movement are normal - Gait: deferred.  Labs/Imaging/Neurodiagnostic studies   CBC:  Recent Labs  Lab Jun 05, 2023 1348 05/09/23 0949  WBC 11.0* 9.2  NEUTROABS 8.5*  --   HGB 13.9 14.0  HCT 42.1 43.0  MCV 94.4 93.7  PLT 224 230   Basic Metabolic Panel:  Lab Results  Component Value Date   NA 138 05/09/2023   K 3.4 (L) 05/09/2023   CO2 24 05/09/2023   GLUCOSE 125 (H) 05/09/2023   BUN 11 05/09/2023   CREATININE 0.71 05/09/2023   CALCIUM 8.7 (L) 05/09/2023   GFRNONAA >60 05/09/2023   GFRAA 96 06/09/2020   Lipid Panel:  Lab Results  Component Value Date   LDLCALC 50 05/09/2023   HgbA1c:  Lab Results  Component Value Date   HGBA1C 6.3 (H) 05/09/2023   Urine Drug Screen: No results found for: "LABOPIA", "COCAINSCRNUR", "LABBENZ", "AMPHETMU", "THCU", "LABBARB"  Alcohol Level No results found for: "ETH" INR No results found for: "INR" APTT No results found for: "APTT" AED levels: No results found for: "PHENYTOIN", "ZONISAMIDE", "LAMOTRIGINE", "LEVETIRACETA"  CT Head without contrast(Personally reviewed): CTH was negative for a large hypodensity concerning for a large territory infarct or hyperdensity concerning for an ICH  CT angio Head and Neck with contrast(Personally reviewed): 1. 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 Findings are suspicious for dissection. 2. Otherwise, no significant stenosis.  MRI Brain(Personally reviewed): Negative MRI of the brain for age. No acute or focal lesion to explain the patient's symptoms.  ASSESSMENT   DUSTINA SCOGGIN is a 78 y.o. female with hx of DM2, GERD, sjogrens syndrome, HLD, glaucoma, HTN who presented to [facility] with dizziness/vertigo. 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  Symptoms have largely improved. Suspect this was either vertebrobasilar insufficiency or a TIA. She recently saw a chiropractor.  I extensively counseled her on not to have any further chiropractic neck manipulations. Avoid rapid movements and hyperextension of the neck.  RECOMMENDATIONS  - Frequent Neuro checks per stroke unit protocol - Recommend obtaining TTE  - Recommend obtaining Lipid panel with LDL - Please start statin if LDL > 70 - Recommend HbA1c to evaluate for diabetes and how well it is controlled. - Antithrombotic - Aspirin 81mg  daily along with plavix 75mg  daily x 90 days, followed by Aspirin 81mg  daily alone. - Recommend DVT ppx - SBP goal - permissive hypertension first 24 h < 220/110. Held home meds.  - Recommend Telemetry monitoring for arrythmia - Recommend bedside swallow screen prior to PO intake. - Stroke education booklet - Recommend PT/OT/SLP consult - repeat CT Angio head and neck in 2 months to evaluate for stability of the dissection. - stroke team to follow.  ______________________________________________________________________    Welton Flakes, MD [facility]

## 2023-05-10 NOTE — Progress Notes (Addendum)
STROKE TEAM PROGRESS NOTE   BRIEF HPI [name] is a 78 y.o. female with PMH significant for Sojourn syndrome, hypertension, hyperlipidemia, DM2, glaucoma who presented with dizziness, being Off balance, no blurred vision, no nausea. CTA concerning for left vertebral artery dissection.  MRI negative for acute stroke. NIH on Admission: 1 for tunneled vision   INTERIM HISTORY/SUBJECTIVE  No acute neurological deficits.   Plan of care, assessment and follow-up needs thoroughly discussed with patient.  Counseled extensively to not have further chiropractic neck manipulations, and to avoid extreme rapid movements and hyperextension of the neck.   OBJECTIVE  CBC    Component Value Date/Time   WBC 9.6 05/10/2023 0423   RBC 4.39 05/10/2023 0423   HGB 13.5 05/10/2023 0423   HCT 40.3 05/10/2023 0423   PLT 216 05/10/2023 0423   MCV 91.8 05/10/2023 0423   MCH 30.8 05/10/2023 0423   MCHC 33.5 05/10/2023 0423   RDW 13.2 05/10/2023 0423   RDW 13.9 10/30/2013 0805   LYMPHSABS 2.1 05/10/2023 0423   LYMPHSABS 2.2 10/30/2013 0805   MONOABS 0.9 05/10/2023 0423   EOSABS 0.5 05/10/2023 0423   EOSABS 0.7 (H) 10/30/2013 0805   BASOSABS 0.1 05/10/2023 0423   BASOSABS 0.1 10/30/2013 0805    BMET    Component Value Date/Time   NA 135 05/10/2023 0423   NA 134 04/26/2022 0828   K 4.0 05/10/2023 0423   CL 104 05/10/2023 0423   CO2 23 05/10/2023 0423   GLUCOSE 117 (H) 05/10/2023 0423   BUN 9 05/10/2023 0423   BUN 12 04/26/2022 0828   CREATININE 0.87 05/10/2023 0423   CREATININE 0.70 03/03/2023 1027   CALCIUM 9.0 05/10/2023 0423   EGFR 89 03/03/2023 1027   EGFR 90 04/26/2022 0828   GFRNONAA >60 05/10/2023 0423   GFRNONAA 82 06/09/2020 0828    IMAGING past 24 hours No results found.  Vitals:   05/09/23 2355 05/09/23 2356 05/10/23 0418 05/10/23 0730  BP: 119/70  116/73 119/77  Pulse:   78 77  Resp:   15 13  Temp:  97.7 F (36.5 C) 97.8 F (36.6 C)   TempSrc:  Oral Oral    SpO2:   97% 97%  Weight:      Height:         PHYSICAL EXAM General:  Alert, well-nourished, well-developed patient in no acute distress Psych:  Mood and affect appropriate for situation CV: Regular rate and rhythm on monitor Respiratory:  Regular, unlabored respirations on room air GI: Abdomen soft and nontender   NEURO:  Mental Status: AA&Ox3, able to give clear and coherent history Speech/Language: speech is without dysarthria or aphasia.  Naming, repetition, fluency, and comprehension intact.  Cranial Nerves:  II: PERRL. Decreased peripheral vision at baseline d/t hx of glaucoma III, IV, VI: EOMI. Eyelids elevate symmetrically.  V: Sensation is intact to light touch and symmetrical to face.  VII: Face is symmetrical resting and smiling VIII: hearing intact to voice. IX, X: Palate elevates symmetrically. ZO:[id number] shrug 5/5. XII: tongue is midline without fasciculations. Motor: 5/5 strength to all muscle groups tested.  Tone: is normal and bulk is normal Sensation- Intact to light touch bilaterally. Extinction absent to light touch to DSS.   Coordination: FTN intact bilaterally, HKS: no ataxia in BLE.No drift.  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 Findings are suspicious for dissection.  Otherwise, no significant stenosis. Recommend repeat CT angio in 3 months, will be ordered by follow-up  outpatient neurology MRI:  Negative   2D Echo: LVEF 65-70%, grade 1 diastolic dysfunction, mild MVR  LDL 50 HgbA1c 6.3 VTE prophylaxis - lovenox aspirin 81 mg daily prior to admission, continue aspirin 81 mg daily and clopidogrel 75 mg daily for 3 months and then plavix alone. Therapy recommendations:  No follow up needed  Disposition:  home  Hypertension Home meds: Losartan 100 mg Stable SBP goal - permissive hypertension first 24 h < 220/110 then gradually normalize  Hyperlipidemia Home meds:  Crestor 10mg , Zetia 10mg , resumed in hospital LDL 50, goal < 70 High intensity statin not indicated due to LDL within goal Continue statin at discharge  Diabetes type II Home meds: Metformin 500 mg HgbA1c 6.3, goal < 7.0 CBGs SSI Recommend close follow-up with PCP for better DM control  Hospital day # 0   Pt seen by Neuro NP/APP and later by MD. Note/plan to be edited by MD as needed.    Lynnae January, DNP, AGACNP-BC [facility] Please use AMION for contact information & EPIC for messaging. STROKE MD NOTE ; I have personally obtained history,examined this patient, reviewed notes, independently viewed imaging studies, participated in medical decision making and plan of care.ROS completed by me personally and pertinent positives fully documented  I have made any additions or clarifications directly to the above note. Agree with note above.  Patient presented with sudden onset of dizziness and gait imbalance likely due to posterior circulation TIA from symptomatic proximal left vertebral artery stenosis.  Unclear as to whether this represent dissection due to recent chiropractic manipulation or atherosclerotic narrowing.  Recommend dual antiplatelet therapy aspirin and Plavix for 3 months followed by Plavix alone and aggressive risk factor modification.  Repeat CT angiogram in 2 to 3 months.  Follow-up as an outpatient stroke clinic in 2 months.  Greater than 50% time during  this 50-minute visit was spent in counseling and coordination of care and discussion patient care team and answering questions.  Discussed with Dr. Mertie Clause, MD Medical Director [facility] Pager: [phone] 05/10/2023 5:04 PM    To contact Stroke Continuity provider, please refer to [email or web address]. After hours, contact General Neurology

## 2023-05-10 NOTE — Plan of Care (Signed)
  Problem: Coping: Goal: Ability to adjust to condition or change in health will improve Outcome: Progressing   Problem: Education: Goal: Knowledge of disease or condition will improve Outcome: Progressing Goal: Knowledge of secondary prevention will improve (MUST DOCUMENT ALL) Outcome: Progressing Goal: Knowledge of patient specific risk factors will improve (DELETE if not current risk factor) Outcome: Progressing   Problem: Coping: Goal: Will verbalize positive feelings about self Outcome: Progressing Goal: Will identify appropriate support needs Outcome: Progressing   Problem: Self-Care: Goal: Ability to participate in self-care as condition permits will improve Outcome: Progressing Goal: Verbalization of feelings and concerns over difficulty with self-care will improve Outcome: Progressing   Problem: Education: Goal: Knowledge of General Education information will improve Description: Including pain rating scale, medication(s)/side effects and non-pharmacologic comfort measures Outcome: Progressing   Problem: Clinical Measurements: Goal: Diagnostic test results will improve Outcome: Progressing   Problem: Coping: Goal: Level of anxiety will decrease Outcome: Progressing   Problem: Safety: Goal: Ability to remain free from injury will improve Outcome: Progressing

## 2023-05-11 ENCOUNTER — Telehealth: Payer: Self-pay

## 2023-05-11 NOTE — Telephone Encounter (Signed)
Patient called stating she was in the hospital 1/29 due to a mini stroke. Patient also said her provider discontinued her estradiol medication. She wanted you to be informed.

## 2023-05-12 NOTE — Telephone Encounter (Signed)
Discussed with patient that the estrogen cream would still be safe to use but she reports she would rather be safe than sorry. She also wants to re-schedule her appointment.

## 2023-05-16 ENCOUNTER — Telehealth: Payer: Self-pay | Admitting: Neurology

## 2023-05-16 ENCOUNTER — Encounter: Payer: Self-pay | Admitting: *Deleted

## 2023-05-16 DIAGNOSIS — M9904 Segmental and somatic dysfunction of sacral region: Secondary | ICD-10-CM | POA: Diagnosis not present

## 2023-05-16 DIAGNOSIS — M9905 Segmental and somatic dysfunction of pelvic region: Secondary | ICD-10-CM | POA: Diagnosis not present

## 2023-05-16 DIAGNOSIS — M9903 Segmental and somatic dysfunction of lumbar region: Secondary | ICD-10-CM | POA: Diagnosis not present

## 2023-05-16 DIAGNOSIS — M5136 Other intervertebral disc degeneration, lumbar region with discogenic back pain only: Secondary | ICD-10-CM | POA: Diagnosis not present

## 2023-05-16 NOTE — Telephone Encounter (Signed)
Pt said saw Dr. Pearlean Brownie while in the hospital for TIA. Have a trip planned and Dr. Pearlean Brownie advised not to go on the trip. Asking Dr.Sethi to write a letter stating medical reason can not go on the trip. Need letter to file a claim with the [employer] and the airline insurance.

## 2023-05-16 NOTE — Telephone Encounter (Signed)
Spoke to pt per Casey,RN ok to write letter for pt for [employer] . Pt will be here this afternoon to pick letter up . Letter placed on Dr Terrace Arabia (MD on call)  desk to sign

## 2023-05-26 DIAGNOSIS — M9904 Segmental and somatic dysfunction of sacral region: Secondary | ICD-10-CM | POA: Diagnosis not present

## 2023-05-26 DIAGNOSIS — M9903 Segmental and somatic dysfunction of lumbar region: Secondary | ICD-10-CM | POA: Diagnosis not present

## 2023-05-26 DIAGNOSIS — M5136 Other intervertebral disc degeneration, lumbar region with discogenic back pain only: Secondary | ICD-10-CM | POA: Diagnosis not present

## 2023-05-26 DIAGNOSIS — M9905 Segmental and somatic dysfunction of pelvic region: Secondary | ICD-10-CM | POA: Diagnosis not present

## 2023-06-06 DIAGNOSIS — M5136 Other intervertebral disc degeneration, lumbar region with discogenic back pain only: Secondary | ICD-10-CM | POA: Diagnosis not present

## 2023-06-06 DIAGNOSIS — M9904 Segmental and somatic dysfunction of sacral region: Secondary | ICD-10-CM | POA: Diagnosis not present

## 2023-06-06 DIAGNOSIS — M9903 Segmental and somatic dysfunction of lumbar region: Secondary | ICD-10-CM | POA: Diagnosis not present

## 2023-06-06 DIAGNOSIS — M9905 Segmental and somatic dysfunction of pelvic region: Secondary | ICD-10-CM | POA: Diagnosis not present

## 2023-06-13 ENCOUNTER — Encounter: Payer: Self-pay | Admitting: Orthopedic Surgery

## 2023-06-13 DIAGNOSIS — Z1211 Encounter for screening for malignant neoplasm of colon: Secondary | ICD-10-CM

## 2023-06-16 DIAGNOSIS — M9905 Segmental and somatic dysfunction of pelvic region: Secondary | ICD-10-CM | POA: Diagnosis not present

## 2023-06-16 DIAGNOSIS — M9903 Segmental and somatic dysfunction of lumbar region: Secondary | ICD-10-CM | POA: Diagnosis not present

## 2023-06-16 DIAGNOSIS — M9904 Segmental and somatic dysfunction of sacral region: Secondary | ICD-10-CM | POA: Diagnosis not present

## 2023-06-16 DIAGNOSIS — M5136 Other intervertebral disc degeneration, lumbar region with discogenic back pain only: Secondary | ICD-10-CM | POA: Diagnosis not present

## 2023-06-27 DIAGNOSIS — M9902 Segmental and somatic dysfunction of thoracic region: Secondary | ICD-10-CM | POA: Diagnosis not present

## 2023-06-27 DIAGNOSIS — M5134 Other intervertebral disc degeneration, thoracic region: Secondary | ICD-10-CM | POA: Diagnosis not present

## 2023-07-04 DIAGNOSIS — Z1211 Encounter for screening for malignant neoplasm of colon: Secondary | ICD-10-CM | POA: Diagnosis not present

## 2023-07-05 DIAGNOSIS — M9902 Segmental and somatic dysfunction of thoracic region: Secondary | ICD-10-CM | POA: Diagnosis not present

## 2023-07-05 DIAGNOSIS — M5134 Other intervertebral disc degeneration, thoracic region: Secondary | ICD-10-CM | POA: Diagnosis not present

## 2023-07-06 DIAGNOSIS — H401132 Primary open-angle glaucoma, bilateral, moderate stage: Secondary | ICD-10-CM | POA: Diagnosis not present

## 2023-07-06 LAB — HM DIABETES EYE EXAM

## 2023-07-07 ENCOUNTER — Ambulatory Visit (INDEPENDENT_AMBULATORY_CARE_PROVIDER_SITE_OTHER): Payer: Medicare Other | Admitting: Orthopedic Surgery

## 2023-07-07 ENCOUNTER — Encounter: Payer: Self-pay | Admitting: Orthopedic Surgery

## 2023-07-07 VITALS — BP 124/88 | HR 53 | Temp 96.7°F | Resp 16 | Ht 66.0 in | Wt 170.6 lb

## 2023-07-07 DIAGNOSIS — Z0000 Encounter for general adult medical examination without abnormal findings: Secondary | ICD-10-CM | POA: Diagnosis not present

## 2023-07-07 LAB — COLOGUARD: COLOGUARD: NEGATIVE

## 2023-07-07 NOTE — Progress Notes (Signed)
 Subjective:   Felicia Hall is a 78 y.o. female who presents for Medicare Annual (Subsequent) preventive examination.  Visit Complete: In person  Patient Medicare AWV questionnaire was completed by the patient on 07/07/2023; I have confirmed that all information answered by patient is correct and no changes since this date.  Cardiac Risk Factors include: advanced age (>50men, >29 women);diabetes mellitus;hypertension     Objective:    Today's Vitals   07/07/23 1026  BP: 124/88  Pulse: (!) 53  Resp: 16  Temp: (!) 96.7 F (35.9 C)  SpO2: 99%  Weight: 170 lb 9.6 oz (77.4 kg)  Height: 5\' 6"  (1.676 m)   Body mass index is 27.54 kg/m.     07/07/2023   10:32 AM 05/08/2023   11:44 AM 03/03/2023   10:01 AM 09/30/2022   10:16 AM 07/01/2022   10:16 AM 06/17/2022   10:12 AM 03/11/2022    8:29 AM  Advanced Directives  Does Patient Have a Medical Advance Directive? Yes No Yes Yes Yes Yes Yes  Type of Estate agent of [place];Living will  Healthcare Power of [place];Living will Healthcare Power of [place];Living will Healthcare Power of [place];Living will Healthcare Power of [place];Living will Healthcare Power of [place];Living will  Does patient want to make changes to medical advance directive? No - Patient declined  No - Patient declined No - Patient declined No - Patient declined No - Patient declined No - Patient declined  Copy of Healthcare Power of Attorney in Chart? Yes - validated most recent copy scanned in chart (See row information)  Yes - validated most recent copy scanned in chart (See row information) Yes - validated most recent copy scanned in chart (See row information) Yes - validated most recent copy scanned in chart (See row information) Yes - validated most recent copy scanned in chart (See row information) Yes - validated most recent copy scanned in chart (See row information)  Would patient like information on creating a medical advance  directive?  No - Patient declined         Current Medications (verified) Outpatient Encounter Medications as of 07/07/2023  Medication Sig   acetaminophen (TYLENOL) 650 MG CR tablet Take 1,300 mg by mouth every 8 (eight) hours as needed for pain.   aspirin EC 81 MG tablet Take 1 tablet (81 mg total) by mouth daily. Swallow whole.   brinzolamide (AZOPT) 1 % ophthalmic suspension 1 drop every 12 (twelve) hours.   chlorpheniramine (CHLOR-TRIMETON) 4 MG tablet Take 4 mg by mouth daily.   cholecalciferol (VITAMIN D) 1000 UNITS tablet Take 2,000 Units by mouth daily.    clopidogrel (PLAVIX) 75 MG tablet Take 1 tablet (75 mg total) by mouth daily.   ezetimibe (ZETIA) 10 MG tablet TAKE 1 TABLET BY MOUTH EVERY DAY   losartan (COZAAR) 100 MG tablet TAKE 1 TABLET BY MOUTH EVERY DAY   metFORMIN (GLUCOPHAGE-XR) 500 MG 24 hr tablet Take 1 tablet (500 mg total) by mouth daily with breakfast.   metoprolol succinate (TOPROL-XL) 25 MG 24 hr tablet TAKE 1 TABLET (25 MG TOTAL) BY MOUTH DAILY.   pantoprazole (PROTONIX) 40 MG tablet TAKE 1 TABLET BY MOUTH EVERY DAY   rosuvastatin (CRESTOR) 10 MG tablet TAKE 1 TABLET BY MOUTH EVERY DAY   No facility-administered encounter medications on file as of 07/07/2023.    Allergies (verified) Statins, Allopurinol, and Darvocet [propoxyphene n-acetaminophen]   History: Past Medical History:  Diagnosis Date   Acute upper respiratory infections of  unspecified site    Allergic rhinitis due to pollen    Arthritis    Benign essential hypertension    Bursitis    Cervical spondylosis without myelopathy    Cervicalgia    Controlled type 2 diabetes mellitus without complication, without long-term current use of insulin (HCC) 10/01/2019   Diaphragmatic hernia without mention of obstruction or gangrene    Disorder of bone and cartilage, unspecified    Diverticulosis of colon (without mention of hemorrhage)    Encounter for long-term (current) use of other medications     GERD (gastroesophageal reflux disease)    Glaucoma    open angle   High blood sugar    History of bone density study    History of colonoscopy    History of echocardiogram    History of hiatal hernia    History of Papanicolaou smear of cervix    Hyperlipidemia LDL goal < 100    Hypertension    Keratoconjunctivitis sicca, 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 Randon Goldsmith   CHOLECYSTECTOMY  1993   Dr.Lindsey    DILATION AND CURETTAGE OF UTERUS     GALLBLADDER SURGERY     LUMBAR LAMINECTOMY/DECOMPRESSION MICRODISCECTOMY N/A 04/21/2017   Procedure: BILATERAL LUMBAR FOUR- LUMBAR FIVE AND LEFT LUMBAR FIVE- SACRAL ONE LAMINOTOMY, MEDIAL FACETECTOMY AND FORAMINOTOMIES;  Surgeon: Shirlean Kelly, MD;  Location: [facility] OR;  Service: Neurosurgery;  Laterality: N/A;   NASAL SEPTUM SURGERY     Family History  Problem Relation Age of Onset   Heart attack Mother    Breast cancer Mother    Heart failure Mother    Pancreatitis Father    Diabetes Brother    Heart disease Brother        By-pass surgery   Lupus Cousin    Social History   Socioeconomic History   Marital status: Single    Spouse name: Not on file   Number of children: Not on file   Years of education: Not on file   Highest education level: Bachelor's degree (e.g., BA, AB, BS)  Occupational History   Occupation: Librarian, academic  Tobacco Use   Smoking status: Never   Smokeless tobacco: Never  Vaping Use   Vaping  status: Never Used  Substance and Sexual Activity   Alcohol use: Yes    Comment: occasionally   Drug use: No   Sexual activity: Not on file  Other Topics Concern   Not on file  Social History Narrative   Tobacco use, amount per day now: 0   Past tobacco use, amount per day: 0   How many years did you use tobacco: 0   Alcohol use (drinks per week): 0   Diet:   Do you drink/eat things with caffeine: Yes   Marital status:   Single  What year were you married?   Do you live in a house, apartment, assisted living, condo, trailer, etc.? House   Is it one or more stories? One   How many persons live in your home? 1   Do you have pets in your home?( please list) 0   Highest Level of education completed? College   Current or past profession: Librarian, academic.   Do you exercise?     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 2lbs weights 2-3 times week.   Do you have a living will? Yes    Do you have a DNR form?      No                             If not, do you want to discuss one? No   Do you have signed POA/HPOA forms?    No                    If so, please bring to you appointment      Do you have any difficulty bathing or dressing yourself? No   Do you have any difficulty preparing food or eating? No   Do you have any difficulty managing your medications? No   Do you have any difficulty managing your finances? No   Do you have any difficulty affording your medications? No   Social Drivers of Corporate investment banker Strain: Low Risk  (07/07/2023)   Overall Financial Resource Strain (CARDIA)    Difficulty of Paying Living Expenses: Not hard at all  Food Insecurity: No Food Insecurity (07/07/2023)   Hunger Vital Sign    Worried About Running Out of Food in the Last Year: Never true    Ran Out of Food in the Last Year: Never true  Transportation Needs: No Transportation Needs (07/07/2023)   PRAPARE - Therapist, art (Medical): No    Lack of Transportation (Non-Medical): No  Physical Activity: Sufficiently Active (07/07/2023)   Exercise Vital Sign    Days of Exercise per Week: 6 days    Minutes of Exercise per Session: 30 min  Stress: No Stress Concern Present (07/07/2023)   [employer] of Occupational Health - Occupational Stress Questionnaire    Feeling of Stress : Only a little  Social Connections: Socially Isolated (07/07/2023)   Social Connection and Isolation Panel [NHANES]    Frequency of Communication with Friends and Family: Never    Frequency of Social Gatherings with Friends and Family: Never    Attends Religious Services: Never    Diplomatic Services operational officer: No    Attends Engineer, structural: Never    Marital Status: Never married    Tobacco Counseling Counseling given: Not Answered   Clinical Intake:  Pre-visit preparation completed: Yes  Pain : No/denies pain     BMI - recorded: 27.54 Nutritional Status: BMI 25 -29 Overweight Nutritional Risks: None Diabetes: Yes CBG done?: No Did pt. bring in CBG monitor from home?: No  How often do you need to have someone help you when you read instructions, pamphlets, or other written materials from your doctor or pharmacy?: 1 - Never What is the last grade level you completed in school?:  college graduate  Interpreter Needed?: No      Activities of Daily Living    07/07/2023   10:52 AM 05/09/2023   10:20 PM  In your present state of health, do you have any difficulty performing the following activities:  Hearing? 0 0  Vision? 0 0  Difficulty concentrating or making decisions? 0 0  Walking or climbing stairs? 1   Comment knee arthtiris   Dressing or bathing? 0   Doing errands, shopping? 0   Preparing Food and eating ? N   Using the Toilet? N   In the past six months, have you accidently leaked urine? Y   Do you have problems with loss of bowel control? N   Managing your  Medications? N   Managing your Finances? N   Housekeeping or managing your Housekeeping? N     Patient Care Team: Octavia Heir, NP as PCP - General (Adult Health Nurse Practitioner) [name], MD as PCP - Cardiology (Cardiology) Antony Contras, MD as Consulting Physician (Ophthalmology) [facility]  Indicate any recent Medical Services you may have received from other than [facility] providers in the past year (date may be approximate).     Assessment:   This is a routine wellness examination for [place].  Hearing/Vision screen Hearing Screening - Comments:: Some hearing concerns. Patient complains of ringing in her ears in the past but ears are better. Vision Screening - Comments:: Some vision concerns. Patient last eye exam 07/06/2023. Patient wears OTC reading glasses.    Goals Addressed   None    Depression Screen    07/07/2023   10:55 AM 07/07/2023   10:29 AM 03/03/2023   12:29 PM 07/01/2022   10:33 AM 03/11/2022   10:39 AM 06/25/2021    9:29 AM 05/21/2021    9:30 AM  PHQ 2/9 Scores  PHQ - 2 Score 0 1 0 0 0 0 0    Fall Risk    07/07/2023   10:56 AM 07/07/2023   10:29 AM 03/03/2023   10:01 AM 09/30/2022   10:16 AM 07/01/2022   10:35 AM  Fall Risk   Falls in the past year? 0 0 0 0 0  Number falls in past yr: 0 0 0 0 0  Injury with Fall? 0 0 0 0 0  Risk for fall due to : History of fall(s);Impaired balance/gait No Fall Risks No Fall Risks No Fall Risks No Fall Risks  Follow up Falls evaluation completed;Education provided Falls evaluation completed Falls evaluation completed;Education provided;Falls prevention discussed Falls evaluation completed Falls evaluation completed;Education provided;Falls prevention discussed    MEDICARE RISK AT HOME: Medicare Risk at Home Any stairs in or around the home?: No If so, are there any without handrails?: No Home free of loose throw rugs in walkways, pet beds, electrical cords, etc?: Yes Adequate  lighting in your home to reduce risk of falls?: Yes Life alert?: Yes Use of a cane, walker or w/c?: Yes Grab bars in the bathroom?: Yes Shower chair or bench in shower?: Yes Elevated toilet seat or a handicapped toilet?: Yes  TIMED UP AND GO:  Was the test performed?  No    Cognitive Function:    07/01/2022   10:16 AM 06/25/2021   10:03 AM 09/12/2017    1:07 PM 08/06/2016    9:34 AM 05/09/2015    9:21 AM  MMSE - Mini Mental State Exam  Orientation to time 5 5 5 5 5   Orientation to Place 5  5 5 5 5   Registration 3 3 3 3 3   Attention/ Calculation 5 5 5 5 5   Recall 3 3 3 2 3   Language- name 2 objects 2 2 2 2 2   Language- repeat 1 1 1 1 1   Language- follow 3 step command 3 3 3 3 3   Language- read & follow direction 1 1 1 1 1   Write a sentence 1 1 1 1 1   Copy design 1 1 1 1 1   Total score 30 30 30 29 30         07/07/2023   10:32 AM 10/05/2019   11:08 AM 09/20/2018    1:32 PM  6CIT Screen  What Year? 0 points 0 points 0 points  What month? 0 points 0 points 0 points  What time? 0 points 0 points 0 points  Count back from 20 0 points 0 points 0 points  Months in reverse 0 points 0 points 0 points  Repeat phrase 0 points 0 points 0 points  Total Score 0 points 0 points 0 points    Immunizations Immunization History  Administered Date(s) Administered   Fluad Quad(high Dose 65+) 01/31/2020   Influenza, High Dose Seasonal PF 01/03/2017, 03/16/2018, 02/01/2019, 02/10/2021, 12/10/2022   Influenza,inj,Quad PF,6+ Mos 12/27/2014, 01/15/2016   Influenza-Unspecified 01/10/2008, 02/15/2011, 01/20/2012   PFIZER Comirnaty(Gray Top)Covid-19 Tri-Sucrose Vaccine 08/04/2020   PFIZER(Purple Top)SARS-COV-2 Vaccination 06/10/2019, 07/04/2019, 01/07/2020   Pfizer Covid-19 Vaccine Bivalent Booster 51yrs & up 01/15/2021, 12/29/2021   Pfizer(Comirnaty)Fall Seasonal Vaccine 12 years and older 01/28/2023   Pneumococcal Conjugate-13 11/01/2013   Pneumococcal Polysaccharide-23 09/14/2010, 09/12/2017    Respiratory Syncytial Virus Vaccine,Recomb Aduvanted(Arexvy) 07/02/2022   Tdap 05/23/2019   Zoster Recombinant(Shingrix) 04/07/2018, 09/28/2018   Zoster, Live 04/12/2005    TDAP status: Up to date  Flu Vaccine status: Up to date  Pneumococcal vaccine status: Up to date  Covid-19 vaccine status: Completed vaccines  Qualifies for Shingles Vaccine? Yes   Zostavax completed Yes   Shingrix Completed?: Yes  Screening Tests Health Maintenance  Topic Date Due   OPHTHALMOLOGY EXAM  08/11/2022   Diabetic kidney evaluation - Urine ACR  05/19/2023   COVID-19 Vaccine (8 - Pfizer risk 2024-25 season) 07/29/2023   HEMOGLOBIN A1C  11/06/2023   FOOT EXAM  03/02/2024   Diabetic kidney evaluation - eGFR measurement  05/09/2024   Medicare Annual Wellness (AWV)  07/06/2024   DTaP/Tdap/Td (2 - Td or Tdap) 05/22/2029   Pneumonia Vaccine 2+ Years old  Completed   INFLUENZA VACCINE  Completed   DEXA SCAN  Completed   Hepatitis C Screening  Completed   Zoster Vaccines- Shingrix  Completed   HPV VACCINES  Aged Out   Fecal DNA (Cologuard)  Discontinued    Health Maintenance  Health Maintenance Due  Topic Date Due   OPHTHALMOLOGY EXAM  08/11/2022   Diabetic kidney evaluation - Urine ACR  05/19/2023    Colorectal cancer screening: No longer required.   Mammogram status: Completed 12/2022. Repeat every year  Bone Density status: Completed 01/2023. Results reflect: Bone density results: OSTEOPENIA. Repeat every 2 years.  Lung Cancer Screening: (Low Dose CT Chest recommended if Age 64-80 years, 20 pack-year currently smoking OR have quit w/in 15years.) does not qualify.   Lung Cancer Screening Referral: No  Additional Screening:  Hepatitis C Screening: does not qualify; Completed   Vision Screening: Recommended annual ophthalmology exams for early detection of glaucoma and other disorders of the eye. Is the patient up to date with their annual  eye exam?  Yes  Who is the provider or  what is the name of the office in which the patient attends annual eye exams? Dr. Randon Goldsmith If pt is not established with a provider, would they like to be referred to a provider to establish care? No .   Dental Screening: Recommended annual dental exams for proper oral hygiene  Diabetic Foot Exam: Diabetic Foot Exam: Completed 07/07/2023  Community Resource Referral / Chronic Care Management: CRR required this visit?  No   CCM required this visit?  No     Plan:     I have personally reviewed and noted the following in the patient's chart:   Medical and social history Use of alcohol, tobacco or illicit drugs  Current medications and supplements including opioid prescriptions. Patient is not currently taking opioid prescriptions. Functional ability and status Nutritional status Physical activity Advanced directives List of other physicians Hospitalizations, surgeries, and ER visits in previous 12 months Vitals Screenings to include cognitive, depression, and falls Referrals and appointments  In addition, I have reviewed and discussed with patient certain preventive protocols, quality metrics, and best practice recommendations. A written personalized care plan for preventive services as well as general preventive health recommendations were provided to patient.     Octavia Heir, NP   07/07/2023   After Visit Summary: (MyChart) Due to this being a telephonic visit, the after visit summary with patients personalized plan was offered to patient via MyChart   Nurse Notes: CIT score 0, UTD on vaccinations. DEXA done fall 2024 at [facility]> osteopenia. Mammogram done 12/2022> normal.

## 2023-07-07 NOTE — Patient Instructions (Signed)
  Felicia Hall , Thank you for taking time to come for your Medicare Wellness Visit. I appreciate your ongoing commitment to your health goals. Please review the following plan we discussed and let me know if I can assist you in the future.   These are the goals we discussed:  Goals      Weight (lb) < 160 lb (72.6 kg)     Starting 08/07/2016 I would like to lose weight by snacking less.     Weight (lb) < 200 lb (90.7 kg)     I would like to loss weight      Weight (lb) < 200 lb (90.7 kg)        This is a list of the screening recommended for you and due dates:  Health Maintenance  Topic Date Due   Eye exam for diabetics  08/11/2022   Yearly kidney health urinalysis for diabetes  05/19/2023   COVID-19 Vaccine (8 - Pfizer risk 2024-25 season) 07/29/2023   Hemoglobin A1C  11/06/2023   Complete foot exam   03/02/2024   Yearly kidney function blood test for diabetes  05/09/2024   Medicare Annual Wellness Visit  07/06/2024   DTaP/Tdap/Td vaccine (2 - Td or Tdap) 05/22/2029   Pneumonia Vaccine  Completed   Flu Shot  Completed   DEXA scan (bone density measurement)  Completed   Hepatitis C Screening  Completed   Zoster (Shingles) Vaccine  Completed   HPV Vaccine  Aged Out   Cologuard (Stool DNA test)  Discontinued

## 2023-07-08 ENCOUNTER — Encounter: Payer: Self-pay | Admitting: Orthopedic Surgery

## 2023-07-12 ENCOUNTER — Encounter: Payer: Self-pay | Admitting: Neurology

## 2023-07-12 ENCOUNTER — Ambulatory Visit: Payer: Medicare Other | Admitting: Neurology

## 2023-07-12 VITALS — BP 110/76 | HR 70 | Ht 66.0 in | Wt 170.0 lb

## 2023-07-12 DIAGNOSIS — R42 Dizziness and giddiness: Secondary | ICD-10-CM | POA: Diagnosis not present

## 2023-07-12 DIAGNOSIS — I6502 Occlusion and stenosis of left vertebral artery: Secondary | ICD-10-CM

## 2023-07-12 NOTE — Patient Instructions (Signed)
 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 I also advised the patient to eat a healthy diet with plenty of whole grains, cereals, fruits and vegetables, exercise regularly and maintain ideal body weight check follow-up CT angiogram of the brain and neck.  Followup in the future with my nurse practitioner in 6 months or call earlier if necessary.  Stroke Prevention Some medical conditions and behaviors can lead to a higher chance of having a stroke. You can help prevent a stroke by eating healthy, exercising, not smoking, and managing any medical conditions you have. Stroke is a leading cause of functional impairment. Primary prevention is particularly important because a majority of strokes are first-time events. Stroke changes the lives of not only those who experience a stroke but also their family and other caregivers. How can this condition affect me? A stroke is a medical emergency and should be treated right away. A stroke can lead to brain damage and can sometimes be life-threatening. If a person gets medical treatment right away, there is a better chance of surviving and recovering from a stroke. What can increase my risk? The following medical conditions may increase your risk of a stroke: Cardiovascular disease. High blood pressure (hypertension). Diabetes. High cholesterol. Sickle cell disease. Blood clotting disorders (hypercoagulable state). Obesity. Sleep disorders (obstructive sleep  apnea). Other risk factors include: Being older than age 56. Having a history of blood clots, stroke, or mini-stroke (transient ischemic attack, TIA). Genetic factors, such as race, ethnicity, or a family history of stroke. Smoking cigarettes or using other tobacco products. Taking birth control pills, especially if you also use tobacco. Heavy use of alcohol or drugs, especially cocaine and methamphetamine. Physical inactivity. What actions can I take to prevent this? Manage your health conditions High cholesterol levels. Eating a healthy diet is important for preventing high cholesterol. If cholesterol cannot be managed through diet alone, you may need to take medicines. Take any prescribed medicines to control your cholesterol as told by your health care provider. Hypertension. To reduce your risk of stroke, try to keep your blood pressure below 130/80. Eating a healthy diet and exercising regularly are important for controlling blood pressure. If these steps are not enough to manage your blood pressure, you may need to take medicines. Take any prescribed medicines to control hypertension as told by your health care provider. Ask your health care provider if you should monitor your blood pressure at home. Have your blood pressure checked every year, even if your blood pressure is normal. Blood pressure increases with age and some medical conditions. Diabetes. Eating a healthy diet and exercising regularly are important parts of managing your blood sugar (glucose). If your blood sugar cannot be managed through diet and exercise, you may need to take medicines. Take any prescribed medicines to control your diabetes as told by your health care provider. Get evaluated for obstructive sleep apnea. Talk to your health care provider about getting a sleep evaluation if you snore a lot or have excessive sleepiness. Make sure that any other medical conditions you have,  such as atrial fibrillation or  atherosclerosis, are managed. Nutrition Follow instructions from your health care provider about what to eat or drink to help manage your health condition. These instructions may include: Reducing your daily calorie intake. Limiting how much salt (sodium) you use to 1,500 milligrams (mg) each day. Using only healthy fats for cooking, such as olive oil, canola oil, or sunflower oil. Eating healthy foods. You can do this by: Choosing foods that are high in fiber, such as whole grains, and fresh fruits and vegetables. Eating at least 5 servings of fruits and vegetables a day. Try to fill one-half of your plate with fruits and vegetables at each meal. Choosing lean protein foods, such as lean cuts of meat, poultry without skin, fish, tofu, beans, and nuts. Eating low-fat dairy products. Avoiding foods that are high in sodium. This can help lower blood pressure. Avoiding foods that have saturated fat, trans fat, and cholesterol. This can help prevent high cholesterol. Avoiding processed and prepared foods. Counting your daily carbohydrate intake.  Lifestyle If you drink alcohol: Limit how much you have to: 0-1 drink a day for women who are not pregnant. 0-2 drinks a day for men. Know how much alcohol is in your drink. In the [place] one drink equals one 12 oz bottle of beer ( ), one 5 oz glass of wine ( ), or one 1 oz glass of hard liquor (44mL). Do not use any products that contain nicotine or tobacco. These products include cigarettes, chewing tobacco, and vaping devices, such as e-cigarettes. If you need help quitting, ask your health care provider. Avoid secondhand smoke. Do not use drugs. Activity  Try to stay at a healthy weight. Get at least 30 minutes of exercise on most days, such as: Fast walking. Biking. Swimming. Medicines Take over-the-counter and prescription medicines only as told by your health care provider. Aspirin or blood thinners (antiplatelets or  anticoagulants) may be recommended to reduce your risk of forming blood clots that can lead to stroke. Avoid taking birth control pills. Talk to your health care provider about the risks of taking birth control pills if: You are over 68 years old. You smoke. You get very bad headaches. You have had a blood clot. Where to find more information American Stroke Association: www.strokeassociation.org Get help right away if: You or a loved one has any symptoms of a stroke. "BE FAST" is an easy way to remember the main warning signs of a stroke: B - Balance. Signs are dizziness, sudden trouble walking, or loss of balance. E - Eyes. Signs are trouble seeing or a sudden change in vision. F - Face. Signs are sudden weakness or numbness of the face, or the face or eyelid drooping on one side. A - Arms. Signs are weakness or numbness in an arm. This happens suddenly and usually on one side of the body. S - Speech. Signs are sudden trouble speaking, slurred speech, or trouble understanding what people say. T - Time. Time to call emergency services. Write down what time symptoms started. You or a loved one has other signs of a stroke, such as: A sudden, severe headache with no known cause. Nausea or vomiting. Seizure. These symptoms may represent a serious problem that is an emergency. Do not wait to see if the symptoms will go away. Get medical help right away. Call your local emergency services (911 in the [place] Do not drive yourself to the hospital. Summary You can help to prevent a stroke by eating healthy,  exercising, not smoking, limiting alcohol intake, and managing any medical conditions you may have. Do not use any products that contain nicotine or tobacco. These include cigarettes, chewing tobacco, and vaping devices, such as e-cigarettes. If you need help quitting, ask your health care provider. Remember "BE FAST" for warning signs of a stroke. Get help right away if you or a loved one has any  of these signs. This information is not intended to replace advice given to you by your health care provider. Make sure you discuss any questions you have with your health care provider. Document Revised: 03/01/2022 Document Reviewed: 03/01/2022 Elsevier Patient Education  2024 [employer].

## 2023-07-12 NOTE — Progress Notes (Signed)
 [facility] [address] [place]. [place] [address] [phone]       OFFICE FOLLOW-UP NOTE  Ms. DIANNIA HOGENSON Date of Birth:  1946/03/26 Medical Record Number:  [id number]   HPI: Mr. Felicia Hall is a pleasant 78 year old Caucasian lady seen today for initial office follow-up visit in hospital consultation for TIA in January 2025.  History is obtained from the patient and review of electronic medical records.  I personally reviewed pertinent imaging films in PACS.  She presented on 05/10/2023 with sudden onset of dizziness and room spinning sensation which appear to be worse when she was moving when improved with resting or staying still.  She did give history of going to a chiropractor for neck manipulation 5 days prior.  She denied any double vision loss of vision extremity weakness numbness or gait or balance problems.  CT head was negative for acute infarct.  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  MRI of the brain was negative for acute infarct.  2D echo showed ejection fraction of 65 to 70% with grade 1 diastolic dysfunction.  LDL cholesterol 50 mg percent.  Hemoglobin A1c was 6.3.  Patient was on aspirin prior to admission and she was switched to dual antiplatelet therapy of aspirin and Plavix for 3 months followed by Plavix alone.  Patient states she has done well since discharge.  She has had no more dizzy spells or vertigo.  She has had no other recurrent stroke or TIA symptoms.  She feels after addition of Plavix she is having some shortness of breath and feeling tired easily.  She states her blood pressure is under good control.  She states her sugars are also going well.  She is tolerating Crestor well without muscle aches and pains.  He has no new complaints.  ROS:   14 system review of systems is positive for  dizziness, vertigo all systems negative  PMH:  Past Medical History:  Diagnosis Date   Acute upper respiratory infections of unspecified site    Allergic rhinitis due to pollen    Arthritis    Benign essential hypertension    Bursitis    Cervical spondylosis without myelopathy    Cervicalgia    Controlled type 2 diabetes mellitus without complication, without long-term current use of insulin (HCC) 10/01/2019   Diaphragmatic hernia without mention of obstruction or gangrene    Disorder of bone and cartilage, unspecified    Diverticulosis of colon (without mention of hemorrhage)    Encounter for long-term (current) use of other medications    GERD (gastroesophageal reflux disease)    Glaucoma    open angle   High blood sugar    History of bone density study    History of colonoscopy    History of echocardiogram    History of hiatal hernia    History of Papanicolaou smear of cervix    Hyperlipidemia LDL goal < 100    Hypertension    Keratoconjunctivitis sicca, 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's    Other and unspecified hyperlipidemia    Pain in joint, site unspecified    Pre-diabetes    Routine general medical examination at a health care facility    Sinusitis    Sjogren's syndrome (HCC) 10/27/2012   Tear film insufficiency, unspecified    Unspecified disorder of kidney and ureter    Unspecified glaucoma(365.9)  Social History:  Social History   Socioeconomic History   Marital status: Single    Spouse name: Not on file   Number of children: Not on file   Years of education: Not on file   Highest education level: Bachelor's degree (e.g., BA, AB, BS)  Occupational History   Occupation: Librarian, academic  Tobacco Use   Smoking status: Never   Smokeless tobacco: Never  Vaping Use   Vaping status: Never Used  Substance and Sexual Activity   Alcohol use: Yes    Comment: occasionally   Drug use: No   Sexual activity: Not on file  Other Topics Concern   Not on file   Social History Narrative   Tobacco use, amount per day now: 0   Past tobacco use, amount per day: 0   How many years did you use tobacco: 0   Alcohol use (drinks per week): 0   Diet:   Do you drink/eat things with caffeine: Yes   Marital status:   Single                               What year were you married?   Do you live in a house, apartment, assisted living, condo, trailer, etc.? House   Is it one or more stories? One   How many persons live in your home? 1   Do you have pets in your home?( please list) 0   Highest Level of education completed? College   Current or past profession: Librarian, academic.   Do you exercise?     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 2lbs weights 2-3 times week.   Do you have a living will? Yes    Do you have a DNR form?      No                             If not, do you want to discuss one? No   Do you have signed POA/HPOA forms?    No                    If so, please bring to you appointment      Do you have any difficulty bathing or dressing yourself? No   Do you have any difficulty preparing food or eating? No   Do you have any difficulty managing your medications? No   Do you have any difficulty managing your finances? No   Do you have any difficulty affording your medications? No   Social Drivers of Corporate investment banker Strain: Low Risk  (07/07/2023)   Overall Financial Resource Strain (CARDIA)    Difficulty of Paying Living Expenses: Not hard at all  Food Insecurity: No Food Insecurity (07/07/2023)   Hunger Vital Sign    Worried About Running Out of Food in the Last Year: Never true    Ran Out of Food in the Last Year: Never true  Transportation Needs: No Transportation Needs (07/07/2023)   PRAPARE - Administrator, Civil Service (Medical): No    Lack of Transportation (Non-Medical): No  Physical Activity: Sufficiently Active (07/07/2023)   Exercise Vital Sign    Days of Exercise  per  Week: 6 days    Minutes of Exercise per Session: 30 min  Stress: No Stress Concern Present (07/07/2023)   [employer] of Occupational Health - Occupational Stress Questionnaire    Feeling of Stress : Only a little  Social Connections: Socially Isolated (07/07/2023)   Social Connection and Isolation Panel [NHANES]    Frequency of Communication with Friends and Family: Never    Frequency of Social Gatherings with Friends and Family: Never    Attends Religious Services: Never    Database administrator or Organizations: No    Attends Banker Meetings: Never    Marital Status: Never married  Intimate Partner Violence: Not At Risk (07/07/2023)   Humiliation, Afraid, Rape, and Kick questionnaire    Fear of Current or Ex-Partner: No    Emotionally Abused: No    Physically Abused: No    Sexually Abused: No    Medications:   Current Outpatient Medications on File Prior to Visit  Medication Sig Dispense Refill   acetaminophen (TYLENOL) 650 MG CR tablet Take 1,300 mg by mouth every 8 (eight) hours as needed for pain.     aspirin EC 81 MG tablet Take 1 tablet (81 mg total) by mouth daily. Swallow whole. 30 tablet 11   brinzolamide (AZOPT) 1 % ophthalmic suspension 1 drop every 12 (twelve) hours.     chlorpheniramine (CHLOR-TRIMETON) 4 MG tablet Take 4 mg by mouth daily.     cholecalciferol (VITAMIN D) 1000 UNITS tablet Take 2,000 Units by mouth daily.      clopidogrel (PLAVIX) 75 MG tablet Take 1 tablet (75 mg total) by mouth daily. 90 tablet 0   ezetimibe (ZETIA) 10 MG tablet TAKE 1 TABLET BY MOUTH EVERY DAY 90 tablet 2   losartan (COZAAR) 100 MG tablet TAKE 1 TABLET BY MOUTH EVERY DAY 90 tablet 1   metFORMIN (GLUCOPHAGE-XR) 500 MG 24 hr tablet Take 1 tablet (500 mg total) by mouth daily with breakfast.     metoprolol succinate (TOPROL-XL) 25 MG 24 hr tablet TAKE 1 TABLET (25 MG TOTAL) BY MOUTH DAILY. 90 tablet 2   pantoprazole (PROTONIX) 40 MG tablet TAKE 1 TABLET BY MOUTH  EVERY DAY 90 tablet 3   rosuvastatin (CRESTOR) 10 MG tablet TAKE 1 TABLET BY MOUTH EVERY DAY 90 tablet 1   No current facility-administered medications on file prior to visit.    Allergies:   Allergies  Allergen Reactions   Statins Other (See Comments)    Lovastatin Muscle problems   Allopurinol Rash   Darvocet [Propoxyphene N-Acetaminophen] Rash    Physical Exam General: well developed, well nourished, seated, in no evident distress Head: head normocephalic and atraumatic.  Neck: supple with no carotid or supraclavicular bruits Cardiovascular: regular rate and rhythm, no murmurs Musculoskeletal: no deformity Skin:  no rash/petichiae Vascular:  Normal pulses all extremities Vitals:   07/12/23 0950  BP: 110/76  Pulse: 70   Neurologic Exam Mental Status: Awake and fully alert. Oriented to place and time. Recent and remote memory intact. Attention span, concentration and fund of knowledge appropriate. Mood and affect appropriate.  Cranial Nerves: Fundoscopic exam reveals sharp disc margins. Pupils equal, briskly reactive to light. Extraocular movements full without nystagmus. Visual fields full to confrontation. Hearing intact. Facial sensation intact. Face, tongue, palate moves normally and symmetrically.  Motor: Normal bulk and tone. Normal strength in all tested extremity muscles. Sensory.: intact to touch ,pinprick .position and vibratory sensation.  Coordination: Rapid alternating movements normal in  all extremities. Finger-to-nose and heel-to-shin performed accurately bilaterally. Gait and Station: Arises from chair without difficulty. Stance is normal. Gait demonstrates normal stride length and balance . Able to heel, toe and tandem walk without difficulty.  Reflexes: 1+ and symmetric. Toes downgoing.   NIHSS  0 Modified Rankin  1   ASSESSMENT: 78 year old Caucasian lady with sudden onset of dizziness and vertigo secondary to left vertebral artery high-grade stenosis  possibly from dissection versus atherosclerosis.  Risk factors of diabetes, hypertension, hyperlipidemia vertebral artery dissection versus atherosclerosis   PLAN: I had a long d/w patient about her recent episodes of dizziness with vertebral artery stenosis  risk for recurrent stroke/TIAs, personally independently reviewed imaging studies and stroke evaluation results and answered questions.Continue aspirin 81 mg daily and clopidogrel 75 mg daily  for secondary stroke prevention for 3 months and then stay on aspirin alone and maintain strict control of hypertension with blood pressure goal below 130/90, diabetes with hemoglobin A1c goal below 6.5% and lipids with LDL cholesterol goal below 70 mg/dL. I also advised the patient to eat a healthy diet with plenty of whole grains, cereals, fruits and vegetables, exercise regularly and maintain ideal body weight check follow-up CT angiogram of the brain and neck.  Followup in the future with my nurse practitioner in 6 months or call earlier if necessary. Greater than 50% of time during this 35 minute visit was spent on counseling,explanation of diagnosis dizziness and vertebral artery stenosis, planning of further management, discussion with patient and family and coordination of care Delia Heady, MD Note: This document was prepared with digital dictation and possible smart phrase technology. Any transcriptional errors that result from this process are unintentional

## 2023-07-13 ENCOUNTER — Telehealth: Payer: Self-pay | Admitting: Neurology

## 2023-07-13 NOTE — Telephone Encounter (Signed)
 sent to GI they obtain [facility] Harvey [phone]

## 2023-07-14 DIAGNOSIS — M9902 Segmental and somatic dysfunction of thoracic region: Secondary | ICD-10-CM | POA: Diagnosis not present

## 2023-07-14 DIAGNOSIS — M5134 Other intervertebral disc degeneration, thoracic region: Secondary | ICD-10-CM | POA: Diagnosis not present

## 2023-07-18 ENCOUNTER — Encounter: Payer: Self-pay | Admitting: Neurology

## 2023-07-21 ENCOUNTER — Encounter: Payer: Self-pay | Admitting: Radiology

## 2023-07-21 ENCOUNTER — Ambulatory Visit: Payer: Medicare HMO | Admitting: Obstetrics and Gynecology

## 2023-07-21 ENCOUNTER — Ambulatory Visit
Admission: RE | Admit: 2023-07-21 | Discharge: 2023-07-21 | Disposition: A | Discharge status: Home / Self Care | Source: Ambulatory Visit | Attending: Neurology | Admitting: Neurology

## 2023-07-21 MED ORDER — IOPAMIDOL (ISOVUE-370) INJECTION 76%
75.0000 mL | Freq: Once | INTRAVENOUS | Status: AC | PRN
  Administered 2023-07-21: 75 mL via INTRAVENOUS

## 2023-07-25 DIAGNOSIS — M9902 Segmental and somatic dysfunction of thoracic region: Secondary | ICD-10-CM | POA: Diagnosis not present

## 2023-07-25 DIAGNOSIS — M5134 Other intervertebral disc degeneration, thoracic region: Secondary | ICD-10-CM | POA: Diagnosis not present

## 2023-07-29 ENCOUNTER — Other Ambulatory Visit: Payer: Self-pay | Admitting: Cardiology

## 2023-08-02 ENCOUNTER — Telehealth: Payer: Self-pay | Admitting: Neurology

## 2023-08-02 NOTE — Telephone Encounter (Signed)
Pt is asking for a call with results to CT scan

## 2023-08-02 NOTE — Telephone Encounter (Signed)
 As of this moment, the results have not been read and posted for Dr Janett Medin to review. Once the results are posted, Dr Janett Medin will review and result for the pt and we will reach out.

## 2023-08-04 DIAGNOSIS — M5134 Other intervertebral disc degeneration, thoracic region: Secondary | ICD-10-CM | POA: Diagnosis not present

## 2023-08-04 DIAGNOSIS — M9902 Segmental and somatic dysfunction of thoracic region: Secondary | ICD-10-CM | POA: Diagnosis not present

## 2023-08-09 ENCOUNTER — Other Ambulatory Visit (HOSPITAL_BASED_OUTPATIENT_CLINIC_OR_DEPARTMENT_OTHER): Payer: Self-pay

## 2023-08-15 DIAGNOSIS — M9902 Segmental and somatic dysfunction of thoracic region: Secondary | ICD-10-CM | POA: Diagnosis not present

## 2023-08-15 DIAGNOSIS — M5134 Other intervertebral disc degeneration, thoracic region: Secondary | ICD-10-CM | POA: Diagnosis not present

## 2023-08-18 NOTE — Telephone Encounter (Signed)
 Pt called to check on CT Scan results. Would like a call back.

## 2023-08-19 ENCOUNTER — Other Ambulatory Visit: Payer: Self-pay | Admitting: Orthopedic Surgery

## 2023-08-19 DIAGNOSIS — I10 Essential (primary) hypertension: Secondary | ICD-10-CM

## 2023-08-19 DIAGNOSIS — E1169 Type 2 diabetes mellitus with other specified complication: Secondary | ICD-10-CM

## 2023-08-19 DIAGNOSIS — R7303 Prediabetes: Secondary | ICD-10-CM

## 2023-08-25 DIAGNOSIS — M9902 Segmental and somatic dysfunction of thoracic region: Secondary | ICD-10-CM | POA: Diagnosis not present

## 2023-08-25 DIAGNOSIS — M5134 Other intervertebral disc degeneration, thoracic region: Secondary | ICD-10-CM | POA: Diagnosis not present

## 2023-08-27 ENCOUNTER — Ambulatory Visit: Payer: Self-pay | Admitting: Neurology

## 2023-08-27 NOTE — Progress Notes (Signed)
 Kindly inform the patient that CT angiogram study of the blood vessels of the head and the neck shows stable appearance of the narrowing of the vertebral artery to also back on the left side in the neck but no new blockages to worry about

## 2023-08-30 ENCOUNTER — Other Ambulatory Visit: Payer: Medicare Other

## 2023-08-30 ENCOUNTER — Other Ambulatory Visit (HOSPITAL_BASED_OUTPATIENT_CLINIC_OR_DEPARTMENT_OTHER): Payer: Self-pay

## 2023-08-30 DIAGNOSIS — E1169 Type 2 diabetes mellitus with other specified complication: Secondary | ICD-10-CM | POA: Diagnosis not present

## 2023-08-30 DIAGNOSIS — I10 Essential (primary) hypertension: Secondary | ICD-10-CM

## 2023-08-30 DIAGNOSIS — E785 Hyperlipidemia, unspecified: Secondary | ICD-10-CM | POA: Diagnosis not present

## 2023-08-30 DIAGNOSIS — R7303 Prediabetes: Secondary | ICD-10-CM

## 2023-08-31 ENCOUNTER — Ambulatory Visit: Payer: Self-pay | Admitting: Orthopedic Surgery

## 2023-08-31 LAB — COMPLETE METABOLIC PANEL WITHOUT GFR
AG Ratio: 1.7 (calc) (ref 1.0–2.5)
ALT: 16 U/L (ref 6–29)
AST: 16 U/L (ref 10–35)
Albumin: 4.4 g/dL (ref 3.6–5.1)
Alkaline phosphatase (APISO): 49 U/L (ref 37–153)
BUN: 11 mg/dL (ref 7–25)
CO2: 26 mmol/L (ref 20–32)
Calcium: 9.8 mg/dL (ref 8.6–10.4)
Chloride: 100 mmol/L (ref 98–110)
Creat: 0.74 mg/dL (ref 0.60–1.00)
Globulin: 2.6 g/dL (ref 1.9–3.7)
Glucose, Bld: 113 mg/dL — ABNORMAL HIGH (ref 65–99)
Potassium: 4.6 mmol/L (ref 3.5–5.3)
Sodium: 136 mmol/L (ref 135–146)
Total Bilirubin: 0.9 mg/dL (ref 0.2–1.2)
Total Protein: 7 g/dL (ref 6.1–8.1)

## 2023-08-31 LAB — CBC WITH DIFFERENTIAL/PLATELET
Absolute Lymphocytes: 1755 {cells}/uL (ref 850–3900)
Absolute Monocytes: 630 {cells}/uL (ref 200–950)
Basophils Absolute: 98 {cells}/uL (ref 0–200)
Basophils Relative: 1.3 %
Eosinophils Absolute: 398 {cells}/uL (ref 15–500)
Eosinophils Relative: 5.3 %
HCT: 46.1 % — ABNORMAL HIGH (ref 35.0–45.0)
Hemoglobin: 14.9 g/dL (ref 11.7–15.5)
MCH: 30.4 pg (ref 27.0–33.0)
MCHC: 32.3 g/dL (ref 32.0–36.0)
MCV: 94.1 fL (ref 80.0–100.0)
MPV: 10.2 fL (ref 7.5–12.5)
Monocytes Relative: 8.4 %
Neutro Abs: 4620 {cells}/uL (ref 1500–7800)
Neutrophils Relative %: 61.6 %
Platelets: 269 10*3/uL (ref 140–400)
RBC: 4.9 10*6/uL (ref 3.80–5.10)
RDW: 12.2 % (ref 11.0–15.0)
Total Lymphocyte: 23.4 %
WBC: 7.5 10*3/uL (ref 3.8–10.8)

## 2023-08-31 LAB — LIPID PANEL
Cholesterol: 129 mg/dL (ref <200)
HDL: 42 mg/dL — ABNORMAL LOW (ref >=50)
LDL Cholesterol (Calc): 64 mg/dL
Non-HDL Cholesterol (Calc): 87 mg/dL (ref <130)
Total CHOL/HDL Ratio: 3.1 (calc) (ref <5.0)
Triglycerides: 155 mg/dL — ABNORMAL HIGH (ref <150)

## 2023-08-31 LAB — HEMOGLOBIN A1C
Hgb A1c MFr Bld: 6.4 % — ABNORMAL HIGH (ref <5.7)
Mean Plasma Glucose: 137 mg/dL
eAG (mmol/L): 7.6 mmol/L

## 2023-09-01 ENCOUNTER — Ambulatory Visit: Payer: Medicare Other | Admitting: Orthopedic Surgery

## 2023-09-01 ENCOUNTER — Encounter: Payer: Self-pay | Admitting: Orthopedic Surgery

## 2023-09-01 ENCOUNTER — Ambulatory Visit (INDEPENDENT_AMBULATORY_CARE_PROVIDER_SITE_OTHER): Admitting: Orthopedic Surgery

## 2023-09-01 VITALS — BP 126/80 | HR 80 | Temp 96.9°F | Ht 66.0 in | Wt 170.8 lb

## 2023-09-01 DIAGNOSIS — E119 Type 2 diabetes mellitus without complications: Secondary | ICD-10-CM

## 2023-09-01 DIAGNOSIS — I6502 Occlusion and stenosis of left vertebral artery: Secondary | ICD-10-CM | POA: Diagnosis not present

## 2023-09-01 DIAGNOSIS — I10 Essential (primary) hypertension: Secondary | ICD-10-CM | POA: Diagnosis not present

## 2023-09-01 DIAGNOSIS — E1169 Type 2 diabetes mellitus with other specified complication: Secondary | ICD-10-CM

## 2023-09-01 DIAGNOSIS — K219 Gastro-esophageal reflux disease without esophagitis: Secondary | ICD-10-CM

## 2023-09-01 DIAGNOSIS — E785 Hyperlipidemia, unspecified: Secondary | ICD-10-CM | POA: Diagnosis not present

## 2023-09-01 DIAGNOSIS — I2510 Atherosclerotic heart disease of native coronary artery without angina pectoris: Secondary | ICD-10-CM

## 2023-09-01 DIAGNOSIS — M7062 Trochanteric bursitis, left hip: Secondary | ICD-10-CM

## 2023-09-01 MED ORDER — PANTOPRAZOLE SODIUM 40 MG PO TBEC
40.0000 mg | DELAYED_RELEASE_TABLET | Freq: Every day | ORAL | 3 refills | Status: AC
  Filled 2024-01-21 – 2024-03-22 (×2): qty 90, 90d supply, fill #0

## 2023-09-01 NOTE — Progress Notes (Signed)
 Careteam: Patient Care Team: Arnetha Bhat, NP as PCP - General (Adult Health Nurse Practitioner) Wendie Hamburg, MD as PCP - Cardiology (Cardiology) Alvina Axon, MD as Consulting Physician (Ophthalmology) [facility], [facility]  Seen by: Ulyses Gandy, AGNP-C  PLACE OF SERVICE:  [facility]  Advanced Directive information    Allergies  Allergen Reactions   Statins Other (See Comments)    Lovastatin Muscle problems   Allopurinol  Rash   Darvocet [Propoxyphene N-Acetaminophen ] Rash    Chief Complaint  Patient presents with   Follow-up    6 month follow up for diabetes , pt stated that every thing has been good no new changes , having some achy all over pain left hip -urine mirco/creat due  -covid vacc.     HPI: Patient is a 78 y.o. female seen today for medical management of chronic conditions.   Discussed the use of AI scribe software for clinical note transcription with the patient, who gave verbal consent to proceed.  History of Present Illness    She experiences persistent left hip pain, described as muscular and located in the joint, radiating down the side of her leg. The pain causes stiffness, particularly in the morning, and makes it difficult for her to sleep on her left side. No recent falls or injuries. She takes Tylenol  650 mg, two tablets at a time, but it does not provide significant relief. She has not tried Voltaren gel on her hip, although she uses it on her foot. She is consider seeing Dr. Lucienne Ryder. Xray bilateral hips 4 years ago did not show arthritic changes.   Her type 2 diabetes is managed with metformin . Recent glucose 113, and  A1c was 6.4. no hypoglycemias. Urine microalbumin done today. Last eye exam> Dr. Terrall Ferraris 07/06/2023.   Her cholesterol levels are well-managed with Zetia  and Crestor , although her triglycerides are slightly elevated. Her LDL was 64.   She has a history of osteopenia, confirmed by a bone density study, and  takes vitamin D  and calcium  supplements for bone health.  04/2023 she had sudden dizziness. H/o neck manipulation with chiropractor 5 days prior. CT head negative for infarct. MRI brain negative for acute infarct. CT angiogram showed severe stenosis of left vertrbral artery neck suspicious for dissection versus atherosclerosis. She completed plavix  and asa x 3 weeks. She is on asa daily now. Recent CT angiogram of the head and neck showed stable blood vessels with no new blockages. No further episodes of dizziness since the stroke.     Review of Systems:  Review of Systems  Constitutional: Negative.   HENT:  Positive for tinnitus.   Eyes: Negative.   Respiratory: Negative.    Cardiovascular: Negative.   Gastrointestinal: Negative.   Genitourinary: Negative.   Musculoskeletal:  Positive for joint pain. Negative for falls.  Skin: Negative.   Neurological: Negative.   Psychiatric/Behavioral: Negative.      Past Medical History:  Diagnosis Date   Acute upper respiratory infections of unspecified site    Allergic rhinitis due to pollen    Arthritis    Benign essential hypertension    Bursitis    Cervical spondylosis without myelopathy    Cervicalgia    Controlled type 2 diabetes mellitus without complication, without long-term current use of insulin  (HCC) 10/01/2019   Diaphragmatic hernia without mention of obstruction or gangrene    Disorder of bone and cartilage, unspecified    Diverticulosis of colon (without mention of hemorrhage)    Encounter for  long-term (current) use of other medications    GERD (gastroesophageal reflux disease)    Glaucoma    open angle   High blood sugar    History of bone density study    History of colonoscopy    History of echocardiogram    History of hiatal hernia    History of Papanicolaou smear of cervix    Hyperlipidemia LDL goal < 100    Hypertension    Keratoconjunctivitis sicca, 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's    Other and unspecified  hyperlipidemia    Pain in joint, site unspecified    Pre-diabetes    Routine general medical examination at a health care facility    Sinusitis    Sjogren's syndrome (HCC) 10/27/2012   Tear film insufficiency, unspecified    Unspecified disorder of kidney and ureter    Unspecified glaucoma(365.9)    Past Surgical History:  Procedure Laterality Date   BACK SURGERY     BELPHAROPTOSIS REPAIR     brow lift   bil   CATARACT EXTRACTION W/ INTRAOCULAR LENS  IMPLANT, BILATERAL Bilateral 12/2014   Dr. Terrall Ferraris   CHOLECYSTECTOMY  1993   Dr.Lindsey    DILATION AND CURETTAGE OF UTERUS     GALLBLADDER SURGERY     LUMBAR LAMINECTOMY/DECOMPRESSION MICRODISCECTOMY N/A 04/21/2017   Procedure: BILATERAL LUMBAR FOUR- LUMBAR FIVE AND LEFT LUMBAR FIVE- SACRAL ONE LAMINOTOMY, MEDIAL FACETECTOMY AND FORAMINOTOMIES;  Surgeon: Yvonna Herder, MD;  Location: [facility] OR;  Service: Neurosurgery;  Laterality: N/A;   NASAL SEPTUM SURGERY     Social History:   reports that she has never smoked. She has never used smokeless tobacco. She reports current alcohol  use. She reports that she does not use drugs.  Family History  Problem Relation Age of Onset   Heart attack Mother    Breast cancer Mother    Heart failure Mother    Pancreatitis Father    Diabetes Brother    Heart disease Brother        By-pass surgery   Lupus Cousin     Medications: Patient's Medications  New Prescriptions   No medications on file  Previous Medications   ACETAMINOPHEN  (TYLENOL ) 650 MG CR TABLET    Take 1,300 mg by mouth every 8 (eight) hours as needed for pain.   ASPIRIN  EC 81 MG TABLET    Take 1 tablet (81 mg total) by mouth daily. Swallow whole.   BRINZOLAMIDE  (AZOPT ) 1 % OPHTHALMIC SUSPENSION    1 drop every 12 (twelve) hours.   CHLORPHENIRAMINE (CHLOR-TRIMETON) 4 MG TABLET    Take 4 mg by mouth daily.   CHOLECALCIFEROL  (VITAMIN D ) 1000 UNITS TABLET    Take 2,000 Units by mouth daily.    EZETIMIBE  (ZETIA ) 10 MG TABLET    TAKE  1 TABLET BY MOUTH EVERY DAY   LOSARTAN  (COZAAR ) 100 MG TABLET    TAKE 1 TABLET BY MOUTH EVERY DAY   METFORMIN  (GLUCOPHAGE -XR) 500 MG 24 HR TABLET    Take 1 tablet (500 mg total) by mouth daily with breakfast.   METOPROLOL  SUCCINATE (TOPROL -XL) 25 MG 24 HR TABLET    TAKE 1 TABLET (25 MG TOTAL) BY MOUTH DAILY.   PANTOPRAZOLE  (PROTONIX ) 40 MG TABLET    TAKE 1 TABLET BY MOUTH EVERY DAY   ROSUVASTATIN  (CRESTOR ) 10 MG TABLET    TAKE 1 TABLET BY MOUTH EVERY DAY  Modified Medications   No medications on file  Discontinued Medications   CLOPIDOGREL  (PLAVIX ) 75 MG TABLET  Take 1 tablet (75 mg total) by mouth daily.    Physical Exam:  There were no vitals filed for this visit. There is no height or weight on file to calculate BMI. Wt Readings from Last 3 Encounters:  07/12/23 170 lb (77.1 kg)  07/07/23 170 lb 9.6 oz (77.4 kg)  05/08/23 176 lb 5.9 oz (80 kg)    Physical Exam Vitals reviewed.  Constitutional:      General: She is not in acute distress. HENT:     Head: Normocephalic.  Eyes:     General:        Right eye: No discharge.        Left eye: No discharge.  Cardiovascular:     Rate and Rhythm: Normal rate and regular rhythm.     Pulses: Normal pulses.     Heart sounds: Normal heart sounds.  Pulmonary:     Effort: Pulmonary effort is normal.     Breath sounds: Normal breath sounds.  Abdominal:     General: Bowel sounds are normal. There is no distension.     Palpations: Abdomen is soft.     Tenderness: There is no abdominal tenderness.  Musculoskeletal:     Cervical back: Neck supple.     Right lower leg: No edema.     Left lower leg: No edema.  Skin:    General: Skin is warm.     Capillary Refill: Capillary refill takes less than 2 seconds.  Neurological:     General: No focal deficit present.     Mental Status: She is alert and oriented to person, place, and time.  Psychiatric:        Mood and Affect: Mood normal.     Labs reviewed: Basic Metabolic  Panel: Recent Labs    05/09/23 0949 05/10/23 0423 08/30/23 0808  NA 138 135 136  K 3.4* 4.0 4.6  CL 105 104 100  CO2 24 23 26   GLUCOSE 125* 117* 113*  BUN 11 9 11   CREATININE 0.71 0.87 1.61  CALCIUM  8.7* 9.0 9.8   Liver Function Tests: Recent Labs    03/03/23 1027 08/30/23 0808  AST 19 16  ALT 20 16  BILITOT 0.7 0.9  PROT 6.8 7.0   No results for input(s): "LIPASE", "AMYLASE" in the last 8760 hours. No results for input(s): "AMMONIA" in the last 8760 hours. CBC: Recent Labs    05/08/23 1348 05/09/23 0949 05/10/23 0423 08/30/23 0808  WBC 11.0* 9.2 9.6 7.5  NEUTROABS 8.5*  --  6.0 4,620  HGB 13.9 14.0 13.5 14.9  HCT 42.1 43.0 40.3 46.1*  MCV 94.4 93.7 91.8 94.1  PLT 224 230 216 269   Lipid Panel: Recent Labs    05/09/23 0950 08/30/23 0808  CHOL 112 129  HDL 39* 42*  LDLCALC 50 64  TRIG 116 155*  CHOLHDL 2.9 3.1   TSH: No results for input(s): "TSH" in the last 8760 hours. A1C: Lab Results  Component Value Date   HGBA1C 6.4 (H) 08/30/2023     Assessment/Plan 1. Benign essential hypertension (Primary) - controlled with losartan  and metoprolol   2. Hyperlipidemia due to type 2 diabetes mellitus (HCC) - LDL 64, goal < 70 - cont Zetia  and Crestor  - limit processed and fried foods in diet - Urine Albumin/Creatinine with ratio (send out) [LAB689]  3. Coronary artery disease involving native coronary artery of native heart without angina pectoris - followed by Dr. Alda Amas - cont asa and statin  4. Gastroesophageal reflux  disease, unspecified whether esophagitis present - hgb stable - pantoprazole  (PROTONIX ) 40 MG tablet; Take 1 tablet (40 mg total) by mouth daily.  Dispense: 90 tablet; Refill: 3  5. Stenosis of left vertebral artery - 04/2023> sudden dizziness> chiropractor neck manipulation 5 days prior - CT angiogram with concerns for dissection - followed by neurology> Dr. Janett Medin - completed Plavix  and asa x 3 weeks, then asa - recent CT  angiogram no new blockages  6. Controlled type 2 diabetes mellitus without complication, without long-term current use of insulin  (HCC) - A1c 6.4 - no hypoglycemia - eye exam 07/06/2023 - cont metformin   - urine microalbumin done today  7. 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 Lucienne Ryder in past - now having groin pain - pain worst in AM - cont tylenol     Total time: 32 minutes. Greater than 50% of total time spent doing patient education regarding health maintenance, T2DM, HTN, HLD, hip pain and vertebral stenosis including symptom/medication management.    Next appt: Visit date not found  Ohana Birdwell Darral Ellis  [facility] [phone]

## 2023-09-01 NOTE — Patient Instructions (Signed)
 Please schedule with Dr. Lucienne Ryder or his PA, Malena Scull for ongoing left hip pain   May try Voltaren gel to left hip

## 2023-09-02 LAB — MICROALBUMIN / CREATININE URINE RATIO
Creatinine, Urine: 39 mg/dL (ref 20–275)
Microalb Creat Ratio: 218 mg/g{creat} — ABNORMAL HIGH (ref <30)
Microalb, Ur: 8.5 mg/dL

## 2023-09-07 ENCOUNTER — Other Ambulatory Visit: Payer: Self-pay | Admitting: Cardiology

## 2023-09-07 DIAGNOSIS — M9905 Segmental and somatic dysfunction of pelvic region: Secondary | ICD-10-CM | POA: Diagnosis not present

## 2023-09-07 DIAGNOSIS — M9904 Segmental and somatic dysfunction of sacral region: Secondary | ICD-10-CM | POA: Diagnosis not present

## 2023-09-07 DIAGNOSIS — M9903 Segmental and somatic dysfunction of lumbar region: Secondary | ICD-10-CM | POA: Diagnosis not present

## 2023-09-07 DIAGNOSIS — M5136 Other intervertebral disc degeneration, lumbar region with discogenic back pain only: Secondary | ICD-10-CM | POA: Diagnosis not present

## 2023-09-14 ENCOUNTER — Other Ambulatory Visit: Payer: Self-pay | Admitting: Cardiology

## 2023-09-19 ENCOUNTER — Other Ambulatory Visit (HOSPITAL_BASED_OUTPATIENT_CLINIC_OR_DEPARTMENT_OTHER): Payer: Self-pay

## 2023-09-19 DIAGNOSIS — M9903 Segmental and somatic dysfunction of lumbar region: Secondary | ICD-10-CM | POA: Diagnosis not present

## 2023-09-19 DIAGNOSIS — M9905 Segmental and somatic dysfunction of pelvic region: Secondary | ICD-10-CM | POA: Diagnosis not present

## 2023-09-19 DIAGNOSIS — M9904 Segmental and somatic dysfunction of sacral region: Secondary | ICD-10-CM | POA: Diagnosis not present

## 2023-09-19 DIAGNOSIS — M5136 Other intervertebral disc degeneration, lumbar region with discogenic back pain only: Secondary | ICD-10-CM | POA: Diagnosis not present

## 2023-09-19 MED ORDER — BRINZOLAMIDE 1 % OP SUSP
1.0000 [drp] | Freq: Two times a day (BID) | OPHTHALMIC | 4 refills | Status: DC
  Filled 2023-09-19: qty 10, 50d supply, fill #0
  Filled 2023-11-01 (×2): qty 10, 50d supply, fill #1
  Filled 2023-12-19: qty 10, 50d supply, fill #2

## 2023-09-22 ENCOUNTER — Other Ambulatory Visit (HOSPITAL_BASED_OUTPATIENT_CLINIC_OR_DEPARTMENT_OTHER): Payer: Self-pay

## 2023-09-29 DIAGNOSIS — M9903 Segmental and somatic dysfunction of lumbar region: Secondary | ICD-10-CM | POA: Diagnosis not present

## 2023-09-29 DIAGNOSIS — M9904 Segmental and somatic dysfunction of sacral region: Secondary | ICD-10-CM | POA: Diagnosis not present

## 2023-09-29 DIAGNOSIS — M5136 Other intervertebral disc degeneration, lumbar region with discogenic back pain only: Secondary | ICD-10-CM | POA: Diagnosis not present

## 2023-09-29 DIAGNOSIS — M9905 Segmental and somatic dysfunction of pelvic region: Secondary | ICD-10-CM | POA: Diagnosis not present

## 2023-10-03 DIAGNOSIS — H04123 Dry eye syndrome of bilateral lacrimal glands: Secondary | ICD-10-CM | POA: Diagnosis not present

## 2023-10-03 DIAGNOSIS — H401132 Primary open-angle glaucoma, bilateral, moderate stage: Secondary | ICD-10-CM | POA: Diagnosis not present

## 2023-10-04 NOTE — Progress Notes (Unsigned)
 Cardiology Office Note:    Date:  10/05/2023  ID:  Felicia Hall, DOB 05/20/1945, MRN [id number]  PCP:  Gil Greig BRAVO, NP  Cardiologist:  Lonni LITTIE Nanas, MD  Electrophysiologist:  None   Referring MD: Gil Greig BRAVO, NP   Chief Complaint  Patient presents with   Coronary artery disease involving native coronary artery of   Follow-up    1 year     History of Present Illness:    Felicia Hall is a 78 y.o. female with a hx of T2DM, GERD, hyperlipidemia, hypertension who presents for follow-up.  She was referred by Dr. Cloria for evaluation of palpitations on 10/24/2019.   She reports that palpitations started in May 2021.  She was watching TV and had an episode where she felt her heart was racing.  Lasted for few minutes and resolved.  She denies any lightheadedness or syncope during this episode.  Then earlier in July 2021 had an episode where she felt like her heart was fluttering, lasted few seconds and resolved.  She reports that she exercises by riding stationary bicycle every day for 30 minutes.  She denies any chest pain or dyspnea.  She denies any lower extremity edema.  No smoking history.  Rare alcohol  use.  She drinks one latte every morning, no other caffeine throughout the day.  Brother had CABG in 44s.  She has had issues with myalgias on statins in the past.    Zio patch x14 days on 11/21/2019 showed no significant arrhythmias.  Calcium  score on 8/2/221 was 463 (86 percentile).  Echo 02/04/2020 showed LVEF 70 to 75%, normal RV function, grade 1 diastolic dysfunction, mild MR, recorded as severe pulmonary artery systolic pressure elevation but pulmonary pressures appear normal.   Lexiscan  Myoview  on 02/04/2020 showed normal perfusion, EF 76%.  Echo 04/30/2021 showed normal biventricular function, no significant valvular disease.  Zio patch x 14 days on 05/22/2021 showed no significant arrhythmias.  Stress PET on 03/16/2022 showed normal perfusion, myocardial blood flow reserve 3.39,  EF 56%.  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  Thought to be related to neck manipulation from chiropractor.  She was started on DAPT x 90 days, now on aspirin  81 mg daily.  Denies any chest pain, dyspnea, lightheadedness, syncope, lower extremity edema, or palpitations.  Rides stationary bike daily for 30 minutes    Past Medical History:  Diagnosis Date   Acute upper respiratory infections of unspecified site    Allergic rhinitis due to pollen    Arthritis    Benign essential hypertension    Bursitis    Cervical spondylosis without myelopathy    Cervicalgia    Controlled type 2 diabetes mellitus without complication, without long-term current use of insulin  (HCC) 10/01/2019   Diaphragmatic hernia without mention of obstruction or gangrene    Disorder of bone and cartilage, unspecified    Diverticulosis of colon (without mention of hemorrhage)    Encounter for long-term (current) use of other medications    GERD (gastroesophageal reflux disease)    Glaucoma    open angle   High blood sugar    History of bone density study    History of colonoscopy    History of echocardiogram    History of hiatal hernia    History of Papanicolaou smear of cervix    Hyperlipidemia LDL goal < 100    Hypertension    Keratoconjunctivitis sicca, not specified as  Sjogren's    Other and unspecified hyperlipidemia    Pain in joint, site unspecified    Pre-diabetes    Routine general medical examination at a health care facility    Sinusitis    Sjogren's syndrome (HCC) 10/27/2012   Tear film insufficiency, unspecified    Unspecified disorder of kidney and ureter    Unspecified glaucoma(365.9)     Past Surgical History:  Procedure Laterality Date   BACK SURGERY     BELPHAROPTOSIS REPAIR     brow lift   bil   CATARACT EXTRACTION W/ INTRAOCULAR LENS  IMPLANT, BILATERAL Bilateral 12/2014   Dr. Charmayne   CHOLECYSTECTOMY  1993    Dr.Lindsey    DILATION AND CURETTAGE OF UTERUS     GALLBLADDER SURGERY     LUMBAR LAMINECTOMY/DECOMPRESSION MICRODISCECTOMY N/A 04/21/2017   Procedure: BILATERAL LUMBAR FOUR- LUMBAR FIVE AND LEFT LUMBAR FIVE- SACRAL ONE LAMINOTOMY, MEDIAL FACETECTOMY AND FORAMINOTOMIES;  Surgeon: Alix Charleston, MD;  Location: [facility] OR;  Service: Neurosurgery;  Laterality: N/A;   NASAL SEPTUM SURGERY      Current Medications: Current Meds  Medication Sig   acetaminophen  (TYLENOL ) 650 MG CR tablet Take 1,300 mg by mouth every 8 (eight) hours as needed for pain.   aspirin  EC 81 MG tablet Take 1 tablet (81 mg total) by mouth daily. Swallow whole.   brinzolamide  (AZOPT ) 1 % ophthalmic suspension Place 1 drop into both eyes every 12 (twelve) hours.   chlorpheniramine (CHLOR-TRIMETON) 4 MG tablet Take 4 mg by mouth daily.   cholecalciferol  (VITAMIN D ) 1000 UNITS tablet Take 2,000 Units by mouth daily.    ezetimibe  (ZETIA ) 10 MG tablet TAKE 1 TABLET BY MOUTH EVERY DAY   losartan  (COZAAR ) 100 MG tablet TAKE 1 TABLET BY MOUTH EVERY DAY   metFORMIN  (GLUCOPHAGE -XR) 500 MG 24 hr tablet Take 1 tablet (500 mg total) by mouth daily with breakfast.   metoprolol  succinate (TOPROL -XL) 25 MG 24 hr tablet TAKE 1 TABLET (25 MG TOTAL) BY MOUTH DAILY.   pantoprazole  (PROTONIX ) 40 MG tablet Take 1 tablet (40 mg total) by mouth daily.   rosuvastatin  (CRESTOR ) 10 MG tablet TAKE 1 TABLET BY MOUTH EVERY DAY     Allergies:   Statins, Allopurinol , and Darvocet [propoxyphene n-acetaminophen ]   Social History   Socioeconomic History   Marital status: Single    Spouse name: Not on file   Number of children: Not on file   Years of education: Not on file   Highest education level: Bachelor's degree (e.g., BA, AB, BS)  Occupational History   Occupation: Librarian, academic  Tobacco Use   Smoking status: Never   Smokeless tobacco: Never  Vaping Use   Vaping status: Never Used  Substance and Sexual Activity   Alcohol  use: Yes     Comment: occasionally   Drug use: No   Sexual activity: Not on file  Other Topics Concern   Not on file  Social History Narrative   Tobacco use, amount per day now: 0   Past tobacco use, amount per day: 0   How many years did you use tobacco: 0   Alcohol  use (drinks per week): 0   Diet:   Do you drink/eat things with caffeine: Yes   Marital status:   Single                               What year were you married?   Do you  live in a house, apartment, assisted living, condo, trailer, etc.? House   Is it one or more stories? One   How many persons live in your home? 1   Do you have pets in your home?( please list) 0   Highest Level of education completed? College   Current or past profession: Librarian, academic.   Do you exercise?     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 2lbs weights 2-3 times week.   Do you have a living will? Yes    Do you have a DNR form?      No                             If not, do you want to discuss one? No   Do you have signed POA/HPOA forms?    No                    If so, please bring to you appointment      Do you have any difficulty bathing or dressing yourself? No   Do you have any difficulty preparing food or eating? No   Do you have any difficulty managing your medications? No   Do you have any difficulty managing your finances? No   Do you have any difficulty affording your medications? No   Social Drivers of Corporate investment banker Strain: Low Risk  (08/31/2023)   Overall Financial Resource Strain (CARDIA)    Difficulty of Paying Living Expenses: Not hard at all  Food Insecurity: No Food Insecurity (08/31/2023)   Hunger Vital Sign    Worried About Running Out of Food in the Last Year: Never true    Ran Out of Food in the Last Year: Never true  Transportation Needs: No Transportation Needs (08/31/2023)   PRAPARE - Administrator, Civil Service (Medical): No    Lack of Transportation  (Non-Medical): No  Physical Activity: Inactive (08/31/2023)   Exercise Vital Sign    Days of Exercise per Week: 0 days    Minutes of Exercise per Session: 30 min  Stress: No Stress Concern Present (08/31/2023)   [employer] of Occupational Health - Occupational Stress Questionnaire    Feeling of Stress : Only a little  Social Connections: Socially Isolated (08/31/2023)   Social Connection and Isolation Panel    Frequency of Communication with Friends and Family: Never    Frequency of Social Gatherings with Friends and Family: Never    Attends Religious Services: Never    Database administrator or Organizations: No    Attends Engineer, structural: Never    Marital Status: Never married     Family History: The patient's family history includes Breast cancer in her mother; Diabetes in her brother; Heart attack in her mother; Heart disease in her brother; Heart failure in her mother; Lupus in her cousin; Pancreatitis in her father.  ROS:   Please see the history of present illness.     All other systems reviewed and are negative.  EKGs/Labs/Other Studies Reviewed:    The following studies were reviewed today:   EKG:  09/27/2022: Normal sinus rhythm, first-degree AV block, rate 67, no ST abnormality 10/05/2023: Sinus rhythm, first-degree AV block, rate 75, low  voltage  Recent Labs: 08/30/2023: ALT 16; BUN 11; Creat 0.74; Hemoglobin 14.9; Platelets 269; Potassium 4.6; Sodium 136  Recent Lipid Panel    Component Value Date/Time   CHOL 129 08/30/2023 0808   CHOL 115 12/18/2020 0813   TRIG 155 (H) 08/30/2023 0808   HDL 42 (L) 08/30/2023 0808   HDL 45 12/18/2020 0813   CHOLHDL 3.1 08/30/2023 0808   VLDL 23 05/09/2023 0950   LDLCALC 64 08/30/2023 0808    Physical Exam:    VS:  BP 127/74 (BP Location: Left Arm, Patient Position: Sitting, Cuff Size: Large)   Pulse 75   Resp 16   Ht 5' 6 (1.676 m)   Wt 167 lb 12.8 oz (76.1 kg)   SpO2 96%   BMI 27.08 kg/m     Wt  Readings from Last 3 Encounters:  10/05/23 167 lb 12.8 oz (76.1 kg)  09/01/23 170 lb 12.8 oz (77.5 kg)  07/12/23 170 lb (77.1 kg)     GEN: Well nourished, well developed in no acute distress HEENT: Normal NECK: No JVD; No carotid bruits LYMPHATICS: No lymphadenopathy CARDIAC: RRR, no murmurs, rubs, gallops RESPIRATORY:  Clear to auscultation without rales, wheezing or rhonchi  ABDOMEN: Soft, non-tender, non-distended MUSCULOSKELETAL:  No edema; No deformity  SKIN: Warm and dry NEUROLOGIC:  Alert and oriented x 3 PSYCHIATRIC:  Normal affect   ASSESSMENT:    1. Coronary artery disease involving native coronary artery of native heart without angina pectoris   2. Essential hypertension   3. Cerebrovascular accident (CVA), unspecified mechanism (HCC)   4. Syncope and collapse   5. Palpitations   6. Hyperlipidemia, unspecified hyperlipidemia type       PLAN:     CAD: Calcium  score on 8/2/221 was 463 (86 percentile).  She reported atypical lower chest/epigastric pain.  Echo 02/04/2020 showed LVEF 70 to 75%, normal RV function, grade 1 diastolic dysfunction, mild MR, recorded as severe pulmonary artery systolic pressure elevation but pulmonary pressures appear normal.  Lexiscan  Myoview  on 02/04/2020 showed normal perfusion, EF 76%.  Stress PET on 03/16/2022 showed normal perfusion, myocardial blood flow reserve 3.39, EF 56%. -Continue aspirin  81 mg daily -LDL 76 on 01/29/2020, rosuvastatin  increased to 20 mg daily but developed myalgias and was reduced to 10 mg daily.  Zetia  10 mg daily added.  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  Thought to be related to neck manipulation from chiropractor.  She was started on DAPT x 90 days, now on aspirin  81 mg daily.   Syncope: Unclear cause.  She reports she thinks she was dehydrated.  Echo 04/30/2021 showed normal biventricular function, no significant valvular disease.  Zio patch x  14 days on 05/22/2021 showed no significant arrhythmias. No recent episodes  Palpitations: Zio patch x14 days on 11/21/2019 showed no significant arrhythmias.  Zio patch x 14 days on 05/22/2021 showed no significant arrhythmias.  She reports palpitations have resolved since starting Toprol -XL, will continue  Hyperlipidemia: LDL 76 on 01/29/2020, rosuvastatin  increased to 20 mg daily but developed myalgias and was reduced to 10 mg daily.  Zetia  10 mg daily added.  LDL 64 on 08/30/2023  Hypertension: On losartan  100 mg daily and Toprol -XL 25 mg daily.  Appears controlled  T2DM: A1c 6.4% on 08/30/2023.  On metformin    RTC in 1 year   Medication Adjustments/Labs and Tests Ordered: Current medicines are reviewed at length with the patient today.  Concerns regarding  medicines are outlined above.  Orders Placed This Encounter  Procedures   EKG 12-Lead   No orders of the defined types were placed in this encounter.   Patient Instructions  Medication Instructions:  continue CURRENT MEDICATION *If you need a refill on your cardiac medications before your next appointment, please call your pharmacy*  Lab Work: none If you have labs (blood work) drawn today and your tests are completely normal, you will receive your results only by: MyChart Message (if you have MyChart) OR A paper copy in the mail If you have any lab test that is abnormal or we need to change your treatment, we will call you to review the results.  Testing/Procedures: none  Follow-Up: At [facility], you and your health needs are our priority.  As part of our continuing mission to provide you with exceptional heart care, our providers are all part of one team.  This team includes your primary Cardiologist (physician) and Advanced Practice Providers or APPs (Physician Assistants and Nurse Practitioners) who all work together to provide you with the care you need, when you need it.  Your next appointment:   1  year(s)  Provider:   Lonni LITTIE Nanas, MD    We recommend signing up for the patient portal called MyChart.  Sign up information is provided on this After Visit Summary.  MyChart is used to connect with patients for Virtual Visits (Telemedicine).  Patients are able to view lab/test results, encounter notes, upcoming appointments, etc.  Non-urgent messages can be sent to your provider as well.   To learn more about what you can do with MyChart, go to [email or web address].   Other Instructions none       Signed, Lonni LITTIE Nanas, MD  10/05/2023 11:31 AM    [facility]

## 2023-10-05 ENCOUNTER — Encounter: Payer: Self-pay | Admitting: Cardiology

## 2023-10-05 ENCOUNTER — Ambulatory Visit: Payer: Medicare HMO | Attending: Cardiology | Admitting: Cardiology

## 2023-10-05 VITALS — BP 127/74 | HR 75 | Resp 16 | Ht 66.0 in | Wt 167.8 lb

## 2023-10-05 DIAGNOSIS — I639 Cerebral infarction, unspecified: Secondary | ICD-10-CM | POA: Diagnosis not present

## 2023-10-05 DIAGNOSIS — I2510 Atherosclerotic heart disease of native coronary artery without angina pectoris: Secondary | ICD-10-CM | POA: Diagnosis not present

## 2023-10-05 DIAGNOSIS — R55 Syncope and collapse: Secondary | ICD-10-CM | POA: Diagnosis not present

## 2023-10-05 DIAGNOSIS — R002 Palpitations: Secondary | ICD-10-CM

## 2023-10-05 DIAGNOSIS — E785 Hyperlipidemia, unspecified: Secondary | ICD-10-CM

## 2023-10-05 DIAGNOSIS — I10 Essential (primary) hypertension: Secondary | ICD-10-CM

## 2023-10-05 NOTE — Patient Instructions (Signed)
 Medication Instructions:  continue CURRENT MEDICATION *If you need a refill on your cardiac medications before your next appointment, please call your pharmacy*  Lab Work: none If you have labs (blood work) drawn today and your tests are completely normal, you will receive your results only by: MyChart Message (if you have MyChart) OR A paper copy in the mail If you have any lab test that is abnormal or we need to change your treatment, we will call you to review the results.  Testing/Procedures: none  Follow-Up: At [facility], you and your health needs are our priority.  As part of our continuing mission to provide you with exceptional heart care, our providers are all part of one team.  This team includes your primary Cardiologist (physician) and Advanced Practice Providers or APPs (Physician Assistants and Nurse Practitioners) who all work together to provide you with the care you need, when you need it.  Your next appointment:   1 year(s)  Provider:   Lonni LITTIE Nanas, MD    We recommend signing up for the patient portal called MyChart.  Sign up information is provided on this After Visit Summary.  MyChart is used to connect with patients for Virtual Visits (Telemedicine).  Patients are able to view lab/test results, encounter notes, upcoming appointments, etc.  Non-urgent messages can be sent to your provider as well.   To learn more about what you can do with MyChart, go to [email or web address].   Other Instructions none

## 2023-10-10 DIAGNOSIS — M9903 Segmental and somatic dysfunction of lumbar region: Secondary | ICD-10-CM | POA: Diagnosis not present

## 2023-10-10 DIAGNOSIS — M9904 Segmental and somatic dysfunction of sacral region: Secondary | ICD-10-CM | POA: Diagnosis not present

## 2023-10-10 DIAGNOSIS — M5136 Other intervertebral disc degeneration, lumbar region with discogenic back pain only: Secondary | ICD-10-CM | POA: Diagnosis not present

## 2023-10-10 DIAGNOSIS — M9905 Segmental and somatic dysfunction of pelvic region: Secondary | ICD-10-CM | POA: Diagnosis not present

## 2023-10-20 DIAGNOSIS — M9903 Segmental and somatic dysfunction of lumbar region: Secondary | ICD-10-CM | POA: Diagnosis not present

## 2023-10-20 DIAGNOSIS — M9904 Segmental and somatic dysfunction of sacral region: Secondary | ICD-10-CM | POA: Diagnosis not present

## 2023-10-20 DIAGNOSIS — M5136 Other intervertebral disc degeneration, lumbar region with discogenic back pain only: Secondary | ICD-10-CM | POA: Diagnosis not present

## 2023-10-20 DIAGNOSIS — M9905 Segmental and somatic dysfunction of pelvic region: Secondary | ICD-10-CM | POA: Diagnosis not present

## 2023-10-27 ENCOUNTER — Encounter: Payer: Self-pay | Admitting: Cardiology

## 2023-10-27 ENCOUNTER — Other Ambulatory Visit: Payer: Self-pay

## 2023-10-27 ENCOUNTER — Other Ambulatory Visit (HOSPITAL_BASED_OUTPATIENT_CLINIC_OR_DEPARTMENT_OTHER): Payer: Self-pay

## 2023-10-27 MED ORDER — EZETIMIBE 10 MG PO TABS
10.0000 mg | ORAL_TABLET | Freq: Every day | ORAL | 3 refills | Status: AC
  Filled 2023-10-27: qty 90, 90d supply, fill #0
  Filled 2024-01-25: qty 90, 90d supply, fill #1
  Filled 2024-04-30: qty 90, 90d supply, fill #2

## 2023-10-27 NOTE — Telephone Encounter (Signed)
 RX sent to requested Pharmacy

## 2023-10-31 DIAGNOSIS — M9903 Segmental and somatic dysfunction of lumbar region: Secondary | ICD-10-CM | POA: Diagnosis not present

## 2023-10-31 DIAGNOSIS — M5136 Other intervertebral disc degeneration, lumbar region with discogenic back pain only: Secondary | ICD-10-CM | POA: Diagnosis not present

## 2023-10-31 DIAGNOSIS — M9904 Segmental and somatic dysfunction of sacral region: Secondary | ICD-10-CM | POA: Diagnosis not present

## 2023-10-31 DIAGNOSIS — M9905 Segmental and somatic dysfunction of pelvic region: Secondary | ICD-10-CM | POA: Diagnosis not present

## 2023-11-01 ENCOUNTER — Other Ambulatory Visit (HOSPITAL_BASED_OUTPATIENT_CLINIC_OR_DEPARTMENT_OTHER): Payer: Self-pay

## 2023-11-10 DIAGNOSIS — M9903 Segmental and somatic dysfunction of lumbar region: Secondary | ICD-10-CM | POA: Diagnosis not present

## 2023-11-10 DIAGNOSIS — M5136 Other intervertebral disc degeneration, lumbar region with discogenic back pain only: Secondary | ICD-10-CM | POA: Diagnosis not present

## 2023-11-10 DIAGNOSIS — M9905 Segmental and somatic dysfunction of pelvic region: Secondary | ICD-10-CM | POA: Diagnosis not present

## 2023-11-10 DIAGNOSIS — M9904 Segmental and somatic dysfunction of sacral region: Secondary | ICD-10-CM | POA: Diagnosis not present

## 2023-11-11 ENCOUNTER — Other Ambulatory Visit: Payer: Self-pay | Admitting: Cardiology

## 2023-11-13 ENCOUNTER — Other Ambulatory Visit: Payer: Self-pay | Admitting: Orthopedic Surgery

## 2023-11-13 DIAGNOSIS — E1165 Type 2 diabetes mellitus with hyperglycemia: Secondary | ICD-10-CM

## 2023-11-21 DIAGNOSIS — M9903 Segmental and somatic dysfunction of lumbar region: Secondary | ICD-10-CM | POA: Diagnosis not present

## 2023-11-21 DIAGNOSIS — M9904 Segmental and somatic dysfunction of sacral region: Secondary | ICD-10-CM | POA: Diagnosis not present

## 2023-11-21 DIAGNOSIS — M5136 Other intervertebral disc degeneration, lumbar region with discogenic back pain only: Secondary | ICD-10-CM | POA: Diagnosis not present

## 2023-11-21 DIAGNOSIS — M9905 Segmental and somatic dysfunction of pelvic region: Secondary | ICD-10-CM | POA: Diagnosis not present

## 2023-12-01 DIAGNOSIS — M5136 Other intervertebral disc degeneration, lumbar region with discogenic back pain only: Secondary | ICD-10-CM | POA: Diagnosis not present

## 2023-12-01 DIAGNOSIS — M9905 Segmental and somatic dysfunction of pelvic region: Secondary | ICD-10-CM | POA: Diagnosis not present

## 2023-12-01 DIAGNOSIS — M9904 Segmental and somatic dysfunction of sacral region: Secondary | ICD-10-CM | POA: Diagnosis not present

## 2023-12-01 DIAGNOSIS — M9903 Segmental and somatic dysfunction of lumbar region: Secondary | ICD-10-CM | POA: Diagnosis not present

## 2023-12-03 ENCOUNTER — Other Ambulatory Visit: Payer: Self-pay | Admitting: Cardiology

## 2023-12-13 DIAGNOSIS — M9904 Segmental and somatic dysfunction of sacral region: Secondary | ICD-10-CM | POA: Diagnosis not present

## 2023-12-13 DIAGNOSIS — M9903 Segmental and somatic dysfunction of lumbar region: Secondary | ICD-10-CM | POA: Diagnosis not present

## 2023-12-13 DIAGNOSIS — M5136 Other intervertebral disc degeneration, lumbar region with discogenic back pain only: Secondary | ICD-10-CM | POA: Diagnosis not present

## 2023-12-13 DIAGNOSIS — M9905 Segmental and somatic dysfunction of pelvic region: Secondary | ICD-10-CM | POA: Diagnosis not present

## 2023-12-14 ENCOUNTER — Other Ambulatory Visit: Payer: Self-pay | Admitting: Cardiology

## 2023-12-19 ENCOUNTER — Other Ambulatory Visit: Payer: Self-pay

## 2023-12-19 DIAGNOSIS — M9903 Segmental and somatic dysfunction of lumbar region: Secondary | ICD-10-CM | POA: Diagnosis not present

## 2023-12-19 DIAGNOSIS — M5136 Other intervertebral disc degeneration, lumbar region with discogenic back pain only: Secondary | ICD-10-CM | POA: Diagnosis not present

## 2023-12-19 DIAGNOSIS — M9905 Segmental and somatic dysfunction of pelvic region: Secondary | ICD-10-CM | POA: Diagnosis not present

## 2023-12-19 DIAGNOSIS — M9904 Segmental and somatic dysfunction of sacral region: Secondary | ICD-10-CM | POA: Diagnosis not present

## 2023-12-22 DIAGNOSIS — M9905 Segmental and somatic dysfunction of pelvic region: Secondary | ICD-10-CM | POA: Diagnosis not present

## 2023-12-22 DIAGNOSIS — M5136 Other intervertebral disc degeneration, lumbar region with discogenic back pain only: Secondary | ICD-10-CM | POA: Diagnosis not present

## 2023-12-22 DIAGNOSIS — M9904 Segmental and somatic dysfunction of sacral region: Secondary | ICD-10-CM | POA: Diagnosis not present

## 2023-12-22 DIAGNOSIS — M9903 Segmental and somatic dysfunction of lumbar region: Secondary | ICD-10-CM | POA: Diagnosis not present

## 2023-12-29 DIAGNOSIS — M5136 Other intervertebral disc degeneration, lumbar region with discogenic back pain only: Secondary | ICD-10-CM | POA: Diagnosis not present

## 2023-12-29 DIAGNOSIS — M9903 Segmental and somatic dysfunction of lumbar region: Secondary | ICD-10-CM | POA: Diagnosis not present

## 2023-12-29 DIAGNOSIS — M9905 Segmental and somatic dysfunction of pelvic region: Secondary | ICD-10-CM | POA: Diagnosis not present

## 2023-12-29 DIAGNOSIS — M9904 Segmental and somatic dysfunction of sacral region: Secondary | ICD-10-CM | POA: Diagnosis not present

## 2024-01-06 DIAGNOSIS — H401132 Primary open-angle glaucoma, bilateral, moderate stage: Secondary | ICD-10-CM | POA: Diagnosis not present

## 2024-01-06 DIAGNOSIS — H1789 Other corneal scars and opacities: Secondary | ICD-10-CM | POA: Diagnosis not present

## 2024-01-06 DIAGNOSIS — Z961 Presence of intraocular lens: Secondary | ICD-10-CM | POA: Diagnosis not present

## 2024-01-06 LAB — OPHTHALMOLOGY REPORT-SCANNED

## 2024-01-11 DIAGNOSIS — M9904 Segmental and somatic dysfunction of sacral region: Secondary | ICD-10-CM | POA: Diagnosis not present

## 2024-01-11 DIAGNOSIS — M9903 Segmental and somatic dysfunction of lumbar region: Secondary | ICD-10-CM | POA: Diagnosis not present

## 2024-01-11 DIAGNOSIS — M5136 Other intervertebral disc degeneration, lumbar region with discogenic back pain only: Secondary | ICD-10-CM | POA: Diagnosis not present

## 2024-01-11 DIAGNOSIS — M9905 Segmental and somatic dysfunction of pelvic region: Secondary | ICD-10-CM | POA: Diagnosis not present

## 2024-01-13 DIAGNOSIS — Z1231 Encounter for screening mammogram for malignant neoplasm of breast: Secondary | ICD-10-CM | POA: Diagnosis not present

## 2024-01-13 DIAGNOSIS — Z124 Encounter for screening for malignant neoplasm of cervix: Secondary | ICD-10-CM | POA: Diagnosis not present

## 2024-01-13 DIAGNOSIS — Z1331 Encounter for screening for depression: Secondary | ICD-10-CM | POA: Diagnosis not present

## 2024-01-13 LAB — HM MAMMOGRAPHY

## 2024-01-16 ENCOUNTER — Ambulatory Visit: Payer: Self-pay | Admitting: Orthopedic Surgery

## 2024-01-16 ENCOUNTER — Ambulatory Visit: Admitting: Orthopaedic Surgery

## 2024-01-16 ENCOUNTER — Other Ambulatory Visit (INDEPENDENT_AMBULATORY_CARE_PROVIDER_SITE_OTHER)

## 2024-01-16 ENCOUNTER — Other Ambulatory Visit: Payer: Self-pay

## 2024-01-16 DIAGNOSIS — G8929 Other chronic pain: Secondary | ICD-10-CM

## 2024-01-16 DIAGNOSIS — M79605 Pain in left leg: Secondary | ICD-10-CM | POA: Diagnosis not present

## 2024-01-16 DIAGNOSIS — M7062 Trochanteric bursitis, left hip: Secondary | ICD-10-CM | POA: Diagnosis not present

## 2024-01-16 DIAGNOSIS — M5442 Lumbago with sciatica, left side: Secondary | ICD-10-CM | POA: Diagnosis not present

## 2024-01-16 MED ORDER — METHYLPREDNISOLONE ACETATE 40 MG/ML IJ SUSP
40.0000 mg | INTRAMUSCULAR | Status: AC | PRN
  Administered 2024-01-16: 40 mg via INTRA_ARTICULAR

## 2024-01-16 MED ORDER — LIDOCAINE HCL 1 % IJ SOLN
3.0000 mL | INTRAMUSCULAR | Status: AC | PRN
  Administered 2024-01-16: 3 mL

## 2024-01-16 NOTE — Progress Notes (Signed)
 The patient is a 78 year old female who we have seen remotely in the past.  She comes in with left hip pain but she really points to the sciatic region as a source of her pain it does radiate down her left leg.  She feels like that hip is weak.  She does have pain on the side of the hip but not in the groin.  She does have a history of extensive lumbar spine surgery by Dr. Alix back in 2019.  From the op note from that surgery she did have bilateral L4-L5 and L5-S1 lumbar laminotomies with medial foraminotomies for decompression of the left L4, L5 and S1 nerve and the right L4 and L5 nerves.  This was all microdissection and no instrumentation.  On my exam today her left hip and right hip moves smoothly and fluidly.  She does walk with a slight Trendelenburg gait.  She does have pain to palpation of the lateral aspect of her left hip.  There is some hip weakness and she does have a positive straight leg raise on the left side.  An AP pelvis and lateral of her left hip shows normal-appearing hip joint spaces bilaterally which is some slight calcifications of the trochanteric area.  Lumbar spine x-rays today show significant degenerative changes at multiple levels of the lumbar spine.  She did let me know that she had thought about contacting Dr. Eileen office to be seen.  I do feel that it was reasonable for us  to make a referral for her to see one of his partners.  I did recommend a steroid injection of her left hip trochanteric area and she agreed to this and tolerated it very well.  Will work on making that referral.    Procedure Note  Patient: Felicia Hall             Date of Birth: 1946/04/01           MRN: [id number]             Visit Date: 01/16/2024  Procedures: Visit Diagnoses:  1. Pain in left leg   2. Chronic left-sided low back pain with left-sided sciatica   3. Trochanteric bursitis, left hip     Large Joint Inj: L greater trochanter on 01/16/2024 10:49 AM Indications:  pain and diagnostic evaluation Details: 22 G 1.5 in needle, lateral approach  Arthrogram: No  Medications: 3 mL lidocaine  1 %; 40 mg methylPREDNISolone  acetate 40 MG/ML Outcome: tolerated well, no immediate complications Procedure, treatment alternatives, risks and benefits explained, specific risks discussed. Consent was given by the patient. Immediately prior to procedure a time out was called to verify the correct patient, procedure, equipment, support staff and site/side marked as required. Patient was prepped and draped in the usual sterile fashion.

## 2024-01-19 ENCOUNTER — Other Ambulatory Visit (HOSPITAL_BASED_OUTPATIENT_CLINIC_OR_DEPARTMENT_OTHER): Payer: Self-pay

## 2024-01-19 MED ORDER — TIMOLOL MALEATE 0.5 % OP SOLN
1.0000 [drp] | Freq: Every day | OPHTHALMIC | 11 refills | Status: AC
  Filled 2024-01-19: qty 5, 50d supply, fill #0
  Filled 2024-02-20 – 2024-02-23 (×3): qty 5, 50d supply, fill #1
  Filled 2024-03-26 – 2024-03-30 (×2): qty 5, 50d supply, fill #2
  Filled 2024-04-30 (×2): qty 5, 50d supply, fill #3

## 2024-01-20 ENCOUNTER — Other Ambulatory Visit (HOSPITAL_BASED_OUTPATIENT_CLINIC_OR_DEPARTMENT_OTHER): Payer: Self-pay

## 2024-01-20 ENCOUNTER — Other Ambulatory Visit (HOSPITAL_COMMUNITY): Payer: Self-pay

## 2024-01-20 MED ORDER — COMIRNATY 30 MCG/0.3ML IM SUSY
0.3000 mL | PREFILLED_SYRINGE | Freq: Once | INTRAMUSCULAR | 0 refills | Status: AC
  Filled 2024-01-20: qty 0.3, 1d supply, fill #0

## 2024-01-21 ENCOUNTER — Other Ambulatory Visit (HOSPITAL_BASED_OUTPATIENT_CLINIC_OR_DEPARTMENT_OTHER): Payer: Self-pay

## 2024-01-21 MED ORDER — MIRABEGRON ER 25 MG PO TB24: 25.0000 mg | ORAL_TABLET | Freq: Every day | ORAL | 0 refills | Status: DC

## 2024-01-21 MED ORDER — ESTRADIOL 0.01 % VA CREA
0.5000 g | TOPICAL_CREAM | VAGINAL | 3 refills | Status: DC
  Filled 2024-01-21: qty 42.5, 42d supply, fill #0

## 2024-01-21 MED ORDER — MIRABEGRON ER 50 MG PO TB24
50.0000 mg | ORAL_TABLET | Freq: Every day | ORAL | 0 refills | Status: DC
  Filled 2024-01-21: qty 90, 90d supply, fill #0

## 2024-01-23 ENCOUNTER — Other Ambulatory Visit (HOSPITAL_BASED_OUTPATIENT_CLINIC_OR_DEPARTMENT_OTHER): Payer: Self-pay

## 2024-01-23 DIAGNOSIS — M9905 Segmental and somatic dysfunction of pelvic region: Secondary | ICD-10-CM | POA: Diagnosis not present

## 2024-01-23 DIAGNOSIS — M5136 Other intervertebral disc degeneration, lumbar region with discogenic back pain only: Secondary | ICD-10-CM | POA: Diagnosis not present

## 2024-01-23 DIAGNOSIS — M9903 Segmental and somatic dysfunction of lumbar region: Secondary | ICD-10-CM | POA: Diagnosis not present

## 2024-01-23 DIAGNOSIS — M9904 Segmental and somatic dysfunction of sacral region: Secondary | ICD-10-CM | POA: Diagnosis not present

## 2024-01-25 ENCOUNTER — Other Ambulatory Visit (HOSPITAL_COMMUNITY): Payer: Self-pay

## 2024-01-26 DIAGNOSIS — M5416 Radiculopathy, lumbar region: Secondary | ICD-10-CM | POA: Diagnosis not present

## 2024-01-28 ENCOUNTER — Other Ambulatory Visit (HOSPITAL_BASED_OUTPATIENT_CLINIC_OR_DEPARTMENT_OTHER): Payer: Self-pay

## 2024-01-30 NOTE — Progress Notes (Unsigned)
 No chief complaint on file.   HISTORY OF PRESENT ILLNESS:  01/30/24 ALL:  Felicia Hall is a 78 y.o. female here today for follow up for hx of TIA 04/2023. She was seen in consult with Dr Rosemarie 07/2023. Repeat CTA was stable with no new blockages. She was advised to continue DAPT for 3 months then continue asa only due to side effects with Plavix . Since,   Last A1C 08/2023 6.4. LDL 64. She continues metformin  XR 500mg  and rosuvastatin  10mg  daily. BP?  Aspirin ?   HISTORY (copied from Dr Bucky previous note)  HPI: Mr. Felicia Hall is a pleasant 78 year old Caucasian lady seen today for initial office follow-up visit in hospital consultation for TIA in January 2025.  History is obtained from the patient and review of electronic medical records.  I personally reviewed pertinent imaging films in PACS.  She presented on 05/10/2023 with sudden onset of dizziness and room spinning sensation which appear to be worse when she was moving when improved with resting or staying still.  She did give history of going to a chiropractor for neck manipulation 5 days prior.  She denied any double vision loss of vision extremity weakness numbness or gait or balance problems.  CT head was negative for acute infarct.  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  MRI of the brain was negative for acute infarct.  2D echo showed ejection fraction of 65 to 70% with grade 1 diastolic dysfunction.  LDL cholesterol 50 mg percent.  Hemoglobin A1c was 6.3.  Patient was on aspirin  prior to admission and she was switched to dual antiplatelet therapy of aspirin  and Plavix  for 3 months followed by Plavix  alone.  Patient states she has done well since discharge.  She has had no more dizzy spells or vertigo.  She has had no other recurrent stroke or TIA symptoms.  She feels after  addition of Plavix  she is having some shortness of breath and feeling tired easily.  She states her blood pressure is under good control.  She states her sugars are also going well.  She is tolerating Crestor  well without muscle aches and pains.  He has no new complaints.    REVIEW OF SYSTEMS: Out of a complete 14 system review of symptoms, the patient complains only of the following symptoms, and all other reviewed systems are negative.   ALLERGIES: Allergies  Allergen Reactions   Statins Other (See Comments)    Lovastatin Muscle problems   Allopurinol  Rash   Darvocet [Propoxyphene N-Acetaminophen ] Rash     HOME MEDICATIONS: Outpatient Medications Prior to Visit  Medication Sig Dispense Refill   acetaminophen  (TYLENOL ) 650 MG CR tablet Take 1,300 mg by mouth every 8 (eight) hours as needed for pain.     aspirin  EC 81 MG tablet Take 1 tablet (81 mg total) by mouth daily. Swallow whole. 30 tablet 11   brinzolamide  (AZOPT ) 1 % ophthalmic suspension Place 1 drop into both eyes every 12 (twelve) hours. 10 mL 4   chlorpheniramine (CHLOR-TRIMETON) 4 MG tablet Take 4 mg by mouth daily.     cholecalciferol  (VITAMIN D ) 1000 UNITS tablet Take 2,000 Units by mouth daily.      estradiol  (ESTRACE ) 0.01 % CREA vaginal cream Place 0.5 g vaginally 2 (two) times a week. Place 0.5 g nightly for 2 weeks then twice a week after. 42.5  g 3   ezetimibe  (ZETIA ) 10 MG tablet Take 1 tablet (10 mg total) by mouth daily. 90 tablet 3   losartan  (COZAAR ) 100 MG tablet TAKE 1 TABLET BY MOUTH EVERY DAY 90 tablet 3   metFORMIN  (GLUCOPHAGE -XR) 500 MG 24 hr tablet Take 1 tablet (500 mg total) by mouth daily with breakfast. 90 tablet 2   metoprolol  succinate (TOPROL -XL) 25 MG 24 hr tablet Take 1 tablet (25 mg total) by mouth daily. 90 tablet 3   mirabegron  ER (MYRBETRIQ ) 25 MG TB24 tablet Take 1 tablet (25 mg total) by mouth daily. 30 tablet 0   mirabegron  ER (MYRBETRIQ ) 50 MG TB24 tablet Take 1 tablet (50 mg total) by  mouth daily. 90 tablet 0   pantoprazole  (PROTONIX ) 40 MG tablet Take 1 tablet (40 mg total) by mouth daily. 90 tablet 3   rosuvastatin  (CRESTOR ) 10 MG tablet Take 1 tablet (10 mg total) by mouth daily. 90 tablet 2   timolol (TIMOPTIC) 0.5 % ophthalmic solution Place 1 drop into both eyes daily. 5 mL 11   No facility-administered medications prior to visit.     PAST MEDICAL HISTORY: Past Medical History:  Diagnosis Date   Acute upper respiratory infections of unspecified site    Allergic rhinitis due to pollen    Arthritis    Benign essential hypertension    Bursitis    Cervical spondylosis without myelopathy    Cervicalgia    Controlled type 2 diabetes mellitus without complication, without long-term current use of insulin  (HCC) 10/01/2019   Diaphragmatic hernia without mention of obstruction or gangrene    Disorder of bone and cartilage, unspecified    Diverticulosis of colon (without mention of hemorrhage)    Encounter for long-term (current) use of other medications    GERD (gastroesophageal reflux disease)    Glaucoma    open angle   High blood sugar    History of bone density study    History of colonoscopy    History of echocardiogram    History of hiatal hernia    History of Papanicolaou smear of cervix    Hyperlipidemia LDL goal < 100    Hypertension    Keratoconjunctivitis sicca, 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's    Other and unspecified hyperlipidemia    Pain in joint, site unspecified    Pre-diabetes    Routine general medical examination at a health care facility    Sinusitis    Sjogren's syndrome 10/27/2012   Tear film insufficiency, unspecified    Unspecified disorder of kidney and ureter    Unspecified glaucoma(365.9)      PAST SURGICAL HISTORY: Past Surgical History:  Procedure Laterality Date   BACK SURGERY     BELPHAROPTOSIS REPAIR     brow lift   bil   CATARACT EXTRACTION W/ INTRAOCULAR LENS  IMPLANT, BILATERAL Bilateral 12/2014   Dr. Charmayne    CHOLECYSTECTOMY  1993   Dr.Lindsey    DILATION AND CURETTAGE OF UTERUS     GALLBLADDER SURGERY     LUMBAR LAMINECTOMY/DECOMPRESSION MICRODISCECTOMY N/A 04/21/2017   Procedure: BILATERAL LUMBAR FOUR- LUMBAR FIVE AND LEFT LUMBAR FIVE- SACRAL ONE LAMINOTOMY, MEDIAL FACETECTOMY AND FORAMINOTOMIES;  Surgeon: Alix Charleston, MD;  Location: [facility] OR;  Service: Neurosurgery;  Laterality: N/A;   NASAL SEPTUM SURGERY       FAMILY HISTORY: Family History  Problem Relation Age of Onset   Heart attack Mother    Breast cancer Mother    Heart failure Mother    Pancreatitis  Father    Diabetes Brother    Heart disease Brother        By-pass surgery   Lupus Cousin      SOCIAL HISTORY: Social History   Socioeconomic History   Marital status: Single    Spouse name: Not on file   Number of children: Not on file   Years of education: Not on file   Highest education level: Bachelor's degree (e.g., BA, AB, BS)  Occupational History   Occupation: Librarian, academic  Tobacco Use   Smoking status: Never   Smokeless tobacco: Never  Vaping Use   Vaping status: Never Used  Substance and Sexual Activity   Alcohol  use: Yes    Comment: occasionally   Drug use: No   Sexual activity: Not on file  Other Topics Concern   Not on file  Social History Narrative   Tobacco use, amount per day now: 0   Past tobacco use, amount per day: 0   How many years did you use tobacco: 0   Alcohol  use (drinks per week): 0   Diet:   Do you drink/eat things with caffeine: Yes   Marital status:   Single                               What year were you married?   Do you live in a house, apartment, assisted living, condo, trailer, etc.? House   Is it one or more stories? One   How many persons live in your home? 1   Do you have pets in your home?( please list) 0   Highest Level of education completed? College   Current or past profession: Librarian, academic.   Do you exercise?     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 2lbs weights 2-3 times week.   Do you have a living will? Yes    Do you have a DNR form?      No                             If not, do you want to discuss one? No   Do you have signed POA/HPOA forms?    No                    If so, please bring to you appointment      Do you have any difficulty bathing or dressing yourself? No   Do you have any difficulty preparing food or eating? No   Do you have any difficulty managing your medications? No   Do you have any difficulty managing your finances? No   Do you have any difficulty affording your medications? No   Social Drivers of Corporate investment banker Strain: Low Risk  (08/31/2023)   Overall Financial Resource Strain (CARDIA)    Difficulty of Paying Living Expenses: Not hard at all  Food Insecurity: No Food Insecurity (08/31/2023)   Hunger Vital Sign    Worried About Running Out of Food in the Last Year: Never true    Ran Out of Food in the Last Year: Never true  Transportation Needs: No Transportation Needs (08/31/2023)   PRAPARE - Transportation    Lack  of Transportation (Medical): No    Lack of Transportation (Non-Medical): No  Physical Activity: Inactive (08/31/2023)   Exercise Vital Sign    Days of Exercise per Week: 0 days    Minutes of Exercise per Session: 30 min  Stress: No Stress Concern Present (08/31/2023)   [employer] of Occupational Health - Occupational Stress Questionnaire    Feeling of Stress : Only a little  Social Connections: Socially Isolated (08/31/2023)   Social Connection and Isolation Panel    Frequency of Communication with Friends and Family: Never    Frequency of Social Gatherings with Friends and Family: Never    Attends Religious Services: Never    Database administrator or Organizations: No    Attends Banker Meetings: Never    Marital Status: Never married  Intimate Partner Violence: Not At Risk (07/07/2023)   Humiliation, Afraid, Rape, and  Kick questionnaire    Fear of Current or Ex-Partner: No    Emotionally Abused: No    Physically Abused: No    Sexually Abused: No     PHYSICAL EXAM  There were no vitals filed for this visit. There is no height or weight on file to calculate BMI.  Generalized: Well developed, in no acute distress  Cardiology: normal rate and rhythm, no murmur auscultated  Respiratory: clear to auscultation bilaterally    Neurological examination  Mentation: Alert oriented to time, place, history taking. Follows all commands speech and language fluent Cranial nerve II-XII: Pupils were equal round reactive to light. Extraocular movements were full, visual field were full on confrontational test. Facial sensation and strength were normal. Uvula tongue midline. Head turning and shoulder shrug  were normal and symmetric. Motor: The motor testing reveals 5 over 5 strength of all 4 extremities. Good symmetric motor tone is noted throughout.  Sensory: Sensory testing is intact to soft touch on all 4 extremities. No evidence of extinction is noted.  Coordination: Cerebellar testing reveals good finger-nose-finger and heel-to-shin bilaterally.  Gait and station: Gait is normal. Tandem gait is normal. Romberg is negative. No drift is seen.  Reflexes: Deep tendon reflexes are symmetric and normal bilaterally.    DIAGNOSTIC DATA (LABS, IMAGING, TESTING) - I reviewed patient records, labs, notes, testing and imaging myself where available.  Lab Results  Component Value Date   WBC 7.5 08/30/2023   HGB 14.9 08/30/2023   HCT 46.1 (H) 08/30/2023   MCV 94.1 08/30/2023   PLT 269 08/30/2023      Component Value Date/Time   NA 136 08/30/2023 0808   NA 134 04/26/2022 0828   K 4.6 08/30/2023 0808   CL 100 08/30/2023 0808   CO2 26 08/30/2023 0808   GLUCOSE 113 (H) 08/30/2023 0808   BUN 11 08/30/2023 0808   BUN 12 04/26/2022 0828   CREATININE 0.74 08/30/2023 0808   CALCIUM  9.8 08/30/2023 0808   PROT 7.0  08/30/2023 0808   PROT 6.9 09/10/2015 0848   ALBUMIN 4.5 05/23/2016 0118   ALBUMIN 4.4 09/10/2015 0848   AST 16 08/30/2023 0808   ALT 16 08/30/2023 0808   ALKPHOS 48 05/23/2016 0118   BILITOT 0.9 08/30/2023 0808   BILITOT 0.4 09/10/2015 0848   GFRNONAA >60 05/10/2023 0423   GFRNONAA 82 06/09/2020 0828   GFRAA 96 06/09/2020 0828   Lab Results  Component Value Date   CHOL 129 08/30/2023   HDL 42 (L) 08/30/2023   LDLCALC 64 08/30/2023   TRIG 155 (H) 08/30/2023   CHOLHDL 3.1  08/30/2023   Lab Results  Component Value Date   HGBA1C 6.4 (H) 08/30/2023   No results found for: VITAMINB12 Lab Results  Component Value Date   TSH 3.47 06/28/2022       07/01/2022   10:16 AM 06/25/2021   10:03 AM 09/12/2017    1:07 PM  MMSE - Mini Mental State Exam  Orientation to time 5 5 5   Orientation to Place 5 5 5   Registration 3 3 3   Attention/ Calculation 5 5 5   Recall 3 3 3   Language- name 2 objects 2 2 2   Language- repeat 1 1 1   Language- follow 3 step command 3 3 3   Language- read & follow direction 1 1 1   Write a sentence 1 1 1   Copy design 1 1 1   Total score 30 30 30          No data to display           ASSESSMENT AND PLAN  78 y.o. year old female  has a past medical history of Acute upper respiratory infections of unspecified site, Allergic rhinitis due to pollen, Arthritis, Benign essential hypertension, Bursitis, Cervical spondylosis without myelopathy, Cervicalgia, Controlled type 2 diabetes mellitus without complication, without long-term current use of insulin  (HCC) (10/01/2019), Diaphragmatic hernia without mention of obstruction or gangrene, Disorder of bone and cartilage, unspecified, Diverticulosis of colon (without mention of hemorrhage), Encounter for long-term (current) use of other medications, GERD (gastroesophageal reflux disease), Glaucoma, High blood sugar, History of bone density study, History of colonoscopy, History of echocardiogram, History of hiatal  hernia, History of Papanicolaou smear of cervix, Hyperlipidemia LDL goal < 100, Hypertension, Keratoconjunctivitis sicca, 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's, Other and unspecified hyperlipidemia, Pain in joint, site unspecified, Pre-diabetes, Routine general medical examination at a health care facility, Sinusitis, Sjogren's syndrome (10/27/2012), Tear film insufficiency, unspecified, Unspecified disorder of kidney and ureter, and Unspecified glaucoma(365.9). here with    No diagnosis found.  Dickey RAMAN Kohler ***.  Healthy lifestyle habits encouraged. *** will follow up with PCP as directed. *** will return to see me in ***, sooner if needed. *** verbalizes understanding and agreement with this plan.   No orders of the defined types were placed in this encounter.    No orders of the defined types were placed in this encounter.    Greig Forbes, MSN, FNP-C 01/30/2024, 8:18 AM  [facility] [address], [address] [place], [place] [address] [phone]

## 2024-01-31 ENCOUNTER — Encounter: Payer: Self-pay | Admitting: Family Medicine

## 2024-01-31 ENCOUNTER — Ambulatory Visit: Admitting: Family Medicine

## 2024-01-31 VITALS — BP 140/82 | HR 70 | Ht 66.0 in | Wt 165.8 lb

## 2024-01-31 DIAGNOSIS — I6502 Occlusion and stenosis of left vertebral artery: Secondary | ICD-10-CM

## 2024-01-31 NOTE — Patient Instructions (Signed)
 Below is our plan:  We will continue to monitor symptoms. Please continue aspirin  and rosuvastatin  daily per PCP for stroke prevention.   Goals:  1) Maintain strict control of hypertension with blood pressure goal below 130/90 2) Maintain good control of diabetes with hemoglobin A1c goal below 7%  3) Maintain good control of lipids with LDL cholesterol goal below 70 mg/dL.  4) Eat a healthy diet with plenty of whole grains, cereals, fruits and vegetables, exercise regularly and maintain ideal body weight   Resources: [email or web address]  Please make sure you are staying well hydrated. I recommend 50-60 ounces daily. Well balanced diet and regular exercise encouraged. Consistent sleep schedule with 6-8 hours recommended.   Please continue follow up with care team as directed.   Follow up with me as needed  You may receive a survey regarding today's visit. I encourage you to leave honest feed back as I do use this information to improve patient care. Thank you for seeing me today!

## 2024-02-02 DIAGNOSIS — M9903 Segmental and somatic dysfunction of lumbar region: Secondary | ICD-10-CM | POA: Diagnosis not present

## 2024-02-02 DIAGNOSIS — M9904 Segmental and somatic dysfunction of sacral region: Secondary | ICD-10-CM | POA: Diagnosis not present

## 2024-02-02 DIAGNOSIS — M9905 Segmental and somatic dysfunction of pelvic region: Secondary | ICD-10-CM | POA: Diagnosis not present

## 2024-02-02 DIAGNOSIS — M5136 Other intervertebral disc degeneration, lumbar region with discogenic back pain only: Secondary | ICD-10-CM | POA: Diagnosis not present

## 2024-02-07 ENCOUNTER — Other Ambulatory Visit (HOSPITAL_BASED_OUTPATIENT_CLINIC_OR_DEPARTMENT_OTHER): Payer: Self-pay

## 2024-02-08 DIAGNOSIS — M9904 Segmental and somatic dysfunction of sacral region: Secondary | ICD-10-CM | POA: Diagnosis not present

## 2024-02-08 DIAGNOSIS — M9905 Segmental and somatic dysfunction of pelvic region: Secondary | ICD-10-CM | POA: Diagnosis not present

## 2024-02-08 DIAGNOSIS — M5136 Other intervertebral disc degeneration, lumbar region with discogenic back pain only: Secondary | ICD-10-CM | POA: Diagnosis not present

## 2024-02-08 DIAGNOSIS — M9903 Segmental and somatic dysfunction of lumbar region: Secondary | ICD-10-CM | POA: Diagnosis not present

## 2024-02-13 ENCOUNTER — Encounter: Payer: Self-pay | Admitting: Radiology

## 2024-02-13 DIAGNOSIS — M5136 Other intervertebral disc degeneration, lumbar region with discogenic back pain only: Secondary | ICD-10-CM | POA: Diagnosis not present

## 2024-02-13 DIAGNOSIS — M9905 Segmental and somatic dysfunction of pelvic region: Secondary | ICD-10-CM | POA: Diagnosis not present

## 2024-02-13 DIAGNOSIS — M9903 Segmental and somatic dysfunction of lumbar region: Secondary | ICD-10-CM | POA: Diagnosis not present

## 2024-02-13 DIAGNOSIS — M9904 Segmental and somatic dysfunction of sacral region: Secondary | ICD-10-CM | POA: Diagnosis not present

## 2024-02-20 ENCOUNTER — Other Ambulatory Visit: Payer: Self-pay

## 2024-02-20 ENCOUNTER — Other Ambulatory Visit (HOSPITAL_BASED_OUTPATIENT_CLINIC_OR_DEPARTMENT_OTHER): Payer: Self-pay

## 2024-02-20 DIAGNOSIS — M5136 Other intervertebral disc degeneration, lumbar region with discogenic back pain only: Secondary | ICD-10-CM | POA: Diagnosis not present

## 2024-02-20 DIAGNOSIS — M9904 Segmental and somatic dysfunction of sacral region: Secondary | ICD-10-CM | POA: Diagnosis not present

## 2024-02-20 DIAGNOSIS — M9905 Segmental and somatic dysfunction of pelvic region: Secondary | ICD-10-CM | POA: Diagnosis not present

## 2024-02-20 DIAGNOSIS — M9903 Segmental and somatic dysfunction of lumbar region: Secondary | ICD-10-CM | POA: Diagnosis not present

## 2024-02-20 MED FILL — Metformin HCl Tab ER 24HR 500 MG: ORAL | 90 days supply | Qty: 90 | Fill #0 | Status: AC

## 2024-02-20 MED FILL — Metoprolol Succinate Tab ER 24HR 25 MG (Tartrate Equiv): ORAL | 90 days supply | Qty: 90 | Fill #0 | Status: AC

## 2024-02-22 ENCOUNTER — Other Ambulatory Visit (HOSPITAL_BASED_OUTPATIENT_CLINIC_OR_DEPARTMENT_OTHER): Payer: Self-pay

## 2024-02-23 ENCOUNTER — Other Ambulatory Visit (HOSPITAL_BASED_OUTPATIENT_CLINIC_OR_DEPARTMENT_OTHER): Payer: Self-pay

## 2024-02-23 DIAGNOSIS — M5136 Other intervertebral disc degeneration, lumbar region with discogenic back pain only: Secondary | ICD-10-CM | POA: Diagnosis not present

## 2024-02-23 DIAGNOSIS — M9905 Segmental and somatic dysfunction of pelvic region: Secondary | ICD-10-CM | POA: Diagnosis not present

## 2024-02-23 DIAGNOSIS — M9903 Segmental and somatic dysfunction of lumbar region: Secondary | ICD-10-CM | POA: Diagnosis not present

## 2024-02-23 DIAGNOSIS — M9904 Segmental and somatic dysfunction of sacral region: Secondary | ICD-10-CM | POA: Diagnosis not present

## 2024-02-24 ENCOUNTER — Other Ambulatory Visit (HOSPITAL_BASED_OUTPATIENT_CLINIC_OR_DEPARTMENT_OTHER): Payer: Self-pay

## 2024-02-28 DIAGNOSIS — M5416 Radiculopathy, lumbar region: Secondary | ICD-10-CM | POA: Diagnosis not present

## 2024-03-01 DIAGNOSIS — M9905 Segmental and somatic dysfunction of pelvic region: Secondary | ICD-10-CM | POA: Diagnosis not present

## 2024-03-01 DIAGNOSIS — M5136 Other intervertebral disc degeneration, lumbar region with discogenic back pain only: Secondary | ICD-10-CM | POA: Diagnosis not present

## 2024-03-01 DIAGNOSIS — M9903 Segmental and somatic dysfunction of lumbar region: Secondary | ICD-10-CM | POA: Diagnosis not present

## 2024-03-01 DIAGNOSIS — M9904 Segmental and somatic dysfunction of sacral region: Secondary | ICD-10-CM | POA: Diagnosis not present

## 2024-03-07 DIAGNOSIS — M9904 Segmental and somatic dysfunction of sacral region: Secondary | ICD-10-CM | POA: Diagnosis not present

## 2024-03-07 DIAGNOSIS — M9905 Segmental and somatic dysfunction of pelvic region: Secondary | ICD-10-CM | POA: Diagnosis not present

## 2024-03-07 DIAGNOSIS — M9903 Segmental and somatic dysfunction of lumbar region: Secondary | ICD-10-CM | POA: Diagnosis not present

## 2024-03-07 DIAGNOSIS — M5136 Other intervertebral disc degeneration, lumbar region with discogenic back pain only: Secondary | ICD-10-CM | POA: Diagnosis not present

## 2024-03-13 ENCOUNTER — Other Ambulatory Visit: Payer: Self-pay

## 2024-03-14 ENCOUNTER — Other Ambulatory Visit

## 2024-03-14 DIAGNOSIS — I10 Essential (primary) hypertension: Secondary | ICD-10-CM

## 2024-03-14 DIAGNOSIS — E119 Type 2 diabetes mellitus without complications: Secondary | ICD-10-CM

## 2024-03-14 DIAGNOSIS — E785 Hyperlipidemia, unspecified: Secondary | ICD-10-CM | POA: Diagnosis not present

## 2024-03-14 DIAGNOSIS — E1169 Type 2 diabetes mellitus with other specified complication: Secondary | ICD-10-CM | POA: Diagnosis not present

## 2024-03-14 LAB — CBC WITH DIFFERENTIAL/PLATELET
Absolute Lymphocytes: 1762 {cells}/uL (ref 850–3900)
Absolute Monocytes: 757 {cells}/uL (ref 200–950)
Basophils Absolute: 107 {cells}/uL (ref 0–200)
Basophils Relative: 1.2 %
Eosinophils Absolute: 490 {cells}/uL (ref 15–500)
Eosinophils Relative: 5.5 %
HCT: 46.3 % — ABNORMAL HIGH (ref 35.9–46.0)
Hemoglobin: 15.1 g/dL (ref 11.7–15.5)
MCH: 30.5 pg (ref 27.0–33.0)
MCHC: 32.6 g/dL (ref 31.6–35.4)
MCV: 93.5 fL (ref 81.4–101.7)
MPV: 10.9 fL (ref 7.5–12.5)
Monocytes Relative: 8.5 %
Neutro Abs: 5785 {cells}/uL (ref 1500–7800)
Neutrophils Relative %: 65 %
Platelets: 299 Thousand/uL (ref 140–400)
RBC: 4.95 Million/uL (ref 3.80–5.10)
RDW: 12.2 % (ref 11.0–15.0)
Total Lymphocyte: 19.8 %
WBC: 8.9 Thousand/uL (ref 3.8–10.8)

## 2024-03-14 LAB — COMPLETE METABOLIC PANEL WITHOUT GFR
AG Ratio: 1.9 (calc) (ref 1.0–2.5)
ALT: 14 U/L (ref 6–29)
AST: 17 U/L (ref 10–35)
Albumin: 4.6 g/dL (ref 3.6–5.1)
Alkaline phosphatase (APISO): 41 U/L (ref 37–153)
BUN: 11 mg/dL (ref 7–25)
CO2: 27 mmol/L (ref 20–32)
Calcium: 10 mg/dL (ref 8.6–10.4)
Chloride: 96 mmol/L — ABNORMAL LOW (ref 98–110)
Creat: 0.75 mg/dL (ref 0.60–1.00)
Globulin: 2.4 g/dL (ref 1.9–3.7)
Glucose, Bld: 124 mg/dL — ABNORMAL HIGH (ref 65–99)
Potassium: 5 mmol/L (ref 3.5–5.3)
Sodium: 132 mmol/L — ABNORMAL LOW (ref 135–146)
Total Bilirubin: 0.9 mg/dL (ref 0.2–1.2)
Total Protein: 7 g/dL (ref 6.1–8.1)

## 2024-03-14 LAB — HEMOGLOBIN A1C
Hgb A1c MFr Bld: 6.1 % — ABNORMAL HIGH (ref <5.7)
Mean Plasma Glucose: 128 mg/dL
eAG (mmol/L): 7.1 mmol/L

## 2024-03-14 LAB — LIPID PANEL
Cholesterol: 123 mg/dL (ref <200)
HDL: 43 mg/dL — ABNORMAL LOW (ref >=50)
LDL Cholesterol (Calc): 58 mg/dL
Non-HDL Cholesterol (Calc): 80 mg/dL (ref <130)
Total CHOL/HDL Ratio: 2.9 (calc) (ref <5.0)
Triglycerides: 139 mg/dL (ref <150)

## 2024-03-15 ENCOUNTER — Encounter: Payer: Self-pay | Admitting: Orthopedic Surgery

## 2024-03-15 ENCOUNTER — Ambulatory Visit: Payer: Self-pay | Admitting: Orthopedic Surgery

## 2024-03-15 VITALS — BP 126/80 | HR 68 | Temp 97.2°F | Ht 66.0 in | Wt 166.8 lb

## 2024-03-15 DIAGNOSIS — E785 Hyperlipidemia, unspecified: Secondary | ICD-10-CM | POA: Diagnosis not present

## 2024-03-15 DIAGNOSIS — E1165 Type 2 diabetes mellitus with hyperglycemia: Secondary | ICD-10-CM

## 2024-03-15 DIAGNOSIS — I10 Essential (primary) hypertension: Secondary | ICD-10-CM

## 2024-03-15 DIAGNOSIS — N3281 Overactive bladder: Secondary | ICD-10-CM | POA: Diagnosis not present

## 2024-03-15 DIAGNOSIS — M5416 Radiculopathy, lumbar region: Secondary | ICD-10-CM

## 2024-03-15 DIAGNOSIS — Z7984 Long term (current) use of oral hypoglycemic drugs: Secondary | ICD-10-CM | POA: Diagnosis not present

## 2024-03-15 DIAGNOSIS — E1169 Type 2 diabetes mellitus with other specified complication: Secondary | ICD-10-CM | POA: Diagnosis not present

## 2024-03-15 DIAGNOSIS — Z23 Encounter for immunization: Secondary | ICD-10-CM

## 2024-03-15 NOTE — Progress Notes (Signed)
 Careteam: Patient Care Team: Felicia Greig BRAVO, NP as PCP - General (Adult Health Nurse Practitioner) Felicia Lonni CROME, MD as PCP - Cardiology (Cardiology) Felicia Molly, MD as Consulting Physician (Ophthalmology) [place], [facility]  Seen by: Greig Felicia, AGNP-C  PLACE OF SERVICE:  [facility]  Advanced Directive information    Allergies  Allergen Reactions   Statins Other (See Comments)    Lovastatin Muscle problems   Allopurinol  Rash   Darvocet [Propoxyphene N-Acetaminophen ] Rash    Chief Complaint  Patient presents with   Follow-up    6 month follow up     HPI: Patient is a 78 y.o. female seen today for medical management of chronic conditions.   Discussed the use of AI scribe software for clinical note transcription with the patient, who gave verbal consent to proceed.  History of Present Illness   Felicia Hall is a 78 year old female with prediabetes, chronic back pain, and overactive bladder who presents for a follow-up visit.  Her A1c has improved from 6.4 six months ago to 6.1 currently, remaining in the prediabetes range. She is taking metformin  as prescribed. Foot exam normal today. She if followed by [facility] and had 2 eye exams this year by Felicia. Charmayne.   She continues to take rosuvastatin  for cholesterol management, with an LDL of 58.  Her recent metabolic panel showed slightly low sodium levels. She drinks a lot of water. Her blood counts are normal.  She experiences chronic low back pain, which she attributes to possible scar tissue from previous surgery. A prednisone  injection in October provided significant relief, but symptoms are returning as the effect wears off. The pain is primarily at night, worsens when lying on her left side, and does not radiate down her leg. She has had an MRI and is awaiting further consultation with her neurosurgeon.  She has ongoing issues with overactive bladder and urinary leakage. She has tried  various treatments, including medications like Gemtesa  and Myrbetriq , neurostimulation, and physical therapy with Kegel exercises, but has not found significant relief. She feels frustrated and a sense of giving up on finding an effective treatment.  Her family history includes a mother who had breast cancer, but it was caught early and managed successfully. She has no other significant family history of breast cancer or other conditions that would warrant genetic testing. She has had a recent mammogram in October, which was normal.        Felicia Hall opthalamology   Review of Systems:  Review of Systems  Constitutional: Negative.   HENT: Negative.    Eyes: Negative.   Respiratory: Negative.    Cardiovascular: Negative.   Gastrointestinal: Negative.   Genitourinary:  Positive for frequency.  Musculoskeletal:  Positive for back pain and joint pain. Negative for falls and myalgias.  Skin: Negative.   Neurological: Negative.   Endo/Heme/Allergies:  Negative for polydipsia.  Psychiatric/Behavioral: Negative.      Past Medical History:  Diagnosis Date   Acute upper respiratory infections of unspecified site    Allergic rhinitis due to pollen    Arthritis    Benign essential hypertension    Bursitis    Cervical spondylosis without myelopathy    Cervicalgia    Controlled type 2 diabetes mellitus without complication, without long-term current use of insulin  (HCC) 10/01/2019   Diaphragmatic hernia without mention of obstruction or gangrene    Disorder of bone and cartilage, unspecified    Diverticulosis of colon (without  mention of hemorrhage)    Encounter for long-term (current) use of other medications    GERD (gastroesophageal reflux disease)    Glaucoma    open angle   High blood sugar    History of bone density study    History of colonoscopy    History of echocardiogram    History of hiatal hernia    History of Papanicolaou smear of cervix    Hyperlipidemia LDL goal <  100    Hypertension    Keratoconjunctivitis sicca, 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's    Other and unspecified hyperlipidemia    Pain in joint, site unspecified    Pre-diabetes    Routine general medical examination at a health care facility    Sinusitis    Sjogren's syndrome 10/27/2012   Tear film insufficiency, unspecified    Unspecified disorder of kidney and ureter    Unspecified glaucoma(365.9)    Past Surgical History:  Procedure Laterality Date   BACK SURGERY     BELPHAROPTOSIS REPAIR     brow lift   bil   CATARACT EXTRACTION W/ INTRAOCULAR LENS  IMPLANT, BILATERAL Bilateral 12/2014   Felicia. Charmayne   CHOLECYSTECTOMY  1993   Felicia.Lindsey    DILATION AND CURETTAGE OF UTERUS     GALLBLADDER SURGERY     LUMBAR LAMINECTOMY/DECOMPRESSION MICRODISCECTOMY N/A 04/21/2017   Procedure: BILATERAL LUMBAR FOUR- LUMBAR FIVE AND LEFT LUMBAR FIVE- SACRAL ONE LAMINOTOMY, MEDIAL FACETECTOMY AND FORAMINOTOMIES;  Surgeon: Alix Charleston, MD;  Location: [facility] OR;  Service: Neurosurgery;  Laterality: N/A;   NASAL SEPTUM SURGERY     Social History:   reports that she has never smoked. She has never used smokeless tobacco. She reports current alcohol  use. She reports that she does not use drugs.  Family History  Problem Relation Age of Onset   Heart attack Mother    Breast cancer Mother    Heart failure Mother    Pancreatitis Father    Diabetes Brother    Heart disease Brother        By-pass surgery   Lupus Cousin     Medications: Patient's Medications  New Prescriptions   No medications on file  Previous Medications   ACETAMINOPHEN  (TYLENOL ) 650 MG CR TABLET    Take 1,300 mg by mouth every 8 (eight) hours as needed for pain.   ASPIRIN  EC 81 MG TABLET    Take 1 tablet (81 mg total) by mouth daily. Swallow whole.   CHLORPHENIRAMINE (CHLOR-TRIMETON) 4 MG TABLET    Take 4 mg by mouth daily.   CHOLECALCIFEROL  (VITAMIN D ) 1000 UNITS TABLET    Take 2,000 Units by mouth daily.    EZETIMIBE  (ZETIA ) 10 MG  TABLET    Take 1 tablet (10 mg total) by mouth daily.   LOSARTAN  (COZAAR ) 100 MG TABLET    TAKE 1 TABLET BY MOUTH EVERY DAY   METFORMIN  (GLUCOPHAGE -XR) 500 MG 24 HR TABLET    Take 1 tablet (500 mg total) by mouth daily with breakfast.   METOPROLOL  SUCCINATE (TOPROL -XL) 25 MG 24 HR TABLET    Take 1 tablet (25 mg total) by mouth daily.   PANTOPRAZOLE  (PROTONIX ) 40 MG TABLET    Take 1 tablet (40 mg total) by mouth daily.   ROSUVASTATIN  (CRESTOR ) 10 MG TABLET    Take 1 tablet (10 mg total) by mouth daily.   TIMOLOL  (TIMOPTIC ) 0.5 % OPHTHALMIC SOLUTION    Place 1 drop into both eyes daily.  Modified Medications   No medications  on file  Discontinued Medications   No medications on file    Physical Exam:  Vitals:   03/15/24 0951  BP: 126/80  Pulse: 68  Temp: (!) 97.2 F (36.2 C)  SpO2: 99%  Weight: 166 lb 12.8 oz (75.7 kg)  Height: 5' 6 (1.676 m)   Body mass index is 26.92 kg/m. Wt Readings from Last 3 Encounters:  03/15/24 166 lb 12.8 oz (75.7 kg)  01/31/24 165 lb 12.8 oz (75.2 kg)  10/05/23 167 lb 12.8 oz (76.1 kg)    Physical Exam Vitals reviewed.  Constitutional:      General: She is not in acute distress. HENT:     Head: Normocephalic.  Eyes:     General:        Right eye: No discharge.        Left eye: No discharge.  Cardiovascular:     Rate and Rhythm: Normal rate and regular rhythm.     Pulses: Normal pulses.     Heart sounds: Normal heart sounds.  Pulmonary:     Effort: Pulmonary effort is normal.     Breath sounds: Normal breath sounds.  Abdominal:     General: Bowel sounds are normal. There is no distension.     Palpations: Abdomen is soft.     Tenderness: There is no abdominal tenderness.  Musculoskeletal:     Cervical back: Neck supple.     Right lower leg: No edema.     Left lower leg: No edema.  Skin:    General: Skin is warm.     Capillary Refill: Capillary refill takes less than 2 seconds.  Neurological:     General: No focal deficit present.      Mental Status: She is alert and oriented to person, place, and time.     Gait: Gait normal.  Psychiatric:        Mood and Affect: Mood normal.     Labs reviewed: Basic Metabolic Panel: Recent Labs    05/10/23 0423 08/30/23 0808 03/14/24 0816  NA 135 136 132*  K 4.0 4.6 5.0  CL 104 100 96*  CO2 23 26 27   GLUCOSE 117* 113* 124*  BUN 9 11 11   CREATININE 0.87 0.74 0.75  CALCIUM  9.0 9.8 10.0   Liver Function Tests: Recent Labs    08/30/23 0808 03/14/24 0816  AST 16 17  ALT 16 14  BILITOT 0.9 0.9  PROT 7.0 7.0   No results for input(s): LIPASE, AMYLASE in the last 8760 hours. No results for input(s): AMMONIA in the last 8760 hours. CBC: Recent Labs    05/10/23 0423 08/30/23 0808 03/14/24 0816  WBC 9.6 7.5 8.9  NEUTROABS 6.0 4,620 5,785  HGB 13.5 14.9 15.1  HCT 40.3 46.1* 46.3*  MCV 91.8 94.1 93.5  PLT 216 269 299   Lipid Panel: Recent Labs    05/09/23 0950 08/30/23 0808 03/14/24 0816  CHOL 112 129 123  HDL 39* 42* 43*  LDLCALC 50 64 58  TRIG 116 155* 139  CHOLHDL 2.9 3.1 2.9   TSH: No results for input(s): TSH in the last 8760 hours. A1C: Lab Results  Component Value Date   HGBA1C 6.1 (H) 03/14/2024     Assessment/Plan 1. Immunization due (Primary) - Flu vaccine HIGH DOSE PF(Fluzone Trivalent)  2. Type 2 diabetes mellitus with hyperglycemia, without long-term current use of insulin  (HCC) - recent A1c improved to 6.1> was 6.4 - no hypoglycemia - foot exam normal today - diabetic eye  exam requested from Felicia. Charmayne office - cont metformin   - Hemoglobin A1c; Future - Microalbumin/Creatinine Ratio, Urine; Future  3. Benign essential hypertension - BUN/creat 11/0.75 03/14/2024 - cont metoprolol  - CBC with Differential/Platelet; Future - Complete Metabolic Panel with eGFR; Future - TSH; Future  4. Hyperlipidemia due to type 2 diabetes mellitus (HCC) - LDL 58, goal < 70 - cont Zetia  and rosuvastatin  - Lipid Panel; Future  5.  Lumbar radiculopathy - followed by Felicia. Malcolm - recent MRI done  - ? Pain from scar tissue related to past surgery  - 01/2024 pain improved with steroid injection   6. Overactive bladder - several unsuccessful interventions including medication, nerve stimulation and PT - followed by urology    Total time: 35 minutes. Greater than 50% of total time spent doing patient education regarding health maintenance, T2DM, HTN, back pain, HLD and OAB including symptom/medication management.    Next appt: Visit date not found  Felicia Hall  [facility] [phone]

## 2024-03-15 NOTE — Patient Instructions (Addendum)
 Please have Dr. Malcolm send notes to [facility]

## 2024-03-20 DIAGNOSIS — M5416 Radiculopathy, lumbar region: Secondary | ICD-10-CM | POA: Diagnosis not present

## 2024-03-22 ENCOUNTER — Other Ambulatory Visit (HOSPITAL_BASED_OUTPATIENT_CLINIC_OR_DEPARTMENT_OTHER): Payer: Self-pay

## 2024-03-22 DIAGNOSIS — M9903 Segmental and somatic dysfunction of lumbar region: Secondary | ICD-10-CM | POA: Diagnosis not present

## 2024-03-22 DIAGNOSIS — M9905 Segmental and somatic dysfunction of pelvic region: Secondary | ICD-10-CM | POA: Diagnosis not present

## 2024-03-22 DIAGNOSIS — M5136 Other intervertebral disc degeneration, lumbar region with discogenic back pain only: Secondary | ICD-10-CM | POA: Diagnosis not present

## 2024-03-22 DIAGNOSIS — M9904 Segmental and somatic dysfunction of sacral region: Secondary | ICD-10-CM | POA: Diagnosis not present

## 2024-03-26 ENCOUNTER — Other Ambulatory Visit (HOSPITAL_BASED_OUTPATIENT_CLINIC_OR_DEPARTMENT_OTHER): Payer: Self-pay

## 2024-03-29 ENCOUNTER — Other Ambulatory Visit (HOSPITAL_BASED_OUTPATIENT_CLINIC_OR_DEPARTMENT_OTHER): Payer: Self-pay

## 2024-03-29 ENCOUNTER — Other Ambulatory Visit: Payer: Self-pay | Admitting: Cardiology

## 2024-03-30 ENCOUNTER — Other Ambulatory Visit (HOSPITAL_BASED_OUTPATIENT_CLINIC_OR_DEPARTMENT_OTHER): Payer: Self-pay

## 2024-03-30 MED ORDER — LOSARTAN POTASSIUM 100 MG PO TABS
100.0000 mg | ORAL_TABLET | Freq: Every day | ORAL | 2 refills | Status: AC
  Filled 2024-03-30: qty 90, 90d supply, fill #0

## 2024-03-31 ENCOUNTER — Other Ambulatory Visit (HOSPITAL_BASED_OUTPATIENT_CLINIC_OR_DEPARTMENT_OTHER): Payer: Self-pay

## 2024-03-31 MED FILL — Rosuvastatin Calcium Tab 10 MG: ORAL | 90 days supply | Qty: 90 | Fill #0 | Status: AC

## 2024-04-02 DIAGNOSIS — M5136 Other intervertebral disc degeneration, lumbar region with discogenic back pain only: Secondary | ICD-10-CM | POA: Diagnosis not present

## 2024-04-02 DIAGNOSIS — M9903 Segmental and somatic dysfunction of lumbar region: Secondary | ICD-10-CM | POA: Diagnosis not present

## 2024-04-02 DIAGNOSIS — M9904 Segmental and somatic dysfunction of sacral region: Secondary | ICD-10-CM | POA: Diagnosis not present

## 2024-04-02 DIAGNOSIS — M9905 Segmental and somatic dysfunction of pelvic region: Secondary | ICD-10-CM | POA: Diagnosis not present

## 2024-04-03 NOTE — Progress Notes (Signed)
 Felicia Hall                                          MRN: [id number]   04/03/2024   The [facility] Quality Team Specialist reviewed this patient medical record for the purposes of chart review for care gap closure. The following were reviewed: abstraction for care gap closure-controlling blood pressure.    [facility] Quality Team

## 2024-04-09 NOTE — Progress Notes (Unsigned)
 " OUTPATIENT PHYSICAL THERAPY THORACOLUMBAR EVALUATION   Patient Name: Felicia Hall MRN: [id number] DOB:11/10/1945, 78 y.o., female Today's Date: 04/10/2024  END OF SESSION:  PT End of Session - 04/10/24 1143     Visit Number 1    Date for Recertification  06/05/24    Progress Note Due on Visit 10    PT Start Time 1142    PT Stop Time 1228    PT Time Calculation (min) 46 min    Activity Tolerance Patient tolerated treatment well    Behavior During Therapy [facility] for tasks assessed/performed          Past Medical History:  Diagnosis Date   Acute upper respiratory infections of unspecified site    Allergic rhinitis due to pollen    Arthritis    Benign essential hypertension    Bursitis    Cervical spondylosis without myelopathy    Cervicalgia    Controlled type 2 diabetes mellitus without complication, without long-term current use of insulin  (HCC) 10/01/2019   Diaphragmatic hernia without mention of obstruction or gangrene    Disorder of bone and cartilage, unspecified    Diverticulosis of colon (without mention of hemorrhage)    Encounter for long-term (current) use of other medications    GERD (gastroesophageal reflux disease)    Glaucoma    open angle   High blood sugar    History of bone density study    History of colonoscopy    History of echocardiogram    History of hiatal hernia    History of Papanicolaou smear of cervix    Hyperlipidemia LDL goal < 100    Hypertension    Keratoconjunctivitis sicca, 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's    Other and unspecified hyperlipidemia    Pain in joint, site unspecified    Pre-diabetes    Routine general medical examination at a health care facility    Sinusitis    Sjogren's syndrome 10/27/2012   Tear film insufficiency, unspecified    Unspecified disorder of kidney and ureter    Unspecified glaucoma(365.9)    Past Surgical History:  Procedure Laterality Date   BACK SURGERY     BELPHAROPTOSIS REPAIR     brow lift   bil    CATARACT EXTRACTION W/ INTRAOCULAR LENS  IMPLANT, BILATERAL Bilateral 12/2014   Dr. 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   Overactive bladder 04/21/2023   SUI (stress urinary incontinence, female) 04/21/2023   Vaginal atrophy 04/21/2023   Impacted cerumen of left ear 12/03/2022   Controlled type 2 diabetes mellitus without complication, without long-term current use of insulin  (HCC) 10/01/2019   Trochanteric bursitis 10/01/2019   Vertigo 10/01/2019   Palpitations 10/01/2019   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   Benign essential tremor 01/03/2017   Irritable bowel syndrome with constipation 08/09/2016   Neck pain, chronic 08/09/2016   Spinal stenosis in cervical region 08/09/2016   Generalized constriction of visual field 06/22/2016   Chronic  pansinusitis 10/17/2015   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   Perennial allergic rhinitis 10/17/2015   Chronic pain syndrome 04/30/2013   Hyperglycemia 04/30/2013   Low back pain with right-sided sciatica 04/30/2013   Osteopenia, 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   Family history of colonic polyps 04/30/2013   Sjogren's syndrome 10/27/2012   Keratoconjunctivitis sicca, 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's    Encounter for long-term (current) use of other medications    Disorder of bone and cartilage    Allergic rhinitis due to pollen    Cervical spondylosis without myelopathy    Unspecified glaucoma    Benign  essential hypertension    Mixed hyperlipidemia     PCP: Gil Greig DRAFTS NP  REFERRING PROVIDER: Louis Shove, MD,   REFERRING DIAG: Lumbar radiculopathy , with spondylosis  Rationale for Evaluation and Treatment: Rehabilitation  THERAPY DIAG:  Chronic bilateral low back pain with left-sided sciatica  Stiffness of left hip, not elsewhere classified  Stiffness of right hip, not elsewhere classified  Muscle weakness (generalized)  ONSET DATE: several years   SUBJECTIVE:                                                                                                                                                                                           SUBJECTIVE STATEMENT: Reports chronic L sided lower back , glut pain, worse with attempting to sleep, asked orthopedist about it then was referred back to surgeon who referred her to PT.  Had steroid injection 1 1/2 weeks ago without any improvement in Sx  PERTINENT HISTORY:  Chronic LS spine pain and radicular pain L post hips  PAIN:  Are you having pain? Yes: NPRS scale: 2 to 8 Pain location: central lumbar, and L gluteals, SITS bone Pain description: deep pain. burn Aggravating factors: lying down aggravates Relieving factors: tylenol  arthritis  PRECAUTIONS: None  RED FLAGS: None   WEIGHT BEARING RESTRICTIONS: No  FALLS:  Has patient fallen in last 6 months? no  LIVING ENVIRONMENT: Lives with: lives alone Lives in: House/apartment Stairs: No Has following equipment at home: None  OCCUPATION: retired  PLOF: Independent  PATIENT GOALS: get better sleep and quality of life  NEXT MD VISIT: unclear  OBJECTIVE:  Note: Objective measures were completed at Evaluation unless otherwise noted.  DIAGNOSTIC FINDINGS:  Per referring MD notes: Foraminal narrowing L5/ S1 L.  Multilevel degenerative changes L2 to S1.  No instability noted with flex, ext radiographs  PATIENT SURVEYS:  Modified Oswestry Low Back Pain  Disability Questionnaire: 17 / 50 = 34.0 %  COGNITION: Overall cognitive status: Within functional limits for tasks assessed     SENSATION: [facility] [place]  MUSCLE LENGTH: Hamstrings: Right wnl deg; Left wnl deg Debby test: Right wfl deg; Left wfl deg  POSTURE: R iliac crest elevated with some concavity R lumbar region, loss of lumbar lordosis , loss of gluteal mass B  PALPATION: Exquisitely tender L posterior SI jt line and L glut max, glut med, piriformis  LUMBAR ROM:   AROM eval  Flexion 50  Extension 30  Right lateral flexion 20  Left lateral flexion 20  Right rotation   Left rotation    (Blank rows = not tested)  LOWER EXTREMITY ROM:     Active  Right eval Left eval  Hip flexion    Hip extension    Hip abduction    Hip adduction    Hip internal rotation wfl Wfl but p!  Hip external rotation -15 -15  Knee flexion    Knee extension    Ankle dorsiflexion    Ankle plantarflexion    Ankle inversion    Ankle eversion     (Blank rows = not tested)  LOWER EXTREMITY MMT:    MMT Right eval Left eval  Hip flexion    Hip extension    Hip abduction 4 4  Hip adduction    Hip internal rotation 3+ 2+  Hip external rotation 4+ 4+  Knee flexion 4+ 4+  Knee extension 4_+ 4+  Ankle dorsiflexion    Ankle plantarflexion toe walking unable unable  Ankle inversion    Ankle eversion     (Blank rows = not tested)  LUMBAR SPECIAL TESTS:  Straight leg raise test: Negative  FUNCTIONAL TESTS:  30 seconds chair stand test  9  GAIT: Distance walked: in clinic up to 90' Assistive device utilized: None Level of assistance: Complete Independence   TREATMENT DATE: 04/10/24                                                                                                                              Eval, POC, inst in therex / stretches as described below:  Standing B heel raises with t band around ankles to engage B hip IR strength Attempted supine LTR with thighs over physioball  but did not tolerate position well, too tender with lying with L SI area on table   PATIENT EDUCATION:  Education details: POC, goals Person educated: Patient Education method: Explanation, Demonstration, Tactile cues, and Verbal cues Education comprehension: verbalized understanding, returned demonstration, verbal cues required, tactile cues required, and needs further education  HOME EXERCISE PROGRAM: Access Code: [email or web address] URL: [email or web address] Date: 04/10/2024 Prepared by: Tyshun Tuckerman  Exercises - Sidelying Lumbar Rotation Stretch  - 1 x daily - 7 x weekly - 3 sets - 10 reps  ASSESSMENT:  CLINICAL IMPRESSION: Patient is a 78 y.o. female who was evaluated today by physical therapy for chronic lower back pain and L L5 S1 radiculopathy. She presents with stiffness throughout lumbar spine and B hips, as well as B hip weakness especially for  hip IR.  Today we addressed specific stretch to open L L5 S1 facet jt as well as initiated hip IR strengthening .  She may benefit from manual techniques to improve her gluteal and L post hip musculature tenderness /  also may benefit from a TENS trial  OBJECTIVE IMPAIRMENTS: decreased activity tolerance, decreased balance, decreased endurance, decreased mobility, difficulty walking, decreased strength, impaired perceived functional ability, postural dysfunction, and pain.   ACTIVITY LIMITATIONS: carrying, lifting, bending, squatting, sleeping, hygiene/grooming, and locomotion level  PARTICIPATION LIMITATIONS: meal prep, cleaning, laundry, shopping, and community activity  PERSONAL FACTORS: Behavior pattern, Past/current experiences, Time since onset of injury/illness/exacerbation, and 1-2 comorbidities: osteoarthritis multiple sites,  are also affecting patient's functional outcome.   REHAB POTENTIAL: Good  CLINICAL DECISION MAKING: Evolving/moderate complexity  EVALUATION COMPLEXITY: Moderate   GOALS: Goals reviewed with  patient? Yes  SHORT TERM GOALS: Target date: 2 weeks, 04/23/24  I HEP Baseline: Goal status: INITIAL  LONG TERM GOALS: Target date: 06/05/24, 8 weeks   Modified oswestry improve from 17 / 50 = 34.0 % to 7 50 or less Baseline:  Goal status: INITIAL  2.  Strength B hips for IR, Abd improve to 4_+/5 without pain Baseline:  Goal status: INITIAL  3.  Improve LS spine flex, ext, side bending motion to 50 % all planes for improve static positioning in bed Baseline:  Goal status: INITIAL  4.  30 sec sit to stand greater than 11 reps improve from 9 Baseline:  Goal status: INITIAL  PLAN:  PT FREQUENCY: 1x/week  PT DURATION: 8 weeks  PLANNED INTERVENTIONS: 97110-Therapeutic exercises, 97530- Therapeutic activity, [id number]- Neuromuscular re-education, 97535- Self Care, [id number]- Manual therapy, and Patient/Family education.  PLAN FOR NEXT SESSION: assess strength B hips and flexibility LS spine, manual techniques, theragun, trial of estim for pain L post gluteals   Tanairi Cypert L Yetta Marceaux, PT, DPT< OCS 04/10/2024, 4:40 PM  "

## 2024-04-10 ENCOUNTER — Other Ambulatory Visit: Payer: Self-pay

## 2024-04-10 ENCOUNTER — Ambulatory Visit: Attending: Neurosurgery

## 2024-04-10 DIAGNOSIS — M5442 Lumbago with sciatica, left side: Secondary | ICD-10-CM | POA: Diagnosis present

## 2024-04-10 DIAGNOSIS — G8929 Other chronic pain: Secondary | ICD-10-CM | POA: Diagnosis present

## 2024-04-10 DIAGNOSIS — M6281 Muscle weakness (generalized): Secondary | ICD-10-CM | POA: Diagnosis present

## 2024-04-10 DIAGNOSIS — M25652 Stiffness of left hip, not elsewhere classified: Secondary | ICD-10-CM | POA: Insufficient documentation

## 2024-04-10 DIAGNOSIS — M25651 Stiffness of right hip, not elsewhere classified: Secondary | ICD-10-CM | POA: Diagnosis present

## 2024-04-11 NOTE — Addendum Note (Signed)
 Addended byBETHA CREDIT, Viraat Vanpatten L on: 04/11/2024 12:41 PM   Modules accepted: Orders

## 2024-04-23 ENCOUNTER — Encounter: Payer: Self-pay | Admitting: *Deleted

## 2024-04-24 ENCOUNTER — Ambulatory Visit: Attending: Neurosurgery

## 2024-04-24 ENCOUNTER — Other Ambulatory Visit: Payer: Self-pay

## 2024-04-24 DIAGNOSIS — M25652 Stiffness of left hip, not elsewhere classified: Secondary | ICD-10-CM | POA: Insufficient documentation

## 2024-04-24 DIAGNOSIS — M25651 Stiffness of right hip, not elsewhere classified: Secondary | ICD-10-CM | POA: Diagnosis present

## 2024-04-24 DIAGNOSIS — G8929 Other chronic pain: Secondary | ICD-10-CM | POA: Diagnosis present

## 2024-04-24 DIAGNOSIS — M6281 Muscle weakness (generalized): Secondary | ICD-10-CM | POA: Diagnosis present

## 2024-04-24 DIAGNOSIS — M5442 Lumbago with sciatica, left side: Secondary | ICD-10-CM | POA: Insufficient documentation

## 2024-04-24 NOTE — Therapy (Signed)
 OUTPATIENT PHYSICAL THERAPY THORACOLUMBAR EVALUATION     Patient Name: Felicia Hall MRN: [id number] DOB:September 25, 1945, 79 y.o., female Today's Date: 04/10/2024   END OF SESSION:  PT End of Session - 04/24/24 1436     Visit Number 2    Date for Recertification  06/05/24    Progress Note Due on Visit 10    PT Start Time 1100    PT Stop Time 1145    PT Time Calculation (min) 45 min    Activity Tolerance Patient tolerated treatment well    Behavior During Therapy [facility] for tasks assessed/performed           PT End of Session - 04/24/24 1436     Visit Number 2    Date for Recertification  06/05/24    Progress Note Due on Visit 10    PT Start Time 1100    PT Stop Time 1145    PT Time Calculation (min) 45 min    Activity Tolerance Patient tolerated treatment well    Behavior During Therapy [facility] for tasks assessed/performed            PT End of Session - 04/24/24      Visit Number 2    Date for Recertification  06/05/24     Progress Note Due on Visit 10     PT Start Time 1100    PT Stop Time 1145    PT Time Calculation (min) 45 min     Activity Tolerance Patient tolerated treatment well     Behavior During Therapy [facility] for tasks assessed/performed                  Past Medical History:  Diagnosis Date   Acute upper respiratory infections of unspecified site     Allergic rhinitis due to pollen     Arthritis     Benign essential hypertension     Bursitis     Cervical spondylosis without myelopathy     Cervicalgia     Controlled type 2 diabetes mellitus without complication, without long-term current use of insulin  (HCC) 10/01/2019   Diaphragmatic hernia without mention of obstruction or gangrene     Disorder of bone and cartilage, unspecified     Diverticulosis of colon (without mention of hemorrhage)     Encounter for long-term (current) use of other medications     GERD (gastroesophageal reflux disease)     Glaucoma      open angle   High blood sugar     History  of bone density study     History of colonoscopy     History of echocardiogram     History of hiatal hernia     History of Papanicolaou smear of cervix     Hyperlipidemia LDL goal < 100     Hypertension     Keratoconjunctivitis sicca, 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's     Other and unspecified hyperlipidemia     Pain in joint, site unspecified     Pre-diabetes     Routine general medical examination at a health care facility     Sinusitis     Sjogren's syndrome 10/27/2012   Tear film insufficiency, unspecified     Unspecified disorder of kidney and ureter     Unspecified glaucoma(365.9)               Past Surgical History:  Procedure Laterality Date   BACK SURGERY       BELPHAROPTOSIS REPAIR  brow lift   bil   CATARACT EXTRACTION W/ INTRAOCULAR LENS  IMPLANT, BILATERAL Bilateral 12/2014    Dr. 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   Overactive bladder 04/21/2023   SUI (stress urinary incontinence, female) 04/21/2023   Vaginal atrophy 04/21/2023   Impacted cerumen of left ear 12/03/2022   Controlled type 2 diabetes mellitus without complication, without long-term current use of insulin  (HCC) 10/01/2019   Trochanteric bursitis 10/01/2019   Vertigo 10/01/2019   Palpitations 10/01/2019   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   Benign essential tremor 01/03/2017   Irritable bowel syndrome with constipation  08/09/2016   Neck pain, chronic 08/09/2016   Spinal stenosis in cervical region 08/09/2016   Generalized constriction of visual field 06/22/2016   Chronic pansinusitis 10/17/2015   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   Perennial allergic rhinitis 10/17/2015   Chronic pain syndrome 04/30/2013   Hyperglycemia 04/30/2013   Low back pain with right-sided sciatica 04/30/2013   Osteopenia, 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   Family history of colonic polyps 04/30/2013   Sjogren's syndrome 10/27/2012   Keratoconjunctivitis sicca, 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's     Encounter for long-term (current) use of other medications     Disorder of bone and cartilage     Allergic rhinitis due to pollen     Cervical spondylosis without myelopathy     Unspecified glaucoma     Benign essential hypertension     Mixed hyperlipidemia        PCP: Gil Greig DRAFTS NP   REFERRING PROVIDER: Louis Shove, MD,    REFERRING DIAG: Lumbar radiculopathy , with spondylosis   Rationale for Evaluation and Treatment: Rehabilitation   THERAPY DIAG:  Chronic bilateral low back pain with left-sided sciatica   Stiffness of left hip, not elsewhere classified   Stiffness of right hip, not elsewhere classified   Muscle weakness (generalized)   ONSET DATE: several years    SUBJECTIVE:  SUBJECTIVE STATEMENT:04/24/24: no much different had some problems with both of the exercises that you showed me at first visit.   Reports chronic L sided lower back , glut pain, worse with attempting to sleep, asked orthopedist about it then was referred back to surgeon who referred her to PT.  Had steroid injection 1 1/2 weeks ago without any improvement in Sx   PERTINENT HISTORY:  Chronic LS spine pain and radicular pain L post hips   PAIN:  Are you having pain? Yes:  NPRS scale: 2 to 8 Pain location: central lumbar, and L gluteals, SITS bone Pain description: deep pain. burn Aggravating factors: lying down aggravates Relieving factors: tylenol  arthritis   PRECAUTIONS: None   RED FLAGS: None      WEIGHT BEARING RESTRICTIONS: No   FALLS:  Has patient fallen in last 6 months? no   LIVING ENVIRONMENT: Lives with: lives alone Lives in: House/apartment Stairs: No Has following equipment at home: None   OCCUPATION: retired   PLOF: Independent   PATIENT GOALS: get better sleep and quality of life   NEXT MD VISIT: unclear   OBJECTIVE:  Note: Objective measures were completed at Evaluation unless otherwise noted.   DIAGNOSTIC FINDINGS:  Per referring MD notes: Foraminal narrowing L5/ S1 L.  Multilevel degenerative changes L2 to S1.  No instability noted with flex, ext radiographs   PATIENT SURVEYS:  Modified Oswestry Low Back Pain Disability Questionnaire: 17 / 50 = 34.0 %   COGNITION: Overall cognitive status: Within functional limits for tasks assessed                          SENSATION: WFL   MUSCLE LENGTH: Hamstrings: Right wnl deg; Left wnl deg Debby test: Right wfl deg; Left wfl deg   POSTURE: R iliac crest elevated with some concavity R lumbar region, loss of lumbar lordosis , loss of gluteal mass B   PALPATION: Exquisitely tender L posterior SI jt line and L glut max, glut med, piriformis   LUMBAR ROM:    AROM eval  Flexion 50  Extension 30  Right lateral flexion 20  Left lateral flexion 20  Right rotation    Left rotation     (Blank rows = not tested)   LOWER EXTREMITY ROM:      Active  Right eval Left eval  Hip flexion      Hip extension      Hip abduction      Hip adduction      Hip internal rotation wfl Wfl but p!  Hip external rotation -15 -15  Knee flexion      Knee extension      Ankle dorsiflexion      Ankle plantarflexion      Ankle inversion      Ankle eversion       (Blank rows = not  tested)   LOWER EXTREMITY MMT:     MMT Right eval Left eval  Hip flexion      Hip extension      Hip abduction 4 4  Hip adduction      Hip internal rotation 3+ 2+  Hip external rotation 4+ 4+  Knee flexion 4+ 4+  Knee extension 4_+ 4+  Ankle dorsiflexion      Ankle plantarflexion toe walking unable unable  Ankle inversion      Ankle eversion       (Blank rows = not tested)   LUMBAR  SPECIAL TESTS:  Straight leg raise test: Negative   FUNCTIONAL TESTS:  30 seconds chair stand test  9   GAIT: Distance walked: in clinic up to 90' Assistive device utilized: None Level of assistance: Complete Independence     TREATMENT DATE:  04/24/24:  Side lying on R for deep cross friction massage L post hip musculature, with theragun primarily Revised home exercise program to target her lower lumbar spine and improve her mobility:  Supine LTR, added bolster under feet to achieve more hip flexion and thereby isolate the lower lumbar spine Seated lumbar flexion stretch with hands on swifter handle, with rotation at end range  Standing with one foot on step stool for lumbar flexion, sliding hands on outside of propped foot to knee, to achieve lumbar flex, rot, SB stretch  04/10/24                                                                                                                              Eval, POC, inst in therex / stretches as described below:  Standing B heel raises with t band around ankles to engage B hip IR strength Attempted supine LTR with thighs over physioball but did not tolerate position well, too tender with lying with L SI area on table     PATIENT EDUCATION:  Education details: POC, goals Person educated: Patient Education method: Explanation, Demonstration, Tactile cues, and Verbal cues Education comprehension: verbalized understanding, returned demonstration, verbal cues required, tactile cues required, and needs further education   HOME EXERCISE  PROGRAM: Access Code: [email or web address] URL: [email or web address] Date: 04/10/2024 Prepared by: Lonie Rummell   Exercises - Sidelying Lumbar Rotation Stretch  - 1 x daily - 7 x weekly - 3 sets - 10 reps   ASSESSMENT:   CLINICAL IMPRESSION: 04/24/24: Today she was not experiencing much of a change in her Sx.  Was unable to tolerate the initially instructed side lying lumbar and thoracic rotation stretch due to L shoulder pain. So we revamped her home program, again with emphasis on more specifically isolating her L lower lumbar spine, L 4/5 and L 5/S1 region.  She tolerated these stretches well.  She will utilize again over the next week and evaluate whether a specific change in her symptoms occurs.  Also provided her with information regarding TENS units and TPDN.  She does still have muscular pain with palpation of L post hip musculature, so we utilized the theragun to address.will continue to address her chronic lower lumbar radicular pain with techniques to decompress her spine and also improve her hips strength deficits and lumbar mobility.  Patient is a 79 y.o. female who was evaluated today by physical therapy for chronic lower back pain and L L5 S1 radiculopathy. She presents with stiffness throughout lumbar spine and B hips, as well as B hip weakness especially for hip IR.  Today we addressed specific stretch to open L L5 S1 facet jt as well as initiated  hip IR strengthening .  She may benefit from manual techniques to improve her gluteal and L post hip musculature tenderness /  also may benefit from a TENS trial   OBJECTIVE IMPAIRMENTS: decreased activity tolerance, decreased balance, decreased endurance, decreased mobility, difficulty walking, decreased strength, impaired perceived functional ability, postural dysfunction, and pain.    ACTIVITY LIMITATIONS: carrying, lifting, bending, squatting, sleeping, hygiene/grooming, and locomotion level   PARTICIPATION LIMITATIONS: meal prep,  cleaning, laundry, shopping, and community activity   PERSONAL FACTORS: Behavior pattern, Past/current experiences, Time since onset of injury/illness/exacerbation, and 1-2 comorbidities: osteoarthritis multiple sites,  are also affecting patient's functional outcome.    REHAB POTENTIAL: Good   CLINICAL DECISION MAKING: Evolving/moderate complexity   EVALUATION COMPLEXITY: Moderate     GOALS: Goals reviewed with patient? Yes   SHORT TERM GOALS: Target date: 2 weeks, 04/23/24   I HEP Baseline: Goal status: INITIAL   LONG TERM GOALS: Target date: 06/05/24, 8 weeks    Modified oswestry improve from 17 / 50 = 34.0 % to 7 50 or less Baseline:  Goal status: INITIAL   2.  Strength B hips for IR, Abd improve to 4_+/5 without pain Baseline:  Goal status: INITIAL   3.  Improve LS spine flex, ext, side bending motion to 50 % all planes for improve static positioning in bed Baseline:  Goal status: INITIAL   4.  30 sec sit to stand greater than 11 reps improve from 9 Baseline:  Goal status: INITIAL   PLAN:   PT FREQUENCY: 1x/week   PT DURATION: 8 weeks   PLANNED INTERVENTIONS: 97110-Therapeutic exercises, 97530- Therapeutic activity, [id number]- Neuromuscular re-education, 97535- Self Care, [id number]- Manual therapy, and Patient/Family education.   PLAN FOR NEXT SESSION: assess strength B hips and flexibility LS spine, manual techniques, theragun,      Trevontae Lindahl L Everli Rother, PT, DPT< OCS 04/10/2024, 4:40 PM

## 2024-04-30 ENCOUNTER — Other Ambulatory Visit (HOSPITAL_BASED_OUTPATIENT_CLINIC_OR_DEPARTMENT_OTHER): Payer: Self-pay

## 2024-05-03 ENCOUNTER — Ambulatory Visit

## 2024-05-11 ENCOUNTER — Encounter: Payer: Self-pay | Admitting: Rehabilitation

## 2024-05-11 ENCOUNTER — Ambulatory Visit: Admitting: Rehabilitation

## 2024-05-11 DIAGNOSIS — M6281 Muscle weakness (generalized): Secondary | ICD-10-CM

## 2024-05-11 DIAGNOSIS — M25652 Stiffness of left hip, not elsewhere classified: Secondary | ICD-10-CM

## 2024-05-11 DIAGNOSIS — G8929 Other chronic pain: Secondary | ICD-10-CM | POA: Diagnosis not present

## 2024-05-11 DIAGNOSIS — M25651 Stiffness of right hip, not elsewhere classified: Secondary | ICD-10-CM

## 2024-05-17 NOTE — Therapy (Signed)
 OUTPATIENT PHYSICAL THERAPY THORACOLUMBAR TREATMENT     Patient Name: Felicia Hall MRN: [id number] DOB:1945-08-17, 79 y.o., female Today's Date: 04/10/2024   END OF SESSION:           PT End of Session - 04/24/24      Visit Number 2    Date for Recertification  06/05/24     Progress Note Due on Visit 10     PT Start Time 1100    PT Stop Time 1145    PT Time Calculation (min) 45 min     Activity Tolerance Patient tolerated treatment well     Behavior During Therapy [facility] for tasks assessed/performed                  Past Medical History:  Diagnosis Date   Acute upper respiratory infections of unspecified site     Allergic rhinitis due to pollen     Arthritis     Benign essential hypertension     Bursitis     Cervical spondylosis without myelopathy     Cervicalgia     Controlled type 2 diabetes mellitus without complication, without long-term current use of insulin  (HCC) 10/01/2019   Diaphragmatic hernia without mention of obstruction or gangrene     Disorder of bone and cartilage, unspecified     Diverticulosis of colon (without mention of hemorrhage)     Encounter for long-term (current) use of other medications     GERD (gastroesophageal reflux disease)     Glaucoma      open angle   High blood sugar     History of bone density study     History of colonoscopy     History of echocardiogram     History of hiatal hernia     History of Papanicolaou smear of cervix     Hyperlipidemia LDL goal < 100     Hypertension     Keratoconjunctivitis sicca, 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's     Other and unspecified hyperlipidemia     Pain in joint, site unspecified     Pre-diabetes     Routine general medical examination at a health care facility     Sinusitis     Sjogren's syndrome 10/27/2012   Tear film insufficiency, unspecified     Unspecified disorder of kidney and ureter     Unspecified glaucoma(365.9)               Past Surgical History:  Procedure Laterality  Date   BACK SURGERY       BELPHAROPTOSIS REPAIR        brow lift   bil   CATARACT EXTRACTION W/ INTRAOCULAR LENS  IMPLANT, BILATERAL Bilateral 12/2014    Dr. 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   Overactive bladder 04/21/2023   SUI (stress urinary incontinence, female) 04/21/2023   Vaginal atrophy 04/21/2023   Impacted cerumen  of left ear 12/03/2022   Controlled type 2 diabetes mellitus without complication, without long-term current use of insulin  (HCC) 10/01/2019   Trochanteric bursitis 10/01/2019   Vertigo 10/01/2019   Palpitations 10/01/2019   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   Benign essential tremor 01/03/2017   Irritable bowel syndrome with constipation 08/09/2016   Neck pain, chronic 08/09/2016   Spinal stenosis in cervical region 08/09/2016   Generalized constriction of visual field 06/22/2016   Chronic pansinusitis 10/17/2015   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   Perennial allergic rhinitis 10/17/2015   Chronic pain syndrome 04/30/2013   Hyperglycemia 04/30/2013   Low back pain with right-sided sciatica 04/30/2013   Osteopenia, 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   Family history of colonic polyps 04/30/2013   Sjogren's syndrome 10/27/2012   Keratoconjunctivitis sicca, 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's     Encounter for long-term (current) use of other medications     Disorder of bone and cartilage     Allergic  rhinitis due to pollen     Cervical spondylosis without myelopathy     Unspecified glaucoma     Benign essential hypertension     Mixed hyperlipidemia        PCP: Gil Greig DRAFTS NP   REFERRING PROVIDER: Louis Shove, MD,    REFERRING DIAG: Lumbar radiculopathy , with spondylosis   Rationale for Evaluation and Treatment: Rehabilitation   THERAPY DIAG:  Chronic bilateral low back pain with left-sided sciatica   Stiffness of left hip, not elsewhere classified   Stiffness of right hip, not elsewhere classified   Muscle weakness (generalized)   ONSET DATE: several years    SUBJECTIVE:                                                                                                                                                                                            SUBJECTIVE STATEMENT:  Patient reports she can't do any of her HEP because it increases her pain.   She feels she is doing worse since starting PT.  States her pain is now in both hips whereas before it was only in the L.  Rates pain today 2/10    PERTINENT HISTORY:  Chronic LS spine pain and radicular pain L post hips   PAIN:  Are you having pain? Yes: NPRS scale: 2 to 8 Pain location: central lumbar, and L gluteals, SITS bone Pain description: deep pain. burn Aggravating factors: lying down aggravates Relieving factors: tylenol  arthritis   PRECAUTIONS: None   RED FLAGS: None      WEIGHT BEARING RESTRICTIONS: No   FALLS:  Has patient fallen in last 6 months? no   LIVING ENVIRONMENT: Lives with: lives alone Lives in: House/apartment Stairs: No Has following equipment at home: None   OCCUPATION: retired   PLOF: Independent   PATIENT GOALS: get better sleep and quality of life   NEXT MD VISIT: unclear   OBJECTIVE:  Note: Objective measures were completed at Evaluation unless otherwise noted.   DIAGNOSTIC FINDINGS:  Per referring MD notes: Foraminal narrowing L5/ S1 L.  Multilevel degenerative  changes L2 to S1.  No instability noted with flex, ext radiographs   PATIENT SURVEYS:  Modified Oswestry Low Back Pain Disability Questionnaire: 17 / 50 = 34.0 %   COGNITION: Overall cognitive status: Within functional limits for tasks assessed                          SENSATION: WFL   MUSCLE LENGTH: Hamstrings: Right wnl deg; Left wnl deg Debby test: Right wfl deg; Left wfl deg   POSTURE: R iliac crest elevated with some concavity R lumbar region, loss of lumbar lordosis , loss of gluteal mass B   PALPATION: Exquisitely tender L posterior SI jt line and L glut max, glut med, piriformis   LUMBAR ROM:    AROM eval  Flexion 50  Extension 30  Right lateral flexion 20  Left lateral flexion 20  Right rotation    Left rotation     (Blank rows = not tested)   LOWER EXTREMITY ROM:      Active  Right eval Left eval  Hip flexion      Hip extension      Hip abduction      Hip adduction      Hip internal rotation wfl Wfl but p!  Hip external rotation -15 -15  Knee flexion      Knee extension      Ankle dorsiflexion      Ankle plantarflexion      Ankle inversion      Ankle eversion       (Blank rows = not tested)   LOWER EXTREMITY MMT:     MMT Right eval Left eval  Hip flexion      Hip extension      Hip abduction 4 4  Hip adduction      Hip internal rotation 3+ 2+  Hip external rotation 4+ 4+  Knee flexion 4+ 4+  Knee extension 4_+ 4+  Ankle dorsiflexion      Ankle plantarflexion toe walking unable unable  Ankle inversion      Ankle eversion       (Blank rows = not tested)   LUMBAR SPECIAL TESTS:  Straight leg raise test: Negative   FUNCTIONAL TESTS:  30 seconds chair stand test  9   GAIT: Distance walked: in clinic up to 90' Assistive device utilized: None Level of assistance: Complete Independence     TREATMENT DATE:  05/11/24 THERAPEUTIC EXERCISE: To improve strength and endurance.  Demonstration, verbal and tactile cues throughout for  technique. NuStep L3 x 6'  Seated Swiss ball rollouts w/ 65 cm to 11:00, 12:00, 1:00 x 6 each way--increased back pain so had to stop Seated KTOS piriformis stretch x 1' x 3 BLE Seated FABER stretch x 1' x 3 BLE Seated hip flexor stretch x 1' x 2 BLE Seated hip ER GTB x 2/10 BLE Standing gentle hip extension (not to end range--only to glut contraction) x 10 BLE  MANUAL  THERAPY: To promote reduced pain utilizing myofascial release and percussion massage with massage gun. Percussion massage to L piriformis;  manual MFR to L IT band;  foam roller MFR to L IT band  MODALITIES: Interferential electrical stimulation at machine default settings x 100% scan x 24mA x 15' to L hip  04/24/24:  Side lying on R for deep cross friction massage L post hip musculature, with theragun primarily Revised home exercise program to target her lower lumbar spine and improve her mobility:  Supine LTR, added bolster under feet to achieve more hip flexion and thereby isolate the lower lumbar spine Seated lumbar flexion stretch with hands on swifter handle, with rotation at end range  Standing with one foot on step stool for lumbar flexion, sliding hands on outside of propped foot to knee, to achieve lumbar flex, rot, SB stretch  04/10/24                                                                                                                              Eval, POC, inst in therex / stretches as described below:  Standing B heel raises with t band around ankles to engage B hip IR strength Attempted supine LTR with thighs over physioball but did not tolerate position well, too tender with lying with L SI area on table     PATIENT EDUCATION:  Education details: POC, goals Person educated: Patient Education method: Explanation, Demonstration, Tactile cues, and Verbal cues Education comprehension: verbalized understanding, returned demonstration, verbal cues required, tactile cues required, and needs further  education   HOME EXERCISE PROGRAM: Access Code: [email or web address] URL: [email or web address] Date: 05/11/2024 Prepared by: Garnette Montclair  Exercises - Seated Figure 4 Piriformis Stretch  - 1 x daily - 7 x weekly - 1 sets - 2 reps - 1 min hold - Seated Piriformis Stretch  - 1 x daily - 7 x weekly - 1 sets - 2 reps - 1 min hold - Seated Hamstring Stretch  - 1 x daily - 7 x weekly - 1 sets - 2 reps - 1 min hold - Seated Hip Flexor Stretch  - 1 x daily - 7 x weekly - 1 sets - 2 reps - 1 min hold - Seated Hip Abduction with Resistance  - 1 x daily - 7 x weekly - 2 sets - 10 reps - Standing Shoulder Row with Anchored Resistance  - 1 x daily - 7 x weekly - 3 sets - 10 reps  ASSESSMENT:   CLINICAL IMPRESSION:  Patient is having more pain today and any trunk/lumbar flexion and/or rotation increases her pain.  She can't tolerate supine positions or any L/S rotation.   She has pretty tight piriformis and IT so we worked a lot today on these areas and some of our treatment was painful .  Her HEP is updated and she is advised to try doing some of it each day.   If we could see her  more than twice a month it would be easier for us  to adjust her exercises, but hopefully she will tolerate the new ones from today a little better.  She is encouraged to see if she can come in a little more frequently, but the cost is unforunately a concern.   PT remains necessary for pain, gait, strength, ROM, flexibility deficits.   Continue per POC.    EVAL:  Patient is a 79 y.o. female who was evaluated today by physical therapy for chronic lower back pain and L L5 S1 radiculopathy. She presents with stiffness throughout lumbar spine and B hips, as well as B hip weakness especially for hip IR.  Today we addressed specific stretch to open L L5 S1 facet jt as well as initiated hip IR strengthening .  She may benefit from manual techniques to improve her gluteal and L post hip musculature tenderness /  also may benefit  from a TENS trial   OBJECTIVE IMPAIRMENTS: decreased activity tolerance, decreased balance, decreased endurance, decreased mobility, difficulty walking, decreased strength, impaired perceived functional ability, postural dysfunction, and pain.    ACTIVITY LIMITATIONS: carrying, lifting, bending, squatting, sleeping, hygiene/grooming, and locomotion level   PARTICIPATION LIMITATIONS: meal prep, cleaning, laundry, shopping, and community activity   PERSONAL FACTORS: Behavior pattern, Past/current experiences, Time since onset of injury/illness/exacerbation, and 1-2 comorbidities: osteoarthritis multiple sites,  are also affecting patient's functional outcome.    REHAB POTENTIAL: Good   CLINICAL DECISION MAKING: Evolving/moderate complexity   EVALUATION COMPLEXITY: Moderate     GOALS: Goals reviewed with patient? Yes   SHORT TERM GOALS: Target date: 2 weeks, 04/23/24   I HEP Baseline: Goal status: INITIAL   LONG TERM GOALS: Target date: 06/05/24, 8 weeks    Modified oswestry improve from 17 / 50 = 34.0 % to 7 50 or less Baseline:  Goal status: INITIAL   2.  Strength B hips for IR, Abd improve to 4_+/5 without pain Baseline:  Goal status: INITIAL   3.  Improve LS spine flex, ext, side bending motion to 50 % all planes for improve static positioning in bed Baseline:  Goal status: INITIAL   4.  30 sec sit to stand greater than 11 reps improve from 9 Baseline:  Goal status: INITIAL   PLAN:   PT FREQUENCY: 1x/week   PT DURATION: 8 weeks   PLANNED INTERVENTIONS: 97110-Therapeutic exercises, 97530- Therapeutic activity, [id number]- Neuromuscular re-education, 97535- Self Care, [id number]- Manual therapy, and Patient/Family education.   PLAN FOR NEXT SESSION: see how HEP is doing and if it is increasing her pain;  maybe try some step stance rocking for lumbar flexion?; see if IT and piriformis manual treatments helped last time;  may try some sidelying manual with LLE on a stool for  rolling into flexion     Garnette Montclair, PT 05/17/2024, 5:02 PM  [facility] [address]  [address] [place], [place], [address] Phone: [phone]   Fax:  [phone]

## 2024-05-18 ENCOUNTER — Ambulatory Visit: Admitting: Rehabilitation

## 2024-05-18 ENCOUNTER — Encounter: Payer: Self-pay | Admitting: Rehabilitation

## 2024-05-18 DIAGNOSIS — M25652 Stiffness of left hip, not elsewhere classified: Secondary | ICD-10-CM

## 2024-05-18 DIAGNOSIS — M25651 Stiffness of right hip, not elsewhere classified: Secondary | ICD-10-CM

## 2024-05-18 DIAGNOSIS — M6281 Muscle weakness (generalized): Secondary | ICD-10-CM

## 2024-05-18 DIAGNOSIS — G8929 Other chronic pain: Secondary | ICD-10-CM

## 2024-05-28 ENCOUNTER — Ambulatory Visit: Admitting: Rehabilitation

## 2024-09-11 ENCOUNTER — Other Ambulatory Visit

## 2024-09-13 ENCOUNTER — Ambulatory Visit: Admitting: Orthopedic Surgery

## 2024-10-04 ENCOUNTER — Ambulatory Visit: Payer: Self-pay | Admitting: Cardiology
